# Patient Record
Sex: Female | Born: 1955
Health system: Southern US, Community
[De-identification: ages and names within clinical notes are randomized; demographics above are authoritative.]

## PROBLEM LIST (undated history)

## (undated) DIAGNOSIS — J449 Chronic obstructive pulmonary disease, unspecified: Secondary | ICD-10-CM

## (undated) DIAGNOSIS — J45909 Unspecified asthma, uncomplicated: Secondary | ICD-10-CM

## (undated) DIAGNOSIS — E039 Hypothyroidism, unspecified: Secondary | ICD-10-CM

## (undated) DIAGNOSIS — I2510 Atherosclerotic heart disease of native coronary artery without angina pectoris: Secondary | ICD-10-CM

## (undated) DIAGNOSIS — E785 Hyperlipidemia, unspecified: Secondary | ICD-10-CM

## (undated) DIAGNOSIS — N951 Menopausal and female climacteric states: Secondary | ICD-10-CM

## (undated) DIAGNOSIS — I10 Essential (primary) hypertension: Secondary | ICD-10-CM

## (undated) DIAGNOSIS — K219 Gastro-esophageal reflux disease without esophagitis: Secondary | ICD-10-CM

## (undated) DIAGNOSIS — K760 Fatty (change of) liver, not elsewhere classified: Secondary | ICD-10-CM

## (undated) DIAGNOSIS — R072 Precordial pain: Secondary | ICD-10-CM

## (undated) DIAGNOSIS — K589 Irritable bowel syndrome without diarrhea: Secondary | ICD-10-CM

## (undated) DIAGNOSIS — E119 Type 2 diabetes mellitus without complications: Secondary | ICD-10-CM

## (undated) HISTORY — DX: Fatty (change of) liver, not elsewhere classified: K76.0

## (undated) HISTORY — PX: SQUAMOUS CELL CARCINOMA EXCISION: SHX2433

## (undated) HISTORY — DX: Type 2 diabetes mellitus without complications: E11.9

## (undated) HISTORY — PX: NASAL SINUS SURGERY: SHX719

## (undated) HISTORY — PX: OTHER SURGICAL HISTORY: SHX169

## (undated) HISTORY — DX: Gastro-esophageal reflux disease without esophagitis: K21.9

## (undated) HISTORY — DX: Precordial pain: R07.2

## (undated) HISTORY — DX: Hyperlipidemia, unspecified: E78.5

## (undated) HISTORY — PX: ABLATION: SHX5711

## (undated) HISTORY — DX: Chronic obstructive pulmonary disease, unspecified: J44.9

## (undated) HISTORY — DX: Irritable bowel syndrome, unspecified: K58.9

## (undated) HISTORY — PX: TUBAL LIGATION: SHX77

## (undated) HISTORY — DX: Hypothyroidism, unspecified: E03.9

## (undated) HISTORY — DX: Menopausal and female climacteric states: N95.1

---

## 2006-09-30 ENCOUNTER — Encounter: Payer: Self-pay | Admitting: Family Medicine

## 2006-09-30 LAB — CONVERTED CEMR LAB
Cholesterol: 182 mg/dL
HDL: 41 mg/dL

## 2007-01-05 ENCOUNTER — Encounter: Payer: Self-pay | Admitting: Family Medicine

## 2007-01-05 HISTORY — PX: SPIROMETRY: SHX456

## 2007-01-05 LAB — CONVERTED CEMR LAB
AST: 16 units/L
BUN: 13 mg/dL
CO2: 23 meq/L
Calcium: 9.2 mg/dL
Glucose, Bld: 96 mg/dL
Potassium: 4.2 meq/L
Sodium: 141 meq/L
TSH: 1.355 microintl units/mL

## 2007-06-13 ENCOUNTER — Encounter: Admission: RE | Admit: 2007-06-13 | Discharge: 2007-06-13 | Payer: Self-pay | Admitting: Family Medicine

## 2007-06-13 ENCOUNTER — Ambulatory Visit: Payer: Self-pay | Admitting: Family Medicine

## 2007-06-13 DIAGNOSIS — N390 Urinary tract infection, site not specified: Secondary | ICD-10-CM

## 2007-06-13 DIAGNOSIS — E039 Hypothyroidism, unspecified: Secondary | ICD-10-CM

## 2007-06-13 HISTORY — DX: Hypothyroidism, unspecified: E03.9

## 2007-06-13 HISTORY — DX: Urinary tract infection, site not specified: N39.0

## 2007-06-13 LAB — CONVERTED CEMR LAB
Glucose, Urine, Semiquant: NEGATIVE
Ketones, urine, test strip: NEGATIVE
Nitrite: NEGATIVE
Specific Gravity, Urine: 1.01
WBC Urine, dipstick: NEGATIVE

## 2007-06-14 ENCOUNTER — Telehealth (INDEPENDENT_AMBULATORY_CARE_PROVIDER_SITE_OTHER): Payer: Self-pay | Admitting: *Deleted

## 2007-06-14 ENCOUNTER — Encounter: Payer: Self-pay | Admitting: Family Medicine

## 2007-06-18 ENCOUNTER — Encounter: Payer: Self-pay | Admitting: Family Medicine

## 2007-06-28 ENCOUNTER — Encounter: Payer: Self-pay | Admitting: Family Medicine

## 2007-06-28 LAB — CONVERTED CEMR LAB: TSH: 1.481 microintl units/mL (ref 0.350–5.50)

## 2007-06-29 ENCOUNTER — Telehealth (INDEPENDENT_AMBULATORY_CARE_PROVIDER_SITE_OTHER): Payer: Self-pay | Admitting: *Deleted

## 2007-08-01 ENCOUNTER — Ambulatory Visit: Payer: Self-pay | Admitting: Family Medicine

## 2007-08-01 ENCOUNTER — Encounter: Payer: Self-pay | Admitting: Family Medicine

## 2007-08-01 ENCOUNTER — Other Ambulatory Visit: Admission: RE | Admit: 2007-08-01 | Discharge: 2007-08-01 | Payer: Self-pay | Admitting: Family Medicine

## 2007-08-01 DIAGNOSIS — L719 Rosacea, unspecified: Secondary | ICD-10-CM

## 2007-08-01 HISTORY — DX: Rosacea, unspecified: L71.9

## 2007-08-06 ENCOUNTER — Encounter: Admission: RE | Admit: 2007-08-06 | Discharge: 2007-08-06 | Payer: Self-pay | Admitting: Family Medicine

## 2007-09-10 ENCOUNTER — Ambulatory Visit: Payer: Self-pay | Admitting: Family Medicine

## 2007-11-02 ENCOUNTER — Ambulatory Visit: Payer: Self-pay | Admitting: Family Medicine

## 2007-11-02 DIAGNOSIS — J449 Chronic obstructive pulmonary disease, unspecified: Secondary | ICD-10-CM

## 2007-11-02 DIAGNOSIS — K219 Gastro-esophageal reflux disease without esophagitis: Secondary | ICD-10-CM

## 2007-11-02 DIAGNOSIS — J441 Chronic obstructive pulmonary disease with (acute) exacerbation: Secondary | ICD-10-CM

## 2007-11-02 HISTORY — DX: Chronic obstructive pulmonary disease, unspecified: J44.9

## 2007-11-02 HISTORY — DX: Chronic obstructive pulmonary disease with (acute) exacerbation: J44.1

## 2007-11-02 HISTORY — DX: Gastro-esophageal reflux disease without esophagitis: K21.9

## 2008-02-04 ENCOUNTER — Ambulatory Visit: Payer: Self-pay | Admitting: Family Medicine

## 2008-02-05 ENCOUNTER — Encounter: Payer: Self-pay | Admitting: Family Medicine

## 2008-02-05 LAB — CONVERTED CEMR LAB
ALT: 32 units/L (ref 0–35)
AST: 23 units/L (ref 0–37)
BUN: 11 mg/dL (ref 6–23)
CO2: 23 meq/L (ref 19–32)
Creatinine, Ser: 0.75 mg/dL (ref 0.40–1.20)
HDL: 46 mg/dL (ref >39)
Sodium: 141 meq/L (ref 135–145)
TSH: 3.799 microintl units/mL (ref 0.350–5.50)
Triglycerides: 150 mg/dL — ABNORMAL HIGH (ref <150)

## 2008-02-25 ENCOUNTER — Ambulatory Visit: Payer: Self-pay | Admitting: Family Medicine

## 2008-02-25 DIAGNOSIS — J309 Allergic rhinitis, unspecified: Secondary | ICD-10-CM

## 2008-02-25 HISTORY — DX: Allergic rhinitis, unspecified: J30.9

## 2008-02-25 LAB — CONVERTED CEMR LAB
Bilirubin Urine: NEGATIVE
Glucose, Urine, Semiquant: NEGATIVE
Ketones, urine, test strip: NEGATIVE
Specific Gravity, Urine: <1.005
Urobilinogen, UA: 0.2

## 2008-03-10 ENCOUNTER — Encounter: Admission: RE | Admit: 2008-03-10 | Discharge: 2008-03-10 | Payer: Self-pay | Admitting: Family Medicine

## 2008-03-10 ENCOUNTER — Telehealth: Payer: Self-pay | Admitting: Family Medicine

## 2008-03-11 ENCOUNTER — Encounter: Payer: Self-pay | Admitting: Family Medicine

## 2008-03-15 ENCOUNTER — Encounter: Payer: Self-pay | Admitting: Family Medicine

## 2008-03-20 ENCOUNTER — Telehealth: Payer: Self-pay | Admitting: Family Medicine

## 2008-03-20 ENCOUNTER — Encounter: Admission: RE | Admit: 2008-03-20 | Discharge: 2008-03-20 | Payer: Self-pay | Admitting: Family Medicine

## 2008-03-31 ENCOUNTER — Encounter: Payer: Self-pay | Admitting: Family Medicine

## 2008-04-01 ENCOUNTER — Encounter: Payer: Self-pay | Admitting: Family Medicine

## 2008-04-01 LAB — CONVERTED CEMR LAB
Basophils Relative: 0 % (ref 0–1)
HCT: 44.1 % (ref 36.0–46.0)
Hemoglobin: 13.8 g/dL (ref 12.0–15.0)
Lymphocytes Relative: 29 % (ref 12–46)
Lymphs Abs: 2.6 10*3/uL (ref 0.7–4.0)
MCHC: 31.3 g/dL (ref 30.0–36.0)
MCV: 97.1 fL (ref 78.0–100.0)
Monocytes Absolute: 0.8 10*3/uL (ref 0.1–1.0)
Neutro Abs: 5.5 10*3/uL (ref 1.7–7.7)
Neutrophils Relative %: 60 % (ref 43–77)
Platelets: 303 10*3/uL (ref 150–400)
RBC: 4.54 M/uL (ref 3.87–5.11)
RDW: 13.8 % (ref 11.5–15.5)

## 2008-05-22 ENCOUNTER — Ambulatory Visit: Payer: Self-pay | Admitting: Family Medicine

## 2008-05-22 LAB — CONVERTED CEMR LAB
Ketones, urine, test strip: NEGATIVE
Nitrite: NEGATIVE
WBC Urine, dipstick: NEGATIVE

## 2008-05-23 ENCOUNTER — Encounter: Admission: RE | Admit: 2008-05-23 | Discharge: 2008-05-23 | Payer: Self-pay | Admitting: Family Medicine

## 2008-05-23 LAB — CONVERTED CEMR LAB
ALT: 47 units/L — ABNORMAL HIGH (ref 0–35)
AST: 27 units/L (ref 0–37)
Amylase: 42 units/L (ref 0–105)
BUN: 10 mg/dL (ref 6–23)
Basophils Absolute: 0 10*3/uL (ref 0.0–0.1)
Basophils Relative: 0 % (ref 0–1)
Creatinine, Ser: 0.8 mg/dL (ref 0.40–1.20)
Eosinophils Relative: 7 % — ABNORMAL HIGH (ref 0–5)
Glucose, Bld: 104 mg/dL — ABNORMAL HIGH (ref 70–99)
HCT: 45.7 % (ref 36.0–46.0)
Lymphocytes Relative: 29 % (ref 12–46)
Lymphs Abs: 2.7 10*3/uL (ref 0.7–4.0)
MCHC: 31.7 g/dL (ref 30.0–36.0)
MCV: 97.4 fL (ref 78.0–100.0)
Monocytes Absolute: 0.8 10*3/uL (ref 0.1–1.0)
Neutro Abs: 5.1 10*3/uL (ref 1.7–7.7)
Neutrophils Relative %: 55 % (ref 43–77)
Platelets: 271 10*3/uL (ref 150–400)
Potassium: 4.2 meq/L (ref 3.5–5.3)
RBC: 4.69 M/uL (ref 3.87–5.11)
RDW: 14 % (ref 11.5–15.5)
Total Bilirubin: 0.5 mg/dL (ref 0.3–1.2)
WBC: 9.2 10*3/uL (ref 4.0–10.5)

## 2008-05-26 ENCOUNTER — Telehealth: Payer: Self-pay | Admitting: Family Medicine

## 2008-05-27 ENCOUNTER — Encounter: Payer: Self-pay | Admitting: Family Medicine

## 2008-05-27 ENCOUNTER — Telehealth: Payer: Self-pay | Admitting: Family Medicine

## 2008-08-06 ENCOUNTER — Ambulatory Visit: Payer: Self-pay | Admitting: Family Medicine

## 2008-08-06 ENCOUNTER — Other Ambulatory Visit: Admission: RE | Admit: 2008-08-06 | Discharge: 2008-08-06 | Payer: Self-pay | Admitting: Family Medicine

## 2008-08-06 ENCOUNTER — Encounter: Payer: Self-pay | Admitting: Family Medicine

## 2008-08-06 DIAGNOSIS — N920 Excessive and frequent menstruation with regular cycle: Secondary | ICD-10-CM | POA: Insufficient documentation

## 2008-08-06 DIAGNOSIS — K7689 Other specified diseases of liver: Secondary | ICD-10-CM | POA: Insufficient documentation

## 2008-08-06 HISTORY — DX: Other specified diseases of liver: K76.89

## 2008-08-06 LAB — CONVERTED CEMR LAB
Bilirubin Urine: NEGATIVE
Blood in Urine, dipstick: NEGATIVE
Glucose, Urine, Semiquant: NEGATIVE
Protein, U semiquant: NEGATIVE
Specific Gravity, Urine: <1.005

## 2008-08-08 LAB — CONVERTED CEMR LAB: AST: 32 units/L (ref 0–37)

## 2008-10-06 ENCOUNTER — Ambulatory Visit: Payer: Self-pay | Admitting: Family Medicine

## 2008-11-26 ENCOUNTER — Ambulatory Visit: Payer: Self-pay | Admitting: Cardiology

## 2008-12-02 ENCOUNTER — Encounter: Admission: RE | Admit: 2008-12-02 | Discharge: 2008-12-02 | Payer: Self-pay | Admitting: Cardiology

## 2008-12-08 ENCOUNTER — Inpatient Hospital Stay (HOSPITAL_BASED_OUTPATIENT_CLINIC_OR_DEPARTMENT_OTHER): Admission: RE | Admit: 2008-12-08 | Discharge: 2008-12-08 | Payer: Self-pay | Admitting: Cardiology

## 2008-12-08 ENCOUNTER — Ambulatory Visit: Payer: Self-pay | Admitting: Cardiology

## 2008-12-24 ENCOUNTER — Encounter: Payer: Self-pay | Admitting: Cardiology

## 2008-12-24 ENCOUNTER — Ambulatory Visit: Payer: Self-pay | Admitting: Cardiology

## 2008-12-24 DIAGNOSIS — E7800 Pure hypercholesterolemia, unspecified: Secondary | ICD-10-CM | POA: Insufficient documentation

## 2008-12-24 DIAGNOSIS — I10 Essential (primary) hypertension: Secondary | ICD-10-CM | POA: Insufficient documentation

## 2008-12-24 HISTORY — DX: Essential (primary) hypertension: I10

## 2008-12-24 HISTORY — DX: Pure hypercholesterolemia, unspecified: E78.00

## 2009-05-13 ENCOUNTER — Ambulatory Visit: Payer: Self-pay | Admitting: Family Medicine

## 2009-05-13 DIAGNOSIS — N959 Unspecified menopausal and perimenopausal disorder: Secondary | ICD-10-CM | POA: Insufficient documentation

## 2009-05-13 HISTORY — DX: Unspecified menopausal and perimenopausal disorder: N95.9

## 2009-08-04 ENCOUNTER — Other Ambulatory Visit: Admission: RE | Admit: 2009-08-04 | Discharge: 2009-08-04 | Payer: Self-pay | Admitting: Family Medicine

## 2009-08-04 ENCOUNTER — Encounter: Payer: Self-pay | Admitting: Family Medicine

## 2009-08-04 ENCOUNTER — Ambulatory Visit: Payer: Self-pay | Admitting: Family Medicine

## 2009-08-05 ENCOUNTER — Encounter: Payer: Self-pay | Admitting: Family Medicine

## 2009-08-06 LAB — CONVERTED CEMR LAB
ALT: 32 units/L (ref 0–35)
Albumin: 4.2 g/dL (ref 3.5–5.2)
Alkaline Phosphatase: 58 units/L (ref 39–117)
BUN: 12 mg/dL (ref 6–23)
CO2: 21 meq/L (ref 19–32)
Calcium: 9.2 mg/dL (ref 8.4–10.5)
Chloride: 105 meq/L (ref 96–112)
Creatinine, Ser: 0.88 mg/dL (ref 0.40–1.20)
Glucose, Bld: 104 mg/dL — ABNORMAL HIGH (ref 70–99)
HDL: 49 mg/dL (ref >39)
LDL Cholesterol: 93 mg/dL (ref 0–99)
Potassium: 4.1 meq/L (ref 3.5–5.3)
TSH: 3.045 microintl units/mL (ref 0.350–4.500)
Total Protein: 6.5 g/dL (ref 6.0–8.3)
Triglycerides: 104 mg/dL (ref <150)

## 2009-08-11 ENCOUNTER — Encounter: Admission: RE | Admit: 2009-08-11 | Discharge: 2009-08-11 | Payer: Self-pay | Admitting: Family Medicine

## 2009-08-23 ENCOUNTER — Ambulatory Visit: Payer: Self-pay | Admitting: Family Medicine

## 2009-08-23 DIAGNOSIS — J069 Acute upper respiratory infection, unspecified: Secondary | ICD-10-CM | POA: Insufficient documentation

## 2009-09-08 ENCOUNTER — Telehealth: Payer: Self-pay | Admitting: Family Medicine

## 2010-01-18 ENCOUNTER — Ambulatory Visit: Payer: Self-pay | Admitting: Family Medicine

## 2010-01-18 DIAGNOSIS — J209 Acute bronchitis, unspecified: Secondary | ICD-10-CM | POA: Insufficient documentation

## 2010-01-18 DIAGNOSIS — R300 Dysuria: Secondary | ICD-10-CM

## 2010-01-18 HISTORY — DX: Acute bronchitis, unspecified: J20.9

## 2010-01-18 LAB — CONVERTED CEMR LAB
Glucose, Urine, Semiquant: 250
Nitrite: NEGATIVE
Protein, U semiquant: NEGATIVE
Urobilinogen, UA: 0.2

## 2010-01-19 ENCOUNTER — Encounter: Payer: Self-pay | Admitting: Family Medicine

## 2010-01-19 LAB — CONVERTED CEMR LAB
Clue Cells Wet Prep HPF POC: NONE SEEN
Trich, Wet Prep: NONE SEEN
Yeast Wet Prep HPF POC: NONE SEEN

## 2010-01-26 ENCOUNTER — Ambulatory Visit: Payer: Self-pay | Admitting: Obstetrics & Gynecology

## 2010-02-08 ENCOUNTER — Encounter: Payer: Self-pay | Admitting: Obstetrics & Gynecology

## 2010-02-08 ENCOUNTER — Encounter: Admission: RE | Admit: 2010-02-08 | Discharge: 2010-02-08 | Payer: Self-pay | Admitting: Obstetrics & Gynecology

## 2010-02-08 LAB — CONVERTED CEMR LAB
HCT: 41.9 % (ref 36.0–46.0)
MCHC: 32.5 g/dL (ref 30.0–36.0)
RBC: 4.36 M/uL (ref 3.87–5.11)
TSH: 2.81 microintl units/mL (ref 0.350–4.500)

## 2010-02-16 ENCOUNTER — Ambulatory Visit: Payer: Self-pay | Admitting: Obstetrics & Gynecology

## 2010-03-03 ENCOUNTER — Ambulatory Visit: Payer: Self-pay | Admitting: Obstetrics & Gynecology

## 2010-04-14 ENCOUNTER — Ambulatory Visit: Payer: Self-pay | Admitting: Obstetrics & Gynecology

## 2010-06-07 ENCOUNTER — Ambulatory Visit (HOSPITAL_COMMUNITY): Admission: RE | Admit: 2010-06-07 | Discharge: 2010-06-07 | Payer: Self-pay | Admitting: Obstetrics & Gynecology

## 2010-06-07 ENCOUNTER — Ambulatory Visit: Payer: Self-pay | Admitting: Obstetrics & Gynecology

## 2010-06-23 ENCOUNTER — Ambulatory Visit: Payer: Self-pay | Admitting: Obstetrics & Gynecology

## 2010-07-18 ENCOUNTER — Ambulatory Visit: Payer: Self-pay | Admitting: Emergency Medicine

## 2010-11-15 ENCOUNTER — Encounter: Payer: Self-pay | Admitting: Family Medicine

## 2010-11-23 NOTE — Assessment & Plan Note (Signed)
Summary: COLD/TM   Vital Signs:  Patient Profile:   55 Years Old Female CC:      Cold & URI symptoms Height:     64 inches Weight:      158 pounds O2 Sat:      100 % O2 treatment:    Room Air Temp:     97.8 degrees F oral Pulse rate:   73 / minute Pulse rhythm:   regular Resp:     16 per minute BP sitting:   123 / 82  (left arm) Cuff size:   regular  Vitals Entered By: Areta Haber CMA (July 18, 2010 2:55 PM)                  Current Allergies: ! BACTRIM ! * AVELOX ! CIPROHistory of Present Illness Chief Complaint: Cold & URI symptoms History of Present Illness: Patient complains of onset of cold symptoms for 8 days.  They have been using Mucinex-D which is helping a little bit but keeping awake.  Has a h/o COPD and asthma.  Also had surgery 2 weeks ago and was intubated so wonders if that is the cause. + sore throat + cough No pleuritic pain + wheezing + nasal congestion + post-nasal drainage No sinus pain/pressure No itchy/red eyes No earache No hemoptysis No SOB No chills/sweats No fever No nausea No vomiting No abdominal pain No diarrhea No skin rashes No fatigue No myalgias No headache   Current Problems: UPPER RESPIRATORY INFECTION, ACUTE (ICD-465.9) ACUTE BRONCHITIS (ICD-466.0) DYSURIA (ICD-788.1) URI (ICD-465.9) PERIMENOPAUSAL SYNDROME (ICD-627.9) HYPERTENSION, BENIGN (ICD-401.1) HYPERCHOLESTEROLEMIA, PURE (ICD-272.0) ISCHEMIC HEART DISEASE, FAMILY HX (ICD-V17.3) ROUTINE GYNECOLOGICAL EXAMINATION (ICD-V72.31) FATTY LIVER DISEASE (ICD-571.8) MENORRHAGIA (ICD-626.2) ALLERGIC RHINITIS (ICD-477.9) GERD (ICD-530.81) COPD, MILD (ICD-496) ROSACEA (ICD-695.3) UTI'S, RECURRENT (ICD-599.0) HYPOTHYROIDISM NOS (ICD-244.9)   Current Meds LEVOXYL 50 MCG  TABS (LEVOTHYROXINE SODIUM) Take 1 tablet by mouth once a day [BMN] PROAIR HFA 108 (90 BASE) MCG/ACT  AERS (ALBUTEROL SULFATE) 1-2 puffs q 6 hrs prn ASPIRIN 81 MG TBEC (ASPIRIN) Take one  tablet by mouth daily LISINOPRIL-HYDROCHLOROTHIAZIDE 10-12.5 MG TABS (LISINOPRIL-HYDROCHLOROTHIAZIDE) take 1 tab by mouth once daily APRI 0.15-30 MG-MCG TABS (DESOGESTREL-ETHINYL ESTRADIOL) 1 tab by mouth daily ZITHROMAX Z-PAK 250 MG TABS (AZITHROMYCIN) take as directed ZITHROMAX Z-PAK 250 MG TABS (AZITHROMYCIN) use as directed TESSALON 200 MG CAPS (BENZONATATE) 1 cap by mouth three times a day as needed for cough CLARITIN-D 24 HOUR 10-240 MG XR24H-TAB (LORATADINE-PSEUDOEPHEDRINE) 1 tab by mouth QAM for 2 wks DAYQUIL MULTI-SYMPTOM 30-325-10 MG/15ML LIQD (PSEUDOEPHEDRINE-APAP-DM) as directed ROBITUSSIN MAXIMUM STRENGTH 15 MG/5ML SYRP (DEXTROMETHORPHAN HBR) as directed MUCINEX D 703 [id number] MG XR12H-TAB (PSEUDOEPHEDRINE-GUAIFENESIN) as directed  REVIEW OF SYSTEMS Constitutional Symptoms      Denies fever, chills, night sweats, weight loss, weight gain, and fatigue.  Eyes       Denies change in vision, eye pain, eye discharge, glasses, contact lenses, and eye surgery. Ear/Nose/Throat/Mouth       Denies hearing loss/aids, change in hearing, ear pain, ear discharge, dizziness, frequent runny nose, frequent nose bleeds, sinus problems, sore throat, hoarseness, and tooth pain or bleeding.  Respiratory       Complains of productive cough and wheezing.      Denies dry cough, shortness of breath, asthma, bronchitis, and emphysema/COPD.      Comments: chest congestion Cardiovascular       Denies murmurs, chest pain, and tires easily with exhertion.    Gastrointestinal       Denies stomach pain, nausea/vomiting, diarrhea,  constipation, blood in bowel movements, and indigestion. Genitourniary       Denies painful urination, kidney stones, and loss of urinary control. Neurological       Denies paralysis, seizures, and fainting/blackouts. Musculoskeletal       Denies muscle pain, joint pain, joint stiffness, decreased range of motion, redness, swelling, muscle weakness, and gout.  Skin       Denies  bruising, unusual mles/lumps or sores, and hair/skin or nail changes.  Psych       Denies mood changes, temper/anger issues, anxiety/stress, speech problems, depression, and sleep problems. Other Comments: x 2 wks. pt has not seen PCP for this.   Past History:  Past Medical History: Last updated: 08/04/2009 GERD Perimenopausal G3P2 mild COPD, spirometery 3-08 hypothryoid since 2003 IBS fatty liver dz hyperlipidemia  Past Surgical History: Last updated: 06/18/2007 LTCS x 2 Spirometry 01/05/07: FVC 68% predicted; FEV1 44% predicted; FEV1/FVC 63% predicted = moderate obstruction with low vital capacity possibly due to restricion.  Family History: Last updated: 06/13/2007 sister Raynaud's father HTN, high chol, heart dz  Social History: Last updated: 05/13/2009 Divorced from [place].   Moved from [facility] in 2007. Children in [place] (2), 4 grandkids. Works in Photographer. Smokes 1/2 ppd x 30 yrs.-- quit 2009! Occas. ETOH Does not exercise.  Risk Factors: Caffeine Use: 3 (06/13/2007)  Risk Factors: Smoking Status: quit (02/04/2008) Packs/Day: .5 (08/01/2007) Physical Exam General appearance: well developed, well nourished, no acute distress Ears: normal, no lesions or deformities Nasal: mucosa pink, nonedematous, no septal deviation, turbinates normal Oral/Pharynx: clear PND Neck: neck supple,  trachea midline, no masses Chest/Lungs: no rales, wheezes, or rhonchi bilateral, breath sounds equal without effort Heart: regular rate and  rhythm, no murmur Skin: no obvious rashes or lesions MSE: oriented to time, place, and person Assessment New Problems: UPPER RESPIRATORY INFECTION, ACUTE (ICD-465.9)  Likely URI, but perhaps irritation from intubation 2 weeks ago.  Patient Education: Patient and/or caregiver instructed in the following: rest, fluids, Tylenol prn.  Plan New Medications/Changes: CLARITIN-D 24 HOUR 10-240 MG XR24H-TAB (LORATADINE-PSEUDOEPHEDRINE) 1 tab by mouth  QAM for 2 wks  #14 x 0, 07/18/2010, Hoyt Koch MD TESSALON 200 MG CAPS (BENZONATATE) 1 cap by mouth three times a day as needed for cough  #21 x 0, 07/18/2010, Hoyt Koch MD ZITHROMAX Z-PAK 250 MG TABS (AZITHROMYCIN) use as directed  #1 x 0, 07/18/2010, Hoyt Koch MD  New Orders: Est. Patient Level II [id number] Planning Comments:   Follow up with Dr. Cathey Endow later this week as scheduled.   The patient and/or caregiver has been counseled thoroughly with regard to medications prescribed including dosage, schedule, interactions, rationale for use, and possible side effects and they verbalize understanding.  Diagnoses and expected course of recovery discussed and will return if not improved as expected or if the condition worsens. Patient and/or caregiver verbalized understanding.  Prescriptions: CLARITIN-D 24 HOUR 10-240 MG XR24H-TAB (LORATADINE-PSEUDOEPHEDRINE) 1 tab by mouth QAM for 2 wks  #14 x 0   Entered and Authorized by:   Hoyt Koch MD   Signed by:   Hoyt Koch MD on 07/18/2010   Method used:   Print then Give to Patient   RxID:   [id number] TESSALON 200 MG CAPS (BENZONATATE) 1 cap by mouth three times a day as needed for cough  #21 x 0   Entered and Authorized by:   Hoyt Koch MD   Signed by:   Hoyt Koch MD on 07/18/2010   Method used:   Print  then Give to Patient   RxID:   [id number] ZITHROMAX Z-PAK 250 MG TABS (AZITHROMYCIN) use as directed  #1 x 0   Entered and Authorized by:   Hoyt Koch MD   Signed by:   Hoyt Koch MD on 07/18/2010   Method used:   Print then Give to Patient   RxID:   [id number]   Orders Added: 1)  Est. Patient Level II [70623]

## 2010-11-23 NOTE — Letter (Signed)
Summary: Out of Work  [facility]  [address]   [place], [place] [address]   Phone: [phone]  Fax: [phone]    January 18, 2010   Employee:  [name]    To Whom It May Concern:   For Medical reasons, please excuse the above named employee from work for the following dates:  Start:   March 28th  End:   March 29th  If you need additional information, please feel free to contact our office.         Sincerely,    Seymour Bars DO

## 2010-11-23 NOTE — Assessment & Plan Note (Signed)
Summary: bronchitis   Vital Signs:  Patient profile:   55 year old female Height:      64 inches Weight:      148 pounds BMI:     25.50 O2 Sat:      100 % on Room air Temp:     98.6 degrees F oral Pulse rate:   97 / minute BP sitting:   138 / 88  (left arm) Cuff size:   regular  Vitals Entered By: Payton Spark CMA (55 year old female Height:  O2 Flow:  Room air CC: HA, congestion, ? sinus infection x 4 days. Also c/o ? UTI or yeast infection.    Primary Care Provider:  Seymour Bars DO  CC:  HA, congestion, and ? sinus infection x 4 days. Also c/o ? UTI or yeast infection. Marland Kitchen  History of Present Illness: 55 yo WF presents for 5 days of sore throat, cough and head congestion.  She is taking Nyquil and Dayquil which is helping.  She has not had fevers or chills.  Her cough did keep her up at night.  She has been using her inhaler for SOB.  She is getting up green phlegm.  She has copious green rhinorrhea.   She is getting bad HAs.  Sick contacts at work.    Denies GI upset.  She is having periods q 2 wks and they are fairly heavy even with BCPs.  She had an u/s done a couple years ago and it was normal.  She has had 3 days of Dysuria after a recent long car trip to [place] to visit her stepfather who is dying of cancer.     Current Medications (verified): 1)  Levoxyl 50 Mcg  Tabs (Levothyroxine Sodium) .... Take 1 Tablet By Mouth Once A Day 2)  Proair Hfa 108 (90 Base) Mcg/act  Aers (Albuterol Sulfate) .Marland Kitchen.. 1-2 Puffs Q 6 Hrs Prn 3)  Aspirin 81 Mg Tbec (Aspirin) .... Take One Tablet By Mouth Daily 4)  Lisinopril-Hydrochlorothiazide 10-12.5 Mg Tabs (Lisinopril-Hydrochlorothiazide) .... Take 1 Tab By Mouth Once Daily 5)  Apri 0.15-30 Mg-Mcg Tabs (Desogestrel-Ethinyl Estradiol) .Marland Kitchen.. 1 Tab By Mouth Daily  Allergies (verified): 1)  ! Bactrim 2)  ! * Avelox 3)  ! Cipro  Past History:  Past Medical History: Reviewed history from 08/04/2009 and no changes  required. GERD Perimenopausal G3P2 mild COPD, spirometery 3-08 hypothryoid since 2003 IBS fatty liver dz hyperlipidemia  Past Surgical History: Reviewed history from 06/18/2007 and no changes required. LTCS x 2 Spirometry 01/05/07: FVC 68% predicted; FEV1 44% predicted; FEV1/FVC 63% predicted = moderate obstruction with low vital capacity possibly due to restricion.  Family History: Reviewed history from 06/13/2007 and no changes required. sister Raynaud's father HTN, high chol, heart dz  Social History: Reviewed history from 05/13/2009 and no changes required. Divorced from [place].   Moved from [facility] in 2007. Children in [place] (2), 4 grandkids. Works in Photographer. Smokes 1/2 ppd x 30 yrs.-- quit 2009! Occas. ETOH Does not exercise.  Review of Systems      See HPI  Physical Exam  General:  alert, well-developed, well-nourished, and well-hydrated.   Head:  normocephalic and atraumatic.   Eyes:  conjunctiva clear Nose:  nasal congestion present Mouth:  o/p pink and moist Neck:  no masses.   Lungs:  dry hacking cough with scattered rhonchi.  good air movement, nonlabored.  No exp wheezing Heart:  RRR w/o M Extremities:  no E/C/C Skin:  color normal.  Cervical Nodes:  No lymphadenopathy noted   Impression & Recommendations:  Problem # 1:  ACUTE BRONCHITIS (ICD-466.0) Former smoker with reassuring SaO2.  Treat with zithromax x 5 days.  Continue Dayquil and Nyquil.  Out of work note given for tomorrow. Call if not improved in 10 days.   The following medications were removed from the medication list:    Amoxicillin 875 Mg Tabs (Amoxicillin) ..... One by mouth two times a day    Tussionex Pennkinetic Er 8-10 Mg/77ml Lqcr (Chlorpheniramine-hydrocodone) .Marland KitchenMarland KitchenMarland KitchenMarland Kitchen 5 cc by mouth hs as needed cough Her updated medication list for this problem includes:    Proair Hfa 108 (90 Base) Mcg/act Aers (Albuterol sulfate) .Marland Kitchen... 1-2 puffs q 6 hrs prn    Zithromax Z-pak 250 Mg Tabs  (Azithromycin) .Marland Kitchen... Take as directed  Problem # 2:  DYSURIA (ICD-788.1) UA normal.  Wet prep pending.   The following medications were removed from the medication list:    Amoxicillin 875 Mg Tabs (Amoxicillin) ..... One by mouth two times a day Her updated medication list for this problem includes:    Zithromax Z-pak 250 Mg Tabs (Azithromycin) .Marland Kitchen... Take as directed  Orders: T-Wet Prep for Christoper Allegra, [facility] [phone])  Complete Medication List: 1)  Levoxyl 50 Mcg Tabs (Levothyroxine sodium) .... Take 1 tablet by mouth once a day 2)  Proair Hfa 108 (90 Base) Mcg/act Aers (Albuterol sulfate) .Marland Kitchen.. 1-2 puffs q 6 hrs prn 3)  Aspirin 81 Mg Tbec (Aspirin) .... Take one tablet by mouth daily 4)  Lisinopril-hydrochlorothiazide 10-12.5 Mg Tabs (Lisinopril-hydrochlorothiazide) .... Take 1 tab by mouth once daily 5)  Apri 0.15-30 Mg-mcg Tabs (Desogestrel-ethinyl estradiol) .Marland Kitchen.. 1 tab by mouth daily 6)  Zithromax Z-pak 250 Mg Tabs (Azithromycin) .... Take as directed  Other Orders: UA Dipstick w/o Micro (automated)  (81003) Gynecologic Referral (Gyn)  Patient Instructions: 1)  Take Zithromax x 5 days for bronchitis. 2)  Use inhaler as needed. 3)  Dayquil and Nyquil will help symptomatic relief. 4)  Clear fluids, rest. 5)  Call if not improved in 10 days. 6)  Return for f/u visit in 4 mos. 7)  I will get you into Dr Penne Lash down the hall for heavy bleeding.   Prescriptions: ZITHROMAX Z-PAK 250 MG TABS (AZITHROMYCIN) take as directed  #1 pack x 0   Entered and Authorized by:   Seymour Bars DO   Signed by:   Seymour Bars DO on 01/18/2010   Method used:   Electronically to        [facility] [phone]* (retail)       [address] [place], [place]  [address]       Ph: [phone]       Fax: [phone]   RxID:   [id number]   Laboratory Results   Urine Tests    Routine Urinalysis   Color: yellow Appearance: Clear Glucose: 250   (Normal Range:  Negative) Bilirubin: negative   (Normal Range: Negative) Ketone: negative   (Normal Range: Negative) Spec. Gravity: 1.015   (Normal Range: 1.003-1.035) Blood: trace-intact   (Normal Range: Negative) pH: 6.0   (Normal Range: 5.0-8.0) Protein: negative   (Normal Range: Negative) Urobilinogen: 0.2   (Normal Range: 0-1) Nitrite: negative   (Normal Range: Negative) Leukocyte Esterace: negative   (Normal Range: Negative)

## 2011-01-07 LAB — CBC
Hemoglobin: 14.9 g/dL (ref 12.0–15.0)
MCH: 33.1 pg (ref 26.0–34.0)
MCHC: 34.2 g/dL (ref 30.0–36.0)
Platelets: 303 10*3/uL (ref 150–400)
RBC: 4.49 MIL/uL (ref 3.87–5.11)
RDW: 13.6 % (ref 11.5–15.5)
WBC: 9.2 10*3/uL (ref 4.0–10.5)

## 2011-01-07 LAB — COMPREHENSIVE METABOLIC PANEL
ALT: 45 U/L — ABNORMAL HIGH (ref 0–35)
GFR calc non Af Amer: >60 mL/min (ref >60)
Sodium: 140 mEq/L (ref 135–145)
Total Bilirubin: 0.4 mg/dL (ref 0.3–1.2)
Total Protein: 7.3 g/dL (ref 6.0–8.3)

## 2011-01-07 LAB — PREGNANCY, URINE: Preg Test, Ur: NEGATIVE

## 2011-02-08 ENCOUNTER — Other Ambulatory Visit: Payer: Self-pay | Admitting: Family Medicine

## 2011-02-08 DIAGNOSIS — Z1231 Encounter for screening mammogram for malignant neoplasm of breast: Secondary | ICD-10-CM

## 2011-02-15 ENCOUNTER — Ambulatory Visit
Admission: RE | Admit: 2011-02-15 | Discharge: 2011-02-15 | Disposition: A | Discharge status: Home / Self Care | Payer: BC Managed Care – PPO | Source: Ambulatory Visit | Attending: Family Medicine | Admitting: Family Medicine

## 2011-02-15 DIAGNOSIS — Z1231 Encounter for screening mammogram for malignant neoplasm of breast: Secondary | ICD-10-CM

## 2011-03-08 NOTE — Cardiovascular Report (Signed)
Stacy [name] NO.:  [id number]   MEDICAL RECORD NO.:  [id number]          PATIENT TYPE:  OIB   LOCATION:  1961                         FACILITY:  [facility]   PHYSICIAN:  Stacy Ancona, MD      DATE OF BIRTH:  20-Mar-1956   DATE OF PROCEDURE:  DATE OF DISCHARGE:  12/08/2008                            CARDIAC CATHETERIZATION   PRIMARY CARDIOLOGIST:  Stacy Frieze. Jens Som, MD, [facility]   HISTORY OF PRESENT ILLNESS:  This is a 55 year old with a history of  borderline hypertension and hyperlipidemia who has had episodes of  exertional chest pain and was sent to the [facility] for outpatient cardiac  catheterization.   PROCEDURES:  1. Left heart catheterization.  2. Coronary angiography.  3. Left ventriculography.   PROCEDURE NOTE:  After informed consent was obtained, the patient was  sterilely prepped and draped.  The right groin area was locally  anesthetized using 1% lidocaine.  The right common femoral artery was  entered using Seldinger technique and a 4-French arterial sheath was  placed.  The left coronary artery was engaged using the 4-French JL4  catheter.  The right coronary artery was engaged using the 4-French 3DRC  catheter, and the ventricle was entered using the 4-French angled  pigtail catheter.  The right coronary artery was small, nondominant.  With injection of the right coronary artery, there was a brief run of  nonsustained ventricular tachycardia that ended quickly with no  complications.   FINDINGS:  1. Hemodynamics:  LV 143/17/21, aorta 136/59.  2. Left ventriculography:  There were no regional wall motion      abnormalities.  EF was estimated at 60%.  There was no significant      mitral regurgitation.  3. Coronary angiography:  There was no significant coronary artery      disease.  There were minor luminal irregularities, mainly in the      circumflex.  The right coronary artery was a small, nondominant      vessel.  The left circumflex and  LAD were both large vessels.  The      left circumflex provided the PDA.   ASSESSMENT/PLAN:  This is a 55 year old who presents with probable  noncoronary chest pain.  She has no significant coronary disease on left  heart catheterization.  Her left ventricular end-diastolic pressure is  mildly elevated, suggesting the presence of some diastolic dysfunction.  We will continue to treat her coronary artery disease risk factors  aggressively.      Stacy Ancona, MD  Electronically Signed     DM/MEDQ  D:  12/08/2008  T:  12/08/2008  Job:  [id number]   cc:   Stacy Frieze. Jens Som, MD, [facility]

## 2011-03-08 NOTE — Assessment & Plan Note (Signed)
[facility]                            CARDIOLOGY OFFICE NOTE   NEASIA, FLEEMAN                         MRN:          [id number]  DATE:11/26/2008                            DOB:          11-13-1955    Stacy [name] is a pleasant 55 year old female, who I am asked to evaluate  for chest pain.  She did state that in 2002 in [place], she was seen in the  [facility] secondary to an episode of chest pain.  She had what  sounds to be a stress test that was unremarkable, but those records are  not available at this time.  Over the past 6 months, the patient has had  intermittent chest heaviness.  This typically occurs with exertion or  with stress.  It is relieved with rest.  It does not occur at rest.  The pain is substernal and does not radiate.  It is not pleuritic in  positional.  There is no associated nausea, vomiting, shortness of  breath, or diaphoresis.  She also had an episode of left upper extremity  pain on Thanksgiving day.  This occurred at rest and was described as a  numb sensation.  It lasted for approximately 2 hours and resolved  spontaneously.  This pain again did not radiate.  There was no  associated symptoms.  Because of the above, we were asked to further  evaluate.  Note, she has not had any significant chest pain in the past  month.  She does have dyspnea on exertion, but attributes this to COPD.  There is no orthopnea, PND, or pedal edema.  There has been no syncope.  She occasionally has palpitations at night.   PRESENT MEDICATIONS:  1. Nexium 40 mg p.o. daily.  2. Levoxyl 50 mcg p.o. daily.  3. Loestrin.   She has an allergy to CIPROFLOXACIN, BACTRIM, and AVELOX.   FAMILY HISTORY:  Strongly positive for coronary disease.  Her father had  coronary artery bypassing graft in his 30s.  She also states she has a  brother, who has had a pacemaker and blockage as well.   SOCIAL HISTORY:  She has remote history of tobacco use,  but has not  smoked in the past 1 year.  She rarely consumes alcohol.   PAST MEDICAL HISTORY:  Significant for borderline hypertension and  borderline hyperlipidemia.  There is no diabetes mellitus.  She does  have hypothyroidism.  She has a history of COPD.  She has a history of  gastroesophageal reflux disease as well.  She has had a previous  cesarean section.  There is no other significant past medical history  noted.   REVIEW OF SYSTEMS:  There is no headaches, fevers, or chills.  There is  no productive cough or hemoptysis.  No dysphagia, odynophagia, melena,  or hematochezia.  There is no dysuria or hematuria.  There is no rash or  seizure activity.  There is no orthopnea, PND, or pedal edema.  There is  no claudication noted.  She does have some cramping in legs at night.  The remaining  systems are negative.   PHYSICAL EXAMINATION:  VITAL SIGNS:  Blood pressure of 139/91 and pulse  is 82.  She weighs 149 pounds.  GENERAL:  She is well-developed and well-nourished and in no distress.  SKIN:  Warm and dry.  She does not appear to be depressed.  There is no  peripheral clubbing.  BACK:  Normal.  HEENT:  Normal with normal eyelids.  NECK:  Supple.  Normal upstroke bilaterally.  No bruits noted.  There is  no jugular venous distention.  There is no thyromegaly.  CHEST:  Clear  to auscultation.  There is no expansion.  CARDIOVASCULAR:  Regular rate and rhythm.  Normal S1 and S2.  There are  no murmurs, rubs or gallops noted.  ABDOMEN:  No tenderness and positive bowel sounds.  No  hepatosplenomegaly.  No masses are appreciated.  There is no abdominal  bruit.  EXTREMITIES:  There is 2+ femoral pulses bilaterally.  No bruits.  No  edema.  I can palpate no cords.  She has 2+ dorsalis pedis pulses  bilaterally.  NEUROLOGIC:  Grossly intact.   She did have an electrocardiogram dated on August 06, 2008 that showed  her sinus rhythm at a rate of approximately 75.  There are no ST  changes  noted.   DIAGNOSES:  1. Chest pain - Stacy [name] has had 2 separate types of chest pain.      The arm pain that she describes from Thanksgiving Day, sounds as      though it may be musculoskeletal.  However, she also describes a      chest heaviness that occurs with exertion.  It is relieved with      rest.  It has been present for the past 6 months.  It sounds much      more concerning for coronary artery disease.  She also has multiple      risk factors including a strong family history, remote tobacco      abuse, borderline hypertension, and borderline hyperlipidemia.  We      discussed the options today including a stress test versus      catheterization.  However, given her risk factors in the      description of her symptoms, I think definitive evaluation is      warranted.  We discussed the risk and benefits of cardiac      catheterization and she agrees to proceed.  We will arrange this as      an outpatient.  We will plan to repeat her electrocardiogram today      as well.  2. Hypertension - her blood pressure appears to be borderline.  I have      asked her to track this at home.  We will add additional      medications as indicated.  Also, given her chest pain, I have      instructed her to take enteric-coated aspirin 81 mg p.o. daily.  3. History of borderline hyperlipidemia - her coronary artery disease      is demonstrated, then she will need a statin.  4. History of chronic obstructive pulmonary disease.  5. Remote history of tobacco abuse.  6. Gastroesophageal disease - she will continue on her Nexium.  7. History of hypothyroidism - she will continue on her Levoxyl.   We will have her return in 4 weeks after we have a catheterization  performed.     Madolyn Frieze Jens Som, MD, [facility]  Electronically  Signed    BSC/MedQ  DD: 11/26/2008  DT: 11/27/2008  Job #: [id number]   cc:   Seymour Bars, D.O.

## 2011-03-08 NOTE — Assessment & Plan Note (Signed)
Stacy [name], Stacy [name] NO.:  [id number]   MEDICAL RECORD NO.:  [id number]          PATIENT TYPE:  POB   LOCATION:  [facility] [place]         FACILITY:  [facility]   PHYSICIAN:  Elsie Lincoln, MD      DATE OF BIRTH:  23-Jul-1956   DATE OF SERVICE:  01/26/2010                                  CLINIC NOTE   HISTORY OF PRESENT ILLNESS:  The patient is a 55 year old female who  presents for menorrhagia.  The patient has had heavy periods in her  entire life.  She has had episodes where she has soaked through her  pants and underwear and embarrass herself in public.  She stayed at home  the first of her period because she soaks pads every hour, sometimes  asked to wear tampons and pads.  She also has some clots.  The patient  doubt she is perimenopausal, but it seems to be getting worse.  She also  has problems where she bleeds several times twice in a month.  I  explained her this is a normal way to rule out malignancy, hyperplasia,  polyp, and submucosal fibroids.  The patient is also on a birth control  pill by family practice doctor.  I am going to remove her from that  given that she is hypertensive in 53 and also that we do not have a  tissue diagnosis.  The patient understands.   PAST MEDICAL HISTORY:  Cyst in her kidney, reflux, pneumonia, high blood  pressure, hypothyroidism, and the patient needs a level drawn.   PAST SURGICAL HISTORY:  C-section x2, tubal ligation.   OBSTETRICAL HISTORY:  C-section x2, miscarriage x1, a D and C in her 44s  from heavy bleeding.  No history of abnormal Pap smear.  No history of  endometriosis, fibroids cyst, fibroid tumors, ovarian cyst, or sexually  transmitted diseases.  She is sexually active.  The patient does have  urinary incontinence.  She does have urge and stress symptoms.   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  She does  not smoke, drink alcohol, or use drugs.  She has never been sexually or  physically abused.   FAMILY HISTORY:  Positive for diabetes in her grandmother.  Heart  disease in grandmother and father and high blood pressure in her father.   MEDICATIONS:  ProAir inhaler, Loestrin Fe 24, Levoxyl, and lisinopril.   SYSTEMIC REVIEW:  Positive for hot flashes, menopausal symptoms, cough,  swelling in legs and hands, leg cramps, diarrhea, frequent urination,  unable to hold urine, vaginal discharge.   PHYSICAL EXAMINATION:  VITAL SIGNS:  Pulse 74, blood pressure 110/75,  and weight 144.  GENERAL:  Well nourished, well developed, no apparent distress.  HEENT:  Normocephalic and atraumatic.  CHEST:  Normal movement.  ABDOMEN:  Soft, nontender.  No organomegaly.  No rebound or guarding.  GENITALIA:  Tanner V.  Vagina is pale and pink.  Cervix is closed and  nontender.  Uterus is retroverted, normal size.  Adnexa, no masses,  nontender.  Uterus is mobile.  EXTREMITIES:  Nontender.   ASSESSMENT AND PLAN:  A 55 year old female with abnormal uterine  bleeding.  1. Stop Loestrin Fe 24.  The patient is due for her period.  We will      start on Lysteda, sample given, and the prescription card given.      This will help her with her heavy bleeding this period.  After this      period, we will start on Provera daily for 3 weeks to hopefully      help her having next period be not so heavy while we complete her      testing.  2. Transvaginal ultrasound ordered.  3. The patient to come back for endometrial biopsy.  4. Referral to Urology for evaluation of incontinence for urodynamics.  5. The patient needs CBC and TSH and FSH drawn.  These have been not      ordered today.  We will order them at the next visit.           ______________________________  Elsie Lincoln, MD     KL/MEDQ  D:  01/26/2010  T:  01/27/2010  Job:  [id number]

## 2011-03-08 NOTE — Assessment & Plan Note (Signed)
Stacy [name], MEADOW NO.:  [id number]   MEDICAL RECORD NO.:  [id number]          PATIENT TYPE:  POB   LOCATION:  [facility] [place]         FACILITY:  [facility]   PHYSICIAN:  Elsie Lincoln, MD      DATE OF BIRTH:  1956-05-19   DATE OF SERVICE:  03/03/2010                                  CLINIC NOTE   The patient is a 55 year old female who presents after endometrial  biopsy.  Her pathology says most likely endometrial polyp.  I have  explained why she has intermittent heavy bleeds, explained the patient  that it is best to get the polyp out as there could be atypical cells of  malignancy associated with the polyp not shown on biopsy and also  getting the polyp out will help with her periods and heavy bleeding.  The patient agrees.  We discussed doing a D and C, hysteroscopy,  polypectomy, and Hydrothermal ablation.  We discussed the risk of this  procedure to include, but not limited to bleeding, infection, damage to  the back of the uterus, perforation and burn in the vagina.  The patient  wishes for hysterectomy and bilateral salpingo-oophorectomy if there is  a perforation and bleeding.  She would like to go ahead and proceed with  hysterectomy if there happens to be this complication.  I told her that  complication is extremely rare, most likely this will not happen, but  again she stated that she would like a hysterectomy if there is a  perforation and needs to be any excoriation.  I agreed to do this if  there happens to be a perforation.  The patient has a history of high  blood pressure and fatty liver disease.  We will draw a CBC and CMET on  her preop visit.  These are supposed to be under control.  Per the  patient, we will get a letter of medical clearance from her doctor who  was also seen in this building.  The patient has appointment with Dr.  Jacquelyne Balint in May for urinary incontinence.  I will wait for this note to  see if we will do a concomitant  procedure which only requires 1  anesthesia.           ______________________________  Elsie Lincoln, MD     KL/MEDQ  D:  03/03/2010  T:  03/03/2010  Job:  [id number]

## 2011-03-08 NOTE — Assessment & Plan Note (Signed)
NAME:  Stacy [name], Stacy [name] NO.:  [id number]   MEDICAL RECORD NO.:  [id number]          PATIENT TYPE:  POB   LOCATION:  [facility] [place]         FACILITY:  [facility]   PHYSICIAN:  Elsie Lincoln, MD      DATE OF BIRTH:  [birth year]   DATE OF SERVICE:  02/16/2010                                  CLINIC NOTE   The patient is a 55 year old female who presents for endometrial biopsy.  The patient states she has very heavy periods for all of her life,  sometimes twice a month.  She has several tests done since her last  visit.  Her sonogram showed uterus of normal size, measuring 7.7 x 4.2 x  4.8 with an endometrial stripe 7 mm with average normal appearance.  There is no free fluid.  She had several labs drawn.  Her hemoglobin was  13.6 and platelets 332.  Her TSH was 2.81, FSH 36.8.  She was also on  Loestrin FE 24 when this was drawn.  She did try Lysteda for 1 day on  day 3 of her period, but she felt like that it was worse, so she  stopped.  She is on the Provera now.  She is due for her period in about  a week and a half.  Today, again she presents for endometrial biopsy.  UBT was negative for the procedure.  After informed consent was  obtained, the patient was placed in dorsal lithotomy position and  speculum was placed into the vagina.  The cervix was cleaned with  Betadine.  The Pipelle was placed easily into the cervical os.  The  uterus sounded approximately 7.5 cm.  Two passes with that Pipelle were  obtained with very scant material obtained.  The patient tolerated the  procedure well and there was no bleeding after the procedure.   ASSESSMENT AND PLAN:  A 55 year old female with heavy periods.  1. The patient's hemoglobin is 13.6.  Fortunately, whatever bleeding      the patient is doing is not causing anemia.  The patient is also      relieved by this.  The patient, I think, is near menopause given      her [facility] is 41.  Her vagina is also pale pink, which shows  evidence      of decreased estrogen.  The patient will try the Provera only to      hopefully control her bleeding.  She may add Lysteda in.  She will      come back in 2 months after trying these drugs and let me know if      this works for her.  If it does not work, she will consider an      ablation.  2. The patient has an appointment with Dr. Perley Jain in May for her      urinary incontinence.  3. The patient sees Dr. Cathey Endow for healthcare maintenance, but she is      thinking of changing to [place] for her Pap smears and mammogram.  She      has not done this yet.  ______________________________  Elsie Lincoln, MD     KL/MEDQ  D:  02/16/2010  T:  02/17/2010  Job:  [id number]

## 2011-03-08 NOTE — Assessment & Plan Note (Signed)
NAME:  Stacy [name], Stacy [name]                 ACCOUNT NO.:  [id number]   MEDICAL RECORD NO.:  [id number]          PATIENT TYPE:  POB   LOCATION:  [facility] [place]         FACILITY:  [facility]   PHYSICIAN:  Elsie Lincoln, MD      DATE OF BIRTH:  [birth year]   DATE OF SERVICE:                                  CLINIC NOTE   The patient is a 55 year old female who presents status post D and C  hysteroscopy and hydrothermal ablation approximately 2 weeks ago.  The  patient had an uncomplicated course.  Pathology showed proliferative-  type endometrium with breakdown with no evidence of hyperplasia or  malignancy.  The patient did extremely well postoperatively.  She had  discharge from the vagina, but not excessive amount.  She has not had  any bleeding.  She had very minimal postoperative pain if any.  The  patient is having some hot flashes and I instructed her to go and try  hormonal therapy.  We briefly reviewed the risks and she also considered  going to [facility].  We also talked about  bioidentical hormones.   PHYSICAL EXAMINATION:  VITAL SIGNS:  Pulse 85, blood pressure 112/77,  weight 155, height 64 inches.  GENERAL:  Well nourished, well developed, no apparent distress.  HEENT:  Normocephalic, atraumatic.  CHEST:  Normal movement.  ABDOMEN:  Soft, nontender.  GENITALIA:  Tanner V.  Vagina pink, normal rugae.  Cervix is closed,  nontender.  Uterus; retroverted, nontender.   ASSESSMENT AND PLAN:  A 55 year old female who is status post D and C  hysteroscopy and hydrothermal ablation, doing well.  The patient is due  for well-woman exam in October or November.  This will be the first time  she has gotten her Pap smear and mammogram with me, so I will see her  then.           ______________________________  Elsie Lincoln, MD     KL/MEDQ  D:  06/23/2010  T:  06/24/2010  Job:  [id number]

## 2011-03-31 ENCOUNTER — Other Ambulatory Visit: Payer: Self-pay | Admitting: Family Medicine

## 2011-04-03 ENCOUNTER — Other Ambulatory Visit: Payer: Self-pay | Admitting: Cardiology

## 2011-04-05 ENCOUNTER — Other Ambulatory Visit: Payer: Self-pay | Admitting: Gastroenterology

## 2011-06-14 ENCOUNTER — Encounter: Payer: Self-pay | Admitting: Cardiology

## 2012-09-13 ENCOUNTER — Other Ambulatory Visit: Payer: Self-pay | Admitting: Family Medicine

## 2012-09-13 ENCOUNTER — Other Ambulatory Visit (HOSPITAL_COMMUNITY): Payer: BC Managed Care – PPO

## 2012-09-13 DIAGNOSIS — R1013 Epigastric pain: Secondary | ICD-10-CM

## 2012-09-13 DIAGNOSIS — R109 Unspecified abdominal pain: Secondary | ICD-10-CM

## 2012-09-14 ENCOUNTER — Ambulatory Visit (HOSPITAL_COMMUNITY)
Admission: RE | Admit: 2012-09-14 | Discharge: 2012-09-14 | Disposition: A | Discharge status: Home / Self Care | Payer: BC Managed Care – PPO | Source: Ambulatory Visit | Attending: Family Medicine | Admitting: Family Medicine

## 2012-09-14 DIAGNOSIS — K7689 Other specified diseases of liver: Secondary | ICD-10-CM | POA: Insufficient documentation

## 2012-09-14 DIAGNOSIS — R1013 Epigastric pain: Secondary | ICD-10-CM | POA: Insufficient documentation

## 2012-09-17 ENCOUNTER — Other Ambulatory Visit: Payer: Self-pay | Admitting: Family Medicine

## 2012-09-17 DIAGNOSIS — R1011 Right upper quadrant pain: Secondary | ICD-10-CM

## 2012-09-25 ENCOUNTER — Encounter (HOSPITAL_COMMUNITY): Payer: Self-pay

## 2012-09-25 ENCOUNTER — Encounter (HOSPITAL_COMMUNITY)
Admission: RE | Admit: 2012-09-25 | Discharge: 2012-09-25 | Disposition: A | Discharge status: Home / Self Care | Payer: BC Managed Care – PPO | Source: Ambulatory Visit | Attending: Family Medicine | Admitting: Family Medicine

## 2012-09-25 DIAGNOSIS — R1011 Right upper quadrant pain: Secondary | ICD-10-CM | POA: Insufficient documentation

## 2012-09-25 HISTORY — DX: Unspecified asthma, uncomplicated: J45.909

## 2012-09-25 HISTORY — DX: Essential (primary) hypertension: I10

## 2012-09-25 MED ORDER — TECHNETIUM TC 99M MEBROFENIN IV KIT
5.0000 | PACK | Freq: Once | INTRAVENOUS | Status: AC | PRN
  Administered 2012-09-25: 5.2 via INTRAVENOUS

## 2013-03-11 ENCOUNTER — Telehealth: Payer: Self-pay | Admitting: Family Medicine

## 2013-03-11 ENCOUNTER — Encounter: Payer: Self-pay | Admitting: Family Medicine

## 2013-03-11 ENCOUNTER — Ambulatory Visit (INDEPENDENT_AMBULATORY_CARE_PROVIDER_SITE_OTHER): Payer: BC Managed Care – PPO | Admitting: Family Medicine

## 2013-03-11 VITALS — BP 109/72 | HR 61 | Temp 97.6°F | Ht 63.0 in | Wt 159.2 lb

## 2013-03-11 DIAGNOSIS — J329 Chronic sinusitis, unspecified: Secondary | ICD-10-CM

## 2013-03-11 MED ORDER — AMOXICILLIN-POT CLAVULANATE 875-125 MG PO TABS: 1.0000 | ORAL_TABLET | Freq: Two times a day (BID) | ORAL | Status: DC

## 2013-03-11 NOTE — Progress Notes (Signed)
  Subjective:    Patient ID: Stacy [name], female    DOB: 1955-12-15, 57 y.o.   MRN: [id number]  HPI  Patient was seen in urgent care about 3 weeks ago with a diagnosis of eustachian tube dysfunction. She took a course of prednisone and this seemed to help her ear pain and congestion. Last week upon completion of the prednisone the ear pain and discomfort came back. If she is having fever she is not certain of that. She's had increased sinus congestion and drainage for the past 2 days. She is also having headaches.. she denies sore throat and cough.   Review of Systems  HENT: Positive for ear pain (bilateral), congestion and sinus pressure. Negative for sore throat.   Neurological: Positive for headaches.       Objective:   Physical Exam  Vitals reviewed. Constitutional: She is oriented to person, place, and time. She appears well-developed and well-nourished. No distress.  HENT:  Head: Normocephalic and atraumatic.  Right Ear: External ear normal.  Left Ear: External ear normal.  Mouth/Throat: Oropharynx is clear and moist. No oropharyngeal exudate.  Nasal turbinate swelling and congestion bilateral  Eyes: Conjunctivae are normal. Right eye exhibits no discharge. Left eye exhibits no discharge.  Neck: Normal range of motion. Neck supple. No thyromegaly present.  Slight anterior cervical tenderness on the left, no adenopathy was appreciated  Cardiovascular: Normal rate and regular rhythm.   No murmur heard. Pulmonary/Chest: No respiratory distress. She has no wheezes. She has no rales.  Neurological: She is alert and oriented to person, place, and time.  Skin: Skin is warm and dry. No rash noted. She is not diaphoretic. No erythema.  Psychiatric: She has a normal mood and affect. Her behavior is normal. Judgment and thought content normal.          Assessment & Plan:  1. Rhinosinusitis - amoxicillin-clavulanate (AUGMENTIN) 875-125 MG per tablet; Take 1 tablet by mouth 2 (two)  times daily.  Dispense: 28 tablet; Refill: 0   Patient Instructions  Take Augmentin as directed with food twice daily Use nose spray one spray each nostril at bedtime(dymista) Drink plenty of fluids Take Tylenol or Advil for pain

## 2013-03-11 NOTE — Patient Instructions (Signed)
Take Augmentin as directed with food twice daily Use nose spray one spray each nostril at bedtime(dymista) Drink plenty of fluids Take Tylenol or Advil for pain

## 2013-03-11 NOTE — Telephone Encounter (Signed)
APPT MADE

## 2013-03-29 ENCOUNTER — Other Ambulatory Visit: Payer: Self-pay | Admitting: Family Medicine

## 2013-04-27 ENCOUNTER — Other Ambulatory Visit: Payer: Self-pay | Admitting: Family Medicine

## 2013-05-15 ENCOUNTER — Telehealth: Payer: Self-pay | Admitting: Family Medicine

## 2013-05-17 ENCOUNTER — Telehealth: Payer: Self-pay | Admitting: Family Medicine

## 2013-05-20 NOTE — Telephone Encounter (Signed)
Have called few times with messages left and pt has not returned calls .

## 2013-05-20 NOTE — Telephone Encounter (Signed)
Called pt  this am message left to return call .

## 2013-05-25 ENCOUNTER — Other Ambulatory Visit: Payer: Self-pay | Admitting: Family Medicine

## 2013-06-10 ENCOUNTER — Other Ambulatory Visit: Payer: Self-pay | Admitting: Family Medicine

## 2013-06-14 ENCOUNTER — Ambulatory Visit: Payer: Self-pay | Admitting: Family Medicine

## 2013-07-15 ENCOUNTER — Ambulatory Visit (INDEPENDENT_AMBULATORY_CARE_PROVIDER_SITE_OTHER): Payer: BC Managed Care – PPO | Admitting: Family Medicine

## 2013-07-15 VITALS — BP 126/86 | HR 75 | Temp 97.0°F | Ht 63.0 in | Wt 152.0 lb

## 2013-07-15 DIAGNOSIS — J209 Acute bronchitis, unspecified: Secondary | ICD-10-CM

## 2013-07-15 DIAGNOSIS — I10 Essential (primary) hypertension: Secondary | ICD-10-CM

## 2013-07-15 DIAGNOSIS — K7689 Other specified diseases of liver: Secondary | ICD-10-CM

## 2013-07-15 DIAGNOSIS — E7800 Pure hypercholesterolemia, unspecified: Secondary | ICD-10-CM

## 2013-07-15 DIAGNOSIS — E039 Hypothyroidism, unspecified: Secondary | ICD-10-CM

## 2013-07-15 MED ORDER — AZITHROMYCIN 500 MG PO TABS: 500.0000 mg | ORAL_TABLET | Freq: Every day | ORAL | Status: DC

## 2013-07-15 MED ORDER — LISINOPRIL-HYDROCHLOROTHIAZIDE 10-12.5 MG PO TABS: ORAL_TABLET | ORAL | Status: DC

## 2013-07-15 MED ORDER — LEVOTHYROXINE SODIUM 75 MCG PO TABS: 75.0000 ug | ORAL_TABLET | Freq: Every day | ORAL | Status: DC

## 2013-07-15 NOTE — Progress Notes (Signed)
Patient ID: Stacy [name], female   DOB: 09/27/1956, 57 y.o.   MRN: [id number] SUBJECTIVE: CC: Chief Complaint  Patient presents with  . Needs medications refilled    HPI: Patient is here for follow up of hyperlipidemia: denies Headache;denies Chest Pain;denies weakness;denies Shortness of Breath and orthopnea;denies Visual changes;denies palpitations;denies cough;denies pedal edema;denies symptoms of TIA or stroke;deniesClaudication symptoms. admits to Compliance with medications; denies Problems with medications.  Has the cough and bronchial congestion going around. Has had labs at work. Was good except that the triglycerides were way up. Will drop off a copy.thyroid was good.  stopped lexapro because of weight gain. She weaned herself off of it.  Doing well especially with the new job at [employer]   Past Medical History  Diagnosis Date  . GERD (gastroesophageal reflux disease)   . Perimenopausal   . COPD, mild     Spirometerey 3/08  . Hypothyroid Since 2003  . IBS (irritable bowel syndrome)   . Fatty liver disease, nonalcoholic   . Hyperlipidemia   . Hypertension   . Asthma    Past Surgical History  Procedure Laterality Date  . Ltcs      x2  . Spirometry  01/05/2007    FVC 68% predicted; FEV1 44% predicted; FEV1/FVC 63% predicted = moderate obstruction with low vital capacity possibly due to restriction   History   Social History  . Marital Status: Divorced    Spouse Name: N/A    Number of Children: N/A  . Years of Education: N/A   Occupational History  . Works in Photographer    Social History Main Topics  . Smoking status: Former Smoker -- 0.50 packs/day for 30 years    Quit date: 10/25/2007  . Smokeless tobacco: Not on file  . Alcohol Use: Yes     Comment: Occasional  . Drug Use: Not on file  . Sexual Activity: Not on file   Other Topics Concern  . Not on file   Social History Narrative   Divorced from [place] from [place] in 2007   2 children in  [place], 4 grandkids   Does not exercise   Family History  Problem Relation Age of Onset  . Hypertension Father   . Hyperlipidemia Father   . Heart disease Father   . Raynaud syndrome Sister    Current Outpatient Prescriptions on File Prior to Visit  Medication Sig Dispense Refill  . albuterol (PROVENTIL HFA;VENTOLIN HFA) 108 (90 BASE) MCG/ACT inhaler Inhale 2 puffs into the lungs every 6 (six) hours as needed.        Marland Kitchen levothyroxine (SYNTHROID, LEVOTHROID) 75 MCG tablet Take 75 mcg by mouth daily before breakfast.      . lisinopril-hydrochlorothiazide (PRINZIDE,ZESTORETIC) 10-12.5 MG per tablet TAKE 1 TABLET BY MOUTH EVERY DAY  30 tablet  2   No current facility-administered medications on file prior to visit.   Allergies  Allergen Reactions  . Ciprofloxacin     REACTION: Rash  . Livalo [Pitavastatin]   . Moxifloxacin     REACTION: RASH  . Sulfamethoxazole W-Trimethoprim     REACTION: Rash  . Statins Other (See Comments)    myalgias   Immunization History  Administered Date(s) Administered  . H1N1 10/06/2008  . Influenza Whole 08/01/2007, 08/04/2009  . Pneumococcal Polysaccharide 08/01/2007  . Td 08/01/2007   Prior to Admission medications   Medication Sig Start Date End Date Taking? Authorizing Provider  albuterol (PROVENTIL HFA;VENTOLIN HFA) 108 (90 BASE) MCG/ACT inhaler Inhale  2 puffs into the lungs every 6 (six) hours as needed.     Yes Historical Provider, MD  levothyroxine (SYNTHROID, LEVOTHROID) 75 MCG tablet Take 75 mcg by mouth daily before breakfast.   Yes Historical Provider, MD  lisinopril-hydrochlorothiazide (PRINZIDE,ZESTORETIC) 10-12.5 MG per tablet TAKE 1 TABLET BY MOUTH EVERY DAY 06/10/13  Yes Ernestina Penna, MD     ROS: As above in the HPI. All other systems are stable or negative.  OBJECTIVE: APPEARANCE:  Patient in no acute distress.The patient appeared well nourished and normally developed. Acyanotic. Waist: VITAL SIGNS:BP 126/86  Pulse  75  Temp(Src) 97 F (36.1 C) (Oral)  Ht 5\' 3"  (1.6 m)  Wt 152 lb (68.947 kg)  BMI 26.93 kg/m2  WF  SKIN: warm and  Dry without overt rashes, tattoos and scars  HEAD and Neck: without JVD, Head and scalp: normal Eyes:No scleral icterus. Fundi normal, eye movements normal. Ears: Auricle normal, canal normal, Tympanic membranes normal, insufflation normal. Nose: normal Throat: normal Neck & thyroid: normal  CHEST & LUNGS: Chest wall: normal Lungs: Coarse bs. Rhonchi, no rales, no wheezes  CVS: Reveals the PMI to be normally located. Regular rhythm, First and Second Heart sounds are normal,  absence of murmurs, rubs or gallops. Peripheral vasculature: Radial pulses: normal Dorsal pedis pulses: normal Posterior pulses: normal  ABDOMEN:  Appearance: normal Benign, no organomegaly, no masses, no Abdominal Aortic enlargement. No Guarding , no rebound. No Bruits. Bowel sounds: normal  RECTAL: N/A GU: N/A  EXTREMETIES: nonedematous.  MUSCULOSKELETAL:  Spine: normal Joints: intact  NEUROLOGIC: oriented to time,place and person; nonfocal. Strength is normal Sensory is normal Reflexes are normal Cranial Nerves are normal. Results for orders placed during the hospital encounter of 06/07/10  PREGNANCY, URINE      Result Value Range   Preg Test, Ur       Value: NEGATIVE            THE SENSITIVITY OF THIS     METHODOLOGY IS >24 mIU/mL  CBC      Result Value Range   WBC 9.2  4.0 - 10.5 K/uL   RBC 4.49  3.87 - 5.11 MIL/uL   Hemoglobin 14.9  12.0 - 15.0 g/dL   HCT 40.9  81.1 - 91.4 %   MCV 96.8  78.0 - 100.0 fL   MCH 33.1  26.0 - 34.0 pg   MCHC 34.2  30.0 - 36.0 g/dL   RDW 78.2  95.6 - 21.3 %   Platelets 303  150 - 400 K/uL  COMPREHENSIVE METABOLIC PANEL      Result Value Range   Sodium 140  135 - 145 mEq/L   Potassium 3.4 (*) 3.5 - 5.1 mEq/L   Chloride 104  96 - 112 mEq/L   CO2 29  19 - 32 mEq/L   Glucose, Bld 99  70 - 99 mg/dL   BUN 5 (*) 6 - 23 mg/dL    Creatinine, Ser 0.86  0.4 - 1.2 mg/dL   Calcium 8.9  8.4 - 57.8 mg/dL   Total Protein 7.3  6.0 - 8.3 g/dL   Albumin 4.2  3.5 - 5.2 g/dL   AST 29  0 - 37 U/L   ALT 45 (*) 0 - 35 U/L   Alkaline Phosphatase 80  39 - 117 U/L   Total Bilirubin 0.4  0.3 - 1.2 mg/dL   GFR calc non Af Amer >60  >60 mL/min   GFR calc Af Amer    >  60 mL/min   Value: >60            The eGFR has been calculated     using the MDRD equation.     This calculation has not been     validated in all clinical     situations.     eGFR's persistently     <60 mL/min signify     possible Chronic Kidney Disease.    ASSESSMENT: Acute bronchitis - Plan: azithromycin (ZITHROMAX) 500 MG tablet  FATTY LIVER DISEASE  HYPERCHOLESTEROLEMIA, PURE  HYPERTENSION, BENIGN - Plan: lisinopril-hydrochlorothiazide (PRINZIDE,ZESTORETIC) 10-12.5 MG per tablet  HYPOTHYROIDISM NOS - Plan: levothyroxine (SYNTHROID, LEVOTHROID) 75 MCG tablet   PLAN: No orders of the defined types were placed in this encounter.    Meds ordered this encounter  Medications  . lisinopril-hydrochlorothiazide (PRINZIDE,ZESTORETIC) 10-12.5 MG per tablet    Sig: TAKE 1 TABLET BY MOUTH EVERY DAY    Dispense:  90 tablet    Refill:  3  . levothyroxine (SYNTHROID, LEVOTHROID) 75 MCG tablet    Sig: Take 1 tablet (75 mcg total) by mouth daily before breakfast.    Dispense:  90 tablet    Refill:  3  . azithromycin (ZITHROMAX) 500 MG tablet    Sig: Take 1 tablet (500 mg total) by mouth daily.    Dispense:  3 tablet    Refill:  0   Fluids rest.  Healthy diet and exercise.  Return in about 4 months (around 11/14/2013) for Recheck medical problems.  Jaquelin Meaney P. [place] Charon, M.D.

## 2013-11-26 ENCOUNTER — Ambulatory Visit (INDEPENDENT_AMBULATORY_CARE_PROVIDER_SITE_OTHER): Payer: BC Managed Care – PPO | Admitting: Family Medicine

## 2013-11-26 ENCOUNTER — Encounter: Payer: Self-pay | Admitting: Family Medicine

## 2013-11-26 VITALS — BP 120/73 | HR 71 | Temp 97.2°F | Ht 63.0 in | Wt 152.8 lb

## 2013-11-26 DIAGNOSIS — S335XXA Sprain of ligaments of lumbar spine, initial encounter: Secondary | ICD-10-CM

## 2013-11-26 DIAGNOSIS — N959 Unspecified menopausal and perimenopausal disorder: Secondary | ICD-10-CM

## 2013-11-26 DIAGNOSIS — J4489 Other specified chronic obstructive pulmonary disease: Secondary | ICD-10-CM

## 2013-11-26 DIAGNOSIS — L719 Rosacea, unspecified: Secondary | ICD-10-CM

## 2013-11-26 DIAGNOSIS — E7800 Pure hypercholesterolemia, unspecified: Secondary | ICD-10-CM

## 2013-11-26 DIAGNOSIS — J449 Chronic obstructive pulmonary disease, unspecified: Secondary | ICD-10-CM

## 2013-11-26 DIAGNOSIS — S39012A Strain of muscle, fascia and tendon of lower back, initial encounter: Secondary | ICD-10-CM | POA: Insufficient documentation

## 2013-11-26 DIAGNOSIS — I10 Essential (primary) hypertension: Secondary | ICD-10-CM

## 2013-11-26 DIAGNOSIS — E039 Hypothyroidism, unspecified: Secondary | ICD-10-CM

## 2013-11-26 DIAGNOSIS — K7689 Other specified diseases of liver: Secondary | ICD-10-CM

## 2013-11-26 DIAGNOSIS — K219 Gastro-esophageal reflux disease without esophagitis: Secondary | ICD-10-CM

## 2013-11-26 NOTE — Progress Notes (Signed)
Patient ID: Stacy [name], female   DOB: Oct 19, 1956, 58 y.o.   MRN: [id number] SUBJECTIVE: CC: Chief Complaint  Patient presents with  . Follow-up    4 month follow up chronic problems  fasting for labs      HPI: Patient is here for follow up of hyperlipidemia/HTN/GERD/Fatty LIver.: denies Headache;denies Chest Pain;denies weakness;denies Shortness of Breath and orthopnea;denies Visual changes;denies palpitations;denies cough;denies pedal edema;denies symptoms of TIA or stroke;deniesClaudication symptoms. admits to Compliance with medications; denies Problems with medications. Doing mostly good. Back problems flare up when  Past Medical History  Diagnosis Date  . GERD (gastroesophageal reflux disease)   . Perimenopausal   . COPD, mild     Spirometerey 3/08  . Hypothyroid Since 2003  . IBS (irritable bowel syndrome)   . Fatty liver disease, nonalcoholic   . Hyperlipidemia   . Hypertension   . Asthma    Past Surgical History  Procedure Laterality Date  . Ltcs      x2  . Spirometry  01/05/2007    FVC 68% predicted; FEV1 44% predicted; FEV1/FVC 63% predicted = moderate obstruction with low vital capacity possibly due to restriction   History   Social History  . Marital Status: Divorced    Spouse Name: N/A    Number of Children: N/A  . Years of Education: N/A   Occupational History  . Works in Science writer    Social History Main Topics  . Smoking status: Former Smoker -- 0.50 packs/day for 30 years    Quit date: 10/25/2007  . Smokeless tobacco: Not on file  . Alcohol Use: Yes     Comment: Occasional  . Drug Use: Not on file  . Sexual Activity: Not on file   Other Topics Concern  . Not on file   Social History Narrative   Divorced from [place] from [place] in 2007   2 children in [place], 4 grandkids   Does not exercise   Family History  Problem Relation Age of Onset  . Hypertension Father   . Hyperlipidemia Father   . Heart disease Father   . Raynaud  syndrome Sister    Current Outpatient Prescriptions on File Prior to Visit  Medication Sig Dispense Refill  . levothyroxine (SYNTHROID, LEVOTHROID) 75 MCG tablet Take 1 tablet (75 mcg total) by mouth daily before breakfast.  90 tablet  3  . lisinopril-hydrochlorothiazide (PRINZIDE,ZESTORETIC) 10-12.5 MG per tablet TAKE 1 TABLET BY MOUTH EVERY DAY  90 tablet  3  . albuterol (PROVENTIL HFA;VENTOLIN HFA) 108 (90 BASE) MCG/ACT inhaler Inhale 2 puffs into the lungs every 6 (six) hours as needed.        Marland Kitchen azithromycin (ZITHROMAX) 500 MG tablet Take 1 tablet (500 mg total) by mouth daily.  3 tablet  0   No current facility-administered medications on file prior to visit.   Allergies  Allergen Reactions  . Influenza Vaccines   . Ciprofloxacin     REACTION: Rash  . Livalo [Pitavastatin]   . Moxifloxacin     REACTION: RASH  . Sulfamethoxazole-Trimethoprim     REACTION: Rash  . Statins Other (See Comments)    myalgias   Immunization History  Administered Date(s) Administered  . H1N1 10/06/2008  . Influenza Whole 08/01/2007, 08/04/2009  . Pneumococcal Polysaccharide-23 08/01/2007  . Td 08/01/2007   Prior to Admission medications   Medication Sig Start Date End Date Taking? Authorizing Provider  levothyroxine (SYNTHROID, LEVOTHROID) 75 MCG tablet Take 1 tablet (75 mcg total) by  mouth daily before breakfast. 07/15/13  Yes Vernie Shanks, MD  lisinopril-hydrochlorothiazide (PRINZIDE,ZESTORETIC) 10-12.5 MG per tablet TAKE 1 TABLET BY MOUTH EVERY DAY 07/15/13  Yes Vernie Shanks, MD  albuterol (PROVENTIL HFA;VENTOLIN HFA) 108 (90 BASE) MCG/ACT inhaler Inhale 2 puffs into the lungs every 6 (six) hours as needed.      Historical Provider, MD  azithromycin (ZITHROMAX) 500 MG tablet Take 1 tablet (500 mg total) by mouth daily. 07/15/13   Vernie Shanks, MD     ROS: As above in the HPI. All other systems are stable or negative.  OBJECTIVE: APPEARANCE:  Patient in no acute distress.The patient  appeared well nourished and normally developed. Acyanotic. Waist: VITAL SIGNS:BP 120/73  Pulse 71  Temp(Src) 97.2 F (36.2 C) (Oral)  Ht 5' 3" (1.6 m)  Wt 152 lb 12.8 oz (69.31 kg)  BMI 27.07 kg/m2 WF  SKIN: warm and  Dry without overt rashes, tattoos and scars  HEAD and Neck: without JVD, Head and scalp: normal Eyes:No scleral icterus. Fundi normal, eye movements normal. Ears: Auricle normal, canal normal, Tympanic membranes normal, insufflation normal. Nose: normal Throat: normal Neck & thyroid: normal  CHEST & LUNGS: Chest wall: normal Lungs: Clear  CVS: Reveals the PMI to be normally located. Regular rhythm, First and Second Heart sounds are normal,  absence of murmurs, rubs or gallops. Peripheral vasculature: Radial pulses: normal Dorsal pedis pulses: normal Posterior pulses: normal  ABDOMEN:  Appearance: normal Benign, no organomegaly, no masses, no Abdominal Aortic enlargement. No Guarding , no rebound. No Bruits. Bowel sounds: normal  RECTAL: N/A GU: N/A  EXTREMETIES: nonedematous.  MUSCULOSKELETAL:  Spine: normal Joints: intact  NEUROLOGIC: oriented to time,place and person; nonfocal. Strength is normal Sensory is normal Reflexes are normal Cranial Nerves are normal.  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  PLAN: Exercises for the back strain in the AVS.       Dr Paula Libra Recommendations  For nutrition information, I recommend books:  1).Eat to Live by Dr Excell Seltzer. 2).Prevent and Reverse Heart Disease by Dr Karl Luke. 3) Dr Janene Harvey Book:  Program to Reverse Diabetes  Exercise recommendations are:  If unable to walk, then the patient can exercise in a  chair 3 times a day. By flapping arms like a bird gently and raising legs outwards to the front.  If ambulatory, the patient can go for walks for 30 minutes 3 times a week. Then increase the intensity and duration as tolerated.  Goal is to try to attain exercise frequency to 5 times a week.  If applicable: Best to perform resistance exercises (machines or weights) 2 days a week and cardio type exercises 3 days per week.   Also have a short step stool to alternate stepping on and not stand straight for long periods of time.   Orders Placed This Encounter  Procedures  . CMP14+EGFR  . Lipid panel  . TSH   No orders of the defined types were placed in this encounter.   There are no discontinued medications. Return in about 4 months (around 03/26/2014) for Recheck medical problems.  Aland Chestnutt P. Jacelyn Grip, M.D.

## 2013-11-26 NOTE — Patient Instructions (Addendum)
Back Exercises Back exercises help treat and prevent back injuries. The goal of back exercises is to increase the strength of your abdominal and back muscles and the flexibility of your back. These exercises should be started when you no longer have back pain. Back exercises include:  Pelvic Tilt. Lie on your back with your knees bent. Tilt your pelvis until the lower part of your back is against the floor. Hold this position 5 to 10 sec and repeat 5 to 10 times.  Knee to Chest. Pull first 1 knee up against your chest and hold for 20 to 30 seconds, repeat this with the other knee, and then both knees. This may be done with the other leg straight or bent, whichever feels better.  Sit-Ups or Curl-Ups. Bend your knees 90 degrees. Start with tilting your pelvis, and do a partial, slow sit-up, lifting your trunk only 30 to 45 degrees off the floor. Take at least 2 to 3 seconds for each sit-up. Do not do sit-ups with your knees out straight. If partial sit-ups are difficult, simply do the above but with only tightening your abdominal muscles and holding it as directed.  Hip-Lift. Lie on your back with your knees flexed 90 degrees. Push down with your feet and shoulders as you raise your hips a couple inches off the floor; hold for 10 seconds, repeat 5 to 10 times.  Back arches. Lie on your stomach, propping yourself up on bent elbows. Slowly press on your hands, causing an arch in your low back. Repeat 3 to 5 times. Any initial stiffness and discomfort should lessen with repetition over time.  Shoulder-Lifts. Lie face down with arms beside your body. Keep hips and torso pressed to floor as you slowly lift your head and shoulders off the floor. Do not overdo your exercises, especially in the beginning. Exercises may cause you some mild back discomfort which lasts for a few minutes; however, if the pain is more severe, or lasts for more than 15 minutes, do not continue exercises until you see your caregiver.  Improvement with exercise therapy for back problems is slow.  See your caregivers for assistance with developing a proper back exercise program. Document Released: 11/17/2004 Document Revised: 01/02/2012 Document Reviewed: 08/11/2011 [facility] Patient Information 2014 [place].        Dr Paula Libra Recommendations  For nutrition information, I recommend books:  1).Eat to Live by Dr Excell Seltzer. 2).Prevent and Reverse Heart Disease by Dr Karl Luke. 3) Dr Janene Harvey Book:  Program to Reverse Diabetes  Exercise recommendations are:  If unable to walk, then the patient can exercise in a chair 3 times a day. By flapping arms like a bird gently and raising legs outwards to the front.  If ambulatory, the patient can go for walks for 30 minutes 3 times a week. Then increase the intensity and duration as tolerated.  Goal is to try to attain exercise frequency to 5 times a week.  If applicable: Best to perform resistance exercises (machines or weights) 2 days a week and cardio type exercises 3 days per week.

## 2013-11-27 ENCOUNTER — Other Ambulatory Visit: Payer: Self-pay | Admitting: Family Medicine

## 2013-11-27 DIAGNOSIS — E7800 Pure hypercholesterolemia, unspecified: Secondary | ICD-10-CM

## 2013-11-27 LAB — CMP14+EGFR
ALT: 32 IU/L (ref 0–32)
AST: 24 IU/L (ref 0–40)
Albumin/Globulin Ratio: 2.2 (ref 1.1–2.5)
Albumin: 4.6 g/dL (ref 3.5–5.5)
Alkaline Phosphatase: 76 IU/L (ref 39–117)
BUN/Creatinine Ratio: 15 (ref 9–23)
BUN: 11 mg/dL (ref 6–24)
CO2: 24 mmol/L (ref 18–29)
Calcium: 9.5 mg/dL (ref 8.7–10.2)
Chloride: 102 mmol/L (ref 97–108)
Creatinine, Ser: 0.72 mg/dL (ref 0.57–1.00)
GFR calc Af Amer: 109 mL/min/{1.73_m2} (ref >59)
GFR calc non Af Amer: 95 mL/min/{1.73_m2} (ref >59)
Globulin, Total: 2.1 g/dL (ref 1.5–4.5)
Glucose: 115 mg/dL — ABNORMAL HIGH (ref 65–99)
Potassium: 4.5 mmol/L (ref 3.5–5.2)
Sodium: 142 mmol/L (ref 134–144)
Total Bilirubin: 0.3 mg/dL (ref 0.0–1.2)
Total Protein: 6.7 g/dL (ref 6.0–8.5)

## 2013-11-27 LAB — LIPID PANEL
Chol/HDL Ratio: 4.8 ratio units — ABNORMAL HIGH (ref 0.0–4.4)
Cholesterol, Total: 201 mg/dL — ABNORMAL HIGH (ref 100–199)
HDL: 42 mg/dL (ref >39)
LDL Calculated: 138 mg/dL — ABNORMAL HIGH (ref 0–99)
Triglycerides: 104 mg/dL (ref 0–149)
VLDL Cholesterol Cal: 21 mg/dL (ref 5–40)

## 2013-11-27 LAB — TSH: TSH: 2.01 u[IU]/mL (ref 0.450–4.500)

## 2013-11-27 MED ORDER — EZETIMIBE 10 MG PO TABS: 10.0000 mg | ORAL_TABLET | Freq: Every day | ORAL | Status: DC

## 2013-12-03 ENCOUNTER — Telehealth: Payer: Self-pay | Admitting: Family Medicine

## 2013-12-05 NOTE — Telephone Encounter (Signed)
Left message for pt to return call.

## 2013-12-29 ENCOUNTER — Emergency Department (HOSPITAL_COMMUNITY): Payer: BC Managed Care – PPO

## 2013-12-29 ENCOUNTER — Encounter (HOSPITAL_COMMUNITY): Payer: Self-pay | Admitting: Emergency Medicine

## 2013-12-29 ENCOUNTER — Emergency Department (HOSPITAL_COMMUNITY)
Admission: EM | Admit: 2013-12-29 | Discharge: 2013-12-29 | Disposition: A | Discharge status: Home / Self Care | Payer: BC Managed Care – PPO | Attending: Emergency Medicine | Admitting: Emergency Medicine

## 2013-12-29 DIAGNOSIS — Z87891 Personal history of nicotine dependence: Secondary | ICD-10-CM | POA: Insufficient documentation

## 2013-12-29 DIAGNOSIS — Z8742 Personal history of other diseases of the female genital tract: Secondary | ICD-10-CM | POA: Insufficient documentation

## 2013-12-29 DIAGNOSIS — Z79899 Other long term (current) drug therapy: Secondary | ICD-10-CM | POA: Insufficient documentation

## 2013-12-29 DIAGNOSIS — M549 Dorsalgia, unspecified: Secondary | ICD-10-CM | POA: Insufficient documentation

## 2013-12-29 DIAGNOSIS — J449 Chronic obstructive pulmonary disease, unspecified: Secondary | ICD-10-CM | POA: Insufficient documentation

## 2013-12-29 DIAGNOSIS — R109 Unspecified abdominal pain: Secondary | ICD-10-CM | POA: Insufficient documentation

## 2013-12-29 DIAGNOSIS — K219 Gastro-esophageal reflux disease without esophagitis: Secondary | ICD-10-CM | POA: Insufficient documentation

## 2013-12-29 DIAGNOSIS — I10 Essential (primary) hypertension: Secondary | ICD-10-CM | POA: Insufficient documentation

## 2013-12-29 DIAGNOSIS — E039 Hypothyroidism, unspecified: Secondary | ICD-10-CM | POA: Insufficient documentation

## 2013-12-29 DIAGNOSIS — J4489 Other specified chronic obstructive pulmonary disease: Secondary | ICD-10-CM | POA: Insufficient documentation

## 2013-12-29 DIAGNOSIS — R110 Nausea: Secondary | ICD-10-CM | POA: Insufficient documentation

## 2013-12-29 LAB — URINALYSIS, ROUTINE W REFLEX MICROSCOPIC
BILIRUBIN URINE: NEGATIVE
Glucose, UA: NEGATIVE mg/dL
HGB URINE DIPSTICK: NEGATIVE
Ketones, ur: NEGATIVE mg/dL
Leukocytes, UA: NEGATIVE
Nitrite: NEGATIVE
PH: 5.5 (ref 5.0–8.0)
Protein, ur: NEGATIVE mg/dL
Specific Gravity, Urine: 1.01 (ref 1.005–1.030)
Urobilinogen, UA: 0.2 mg/dL (ref 0.0–1.0)

## 2013-12-29 LAB — CBC WITH DIFFERENTIAL/PLATELET
BASOS ABS: 0 10*3/uL (ref 0.0–0.1)
Basophils Relative: 1 % (ref 0–1)
EOS PCT: 2 % (ref 0–5)
Eosinophils Absolute: 0.1 10*3/uL (ref 0.0–0.7)
HCT: 43.4 % (ref 36.0–46.0)
Hemoglobin: 14.8 g/dL (ref 12.0–15.0)
LYMPHS PCT: 40 % (ref 12–46)
Lymphs Abs: 2.7 10*3/uL (ref 0.7–4.0)
MCH: 32.2 pg (ref 26.0–34.0)
MCHC: 34.1 g/dL (ref 30.0–36.0)
MCV: 94.6 fL (ref 78.0–100.0)
Monocytes Absolute: 0.5 10*3/uL (ref 0.1–1.0)
Monocytes Relative: 7 % (ref 3–12)
Neutro Abs: 3.5 10*3/uL (ref 1.7–7.7)
Neutrophils Relative %: 51 % (ref 43–77)
PLATELETS: 276 10*3/uL (ref 150–400)
RBC: 4.59 MIL/uL (ref 3.87–5.11)
RDW: 12.3 % (ref 11.5–15.5)
WBC: 6.8 10*3/uL (ref 4.0–10.5)

## 2013-12-29 LAB — COMPREHENSIVE METABOLIC PANEL
ALT: 38 U/L — AB (ref 0–35)
AST: 26 U/L (ref 0–37)
Albumin: 4.2 g/dL (ref 3.5–5.2)
Alkaline Phosphatase: 84 U/L (ref 39–117)
BUN: 14 mg/dL (ref 6–23)
CALCIUM: 9.7 mg/dL (ref 8.4–10.5)
CO2: 28 meq/L (ref 19–32)
Chloride: 100 mEq/L (ref 96–112)
Creatinine, Ser: 0.68 mg/dL (ref 0.50–1.10)
GFR calc Af Amer: >90 mL/min (ref >90)
GFR calc non Af Amer: >90 mL/min (ref >90)
Glucose, Bld: 110 mg/dL — ABNORMAL HIGH (ref 70–99)
POTASSIUM: 4.2 meq/L (ref 3.7–5.3)
SODIUM: 139 meq/L (ref 137–147)
TOTAL PROTEIN: 7.6 g/dL (ref 6.0–8.3)
Total Bilirubin: 0.5 mg/dL (ref 0.3–1.2)

## 2013-12-29 LAB — D-DIMER, QUANTITATIVE: D-Dimer, Quant: <0.27 ug/mL-FEU (ref 0.00–0.48)

## 2013-12-29 MED ORDER — HYDROCODONE-ACETAMINOPHEN 5-325 MG PO TABS: 1.0000 | ORAL_TABLET | Freq: Four times a day (QID) | ORAL | Status: DC | PRN

## 2013-12-29 MED ORDER — MORPHINE SULFATE 4 MG/ML IJ SOLN
4.0000 mg | Freq: Once | INTRAMUSCULAR | Status: AC
  Administered 2013-12-29: 4 mg via INTRAVENOUS
  Filled 2013-12-29: qty 1

## 2013-12-29 MED ORDER — ONDANSETRON HCL 4 MG/2ML IJ SOLN
4.0000 mg | Freq: Once | INTRAMUSCULAR | Status: AC
  Administered 2013-12-29: 4 mg via INTRAVENOUS
  Filled 2013-12-29: qty 2

## 2013-12-29 NOTE — ED Notes (Signed)
Pt reports right sided back pain that radiates to RUQ since Friday. Pt also reports nausea and "some diarrhea". Pt states she's had similar symptoms in the past and had her gallbladder checked. Pt states tests were negative. Pt also states "for the past week my stool has been yellow".

## 2013-12-29 NOTE — ED Provider Notes (Signed)
CSN: [id number]     Arrival date & time 12/29/13  4401 History  This chart was scribed for Stacy Hacker, MD by Ludger Nutting, ED Scribe. This patient was seen in room APA04/APA04 and the patient's care was started 9:39 AM.    Chief Complaint  Patient presents with  . Back Pain  . Abdominal Pain      The history is provided by the patient. No language interpreter was used.   HPI Comments: Stacy [name] is a 58 y.o. female who presents to the Emergency Department complaining of 2 days of  intermittent mid back pain that radiates to the right side. She describes the pain as stabbing and sharp, rates it as 7/10 currently. She also has associated nausea. She states the pain is worse with deep breathing and laying down. She denies similar symptoms in the past. She has taken a Nexium today but no OTC pain medication. She denies dysuria, hematuria, chest pain, SOB, fever, diarrhea, vomiting, rash. She denies history of kidney stones.   Past Medical History  Diagnosis Date  . GERD (gastroesophageal reflux disease)   . Perimenopausal   . COPD, mild     Spirometerey 3/08  . Hypothyroid Since 2003  . IBS (irritable bowel syndrome)   . Fatty liver disease, nonalcoholic   . Hyperlipidemia   . Hypertension   . Asthma    Past Surgical History  Procedure Laterality Date  . Ltcs      x2  . Spirometry  01/05/2007    FVC 68% predicted; FEV1 44% predicted; FEV1/FVC 63% predicted = moderate obstruction with low vital capacity possibly due to restriction  . Cesarean section    . Tubal ligation    . Ablation     Family History  Problem Relation Age of Onset  . Hypertension Father   . Hyperlipidemia Father   . Heart disease Father   . Raynaud syndrome Sister    History  Substance Use Topics  . Smoking status: Former Smoker -- 0.50 packs/day for 30 years    Types: Cigarettes    Quit date: 10/25/2007  . Smokeless tobacco: Never Used  . Alcohol Use: Yes     Comment: Occasional-once a year    OB History   Grav Para Term Preterm Abortions TAB SAB Ect Mult Living   3 2 2  1  1   2      Review of Systems  Constitutional: Negative for fever.  Respiratory: Negative for cough, chest tightness and shortness of breath.   Cardiovascular: Negative for chest pain.  Gastrointestinal: Positive for nausea. Negative for vomiting and abdominal pain.  Genitourinary: Positive for flank pain. Negative for dysuria and hematuria.  Musculoskeletal: Positive for back pain.  Skin: Negative for rash.  Neurological: Negative for headaches.  All other systems reviewed and are negative.      Allergies  Influenza vaccines; Avelox; Ciprofloxacin; Livalo; Moxifloxacin; Sulfamethoxazole-trimethoprim; Bactrim; and Statins  Home Medications   Current Outpatient Rx  Name  Route  Sig  Dispense  Refill  . albuterol (PROVENTIL HFA;VENTOLIN HFA) 108 (90 BASE) MCG/ACT inhaler   Inhalation   Inhale 2 puffs into the lungs every 6 (six) hours as needed for wheezing or shortness of breath.          . esomeprazole (NEXIUM) 20 MG capsule   Oral   Take 20 mg by mouth daily at 12 noon.         Marland Kitchen levothyroxine (SYNTHROID, LEVOTHROID) 75 MCG tablet  Oral   Take 1 tablet (75 mcg total) by mouth daily before breakfast.   90 tablet   3   . lisinopril-hydrochlorothiazide (PRINZIDE,ZESTORETIC) 10-12.5 MG per tablet      TAKE 1 TABLET BY MOUTH EVERY DAY   90 tablet   3   . HYDROcodone-acetaminophen (NORCO/VICODIN) 5-325 MG per tablet   Oral   Take 1 tablet by mouth every 6 (six) hours as needed.   10 tablet   0    BP 103/66  Pulse 54  Temp(Src) 97.7 F (36.5 C) (Oral)  Resp 18  SpO2 94% Physical Exam  Nursing note and vitals reviewed. Constitutional: She is oriented to person, place, and time. She appears well-developed and well-nourished. No distress.  HENT:  Head: Normocephalic and atraumatic.  Eyes: Pupils are equal, round, and reactive to light.  Neck: Neck supple.  Cardiovascular:  Normal rate, regular rhythm and normal heart sounds.   No murmur heard. Pulmonary/Chest: Effort normal and breath sounds normal. No respiratory distress. She has no wheezes.  Abdominal: Soft. Bowel sounds are normal. There is no tenderness. There is no rebound and no guarding.  Musculoskeletal: She exhibits no edema.  Neurological: She is alert and oriented to person, place, and time.  Skin: Skin is warm and dry. No rash noted. No erythema.  Psychiatric: She has a normal mood and affect.    ED Course  Procedures (including critical care time)  DIAGNOSTIC STUDIES: Oxygen Saturation is 100% on RA, normal by my interpretation.    COORDINATION OF CARE: 9:45 AM Discussed treatment plan with pt at bedside and pt agreed to plan.   Labs Review Labs Reviewed  COMPREHENSIVE METABOLIC PANEL - Abnormal; Notable for the following:    Glucose, Bld 110 (*)    ALT 38 (*)    All other components within normal limits  CBC WITH DIFFERENTIAL  URINALYSIS, ROUTINE W REFLEX MICROSCOPIC  D-DIMER, QUANTITATIVE   Imaging Review Ct Abdomen Pelvis Wo Contrast  12/29/2013   CLINICAL DATA:  Right upper quadrant and right-sided flank pain for the past 2 days.  EXAM: CT ABDOMEN AND PELVIS WITHOUT CONTRAST  TECHNIQUE: Multidetector CT imaging of the abdomen and pelvis was performed following the standard protocol without intravenous contrast.  COMPARISON:  No priors.  FINDINGS: Lung Bases: Unremarkable.  Abdomen/Pelvis: Diffuse low attenuation throughout the hepatic parenchyma, compatible with severe hepatic steatosis. Calcification in the posterior aspect of the right lobe of the liver is presumably a granuloma. The unenhanced appearance of the gallbladder, pancreas, spleen, bilateral adrenal glands and bilateral kidneys is unremarkable. Specifically, there are no abnormal calcifications within the collecting system of either kidney, along the course of either ureter, or within the lumen of the urinary bladder. No  hydroureteronephrosis or perinephric stranding to suggest urinary tract obstruction at this time. No significant volume of ascites. No pneumoperitoneum. No pathologic distention of small bowel. No definite lymphadenopathy identified within the abdomen or pelvis on today's non contrast CT examination. Mild atherosclerosis throughout the abdominal and pelvic vasculature, without evidence of aneurysm. Uterus and ovaries are unremarkable in appearance. Urinary bladder is normal in appearance.  Musculoskeletal: There are no aggressive appearing lytic or blastic lesions noted in the visualized portions of the skeleton.  IMPRESSION: 1. No acute findings in the abdomen or pelvis to account for the patient's symptoms. Specifically, no evidence of gallstones, findings to suggest acute cholecystitis, and no abnormal urinary tract calcifications or findings of urinary tract obstruction. 2. Severe hepatic steatosis. 3. Atherosclerosis.  Electronically Signed   By: Vinnie Langton M.D.   On: 12/29/2013 10:39   Dg Chest 2 View  12/29/2013   CLINICAL DATA:  Back pain, abdominal pain.  EXAM: CHEST  2 VIEW  COMPARISON:  09/17/2012  FINDINGS: Mild hyperinflation of the lungs. Heart and mediastinal contours are within normal limits. No focal opacities or effusions. No acute bony abnormality.  IMPRESSION: No active cardiopulmonary disease.   Electronically Signed   By: Rolm Baptise M.D.   On: 12/29/2013 10:25     EKG Interpretation   Date/Time:  Sunday December 29 2013 11:19:21 EDT Ventricular Rate:  54 PR Interval:  142 QRS Duration: 86 QT Interval:  482 QTC Calculation: 457 R Axis:   69 Text Interpretation:  Sinus bradycardia Low voltage QRS Borderline ECG No  previous ECGs available Confirmed by HORTON  MD, COURTNEY ([id number]) on  12/29/2013 11:24:22 AM      MDM   Final diagnoses:  Back pain    Patient presents with right flank pain that radiates into right upper chest and right upper quadrant. She is nontoxic on  exam. Vital signs within normal limits. She does appear somewhat uncomfortable. No history of kidney stones. Other considerations include chest pathology including right-sided pneumonia or PE. EKG is nonischemic and reassuring.  Chest x-ray without evidence of pneumonia. D-dimer is negative.  Patient had one episode of emesis while the ER and reports improvement of symptoms.  At this time I'm unsure the etiology of her presentation. I reassured her workup. I discussed with patient other considerations including musculoskeletal pain or early shingles prior to rash presentation. Patient stated understanding. She will be sent home with pain medication. She is to followup with her primary doctor in 2 days for recheck.  I personally performed the services described in this documentation, which was scribed in my presence. The recorded information has been reviewed and is accurate.   Stacy Hacker, MD 12/29/13 [phone]

## 2013-12-29 NOTE — Discharge Instructions (Signed)
You were evaluated today for back pain. Evaluation today did not reveal abnormalities that would explain your symptoms. Your EKG does not show any evidence of heart attack. Screening tests for blood clot was negative. He has no evidence of kidney stones or gallstones. Other considerations include pain prior to shingles outbreak given the distribution of her pain. Continue to monitor for rash. You should followup with her primary physician in 2 days for a recheck. If you have any new or worsening symptoms you should return for immediate reevaluation

## 2013-12-29 NOTE — ED Notes (Signed)
Patient c/o right mid back pain that radiates into right upper abd x1 week. Per patient nausea and some diarrhea. Denies any fevers, vomiting, or urinary symptoms. Patient reports pain worse with laying. Patient also reports yellow stools. Per patient had similar episode in past in which her gallbladder was checked.

## 2013-12-29 NOTE — ED Notes (Signed)
Pt states her pain is better now.  Just vomited small amount of emesis.  States her nausea is better now that she vomited.

## 2013-12-31 ENCOUNTER — Ambulatory Visit (INDEPENDENT_AMBULATORY_CARE_PROVIDER_SITE_OTHER): Payer: BC Managed Care – PPO

## 2013-12-31 ENCOUNTER — Ambulatory Visit (INDEPENDENT_AMBULATORY_CARE_PROVIDER_SITE_OTHER): Payer: BC Managed Care – PPO | Admitting: Family Medicine

## 2013-12-31 ENCOUNTER — Encounter: Payer: Self-pay | Admitting: Family Medicine

## 2013-12-31 VITALS — BP 105/69 | HR 66 | Temp 97.8°F | Ht 63.0 in | Wt 152.6 lb

## 2013-12-31 DIAGNOSIS — K219 Gastro-esophageal reflux disease without esophagitis: Secondary | ICD-10-CM

## 2013-12-31 DIAGNOSIS — I10 Essential (primary) hypertension: Secondary | ICD-10-CM

## 2013-12-31 DIAGNOSIS — J449 Chronic obstructive pulmonary disease, unspecified: Secondary | ICD-10-CM

## 2013-12-31 DIAGNOSIS — R1013 Epigastric pain: Secondary | ICD-10-CM

## 2013-12-31 DIAGNOSIS — IMO0002 Reserved for concepts with insufficient information to code with codable children: Secondary | ICD-10-CM

## 2013-12-31 DIAGNOSIS — K7689 Other specified diseases of liver: Secondary | ICD-10-CM

## 2013-12-31 DIAGNOSIS — M5414 Radiculopathy, thoracic region: Secondary | ICD-10-CM

## 2013-12-31 DIAGNOSIS — E039 Hypothyroidism, unspecified: Secondary | ICD-10-CM

## 2013-12-31 DIAGNOSIS — L719 Rosacea, unspecified: Secondary | ICD-10-CM

## 2013-12-31 DIAGNOSIS — J4489 Other specified chronic obstructive pulmonary disease: Secondary | ICD-10-CM

## 2013-12-31 DIAGNOSIS — M4120 Other idiopathic scoliosis, site unspecified: Secondary | ICD-10-CM

## 2013-12-31 DIAGNOSIS — E7800 Pure hypercholesterolemia, unspecified: Secondary | ICD-10-CM

## 2013-12-31 DIAGNOSIS — G8929 Other chronic pain: Secondary | ICD-10-CM

## 2013-12-31 NOTE — Progress Notes (Signed)
Patient ID: Stacy [name], female   DOB: 11/27/1955, 58 y.o.   MRN: [id number] SUBJECTIVE: CC: Chief Complaint  Patient presents with  . Hospitalization Follow-up    ER VISIT @[facility]  REFILLS ON ALL MEDS    HPI: Follow up from the Ed at [facility]. Had epigastric pain and a pain in the spine at approximately T10 level. The patient believes it must be her gall bladder even though there was no fever, no nausea and no vomiting and she had a negative U/S, HIDA scan last year and CT abdomen, in recent ED evaluation.  Past Medical History  Diagnosis Date  . GERD (gastroesophageal reflux disease)   . Perimenopausal   . COPD, mild     Spirometerey 3/08  . Hypothyroid Since 2003  . IBS (irritable bowel syndrome)   . Fatty liver disease, nonalcoholic   . Hyperlipidemia   . Hypertension   . Asthma    Past Surgical History  Procedure Laterality Date  . Ltcs      x2  . Spirometry  01/05/2007    FVC 68% predicted; FEV1 44% predicted; FEV1/FVC 63% predicted = moderate obstruction with low vital capacity possibly due to restriction  . Cesarean section    . Tubal ligation    . Ablation     History   Social History  . Marital Status: Divorced    Spouse Name: N/A    Number of Children: N/A  . Years of Education: N/A   Occupational History  . Works in Science writer    Social History Main Topics  . Smoking status: Former Smoker -- 0.50 packs/day for 30 years    Types: Cigarettes    Quit date: 10/25/2007  . Smokeless tobacco: Never Used  . Alcohol Use: Yes     Comment: Occasional-once a year  . Drug Use: No  . Sexual Activity: Not on file   Other Topics Concern  . Not on file   Social History Narrative   Divorced from [place] from [place] in 2007   2 children in [place], 4 grandkids   Does not exercise   Family History  Problem Relation Age of Onset  . Hypertension Father   . Hyperlipidemia Father   . Heart disease Father   . Raynaud syndrome Sister    Current  Outpatient Prescriptions on File Prior to Visit  Medication Sig Dispense Refill  . albuterol (PROVENTIL HFA;VENTOLIN HFA) 108 (90 BASE) MCG/ACT inhaler Inhale 2 puffs into the lungs every 6 (six) hours as needed for wheezing or shortness of breath.       . esomeprazole (NEXIUM) 20 MG capsule Take 20 mg by mouth daily at 12 noon.      Marland Kitchen levothyroxine (SYNTHROID, LEVOTHROID) 75 MCG tablet Take 1 tablet (75 mcg total) by mouth daily before breakfast.  90 tablet  3  . lisinopril-hydrochlorothiazide (PRINZIDE,ZESTORETIC) 10-12.5 MG per tablet TAKE 1 TABLET BY MOUTH EVERY DAY  90 tablet  3  . HYDROcodone-acetaminophen (NORCO/VICODIN) 5-325 MG per tablet Take 1 tablet by mouth every 6 (six) hours as needed.  10 tablet  0   No current facility-administered medications on file prior to visit.   Allergies  Allergen Reactions  . Influenza Vaccines   . Avelox [Moxifloxacin Hcl In Nacl] Hives  . Ciprofloxacin     REACTION: Rash  . Livalo [Pitavastatin]   . Moxifloxacin     REACTION: RASH  . Sulfamethoxazole-Trimethoprim     REACTION: Rash  .  Bactrim [Sulfamethoxazole-Tmp Ds] Rash  . Statins Other (See Comments)    myalgias   Immunization History  Administered Date(s) Administered  . H1N1 10/06/2008  . Influenza Whole 08/01/2007, 08/04/2009  . Pneumococcal Polysaccharide-23 08/01/2007  . Td 08/01/2007   Prior to Admission medications   Medication Sig Start Date End Date Taking? Authorizing Provider  albuterol (PROVENTIL HFA;VENTOLIN HFA) 108 (90 BASE) MCG/ACT inhaler Inhale 2 puffs into the lungs every 6 (six) hours as needed for wheezing or shortness of breath.    Yes Historical Provider, MD  esomeprazole (NEXIUM) 20 MG capsule Take 20 mg by mouth daily at 12 noon.   Yes Historical Provider, MD  levothyroxine (SYNTHROID, LEVOTHROID) 75 MCG tablet Take 1 tablet (75 mcg total) by mouth daily before breakfast. 07/15/13  Yes Vernie Shanks, MD  lisinopril-hydrochlorothiazide (PRINZIDE,ZESTORETIC)  10-12.5 MG per tablet TAKE 1 TABLET BY MOUTH EVERY DAY 07/15/13  Yes Vernie Shanks, MD  HYDROcodone-acetaminophen (NORCO/VICODIN) 5-325 MG per tablet Take 1 tablet by mouth every 6 (six) hours as needed. 12/29/13   [name], MD     ROS: As above in the HPI. All other systems are stable or negative.  OBJECTIVE: APPEARANCE:  Patient in no acute distress.The patient appeared well nourished and normally developed. Acyanotic. Waist: VITAL SIGNS:BP 105/69  Pulse 66  Temp(Src) 97.8 F (36.6 C) (Oral)  Ht 5\' 3"  (1.6 m)  Wt 152 lb 9.6 oz (69.219 kg)  BMI 27.04 kg/m2 WF  SKIN: warm and  Dry without overt rashes, tattoos and scars  HEAD and Neck: without JVD, Head and scalp: normal Eyes:No scleral icterus. Fundi normal, eye movements normal. Ears: Auricle normal, canal normal, Tympanic membranes normal, insufflation normal. Nose: normal Throat: normal Neck & thyroid: normal  CHEST & LUNGS: Chest wall: normal Lungs: Clear  CVS: Reveals the PMI to be normally located. Regular rhythm, First and Second Heart sounds are normal,  absence of murmurs, rubs or gallops. Peripheral vasculature: Radial pulses: normal Dorsal pedis pulses: normal Posterior pulses: normal  ABDOMEN:  Appearance: tender in the epigastrium. Benign, no organomegaly, no masses, no Abdominal Aortic enlargement. No Guarding , no rebound. No Bruits. Bowel sounds: normal  RECTAL: N/A GU: N/A  EXTREMETIES: nonedematous.  MUSCULOSKELETAL:  Spine: thoracolumbar scoliosis. Tender at approximately T10 Joints: intact  NEUROLOGIC: oriented to time,place and person; nonfocal. Results for orders placed during the hospital encounter of 12/29/13  CBC WITH DIFFERENTIAL      Result Value Ref Range   WBC 6.8  4.0 - 10.5 K/uL   RBC 4.59  3.87 - 5.11 MIL/uL   Hemoglobin 14.8  12.0 - 15.0 g/dL   HCT 43.4  36.0 - 46.0 %   MCV 94.6  78.0 - 100.0 fL   MCH 32.2  26.0 - 34.0 pg   MCHC 34.1  30.0 - 36.0 g/dL    RDW 12.3  11.5 - 15.5 %   Platelets 276  150 - 400 K/uL   Neutrophils Relative % 51  43 - 77 %   Neutro Abs 3.5  1.7 - 7.7 K/uL   Lymphocytes Relative 40  12 - 46 %   Lymphs Abs 2.7  0.7 - 4.0 K/uL   Monocytes Relative 7  3 - 12 %   Monocytes Absolute 0.5  0.1 - 1.0 K/uL   Eosinophils Relative 2  0 - 5 %   Eosinophils Absolute 0.1  0.0 - 0.7 K/uL   Basophils Relative 1  0 - 1 %  Basophils Absolute 0.0  0.0 - 0.1 K/uL  COMPREHENSIVE METABOLIC PANEL      Result Value Ref Range   Sodium 139  137 - 147 mEq/L   Potassium 4.2  3.7 - 5.3 mEq/L   Chloride 100  96 - 112 mEq/L   CO2 28  19 - 32 mEq/L   Glucose, Bld 110 (*) 70 - 99 mg/dL   BUN 14  6 - 23 mg/dL   Creatinine, Ser 0.68  0.50 - 1.10 mg/dL   Calcium 9.7  8.4 - 10.5 mg/dL   Total Protein 7.6  6.0 - 8.3 g/dL   Albumin 4.2  3.5 - 5.2 g/dL   AST 26  0 - 37 U/L   ALT 38 (*) 0 - 35 U/L   Alkaline Phosphatase 84  39 - 117 U/L   Total Bilirubin 0.5  0.3 - 1.2 mg/dL   GFR calc non Af Amer >90  >90 mL/min   GFR calc Af Amer >90  >90 mL/min  URINALYSIS, ROUTINE W REFLEX MICROSCOPIC      Result Value Ref Range   Color, Urine YELLOW  YELLOW   APPearance CLEAR  CLEAR   Specific Gravity, Urine 1.010  1.005 - 1.030   pH 5.5  5.0 - 8.0   Glucose, UA NEGATIVE  NEGATIVE mg/dL   Hgb urine dipstick NEGATIVE  NEGATIVE   Bilirubin Urine NEGATIVE  NEGATIVE   Ketones, ur NEGATIVE  NEGATIVE mg/dL   Protein, ur NEGATIVE  NEGATIVE mg/dL   Urobilinogen, UA 0.2  0.0 - 1.0 mg/dL   Nitrite NEGATIVE  NEGATIVE   Leukocytes, UA NEGATIVE  NEGATIVE  D-DIMER, QUANTITATIVE      Result Value Ref Range   D-Dimer, Quant <0.27  0.00 - 0.48 ug/mL-FEU    ASSESSMENT:  HYPERTENSION, BENIGN  HYPERCHOLESTEROLEMIA, PURE  FATTY LIVER DISEASE  COPD, MILD  Rosacea  HYPOTHYROIDISM NOS  GERD  Thoracic radiculopathy - Plan: DG Thoracic Spine 2 View, MR Thoracic Spine Wo Contrast  Scoliosis (and kyphoscoliosis), idiopathic - Plan: MR Thoracic Spine Wo  Contrast  Abdominal pain, chronic, epigastric - Plan: Hepatic function panel, Lipase, Amylase, Ambulatory referral to Gastroenterology  PLAN:  Orders Placed This Encounter  Procedures  . DG Thoracic Spine 2 View    Standing Status: Future     Number of Occurrences: 1     Standing Expiration Date: 03/02/2015    Order Specific Question:  Reason for Exam (SYMPTOM  OR DIAGNOSIS REQUIRED)    Answer:  lower thoracic vertebral pain at T10    Order Specific Question:  Is the patient pregnant?    Answer:  No    Order Specific Question:  Preferred imaging location?    Answer:  Internal  . MR Thoracic Spine Wo Contrast    Standing Status: Future     Number of Occurrences:      Standing Expiration Date: 03/03/2015    Order Specific Question:  Reason for Exam (SYMPTOM  OR DIAGNOSIS REQUIRED)    Answer:  T10 radicular pain. recurrent severe . with scoliosis and kyphosis    Order Specific Question:  Is the patient pregnant?    Answer:  No    Order Specific Question:  Preferred imaging location?    Answer:  [facility]    Order Specific Question:  Does the patient have a pacemaker, internal devices, implants, aneury    Answer:  No  . Hepatic function panel  . Lipase  . Amylase  . Ambulatory referral  to Gastroenterology    Referral Priority:  Routine    Referral Type:  Consultation    Referral Reason:  Specialty Services Required    Requested Specialty:  Gastroenterology    Number of Visits Requested:  1  [facility] reading (PRIMARY) by  Dr. Jacelyn Grip: thoracolumbar kyphoscoliosis. With degenerative changes at T9 to T12. No acute findings.  discussed with patient: will do lipase and amylase level. Recheck liver panel. Order MRI of thoracic spine, and consult GI in regards to the persistent abdominal pain and fatty liver.                             Heat to the spine.  Low fat diet with more complex carbs/ vegetarian diet. With weight loss and exercise needed in view of the fatty  liver..  No orders of the defined types were placed in this encounter.   There are no discontinued medications. Return in about 4 weeks (around 01/28/2014) for Recheck medical problems.  Trelyn Vanderlinde P. Jacelyn Grip, M.D.

## 2014-01-01 LAB — HEPATIC FUNCTION PANEL
ALT: 38 IU/L — ABNORMAL HIGH (ref 0–32)
AST: 24 IU/L (ref 0–40)
Albumin: 4.8 g/dL (ref 3.5–5.5)
Alkaline Phosphatase: 83 IU/L (ref 39–117)
Bilirubin, Direct: 0.12 mg/dL (ref 0.00–0.40)
Total Bilirubin: 0.5 mg/dL (ref 0.0–1.2)
Total Protein: 6.9 g/dL (ref 6.0–8.5)

## 2014-01-01 LAB — LIPASE: Lipase: 34 U/L (ref 0–59)

## 2014-01-01 LAB — AMYLASE: Amylase: 61 U/L (ref 31–124)

## 2014-01-02 ENCOUNTER — Encounter: Payer: Self-pay | Admitting: Gastroenterology

## 2014-01-08 ENCOUNTER — Ambulatory Visit (HOSPITAL_COMMUNITY)
Admission: RE | Admit: 2014-01-08 | Discharge: 2014-01-08 | Disposition: A | Discharge status: Home / Self Care | Payer: BC Managed Care – PPO | Source: Ambulatory Visit | Attending: Family Medicine | Admitting: Family Medicine

## 2014-01-08 ENCOUNTER — Encounter (HOSPITAL_COMMUNITY): Payer: Self-pay

## 2014-01-08 DIAGNOSIS — M545 Low back pain, unspecified: Secondary | ICD-10-CM | POA: Insufficient documentation

## 2014-01-08 DIAGNOSIS — M5124 Other intervertebral disc displacement, thoracic region: Secondary | ICD-10-CM | POA: Insufficient documentation

## 2014-01-08 DIAGNOSIS — M4120 Other idiopathic scoliosis, site unspecified: Secondary | ICD-10-CM | POA: Insufficient documentation

## 2014-01-08 DIAGNOSIS — IMO0002 Reserved for concepts with insufficient information to code with codable children: Secondary | ICD-10-CM | POA: Insufficient documentation

## 2014-01-08 DIAGNOSIS — M5414 Radiculopathy, thoracic region: Secondary | ICD-10-CM

## 2014-01-13 ENCOUNTER — Telehealth: Payer: Self-pay | Admitting: Family Medicine

## 2014-01-14 NOTE — Telephone Encounter (Signed)
Pt notified of mri results

## 2014-01-27 ENCOUNTER — Telehealth: Payer: Self-pay | Admitting: Family Medicine

## 2014-01-27 DIAGNOSIS — E039 Hypothyroidism, unspecified: Secondary | ICD-10-CM

## 2014-01-27 DIAGNOSIS — I10 Essential (primary) hypertension: Secondary | ICD-10-CM

## 2014-01-28 MED ORDER — LEVOTHYROXINE SODIUM 75 MCG PO TABS: 75.0000 ug | ORAL_TABLET | Freq: Every day | ORAL | Status: DC

## 2014-01-28 MED ORDER — LISINOPRIL-HYDROCHLOROTHIAZIDE 10-12.5 MG PO TABS: ORAL_TABLET | ORAL | Status: DC

## 2014-01-28 NOTE — Telephone Encounter (Signed)
done

## 2014-02-14 ENCOUNTER — Ambulatory Visit: Payer: BC Managed Care – PPO | Admitting: Gastroenterology

## 2014-03-11 ENCOUNTER — Ambulatory Visit: Payer: BC Managed Care – PPO | Admitting: Obstetrics & Gynecology

## 2014-04-04 ENCOUNTER — Ambulatory Visit: Payer: BC Managed Care – PPO | Admitting: Gastroenterology

## 2014-04-08 ENCOUNTER — Encounter: Payer: Self-pay | Admitting: Obstetrics & Gynecology

## 2014-04-08 ENCOUNTER — Ambulatory Visit (INDEPENDENT_AMBULATORY_CARE_PROVIDER_SITE_OTHER): Payer: BC Managed Care – PPO | Admitting: Obstetrics & Gynecology

## 2014-04-08 VITALS — BP 128/93 | HR 76 | Resp 16 | Ht 63.5 in | Wt 150.0 lb

## 2014-04-08 DIAGNOSIS — Z124 Encounter for screening for malignant neoplasm of cervix: Secondary | ICD-10-CM

## 2014-04-08 DIAGNOSIS — IMO0002 Reserved for concepts with insufficient information to code with codable children: Secondary | ICD-10-CM | POA: Insufficient documentation

## 2014-04-08 DIAGNOSIS — Z01419 Encounter for gynecological examination (general) (routine) without abnormal findings: Secondary | ICD-10-CM

## 2014-04-08 DIAGNOSIS — Z1151 Encounter for screening for human papillomavirus (HPV): Secondary | ICD-10-CM

## 2014-04-08 MED ORDER — OSPEMIFENE 60 MG PO TABS: 1.0000 | ORAL_TABLET | Freq: Every day | ORAL | Status: DC

## 2014-04-08 NOTE — Patient Instructions (Signed)
Dyspareunia Dyspareunia is pain during sexual intercourse. It is most common in women, but it also happens in men.  CAUSES  Female The pain from this condition is usually felt when anything is put into the vagina, but any part of the genitals may cause pain during sex. Even sitting or wearing pants can cause pain. Sometimes, a cause cannot be found. Some causes of pain during intercourse are:  Infections of the skin around the vagina.  Vaginal infections, such as a yeast, bacterial, or viral infection.  Vaginismus. This is the inability to have anything put in the vagina even when the woman wants it to happen. There is an automatic muscle contraction and pain. The pain of the muscle contraction can be so severe that intercourse is impossible.  Allergic reaction from spermicides, semen, condoms, scented tampons, soaps, douches, and vaginal sprays.  A fluid-filled sac (cyst) on the Bartholin or Skene glands, located at the opening of the vagina.  Scar tissue in the vagina from a surgically enlarged opening (episiotomy) or tearing after delivering a baby.  Vaginal dryness. This is more common in menopause. The normal secretions of the vagina are decreased. Changes in estrogen levels and increased difficulty becoming aroused can cause painful sex. Vaginal dryness can also happen when taking birth control pills.  Thinning of the tissue (atrophy) of the vulva and vagina. This makes the area thinner, smaller, unable to stretch to accommodate a penis, and prone to infection and tearing.  Vulvar vestibulitis or vestibulodynia.This is a condition that causes pain involving the area around the entrance to the vagina.The most common cause in young women is birth control pills.Women with low estrogen levels (postmenopausal women) may also experience this.Other causes include allergic reactions, too many nerve endings, skin conditions, and pelvic muscles that cannot relax.  Vulvar dermatoses. This  includes skin conditions such as lichen sclerosus and lichen planus.  Lack of foreplay to lubricate the vagina. This can cause vaginal dryness.  Noncancerous tumors (fibroids) in the uterus.  Uterus lining tissue growing outside the uterus (endometriosis).  Pregnancy that starts in the fallopian tube (tubal pregnancy).  Pregnancy or breastfeeding your baby. This can cause vaginal dryness.  A tilting or prolapse of the uterus. Prolapse is when weak and stretched muscles around the uterus allow it to fall into the vagina.  Problems with the ovaries, cysts, or scar tissue. This may be worse with certain sexual positions.  Previous surgeries causing adhesions or scar tissue in the vagina or pelvis.  Bladder and intestinal problems.  Psychological problems (such as depression or anxiety). This may make pain worse.  Negative attitudes about sex, experiencing rape, sexual assault, and misinformation about sex. These issues are often related to some types of pain.  Previous pelvic infection, causing scar tissue in the pelvis and on the female organs.  Cyst or tumor on the ovary.  Cancer of the female organs.  Certain medicines.  Medical problems such as diabetes, arthritis, or thyroid disease. Female In men, there are many physical causes of sexual discomfort. Some causes of pain during intercourse are:  Infections of the prostate, bladder, or seminal vesicles. This can cause pain after ejaculation.  An inflamed bladder (interstitial cystitis). This may cause pain from ejaculation.  Gonorrheal infections. This may cause pain during ejaculation.  An inflamed urethra (urethritis) or inflamed prostate (prostatitis). This can make genital stimulation painful or uncomfortable.  Deformities of the penis, such as Peyronie's disease.  A tight foreskin.  Cancer of the female organs.    Psychological problems. This may make pain worse. DIAGNOSIS   Your caregiver will take a history and  have you describe where the pain is located (outside the vagina, in the vagina, in the pelvis). You may be asked when you experience pain, such as with penetration or with thrusting.  Following this, your caregiver will do a physical exam. Let your caregiver know if the exam is too painful.  During the final part of the female exam, your caregiver will feel your uterus and ovaries with one hand on the abdomen and one finger in your vagina. This is a pelvic exam.  Blood tests, a Pap test, cultures for infection, an ultrasound test, and X-rays may be done. You may need to see a specialist for female problems (gynecologist).  Your caregiver may do a CT scan, MRI, or laparoscopy. Laparoscopy is a procedure to look into the pelvis with a lighted tube, through a cut (incision) in the abdomen. TREATMENT  Your caregiver can help you determine the best course of treatment. Sometimes, more testing is done. Continue with the suggested testing until your caregiver feels sure about your diagnosis and how to treat it. Sometimes, it is difficult to find the reason for the pain. The search for the cause and treatment can be frustrating. Treatment often takes several weeks to a few months before you notice any improvement. You may also need to avoid sexual activity until symptoms improve.Continuing to have sex when it hurts can delay healing and actually make the problem worse. The treatment depends on the cause of the pain. Treatment may include:  Medicines such as antibiotics, vaginal or skin creams, hormones, or antidepressants.  Minor or major surgery.  Psychological counseling or group therapy.  Kegel exercises and vaginal dilators to help certain cases of vaginismus (spasms). Do this only if recommended by your caregiver.Kegel exercises can make some problems worse.  Applying lubrication as recommended by your caregiver if you have dryness.  Sex therapy for you and your sex partner. It is common for  the pain to continue after the reason for the pain has been treated. Some reasons for this include a conditioned response. This means the person having the pain becomes so familiar with the pain that the pain continues as a response, even though the cause is removed. Sex therapy can help with this problem. HOME CARE INSTRUCTIONS   Follow your caregiver's instructions about taking medicines, tests, counseling, and follow-up treatment.  Do not use scented tampons, douches, vaginal sprays, or soaps.  Use water-based lubricants for dryness. Oil lubricants can cause irritation.  Do not use spermicides or condoms that irritate you.  Openly discuss with your partner your sexual experience, your desires, foreplay, and different sexual positions for a more comfortable and enjoyable sexual relationship.  Join group sessions for therapy, if needed.  Practice safe sex at all times.  Empty your bladder before having intercourse.  Try different positions during sexual intercourse.  Take over-the-counter pain medicine recommended by your caregiver before having sexual intercourse.  Do not wear pantyhose. Knee-high and thigh-high hose are okay.  Avoid scrubbing your vulva with a washcloth. Wash the area gently and pat dry with a towel. SEEK MEDICAL CARE IF:   You develop vaginal bleeding after sexual intercourse.  You develop a lump at the opening of your vagina, even if it is not painful.  You have abnormal vaginal discharge.  You have vaginal dryness.  You have itching or irritation of the vulva or vagina.  You   develop a rash or reaction to your medicine. SEEK IMMEDIATE MEDICAL CARE IF:   You develop severe abdominal pain during or shortly after sexual intercourse. You could have a ruptured ovarian cyst or ruptured tubal pregnancy.  You have a fever.  You have painful or bloody urination.  You have painful sexual intercourse, and you never had it before.  You pass out after having  sexual intercourse. Document Released: 10/30/2007 Document Revised: 01/02/2012 Document Reviewed: 01/10/2011 [employer] Patient Information 2014 [employer].  

## 2014-04-08 NOTE — Progress Notes (Signed)
Patient ID: Stacy [name], female   DOB: 1956-01-02, 58 y.o.   MRN: [id number] Subjective:     Stacy [name] is a 58 y.o. female here for a routine exam.  Current complaints: vaginal dryness with dyspareunia.  Personal health questionnaire reviewed: yes.   Gynecologic History No LMP recorded. Patient has had an ablation. Contraception: tubal ligation Last Pap: 2012. Results were: normal Last mammogram: 2013. Results were: normal  Obstetric History OB History  Gravida Para Term Preterm AB SAB TAB Ectopic Multiple Living  3 2 2  1 1    2     # Outcome Date GA Lbr Len/2nd Weight Sex Delivery Anes PTL Lv  3 SAB           2 TRM           1 TRM                The following portions of the patient's history were reviewed and updated as appropriate: allergies, current medications, past family history, past medical history, past social history, past surgical history and problem list.  Review of Systems Pertinent items are noted in HPI.    Objective:    BP 128/93  Pulse 76  Resp 16  Ht 5' 3.5" (1.613 m)  Wt 68.04 kg (150 lb)  BMI 26.15 kg/m2  General Appearance:    Alert, cooperative, no distress, appears stated age  Head:    Normocephalic, without obvious abnormality, atraumatic           Throat:   Lips, mucosa, and tongue normal; teeth and gums normal  Neck:   Supple, symmetrical, trachea midline, no adenopathy;    thyroid:  no enlargement/tenderness/nodules; no carotid   bruit or JVD  Back:     Symmetric, no curvature, ROM normal, no CVA tenderness  Lungs:     Clear to auscultation bilaterally, respirations unlabored  Chest Wall:    No tenderness or deformity   Heart:    Regular rate and rhythm, S1 and S2 normal, no murmur, rub   or gallop  Breast Exam:    No tenderness, masses, or nipple abnormality  Abdomen:     Soft, non-tender, bowel sounds active all four quadrants,    no masses, no organomegaly  Genitalia:    Normal female without lesion, discharge or tenderness   Mild atrophy  no mass  Extremities:   Extremities normal, atraumatic, no cyanosis or edema  Pulses:   2+ and symmetric all extremities  Skin:   Skin color, texture, turgor normal, no rashes or lesions  Lymph nodes:   Cervical, supraclavicular, and axillary nodes normal  Neurologic:   CNII-XII intact, normal strength, sensation and reflexes    throughout      Assessment:    Healthy female exam. dyspareunia due to vaginal dryness postmenopause   Plan:    Hormone replacement therapy: Osphena.    Mammogram  Woodroe Mode, MD

## 2014-04-11 ENCOUNTER — Other Ambulatory Visit: Payer: BC Managed Care – PPO

## 2014-04-14 LAB — CYTOLOGY - PAP

## 2014-04-15 ENCOUNTER — Other Ambulatory Visit: Payer: BC Managed Care – PPO

## 2014-08-05 ENCOUNTER — Other Ambulatory Visit: Payer: Self-pay | Admitting: *Deleted

## 2014-08-05 ENCOUNTER — Other Ambulatory Visit: Payer: Self-pay

## 2014-08-05 DIAGNOSIS — I10 Essential (primary) hypertension: Secondary | ICD-10-CM

## 2014-08-05 MED ORDER — LISINOPRIL-HYDROCHLOROTHIAZIDE 10-12.5 MG PO TABS: ORAL_TABLET | ORAL | Status: DC

## 2014-08-07 ENCOUNTER — Other Ambulatory Visit: Payer: Self-pay | Admitting: *Deleted

## 2014-08-07 DIAGNOSIS — E039 Hypothyroidism, unspecified: Secondary | ICD-10-CM

## 2014-08-07 MED ORDER — LEVOTHYROXINE SODIUM 75 MCG PO TABS: 75.0000 ug | ORAL_TABLET | Freq: Every day | ORAL | Status: DC

## 2014-08-07 NOTE — Telephone Encounter (Signed)
Last thyroid 2/15.

## 2014-08-25 ENCOUNTER — Encounter: Payer: Self-pay | Admitting: Obstetrics & Gynecology

## 2015-05-20 ENCOUNTER — Encounter: Payer: Self-pay | Admitting: Cardiology

## 2015-05-20 ENCOUNTER — Ambulatory Visit (INDEPENDENT_AMBULATORY_CARE_PROVIDER_SITE_OTHER): Payer: BLUE CROSS/BLUE SHIELD | Admitting: Cardiology

## 2015-05-20 VITALS — BP 125/79 | HR 62 | Ht 63.5 in | Wt 152.4 lb

## 2015-05-20 DIAGNOSIS — R079 Chest pain, unspecified: Secondary | ICD-10-CM | POA: Insufficient documentation

## 2015-05-20 DIAGNOSIS — R0600 Dyspnea, unspecified: Secondary | ICD-10-CM | POA: Diagnosis not present

## 2015-05-20 DIAGNOSIS — E7800 Pure hypercholesterolemia, unspecified: Secondary | ICD-10-CM

## 2015-05-20 DIAGNOSIS — I10 Essential (primary) hypertension: Secondary | ICD-10-CM

## 2015-05-20 DIAGNOSIS — R072 Precordial pain: Secondary | ICD-10-CM | POA: Diagnosis not present

## 2015-05-20 NOTE — Assessment & Plan Note (Signed)
Blood pressure controlled. Continue present medications. 

## 2015-05-20 NOTE — Progress Notes (Signed)
HPI: 59 yo female for evaluation of chest pain. Catheterization 2010 showed EF 60% and no significant coronary artery disease. Patient describes occasional chest pain since her catheterization in 2010. It is substernal with radiation to her back. It occurs with stress and with exertion similar to her symptoms prior to catheterization. On July 23 she was in [place]. She was climbing steps following a show at the at the theater. She developed dyspnea, diaphoresis and chest pain. The pain lasted approximately one hour and resolved. Not pleuritic or positional. She then felt fatigued the following 48 hours. She also describes dyspnea on exertion and orthopnea, PND or pedal edema. No claudication or syncope.  Current Outpatient Prescriptions  Medication Sig Dispense Refill  . albuterol (PROVENTIL HFA;VENTOLIN HFA) 108 (90 BASE) MCG/ACT inhaler Inhale 2 puffs into the lungs every 6 (six) hours as needed for wheezing or shortness of breath.     . levothyroxine (SYNTHROID, LEVOTHROID) 75 MCG tablet Take 1 tablet (75 mcg total) by mouth daily before breakfast. (Patient taking differently: Take 100 mcg by mouth daily before breakfast. ) 90 tablet 1  . losartan (COZAAR) 25 MG tablet Take 25 mg by mouth daily.  3  . Ospemifene (OSPHENA) 60 MG TABS Take 1 tablet by mouth daily with breakfast. 30 tablet 11   No current facility-administered medications for this visit.    Allergies  Allergen Reactions  . Influenza Vaccines   . Avelox [Moxifloxacin Hcl In Nacl] Hives  . Ciprofloxacin     REACTION: Rash  . Livalo [Pitavastatin]   . Moxifloxacin     REACTION: RASH  . Bactrim [Sulfamethoxazole-Trimethoprim] Rash  . Statins Other (See Comments)    myalgias  . Sulfamethoxazole-Trimethoprim Rash    REACTION: Rash     Past Medical History  Diagnosis Date  . GERD (gastroesophageal reflux disease)   . Perimenopausal   . COPD, mild     Spirometerey 3/08  . Hypothyroid Since 2003  . IBS (irritable  bowel syndrome)   . Fatty liver disease, nonalcoholic   . Hyperlipidemia   . Hypertension   . Asthma     Past Surgical History  Procedure Laterality Date  . Ltcs      x2  . Spirometry  01/05/2007    FVC 68% predicted; FEV1 44% predicted; FEV1/FVC 63% predicted = moderate obstruction with low vital capacity possibly due to restriction  . Cesarean section    . Tubal ligation    . Ablation      History   Social History  . Marital Status: Divorced    Spouse Name: N/A  . Number of Children: 2  . Years of Education: N/A   Occupational History  . Works in Science writer    Social History Main Topics  . Smoking status: Former Smoker -- 0.50 packs/day for 30 years    Types: Cigarettes    Quit date: 10/25/2007  . Smokeless tobacco: Never Used  . Alcohol Use: Yes     Comment: Occasional-once a year  . Drug Use: No  . Sexual Activity: Yes    Birth Control/ Protection: Surgical   Other Topics Concern  . Not on file   Social History Narrative   Divorced from [place] from [place] in 2007   2 children in [place], 4 grandkids   Does not exercise    Family History  Problem Relation Age of Onset  . Hypertension Father   . Hyperlipidemia Father   . Heart disease Father  Died at age 20 of MI  . Raynaud syndrome Sister   . CAD Brother   . Heart disease Sister     ROS: Jaw and hip pain but no fevers or chills, productive cough, hemoptysis, dysphasia, odynophagia, melena, hematochezia, dysuria, hematuria, rash, seizure activity, orthopnea, PND, pedal edema, claudication. Remaining systems are negative.  Physical Exam:   Blood pressure 125/79, pulse 62, height 5' 3.5" (1.613 m), weight 152 lb 6.4 oz (69.128 kg).  General:  Well developed/well nourished in NAD Skin warm/dry Patient not depressed No peripheral clubbing Back-normal HEENT-normal/normal eyelids Neck supple/normal carotid upstroke bilaterally; no bruits; no JVD; no thyromegaly chest - CTA/ normal  expansion CV - RRR/normal S1 and S2; no murmurs, rubs or gallops;  PMI nondisplaced Abdomen -NT/ND, no HSM, no mass, + bowel sounds, no bruit 2+ femoral pulses, no bruits Ext-no edema, chords, 2+ DP Neuro-grossly nonfocal  ECG sinus rhythm at a rate of 56. No ST changes.

## 2015-05-20 NOTE — Patient Instructions (Signed)
Your physician recommends that you schedule a follow-up appointment in: [place] has requested that you have an echocardiogram. Echocardiography is a painless test that uses sound waves to create images of your heart. It provides your doctor with information about the size and shape of your heart and how well your heart's chambers and valves are working. This procedure takes approximately one hour. There are no restrictions for this procedure.    Your physician has requested that you have an exercise tolerance test. For further information please visit [email or web address]. Please also follow instruction sheet, as given.    Exercise Stress Electrocardiogram An exercise stress electrocardiogram is a test to check how blood flows to your heart. It is done to find areas of poor blood flow. You will need to walk on a treadmill for this test. The electrocardiogram will record your heartbeat when you are at rest and when you are exercising. BEFORE THE PROCEDURE  Do not have drinks with caffeine or foods with caffeine for 24 hours before the test, or as told by your doctor. This includes coffee, tea (even decaf tea), sodas, chocolate, and cocoa.  Follow your doctor's instructions about eating and drinking before the test.  Ask your doctor what medicines you should or should not take before the test. Take your medicines with water unless told by your doctor not to.  If you use an inhaler, bring it with you to the test.  Bring a snack to eat after the test.  Do not  smoke for 4 hours before the test.  Do not put lotions, powders, creams, or oils on your chest before the test.  Wear comfortable shoes and clothing. PROCEDURE  You will have patches put on your chest. Small areas of your chest may need to be shaved. Wires will be connected to the patches.  Your heart rate will be watched while you are resting and while you are exercising.  You will walk on the  treadmill. The treadmill will slowly get faster to raise your heart rate.  The test will take about 1-2 hours. AFTER THE PROCEDURE  Your heart rate and blood pressure will be watched after the test.  You may return to your normal diet, activities, and medicines or as told by your doctor. Document Released: 03/28/2008 Document Revised: 02/24/2014 Document Reviewed: 06/17/2013 [facility] Patient Information 2015 [place], [place]. This information is not intended to replace advice given to you by your health care provider. Make sure you discuss any questions you have with your health care provider.

## 2015-05-20 NOTE — Assessment & Plan Note (Signed)
Symptoms with both typical and atypical features. Previous catheterization revealed nonobstructive coronary disease but was 6 years previous. Electrocardiogram shows no ST changes. She had a more severe episode on July 23. We will plan to proceed with an exercise treadmill for risk stratification. Low threshold to proceed with cardiac catheterization. Echocardiogram to assess LV function and wall motion.

## 2015-05-20 NOTE — Assessment & Plan Note (Signed)
Management per primary care. 

## 2015-06-02 ENCOUNTER — Telehealth (HOSPITAL_COMMUNITY): Payer: Self-pay

## 2015-06-02 ENCOUNTER — Telehealth (HOSPITAL_COMMUNITY): Payer: Self-pay | Admitting: *Deleted

## 2015-06-02 NOTE — Telephone Encounter (Signed)
Encounter complete. 

## 2015-06-03 ENCOUNTER — Ambulatory Visit (HOSPITAL_BASED_OUTPATIENT_CLINIC_OR_DEPARTMENT_OTHER)
Admission: RE | Admit: 2015-06-03 | Discharge: 2015-06-03 | Disposition: A | Discharge status: Home / Self Care | Payer: BLUE CROSS/BLUE SHIELD | Source: Ambulatory Visit | Attending: Cardiology | Admitting: Cardiology

## 2015-06-03 DIAGNOSIS — I10 Essential (primary) hypertension: Secondary | ICD-10-CM | POA: Insufficient documentation

## 2015-06-03 DIAGNOSIS — R0600 Dyspnea, unspecified: Secondary | ICD-10-CM | POA: Diagnosis not present

## 2015-06-03 DIAGNOSIS — E785 Hyperlipidemia, unspecified: Secondary | ICD-10-CM | POA: Insufficient documentation

## 2015-06-03 DIAGNOSIS — R079 Chest pain, unspecified: Secondary | ICD-10-CM | POA: Diagnosis not present

## 2015-06-03 DIAGNOSIS — I517 Cardiomegaly: Secondary | ICD-10-CM | POA: Insufficient documentation

## 2015-06-03 NOTE — Progress Notes (Signed)
*  PRELIMINARY RESULTS* Echocardiogram 2D Echocardiogram has been performed.  Leavy Cella 06/03/2015, 9:19 AM

## 2015-06-04 ENCOUNTER — Ambulatory Visit (HOSPITAL_COMMUNITY)
Admission: RE | Admit: 2015-06-04 | Discharge: 2015-06-04 | Disposition: A | Discharge status: Home / Self Care | Payer: BLUE CROSS/BLUE SHIELD | Source: Ambulatory Visit | Attending: Cardiology | Admitting: Cardiology

## 2015-06-04 DIAGNOSIS — R0600 Dyspnea, unspecified: Secondary | ICD-10-CM | POA: Diagnosis not present

## 2015-06-04 DIAGNOSIS — R079 Chest pain, unspecified: Secondary | ICD-10-CM

## 2015-06-04 LAB — EXERCISE TOLERANCE TEST
CHL CUP MPHR: 163 {beats}/min
CHL CUP RESTING HR STRESS: 85 {beats}/min
CSEPEW: 7 METS
Exercise duration (min): 5 min
Peak HR: 150 {beats}/min
Percent HR: 92 %
RPE: 16

## 2015-06-08 NOTE — Progress Notes (Signed)
      HPI: FU chest pain. Catheterization 2010 showed EF 60% and no significant coronary artery disease. Patient describes occasional chest pain since her catheterization in 2010. Exercise treadmill August 2016 negative. Echocardiogram August 2016 showed normal LV function and grade 1 diastolic dysfunction. Since she was last seen she continues to have dyspnea on exertion which she states is unusual. No orthopnea or PND. Minimal pedal edema. Following her treadmill she did have chest heaviness that evening.  Current Outpatient Prescriptions  Medication Sig Dispense Refill  . albuterol (PROVENTIL HFA;VENTOLIN HFA) 108 (90 BASE) MCG/ACT inhaler Inhale 2 puffs into the lungs every 6 (six) hours as needed for wheezing or shortness of breath.     . levothyroxine (SYNTHROID, LEVOTHROID) 100 MCG tablet Take 100 mcg by mouth daily before breakfast.    . losartan (COZAAR) 25 MG tablet Take 25 mg by mouth daily.  3  . montelukast (SINGULAIR) 10 MG tablet Take 10 mg by mouth at bedtime.    . Ospemifene (OSPHENA) 60 MG TABS Take 1 tablet by mouth daily with breakfast. 30 tablet 11   No current facility-administered medications for this visit.     Past Medical History  Diagnosis Date  . GERD (gastroesophageal reflux disease)   . Perimenopausal   . COPD, mild     Spirometerey 3/08  . Hypothyroid Since 2003  . IBS (irritable bowel syndrome)   . Fatty liver disease, nonalcoholic   . Hyperlipidemia   . Hypertension   . Asthma     Past Surgical History  Procedure Laterality Date  . Ltcs      x2  . Spirometry  01/05/2007    FVC 68% predicted; FEV1 44% predicted; FEV1/FVC 63% predicted = moderate obstruction with low vital capacity possibly due to restriction  . Cesarean section    . Tubal ligation    . Ablation      Social History   Social History  . Marital Status: Divorced    Spouse Name: N/A  . Number of Children: 2  . Years of Education: N/A   Occupational History  . Works in  Science writer    Social History Main Topics  . Smoking status: Former Smoker -- 0.50 packs/day for 30 years    Types: Cigarettes    Quit date: 10/25/2007  . Smokeless tobacco: Never Used  . Alcohol Use: 0.0 oz/week    0 Standard drinks or equivalent per week     Comment: Occasional-once a year  . Drug Use: No  . Sexual Activity: Yes    Birth Control/ Protection: Surgical   Other Topics Concern  . Not on file   Social History Narrative   Divorced from [place] from [place] in 2007   2 children in [place], 4 grandkids   Does not exercise    ROS: no fevers or chills, productive cough, hemoptysis, dysphasia, odynophagia, melena, hematochezia, dysuria, hematuria, rash, seizure activity, orthopnea, PND, pedal edema, claudication. Remaining systems are negative.  Physical Exam: Well-developed well-nourished in no acute distress.  Skin is warm and dry.  HEENT is normal.  Neck is supple.  Chest is clear to auscultation with normal expansion.  Cardiovascular exam is regular rate and rhythm.  Abdominal exam nontender or distended. No masses palpated. Extremities show no edema. neuro grossly intact

## 2015-06-10 ENCOUNTER — Other Ambulatory Visit: Payer: Self-pay | Admitting: Cardiology

## 2015-06-10 ENCOUNTER — Encounter: Payer: Self-pay | Admitting: Cardiology

## 2015-06-10 ENCOUNTER — Ambulatory Visit (INDEPENDENT_AMBULATORY_CARE_PROVIDER_SITE_OTHER): Payer: BLUE CROSS/BLUE SHIELD | Admitting: Cardiology

## 2015-06-10 ENCOUNTER — Encounter: Payer: Self-pay | Admitting: *Deleted

## 2015-06-10 VITALS — BP 110/66 | HR 66 | Ht 64.0 in | Wt 154.0 lb

## 2015-06-10 DIAGNOSIS — R072 Precordial pain: Secondary | ICD-10-CM

## 2015-06-10 DIAGNOSIS — E7800 Pure hypercholesterolemia, unspecified: Secondary | ICD-10-CM

## 2015-06-10 DIAGNOSIS — I10 Essential (primary) hypertension: Secondary | ICD-10-CM

## 2015-06-10 DIAGNOSIS — R0602 Shortness of breath: Secondary | ICD-10-CM | POA: Diagnosis not present

## 2015-06-10 LAB — BASIC METABOLIC PANEL
BUN: 9 mg/dL (ref 7–25)
CHLORIDE: 104 mmol/L (ref 98–110)
CO2: 29 mmol/L (ref 20–31)
Calcium: 9.7 mg/dL (ref 8.6–10.4)
Creat: 0.68 mg/dL (ref 0.50–1.05)
Glucose, Bld: 123 mg/dL — ABNORMAL HIGH (ref 65–99)
Potassium: 4.7 mmol/L (ref 3.5–5.3)
SODIUM: 139 mmol/L (ref 135–146)

## 2015-06-10 LAB — CBC
HCT: 42.1 % (ref 36.0–46.0)
HEMOGLOBIN: 14.4 g/dL (ref 12.0–15.0)
MCH: 32 pg (ref 26.0–34.0)
MCHC: 34.2 g/dL (ref 30.0–36.0)
MCV: 93.6 fL (ref 78.0–100.0)
MPV: 10.8 fL (ref 8.6–12.4)
Platelets: 343 10*3/uL (ref 150–400)
RBC: 4.5 MIL/uL (ref 3.87–5.11)
RDW: 12.9 % (ref 11.5–15.5)
WBC: 7.7 10*3/uL (ref 4.0–10.5)

## 2015-06-10 LAB — PROTIME-INR
INR: 0.98 (ref <1.50)
Prothrombin Time: 13 seconds (ref 11.6–15.2)

## 2015-06-10 NOTE — Patient Instructions (Signed)
Your physician has requested that you have a cardiac catheterization. Cardiac catheterization is used to diagnose and/or treat various heart conditions. Doctors may recommend this procedure for a number of different reasons. The most common reason is to evaluate chest pain. Chest pain can be a symptom of coronary artery disease (CAD), and cardiac catheterization can show whether plaque is narrowing or blocking your heart's arteries. This procedure is also used to evaluate the valves, as well as measure the blood flow and oxygen levels in different parts of your heart. For further information please visit [email or web address]. Please follow instruction sheet, as given.   Your physician recommends that you have lab work today  Your physician recommends that you schedule a follow-up appointment in: 8 weeks with dr Stanford Breed

## 2015-06-10 NOTE — Assessment & Plan Note (Signed)
Management per primary care. 

## 2015-06-10 NOTE — Assessment & Plan Note (Signed)
Patient continues to have symptoms that she is concerned about. She describes dyspnea on exertion and did have chest pain following her treadmill. She also has a strong family history of coronary disease. Given the persistence of symptoms she would like definitive evaluation. I think this is appropriate. Plan to proceed with cardiac catheterization. The risks and benefits were discussed and she agrees to proceed.

## 2015-06-10 NOTE — Assessment & Plan Note (Signed)
Blood pressure controlled. Continue present medications. 

## 2015-06-12 ENCOUNTER — Ambulatory Visit (HOSPITAL_COMMUNITY)
Admission: RE | Admit: 2015-06-12 | Discharge: 2015-06-12 | Disposition: A | Discharge status: Home / Self Care | Payer: BLUE CROSS/BLUE SHIELD | Source: Ambulatory Visit | Attending: Cardiovascular Disease | Admitting: Cardiovascular Disease

## 2015-06-12 ENCOUNTER — Encounter (HOSPITAL_COMMUNITY): Payer: Self-pay | Admitting: Cardiovascular Disease

## 2015-06-12 ENCOUNTER — Encounter (HOSPITAL_COMMUNITY)
Admission: RE | Disposition: A | Discharge status: Home / Self Care | Payer: BLUE CROSS/BLUE SHIELD | Source: Ambulatory Visit | Attending: Cardiovascular Disease

## 2015-06-12 DIAGNOSIS — Z79810 Long term (current) use of selective estrogen receptor modulators (SERMs): Secondary | ICD-10-CM | POA: Insufficient documentation

## 2015-06-12 DIAGNOSIS — I10 Essential (primary) hypertension: Secondary | ICD-10-CM | POA: Insufficient documentation

## 2015-06-12 DIAGNOSIS — R0600 Dyspnea, unspecified: Secondary | ICD-10-CM | POA: Diagnosis not present

## 2015-06-12 DIAGNOSIS — Z79899 Other long term (current) drug therapy: Secondary | ICD-10-CM | POA: Insufficient documentation

## 2015-06-12 DIAGNOSIS — R072 Precordial pain: Secondary | ICD-10-CM | POA: Insufficient documentation

## 2015-06-12 DIAGNOSIS — Z87891 Personal history of nicotine dependence: Secondary | ICD-10-CM | POA: Insufficient documentation

## 2015-06-12 DIAGNOSIS — E785 Hyperlipidemia, unspecified: Secondary | ICD-10-CM | POA: Diagnosis not present

## 2015-06-12 DIAGNOSIS — E039 Hypothyroidism, unspecified: Secondary | ICD-10-CM | POA: Diagnosis not present

## 2015-06-12 DIAGNOSIS — J45909 Unspecified asthma, uncomplicated: Secondary | ICD-10-CM | POA: Insufficient documentation

## 2015-06-12 DIAGNOSIS — J449 Chronic obstructive pulmonary disease, unspecified: Secondary | ICD-10-CM | POA: Insufficient documentation

## 2015-06-12 DIAGNOSIS — K589 Irritable bowel syndrome without diarrhea: Secondary | ICD-10-CM | POA: Insufficient documentation

## 2015-06-12 HISTORY — PX: CARDIAC CATHETERIZATION: SHX172

## 2015-06-12 SURGERY — LEFT HEART CATH AND CORONARY ANGIOGRAPHY

## 2015-06-12 MED ORDER — HEPARIN SODIUM (PORCINE) 1000 UNIT/ML IJ SOLN
INTRAMUSCULAR | Status: AC
  Filled 2015-06-12: qty 1

## 2015-06-12 MED ORDER — SODIUM CHLORIDE 0.9 % IJ SOLN: 3.0000 mL | INTRAMUSCULAR | Status: DC | PRN

## 2015-06-12 MED ORDER — MIDAZOLAM HCL 2 MG/2ML IJ SOLN
INTRAMUSCULAR | Status: AC
  Filled 2015-06-12: qty 4

## 2015-06-12 MED ORDER — SODIUM CHLORIDE 0.9 % WEIGHT BASED INFUSION
3.0000 mL/kg/h | INTRAVENOUS | Status: AC
  Administered 2015-06-12: 3 mL/kg/h via INTRAVENOUS

## 2015-06-12 MED ORDER — FENTANYL CITRATE (PF) 100 MCG/2ML IJ SOLN
INTRAMUSCULAR | Status: AC
  Filled 2015-06-12: qty 4

## 2015-06-12 MED ORDER — SODIUM CHLORIDE 0.9 % IJ SOLN: 3.0000 mL | Freq: Two times a day (BID) | INTRAMUSCULAR | Status: DC

## 2015-06-12 MED ORDER — SODIUM CHLORIDE 0.9 % IV SOLN: INTRAVENOUS | Status: AC

## 2015-06-12 MED ORDER — VERAPAMIL HCL 2.5 MG/ML IV SOLN
INTRAVENOUS | Status: AC
  Filled 2015-06-12: qty 2

## 2015-06-12 MED ORDER — SODIUM CHLORIDE 0.9 % IV SOLN: 250.0000 mL | INTRAVENOUS | Status: DC | PRN

## 2015-06-12 MED ORDER — LIDOCAINE HCL (PF) 1 % IJ SOLN
INTRAMUSCULAR | Status: DC | PRN
  Administered 2015-06-12: 08:00:00

## 2015-06-12 MED ORDER — SODIUM CHLORIDE 0.9 % WEIGHT BASED INFUSION: 1.0000 mL/kg/h | INTRAVENOUS | Status: DC

## 2015-06-12 MED ORDER — FENTANYL CITRATE (PF) 100 MCG/2ML IJ SOLN
INTRAMUSCULAR | Status: DC | PRN
  Administered 2015-06-12: 25 ug via INTRAVENOUS

## 2015-06-12 MED ORDER — MIDAZOLAM HCL 2 MG/2ML IJ SOLN
INTRAMUSCULAR | Status: DC | PRN
  Administered 2015-06-12: 2 mg via INTRAVENOUS

## 2015-06-12 MED ORDER — ASPIRIN 81 MG PO CHEW: 81.0000 mg | CHEWABLE_TABLET | ORAL | Status: DC

## 2015-06-12 MED ORDER — RADIAL COCKTAIL (HEPARIN/VERAPAMIL/LIDOCAINE/NITRO)
Status: DC | PRN
  Administered 2015-06-12: 10 mL via INTRA_ARTERIAL

## 2015-06-12 MED ORDER — IOHEXOL 350 MG/ML SOLN
INTRAVENOUS | Status: DC | PRN
  Administered 2015-06-12: 90 mL via INTRAVENOUS

## 2015-06-12 SURGICAL SUPPLY — 12 items
CATH INFINITI 5 FR JL3.5 (CATHETERS) ×3 IMPLANT
CATH INFINITI 5FR ANG PIGTAIL (CATHETERS) ×3 IMPLANT
CATH INFINITI JR4 5F (CATHETERS) ×3 IMPLANT
DEVICE RAD COMP TR BAND LRG (VASCULAR PRODUCTS) ×3 IMPLANT
GLIDESHEATH SLEND SS 6F .021 (SHEATH) ×3 IMPLANT
KIT HEART LEFT (KITS) ×3 IMPLANT
PACK CARDIAC CATHETERIZATION (CUSTOM PROCEDURE TRAY) ×3 IMPLANT
SYR MEDRAD MARK V 150ML (SYRINGE) ×3 IMPLANT
TRANSDUCER W/STOPCOCK (MISCELLANEOUS) ×3 IMPLANT
TUBING CIL FLEX 10 FLL-RA (TUBING) ×3 IMPLANT
WIRE HI TORQ VERSACORE-J 145CM (WIRE) ×3 IMPLANT
WIRE SAFE-T 1.5MM-J .035X260CM (WIRE) ×3 IMPLANT

## 2015-06-12 NOTE — Research (Signed)
CAD LAD Informed Consent   Subject Name: Stacy [name]  Subject met inclusion and exclusion criteria.  The informed consent form, study requirements and expectations were reviewed with the subject and questions and concerns were addressed prior to the signing of the consent form.  The subject verbalized understanding of the [name] requirements.  The subject agreed to participate in the CAD LAD trial and signed the informed consent.  The informed consent was obtained prior to performance of any protocol-specific procedures for the subject.  A copy of the signed informed consent was given to the subject and a copy was placed in the subject's medical record.  Shawn Lord 06/12/2015, 07:00 AM  

## 2015-06-12 NOTE — H&P (View-Only) (Signed)
      HPI: FU chest pain. Catheterization 2010 showed EF 60% and no significant coronary artery disease. Patient describes occasional chest pain since her catheterization in 2010. Exercise treadmill August 2016 negative. Echocardiogram August 2016 showed normal LV function and grade 1 diastolic dysfunction. Since she was last seen she continues to have dyspnea on exertion which she states is unusual. No orthopnea or PND. Minimal pedal edema. Following her treadmill she did have chest heaviness that evening.  Current Outpatient Prescriptions  Medication Sig Dispense Refill  . albuterol (PROVENTIL HFA;VENTOLIN HFA) 108 (90 BASE) MCG/ACT inhaler Inhale 2 puffs into the lungs every 6 (six) hours as needed for wheezing or shortness of breath.     . levothyroxine (SYNTHROID, LEVOTHROID) 100 MCG tablet Take 100 mcg by mouth daily before breakfast.    . losartan (COZAAR) 25 MG tablet Take 25 mg by mouth daily.  3  . montelukast (SINGULAIR) 10 MG tablet Take 10 mg by mouth at bedtime.    . Ospemifene (OSPHENA) 60 MG TABS Take 1 tablet by mouth daily with breakfast. 30 tablet 11   No current facility-administered medications for this visit.     Past Medical History  Diagnosis Date  . GERD (gastroesophageal reflux disease)   . Perimenopausal   . COPD, mild     Spirometerey 3/08  . Hypothyroid Since 2003  . IBS (irritable bowel syndrome)   . Fatty liver disease, nonalcoholic   . Hyperlipidemia   . Hypertension   . Asthma     Past Surgical History  Procedure Laterality Date  . Ltcs      x2  . Spirometry  01/05/2007    FVC 68% predicted; FEV1 44% predicted; FEV1/FVC 63% predicted = moderate obstruction with low vital capacity possibly due to restriction  . Cesarean section    . Tubal ligation    . Ablation      Social History   Social History  . Marital Status: Divorced    Spouse Name: N/A  . Number of Children: 2  . Years of Education: N/A   Occupational History  . Works in  Science writer    Social History Main Topics  . Smoking status: Former Smoker -- 0.50 packs/day for 30 years    Types: Cigarettes    Quit date: 10/25/2007  . Smokeless tobacco: Never Used  . Alcohol Use: 0.0 oz/week    0 Standard drinks or equivalent per week     Comment: Occasional-once a year  . Drug Use: No  . Sexual Activity: Yes    Birth Control/ Protection: Surgical   Other Topics Concern  . Not on file   Social History Narrative   Divorced from [place] from [place] in 2007   2 children in [place], 4 grandkids   Does not exercise    ROS: no fevers or chills, productive cough, hemoptysis, dysphasia, odynophagia, melena, hematochezia, dysuria, hematuria, rash, seizure activity, orthopnea, PND, pedal edema, claudication. Remaining systems are negative.  Physical Exam: Well-developed well-nourished in no acute distress.  Skin is warm and dry.  HEENT is normal.  Neck is supple.  Chest is clear to auscultation with normal expansion.  Cardiovascular exam is regular rate and rhythm.  Abdominal exam nontender or distended. No masses palpated. Extremities show no edema. neuro grossly intact

## 2015-06-12 NOTE — Interval H&P Note (Signed)
History and Physical Interval Note:  06/12/2015 7:25 AM  Stacy [name]  has presented today for cardiac cath with the diagnosis of shortness of breath/unstable angina. The various methods of treatment have been discussed with the patient and family. After consideration of risks, benefits and other options for treatment, the patient has consented to  Procedure(s): Left Heart Cath and Coronary Angiography (N/A) as a surgical intervention .  The patient's history has been reviewed, patient examined, no change in status, stable for surgery.  I have reviewed the patient's chart and labs.  Questions were answered to the patient's satisfaction.    Cath Lab Visit (complete for each Cath Lab visit)  Clinical Evaluation Leading to the Procedure:   ACS: No.  Non-ACS:    Anginal Classification: CCS II  Anti-ischemic medical therapy: No Therapy  Non-Invasive Test Results: No non-invasive testing performed  Prior CABG: No previous CABG         Eliberto Sole

## 2015-06-12 NOTE — Discharge Instructions (Signed)
Radial Site Care °Refer to this sheet in the next few weeks. These instructions provide you with information on caring for yourself after your procedure. Your caregiver may also give you more specific instructions. Your treatment has been planned according to current medical practices, but problems sometimes occur. Call your caregiver if you have any problems or questions after your procedure. °HOME CARE INSTRUCTIONS °· You may shower the day after the procedure. Remove the bandage (dressing) and gently wash the site with plain soap and water. Gently pat the site dry. °· Do not apply powder or lotion to the site. °· Do not submerge the affected site in water for 3 to 5 days. °· Inspect the site at least twice daily. °· Do not flex or bend the affected arm for 24 hours. °· No lifting over 5 pounds (2.3 kg) for 5 days after your procedure. °· Do not drive home if you are discharged the same day of the procedure. Have someone else drive you. °· You may drive 24 hours after the procedure unless otherwise instructed by your caregiver. °· Do not operate machinery or power tools for 24 hours. °· A responsible adult should be with you for the first 24 hours after you arrive home. °What to expect: °· Any bruising will usually fade within 1 to 2 weeks. °· Blood that collects in the tissue (hematoma) may be painful to the touch. It should usually decrease in size and tenderness within 1 to 2 weeks. °SEEK IMMEDIATE MEDICAL CARE IF: °· You have unusual pain at the radial site. °· You have redness, warmth, swelling, or pain at the radial site. °· You have drainage (other than a small amount of blood on the dressing). °· You have chills. °· You have a fever or persistent symptoms for more than 72 hours. °· You have a fever and your symptoms suddenly get worse. °· Your arm becomes pale, cool, tingly, or numb. °· You have heavy bleeding from the site. Hold pressure on the site and call 911. °Document Released: 11/12/2010 Document  Revised: 01/02/2012 Document Reviewed: 11/12/2010 °[employer]® Patient Information ©2015 [employer]. This information is not intended to replace advice given to you by your health care provider. Make sure you discuss any questions you have with your health care provider. ° °

## 2015-06-13 ENCOUNTER — Encounter (HOSPITAL_COMMUNITY): Payer: Self-pay | Admitting: Cardiovascular Disease

## 2015-06-15 MED FILL — Heparin Sodium (Porcine) Inj 1000 Unit/ML: INTRAMUSCULAR | Qty: 10 | Status: AC

## 2015-07-15 ENCOUNTER — Institutional Professional Consult (permissible substitution): Payer: Self-pay | Admitting: Cardiology

## 2015-08-05 ENCOUNTER — Ambulatory Visit: Payer: BLUE CROSS/BLUE SHIELD | Admitting: Cardiology

## 2015-11-24 ENCOUNTER — Other Ambulatory Visit: Payer: Self-pay | Admitting: Obstetrics and Gynecology

## 2015-11-24 ENCOUNTER — Other Ambulatory Visit (HOSPITAL_COMMUNITY)
Admission: RE | Admit: 2015-11-24 | Discharge: 2015-11-24 | Disposition: A | Discharge status: Home / Self Care | Payer: BLUE CROSS/BLUE SHIELD | Source: Ambulatory Visit | Attending: Obstetrics and Gynecology | Admitting: Obstetrics and Gynecology

## 2015-11-24 DIAGNOSIS — Z01419 Encounter for gynecological examination (general) (routine) without abnormal findings: Secondary | ICD-10-CM | POA: Diagnosis present

## 2015-11-26 LAB — CYTOLOGY - PAP

## 2016-11-25 ENCOUNTER — Other Ambulatory Visit: Payer: Self-pay | Admitting: Obstetrics and Gynecology

## 2016-11-25 ENCOUNTER — Other Ambulatory Visit (HOSPITAL_COMMUNITY)
Admission: RE | Admit: 2016-11-25 | Discharge: 2016-11-25 | Disposition: A | Discharge status: Home / Self Care | Payer: 59 | Source: Ambulatory Visit | Attending: Obstetrics and Gynecology | Admitting: Obstetrics and Gynecology

## 2016-11-25 DIAGNOSIS — Z01419 Encounter for gynecological examination (general) (routine) without abnormal findings: Secondary | ICD-10-CM | POA: Insufficient documentation

## 2016-11-29 LAB — CYTOLOGY - PAP: DIAGNOSIS: NEGATIVE

## 2017-06-07 NOTE — Progress Notes (Signed)
Subjective: VO:[id number] care HPI: Stacy [name] is a 61 y.o. female presenting to clinic today for:  1. Urinary frequency Patient reports several year history of nocturia getting up at least 3-4 times a night. She additionally notes that over the last 2 weeks she seems to be noticing having to use the restroom more throughout the afternoon. She reports at baseline she uses the restroom 4 times each morning. She noted sensation of incomplete emptying of bladder, requiring leaning forward to empty bladder. She denies sensation of vaginal prolapse. She notes that both of her sons are born via C-section. She has a history of endometrial ablation but no other pelvic surgeries. She denies any blood in her urine, foul odors, new back pain, fevers, chills, nausea, vomiting. Additionally, she notes that she may have had urodynamics performed sometime in the past which were normal.  2. Asthma/COPD Patient reports a long-standing history of asthma for which she uses albuterol. She notes that she uses albuterol no more than 4x/ month.  She denies waking up at night with shortness of breath or cough. She denies dyspnea on exertion or shortness of breath at rest. She notes that she used to be on a controller medication however her insurance stopped paying for the one that worked for her. She takes Singulair intermittently as needed. She notes that she needs a refill on her albuterol. She is a former smoker. She quit 13 years ago.  3. Hypothyroidism Patient reports that she developed hypothyroidism in her 40s. She notes that she was actually hypothyroid for some time during her late 59s to early 30s, citing that she had lost a large amount of weight in a short amount of time despite adequate food intake.  She notes that she had a scan of her thyroid done at that time; she notes that this was read as normal. She reports that she later was placed on oral Synthroid for a hypoactive thyroid. She reports that she has  tolerated this well. She reports a strong family history of autoimmune disorder, citing that her father had a history of hypothyroidism, her brother has a history of rheumatoid arthritis, her sister has CREST syndrome and rheumatoid arthritis. She does note that about 4 months ago her dose was decreased from Synthroid 100 g to Synthroid 88 g. She notes that she has had improvement in her heart racing and palpitations since then. She reports her energy is fair. She does report some thinning of her hair and hair loss. She denies skin changes, constipation, diarrhea, lower extremity swelling.  4. HTN Patient reports compliance with Cozaar 25 mg daily. She notes that she tolerates this medication well and has been on it for some time. She needs refills on this medication. She denies shortness of breath, nausea, vomiting, visual disturbances, headache, lower extremities swelling. She does report a long standing history of intermittent chest pain which has been evaluated with cardiac catheter 2. She sees Dr. Stanford Breed for cardiology. She notes that both cardiac catheter had been completely normal. She reports she maintains a well-balanced diet. Though she does note that she is not on a statin, as she has not tolerated this in the past. She reports moderate amounts of physical activity but no structured exercise.   She reports a strong family history of cardiovascular disease, citing her father died of a massive heart attack at age 41, her sister recently had a heart attack at age 70, and her brother has 4 coronary stents in place. No known  family history of stroke.  5. Preventive healthcare Patient notes that she had her Pap smear done with Dr. Landry Mellow in February of this year. This was normal. She notes last mammogram was in 2016 which is also normal. Last colonoscopy was in 2009 which was reported as normal. She is unsure of who she had this done with but notes that it was across the street from his current  hospital in [place]. She denies rectal bleeding, melena, nausea, vomiting, breast masses, nipple discharge. She denies personal or family history of breast cancer, ovarian cancer, prostate cancer, colon cancer, or uterine cancers.   Social Hx: former smoker; quit about 13 years ago. 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 She is married and has 2 biologic children and a stepchild. Family History  Problem Relation Age of Onset  . Hypertension Father   . Hyperlipidemia Father   . Heart disease Father        Died at age 60 of MI  . Hypothyroidism Father   . Raynaud syndrome Sister   . Rheum arthritis Sister   . CAD Brother        has 4 stents  . Rheum arthritis Brother   . Heart disease Sister 43       h/o MI  . Breast cancer Neg Hx   . Ovarian cancer Neg Hx   . Prostate cancer Neg Hx   . Colon cancer Neg Hx    Past Medical History:  Diagnosis Date  . Asthma   . COPD, mild ([place])    Spirometerey 3/08  . Fatty liver disease, nonalcoholic   . GERD (gastroesophageal reflux disease)   . Hyperlipidemia   . Hypertension   . Hypothyroid Since 2003  . IBS (irritable bowel syndrome)   . Perimenopausal    Allergies  Allergen Reactions  . Influenza Vaccines   . Avelox [Moxifloxacin Hcl In Nacl] Hives  . Ciprofloxacin     REACTION: Rash  . Livalo [Pitavastatin]   . Moxifloxacin     REACTION: RASH  . Bactrim [Sulfamethoxazole-Trimethoprim] Rash  . Statins Other (See Comments)    myalgias  . Sulfamethoxazole-Trimethoprim Rash    REACTION: Rash   Medications reviewed.   ROS: Per HPI  Objective: Office vital signs reviewed. BP 132/82   Pulse 69   Temp (!) 97.2 F (36.2 C) (Oral)   Ht 5\' 4"  (1.626 m)   Wt 158 lb (71.7 kg)   BMI 27.12 kg/m   Physical Examination:  General: Awake, alert, well nourished, No acute distress HEENT: Normal    Neck: No masses palpated. No lymphadenopathy, thyroid not palpable, no palpable  thyroid masses    Eyes: extraocular movement in tact, sclera white, no exophthalmos Cardio: regular rate and rhythm, S1S2 heard, no murmurs appreciated; no JVD, no carotid bruits Pulm: clear to auscultation bilaterally, no wheezes, rhonchi or rales; normal work of breathing on room air Extremities: warm, well perfused, No edema, cyanosis or clubbing; +2 pulses bilaterally MSK: Normal gait and station Skin: dry; intact; no rashes or lesions; appropriate temperature Neuro: No focal deficits, follows commands Psych: Mood stable, pleasant, appropriate affect, speech normal  No results found for this or any previous visit (from the past 24 hour(s)).  Assessment/ Plan: 61 y.o. female   HYPERTENSION, BENIGN Well-controlled. She has a strong family history of cardiovascular disease. She is currently followed by Dr. Stanford Breed, cardiologist, in [place]. I reviewed the results of  her cardiac catheter in 2016, which was negative. We discussed healthy lifestyle choices, especially in the setting of family history of cardiovascular disease. Her Cozaar 25 mg by mouth daily was refilled today. A release of information form was filled out to obtain her lab records from Dr. Jacelyn Grip at [facility] in [place]. I encouraged her to continue to follow-up with Dr. Stanford Breed regularly for her chronic, intermittent chest pain as this will need to be followed closely given her family history.  COPD (chronic obstructive pulmonary disease) (HCC) Seems to be mild but controlled. She apparently had spirometry performed in 2008. She is not on a controller medication but is not needing albuterol more than maybe once per week. We discussed reasons for return in initiation of controller medication. Patient voiced good understanding. Albuterol refill 3 months.  Hypothyroidism Given her recent change of dose to Synthroid 88 g, a TSH was obtained today. Will contact her with the result of this lab. Her TSH goal will be towards the  higher end of normal, keeping in mind the fact that she has palpitations with the 100 g dose. If her TSH is appropriate, we will plan to refill Synthroid 88 g and have her return for recheck in 6 months. If her TSH is inappropriate, we'll plan to recheck in 4 weeks and adjust the dose accordingly. This was reviewed with patient who voiced good understanding.  Overactive bladder Long-standing problem, but new diagnosis. We discussed things that could be contributing to overactive bladder. We discussed caffeine, excessive fluid intake, and incomplete emptying as possible contributors to overactive bladder. She had a urinalysis performed today which showed no evidence of infection or other pathologies. This is likely a mixed incontinence. We reviewed the possibility of initiating medication versus referral to pelvic floor rehabilitation versus referral to urology for urodynamics. Patient would like to think about these options and she will contact me with what she would like to do. In the meantime I encouraged her to do several times daily kegel exercises. If she opts to start medication, will consider Enablex versus Vesicare versus Myrbetriq.  Stress incontinence in female See overactive bladder above. She has been offered pelvic floor rehabilitation, medication, and/or referral to urology for further evaluation. Patient will decide and contact me accordingly. Of note, she has never had any pelvic surgeries and her purse were via C-section. Low likelihood of varicocele or pelvic prolapse at this point, as patient denied any symptoms of this. However, we will consider performing pelvic exam should become clinically indicated.  Preventative health care Release of information completed by patient today. She will find out who she had her colonoscopy through and schedule an appointment for repeat colonoscopy as she is due next year. She will also schedule a mammogram. This order has been placed today. Pap  up-to-date. We need to discuss possible DEXA at her next appointment. She is unsure if she's been tested for HIV or hepatitis C. Will obtain records and if these labs have not been completed we'll plan to collect with next lab draw.  [name], DO [facility] [phone]

## 2017-06-09 ENCOUNTER — Ambulatory Visit (INDEPENDENT_AMBULATORY_CARE_PROVIDER_SITE_OTHER): Payer: PRIVATE HEALTH INSURANCE | Admitting: Family Medicine

## 2017-06-09 ENCOUNTER — Encounter: Payer: Self-pay | Admitting: Family Medicine

## 2017-06-09 VITALS — BP 132/82 | HR 69 | Temp 97.2°F | Ht 64.0 in | Wt 158.0 lb

## 2017-06-09 DIAGNOSIS — J449 Chronic obstructive pulmonary disease, unspecified: Secondary | ICD-10-CM | POA: Diagnosis not present

## 2017-06-09 DIAGNOSIS — N393 Stress incontinence (female) (male): Secondary | ICD-10-CM

## 2017-06-09 DIAGNOSIS — N3281 Overactive bladder: Secondary | ICD-10-CM | POA: Insufficient documentation

## 2017-06-09 DIAGNOSIS — E038 Other specified hypothyroidism: Secondary | ICD-10-CM

## 2017-06-09 DIAGNOSIS — I10 Essential (primary) hypertension: Secondary | ICD-10-CM

## 2017-06-09 DIAGNOSIS — Z0000 Encounter for general adult medical examination without abnormal findings: Secondary | ICD-10-CM

## 2017-06-09 DIAGNOSIS — Z7689 Persons encountering health services in other specified circumstances: Secondary | ICD-10-CM

## 2017-06-09 HISTORY — DX: Overactive bladder: N32.81

## 2017-06-09 HISTORY — DX: Stress incontinence (female) (male): N39.3

## 2017-06-09 LAB — URINALYSIS, COMPLETE
BILIRUBIN UA: NEGATIVE
GLUCOSE, UA: NEGATIVE
KETONES UA: NEGATIVE
NITRITE UA: NEGATIVE
RBC, UA: NEGATIVE
SPEC GRAV UA: 1.025 (ref 1.005–1.030)
Urobilinogen, Ur: 0.2 mg/dL (ref 0.2–1.0)
pH, UA: 5.5 (ref 5.0–7.5)

## 2017-06-09 LAB — MICROSCOPIC EXAMINATION: Renal Epithel, UA: NONE SEEN /hpf

## 2017-06-09 MED ORDER — LOSARTAN POTASSIUM 25 MG PO TABS: 25.0000 mg | ORAL_TABLET | Freq: Every day | ORAL | 3 refills | Status: DC

## 2017-06-09 MED ORDER — ALBUTEROL SULFATE HFA 108 (90 BASE) MCG/ACT IN AERS: 2.0000 | INHALATION_SPRAY | Freq: Four times a day (QID) | RESPIRATORY_TRACT | 0 refills | Status: DC | PRN

## 2017-06-09 NOTE — Assessment & Plan Note (Signed)
See overactive bladder above. She has been offered pelvic floor rehabilitation, medication, and/or referral to urology for further evaluation. Patient will decide and contact me accordingly. Of note, she has never had any pelvic surgeries and her purse were via C-section. Low likelihood of varicocele or pelvic prolapse at this point, as patient denied any symptoms of this. However, we will consider performing pelvic exam should become clinically indicated.

## 2017-06-09 NOTE — Assessment & Plan Note (Addendum)
Seems to be mild but controlled. She apparently had spirometry performed in 2008. She is not on a controller medication but is not needing albuterol more than maybe once per week. We discussed reasons for return in initiation of controller medication. Patient voiced good understanding. Albuterol refill 3 months.

## 2017-06-09 NOTE — Assessment & Plan Note (Signed)
Long-standing problem, but new diagnosis. We discussed things that could be contributing to overactive bladder. We discussed caffeine, excessive fluid intake, and incomplete emptying as possible contributors to overactive bladder. She had a urinalysis performed today which showed no evidence of infection or other pathologies. This is likely a mixed incontinence. We reviewed the possibility of initiating medication versus referral to pelvic floor rehabilitation versus referral to urology for urodynamics. Patient would like to think about these options and she will contact me with what she would like to do. In the meantime I encouraged her to do several times daily kegel exercises. If she opts to start medication, will consider Enablex versus Vesicare versus Myrbetriq.

## 2017-06-09 NOTE — Patient Instructions (Signed)
It was a pleasure seeing you today, Stacy [name].  Information regarding what we discussed is included in this packet.  Please make an appointment to see me in 3-6 months for your thyroid.    I will contact you will the results of your labs.  If anything is abnormal, I will call you.  Otherwise, expect a copy to be mailed to you.  Get your mammogram done.    Please feel free to call our office if any questions or concerns arise.  Warm Regards, Dolores Mcgovern M. Amabel Stmarie, DO   Overactive Bladder, Adult Overactive bladder is a group of urinary symptoms. With overactive bladder, you may suddenly feel the need to pass urine (urinate) right away. After feeling this sudden urge, you might also leak urine if you cannot get to the bathroom fast enough (urinary incontinence). These symptoms might interfere with your daily work or social activities. Overactive bladder symptoms may also wake you up at night. Overactive bladder affects the nerve signals between your bladder and your brain. Your bladder may get the signal to empty before it is full. Very sensitive muscles can also make your bladder squeeze too soon. What are the causes? Many things can cause an overactive bladder. Possible causes include:  Urinary tract infection.  Infection of nearby tissues, such as the prostate.  Prostate enlargement.  Being pregnant with twins or more (multiples).  Surgery on the uterus or urethra.  Bladder stones, inflammation, or tumors.  Drinking too much caffeine or alcohol.  Certain medicines, especially those that you take to help your body get rid of extra fluid (diuretics) by increasing urine production.  Muscle or nerve weakness, especially from: ? A spinal cord injury. ? Stroke. ? Multiple sclerosis. ? Parkinson disease.  Diabetes. This can cause a high urine volume that fills the bladder so quickly that the normal urge to urinate is triggered very strongly.  Constipation. A buildup of too much stool  can put pressure on your bladder.  What increases the risk? You may be at greater risk for overactive bladder if you:  Are an older adult.  Smoke.  Are going through menopause.  Have prostate problems.  Have a neurological disease, such as stroke, dementia, Parkinson disease, or multiple sclerosis (Stacy).  Eat or drink things that irritate the bladder. These include alcohol, spicy food, and caffeine.  Are overweight or obese.  What are the signs or symptoms? The signs and symptoms of an overactive bladder include:  Sudden, strong urges to urinate.  Leaking urine.  Urinating eight or more times per day.  Waking up to urinate two or more times per night.  How is this diagnosed? Your health care provider may suspect overactive bladder based on your symptoms. The health care provider will do a physical exam and take your medical history. Blood or urine tests may also be done. For example, you might need to have a bladder function test to check how well you can hold your urine. You might also need to see a health care provider who specializes in the urinary tract (urologist). How is this treated? Treatment for overactive bladder depends on the cause of your condition and whether it is mild or severe. Certain treatments can be done in your health care provider's office or clinic. You can also make lifestyle changes at home. Options include: Behavioral Treatments  Biofeedback. A specialist uses sensors to help you become aware of your body's signals.  Keeping a daily log of when you need to urinate  and what happens after the urge. This may help you manage your condition.  Bladder training. This helps you learn to control the urge to urinate by following a schedule that directs you to urinate at regular intervals (timed voiding). At first, you might have to wait a few minutes after feeling the urge. In time, you should be able to schedule bathroom visits an hour or more apart.  Kegel  exercises. These are exercises to strengthen the pelvic floor muscles, which support the bladder. Toning these muscles can help you control urination, even if your bladder muscles are overactive. A specialist will teach you how to do these exercises correctly. They require daily practice.  Weight loss. If you are obese or overweight, losing weight might relieve your symptoms of overactive bladder. Talk to your health care provider about losing weight and whether there is a specific program or method that would work best for you.  Diet change. This might help if constipation is making your overactive bladder worse. Your health care provider or a dietitian can explain ways to change what you eat to ease constipation. You might also need to consume less alcohol and caffeine or drink other fluids at different times of the day.  Stopping smoking.  Wearing pads to absorb leakage while you wait for other treatments to take effect. Physical Treatments  Electrical stimulation. Electrodes send gentle pulses of electricity to strengthen the nerves or muscles that help to control the bladder. Sometimes, the electrodes are placed outside of the body. In other cases, they might be placed inside the body (implanted). This treatment can take several months to have an effect.  Supportive devices. Women may need a plastic device that fits into the vagina and supports the bladder (pessary). Medicines Several medicines can help treat overactive bladder and are usually used along with other treatments. Some are injected into the muscles involved in urination. Others come in pill form. Your health care provider may prescribe:  Antispasmodics. These medicines block the signals that the nerves send to the bladder. This keeps the bladder from releasing urine at the wrong time.  Tricyclic antidepressants. These types of antidepressants also relax bladder muscles.  Surgery  You may have a device implanted to help manage  the nerve signals that indicate when you need to urinate.  You may have surgery to implant electrodes for electrical stimulation.  Sometimes, very severe cases of overactive bladder require surgery to change the shape of the bladder. Follow these instructions at home:  Take medicines only as directed by your health care provider.  Use any implants or a pessary as directed by your health care provider.  Make any diet or lifestyle changes that are recommended by your health care provider. These might include: ? Drinking less fluid or drinking at different times of the day. If you need to urinate often during the night, you may need to stop drinking fluids early in the evening. ? Cutting down on caffeine or alcohol. Both can make an overactive bladder worse. Caffeine is found in coffee, tea, and sodas. ? Doing Kegel exercises to strengthen muscles. ? Losing weight if you need to. ? Eating a healthy and balanced diet to prevent constipation.  Keep a journal or log to track how much and when you drink and also when you feel the need to urinate. This will help your health care provider to monitor your condition. Contact a health care provider if:  Your symptoms do not get better after treatment.  Your  pain and discomfort are getting worse.  You have more frequent urges to urinate.  You have a fever. Get help right away if: You are not able to control your bladder at all. This information is not intended to replace advice given to you by your health care provider. Make sure you discuss any questions you have with your health care provider. Document Released: 08/06/2009 Document Revised: 03/17/2016 Document Reviewed: 03/05/2014 Elsevier Interactive Patient Education  [employer].

## 2017-06-09 NOTE — Assessment & Plan Note (Signed)
Given her recent change of dose to Synthroid 88 g, a TSH was obtained today. Will contact her with the result of this lab. Her TSH goal will be towards the higher end of normal, keeping in mind the fact that she has palpitations with the 100 g dose. If her TSH is appropriate, we will plan to refill Synthroid 88 g and have her return for recheck in 6 months. If her TSH is inappropriate, we'll plan to recheck in 4 weeks and adjust the dose accordingly. This was reviewed with patient who voiced good understanding.

## 2017-06-09 NOTE — Assessment & Plan Note (Addendum)
Well-controlled. She has a strong family history of cardiovascular disease. She is currently followed by Dr. Stanford Breed, cardiologist, in [place]. I reviewed the results of her cardiac catheter in 2016, which was negative. We discussed healthy lifestyle choices, especially in the setting of family history of cardiovascular disease. Her Cozaar 25 mg by mouth daily was refilled today. A release of information form was filled out to obtain her lab records from Dr. Jacelyn Grip at [facility] in [place]. I encouraged her to continue to follow-up with Dr. Stanford Breed regularly for her chronic, intermittent chest pain as this will need to be followed closely given her family history.

## 2017-06-10 LAB — TSH: TSH: 2.56 u[IU]/mL (ref 0.450–4.500)

## 2017-06-13 ENCOUNTER — Other Ambulatory Visit: Payer: Self-pay | Admitting: Family Medicine

## 2017-06-13 ENCOUNTER — Encounter: Payer: Self-pay | Admitting: Family Medicine

## 2017-06-13 DIAGNOSIS — E038 Other specified hypothyroidism: Secondary | ICD-10-CM

## 2017-06-13 MED ORDER — LEVOTHYROXINE SODIUM 88 MCG PO TABS: 88.0000 ug | ORAL_TABLET | Freq: Every day | ORAL | 1 refills | Status: DC

## 2017-06-14 ENCOUNTER — Telehealth: Payer: Self-pay | Admitting: Family Medicine

## 2017-06-14 NOTE — Telephone Encounter (Signed)
Aware , she needs mamogram but waiting on new insurance.

## 2017-06-27 ENCOUNTER — Encounter: Payer: Self-pay | Admitting: Family Medicine

## 2017-06-27 DIAGNOSIS — E7439 Other disorders of intestinal carbohydrate absorption: Secondary | ICD-10-CM | POA: Insufficient documentation

## 2017-06-27 LAB — HEPATITIS C ANTIBODY: Hep C Virus Ab: <0.1

## 2017-06-27 LAB — HEPATIC FUNCTION PANEL
ALT: 56
AST: 36
Albumin Serum: 4.3
Calcium, Ser: 9.7
Total Bilirubin: 0.6

## 2017-06-27 LAB — BASIC METABOLIC PANEL
Anion gap, serum: 14.3
BUN, Bld: 9
CO2, serum: 28
Chloride, Serum: 103
Creatine, Serum: 0.77
Glucose, Plasma: 205
Potassium, serum: 4.6
Sodium, serum: 141

## 2017-06-27 LAB — ANA BY IFA, IGG: ANA by IFA: NEGATIVE

## 2017-06-27 NOTE — Progress Notes (Signed)
Labs from [facility] (Dr Jacelyn Grip) obtained and added into records.

## 2017-07-19 ENCOUNTER — Other Ambulatory Visit: Payer: Self-pay | Admitting: Family Medicine

## 2017-07-19 ENCOUNTER — Telehealth: Payer: Self-pay | Admitting: Family Medicine

## 2017-07-19 DIAGNOSIS — Z131 Encounter for screening for diabetes mellitus: Secondary | ICD-10-CM

## 2017-07-19 NOTE — Telephone Encounter (Signed)
DR G, not sure you can / will do this - she has only been here once to see you and she has not had a blood sugar work up at all in over 2 years.

## 2017-07-19 NOTE — Telephone Encounter (Signed)
Have her come in for an A1c.  If this is within normal limits, I will write the note stating she does not need Metformin.

## 2017-07-19 NOTE — Telephone Encounter (Signed)
Pt aware - she will come by for a1c

## 2017-08-01 ENCOUNTER — Encounter: Payer: Self-pay | Admitting: Family Medicine

## 2017-08-01 ENCOUNTER — Ambulatory Visit (INDEPENDENT_AMBULATORY_CARE_PROVIDER_SITE_OTHER): Payer: PRIVATE HEALTH INSURANCE | Admitting: Family Medicine

## 2017-08-01 VITALS — BP 143/90 | HR 67 | Temp 96.9°F | Ht 64.0 in | Wt 158.0 lb

## 2017-08-01 DIAGNOSIS — J441 Chronic obstructive pulmonary disease with (acute) exacerbation: Secondary | ICD-10-CM | POA: Diagnosis not present

## 2017-08-01 DIAGNOSIS — R002 Palpitations: Secondary | ICD-10-CM | POA: Diagnosis not present

## 2017-08-01 MED ORDER — DOXYCYCLINE HYCLATE 100 MG PO TABS: 100.0000 mg | ORAL_TABLET | Freq: Two times a day (BID) | ORAL | 0 refills | Status: AC

## 2017-08-01 MED ORDER — IPRATROPIUM-ALBUTEROL 0.5-2.5 (3) MG/3ML IN SOLN
3.0000 mL | Freq: Once | RESPIRATORY_TRACT | Status: AC
  Administered 2017-08-01: 3 mL via RESPIRATORY_TRACT

## 2017-08-01 MED ORDER — PREDNISONE 20 MG PO TABS: 40.0000 mg | ORAL_TABLET | Freq: Every day | ORAL | 0 refills | Status: DC

## 2017-08-01 NOTE — Assessment & Plan Note (Addendum)
Recent labs from outside provider reviewed metabolic panel unremarkable. Her TSH was repeated here in office a couple of months ago which was stable. Her 2016 cardiac catheter report was reviewed, which was unremarkable. Her symptoms do not appear to be related to albuterol use. There is a possible component of anxiety but it is somewhat unusual that she is awoken from sleep with heart palpitations. She currently is asymptomatic. No red flags on exam. She is wanting to see a cardiologist for further evaluation. I have placed a referral for this. Could consider Holter monitor versus event monitor.

## 2017-08-01 NOTE — Progress Notes (Signed)
Subjective: AY:TKZSW/ SOB HPI: Stacy [name] is a 61 y.o. female presenting to clinic today for:  1. Cough/ SOB Patient has a known history of asthma/COPD. Symptoms have been mild, requiring albuterol less than 3-4 times per month.  No History of hospitalizations for COPD or asthma. No History of intubations for COPD or asthma. Patient reports that cough and shortness of breath started about 3 weeks ago. Cough is productive. Denies hemoptysis. She has been using albuterol 2 puffs twice daily. She reports associated chest tightness and wheeze. Denies fevers, chills, nausea, vomiting, diarrhea, sick contacts, lower extremity swelling.  2. Heart racing Patient reports that she has had a greater than 2 year history of episodes of heart racing. She notes that she had a cardiac catheter about one half years ago with cone. She reports that this was a negative heart catheterization. She is treated for hypothyroidism. Last thyroid check was within normal limits. She takes her medication as directed. She reports associated shortness of breath with heart racing. She states that the episodes occur about 5 times per week, often in the middle the night and wakes her up from sleep. No associated nausea, vomiting, dizziness, loss of consciousness. No lower extremity swelling or pain with walking. She is active.  Social Hx: former smoker.  Family History  Problem Relation Age of Onset  . Hypertension Father   . Hyperlipidemia Father   . Heart disease Father        Died at age 33 of MI  . Hypothyroidism Father   . Raynaud syndrome Sister   . Rheum arthritis Sister   . Thyroid disease Sister   . CAD Brother        has 4 stents  . Rheum arthritis Brother   . Heart disease Sister 13       h/o MI @age  84  . Thyroid disease Sister   . Breast cancer Neg Hx   . Ovarian cancer Neg Hx   . Prostate cancer Neg Hx   . Colon cancer Neg Hx    Past Medical History:  Diagnosis Date  . Asthma   . COPD, mild  ([place])    Spirometerey 3/08  . Fatty liver disease, nonalcoholic   . GERD (gastroesophageal reflux disease)   . Hyperlipidemia   . Hypertension   . Hypothyroid Since 2003  . IBS (irritable bowel syndrome)   . Perimenopausal    Allergies  Allergen Reactions  . Influenza Vaccines   . Avelox [Moxifloxacin Hcl In Nacl] Hives  . Ciprofloxacin     REACTION: Rash  . Livalo [Pitavastatin]   . Moxifloxacin     REACTION: RASH  . Bactrim [Sulfamethoxazole-Trimethoprim] Rash  . Statins Other (See Comments)    myalgias  . Sulfamethoxazole-Trimethoprim Rash    REACTION: Rash   Medications reviewed.   Health Maintenance: flu vaccine  ROS: Per HPI  Objective: Office vital signs reviewed. BP (!) 143/90   Pulse 67   Temp (!) 96.9 F (36.1 C) (Oral)   Ht 5\' 4"  (1.626 m)   Wt 158 lb (71.7 kg)   SpO2 97%   BMI 27.12 kg/m   Physical Examination:  General: Awake, alert, well nourished, No acute distress HEENT: Normal    Neck: No masses palpated. No lymphadenopathy    Ears: Tympanic membranes intact, normal light reflex, no erythema, no bulging    Eyes: PERRLA, extraocular movement in tact, sclera white    Nose: nasal turbinates moist, no nasal discharge  Throat: moist mucus membranes, no erythema, no tonsillar exudate.  Airway is patent Cardio: regular rate and rhythm, S1S2 heard, no murmurs appreciated Pulm: globally decreased breath sounds with expiratory wheeze, no accessory muscle use, no rhonchi or rales; normal work of breathing on room air   Assessment/ Plan: 61 y.o. female   COPD with acute exacerbation ([place]) I suspect she is having a COPD exacerbation. She is not on a controller medication. We'll plan to initiate [facility] after completion of oral steroids and antibiotics for COPD exacerbation. She'll follow up in 4 weeks for this. In the interim, we'll treat with oral steroids by 5 days and oral antibiotics by 7 days. She was given a DuoNeb in office, which improved her  pulmonary exam with improved air movement and resolution of wheezes. I did instruct her to use her albuterol inhaler 2 puffs every 6 hours scheduled for the next 48 hours. Then she may use 2 puffs every 6 hours as needed for shortness of breath cough or wheeze. Strict return precautions reviewed with patient. She voiced good understanding. She will follow up in 4 weeks as scheduled. - predniSONE (DELTASONE) 20 MG tablet; Take 2 tablets (40 mg total) by mouth daily with breakfast.  Dispense: 10 tablet; Refill: 0 - doxycycline (VIBRA-TABS) 100 MG tablet; Take 1 tablet (100 mg total) by mouth 2 (two) times daily.  Dispense: 14 tablet; Refill: 0 - ipratropium-albuterol (DUONEB) 0.5-2.5 (3) MG/3ML nebulizer solution 3 mL; Take 3 mLs by nebulization once.  Heart palpitations Recent labs from outside provider reviewed metabolic panel unremarkable. Her TSH was repeated here in office a couple of months ago which was stable. Her 2016 cardiac catheter report was reviewed, which was unremarkable. Her symptoms do not appear to be related to albuterol use. There is a possible component of anxiety but it is somewhat unusual that she is awoken from sleep with heart palpitations. She currently is asymptomatic. No red flags on exam. She is wanting to see a cardiologist for further evaluation. I have placed a referral for this. Could consider Holter monitor versus event monitor.   [name], DO [place] [phone]

## 2017-08-01 NOTE — Patient Instructions (Signed)
I have placed a referral to cardiology for evaluation of your heart palpitations. If these become more severe, you're unable to breathe, you have severe chest pain, swelling in her calves or pain with walking, please seek immediate medical attention.  I prescribed you an antibiotic and an oral steroid to treat a COPD exacerbation. I recommend that you use your albuterol inhaler 2 puffs every 6 hours scheduled for the next 2 days. Continue may use it 2 puffs every 6 hours as needed for wheeze or shortness of breath. If your symptoms worsen, or do not resolve please seek immediate medical attention. Otherwise, plan to see me again in about 4 weeks for consideration of a daily preventative therapy.   Chronic Obstructive Pulmonary Disease Exacerbation Chronic obstructive pulmonary disease (COPD) is a common lung problem. In COPD, the flow of air from the lungs is limited. COPD exacerbations are times that breathing gets worse and you need extra treatment. Without treatment they can be life threatening. If they happen often, your lungs can become more damaged. If your COPD gets worse, your doctor may treat you with:  Medicines.  Oxygen.  Different ways to clear your airway, such as using a mask.  Follow these instructions at home:  Do not smoke.  Avoid tobacco smoke and other things that bother your lungs.  If given, take your antibiotic medicine as told. Finish the medicine even if you start to feel better.  Only take medicines as told by your doctor.  Drink enough fluids to keep your pee (urine) clear or pale yellow (unless your doctor has told you not to).  Use a cool mist machine (vaporizer).  If you use oxygen or a machine that turns liquid medicine into a mist (nebulizer), continue to use them as told.  Keep up with shots (vaccinations) as told by your doctor.  Exercise regularly.  Eat healthy foods.  Keep all doctor visits as told. Get help right away if:  You are very short  of breath and it gets worse.  You have trouble talking.  You have bad chest pain.  You have blood in your spit (sputum).  You have a fever.  You keep throwing up (vomiting).  You feel weak, or you pass out (faint).  You feel confused.  You keep getting worse. This information is not intended to replace advice given to you by your health care provider. Make sure you discuss any questions you have with your health care provider. Document Released: 09/29/2011 Document Revised: 03/17/2016 Document Reviewed: 06/14/2013 Elsevier Interactive Patient Education  2017 [employer].

## 2017-08-14 ENCOUNTER — Other Ambulatory Visit: Payer: Self-pay | Admitting: Family Medicine

## 2017-08-14 ENCOUNTER — Ambulatory Visit (INDEPENDENT_AMBULATORY_CARE_PROVIDER_SITE_OTHER): Payer: PRIVATE HEALTH INSURANCE | Admitting: Family Medicine

## 2017-08-14 ENCOUNTER — Ambulatory Visit (INDEPENDENT_AMBULATORY_CARE_PROVIDER_SITE_OTHER): Payer: PRIVATE HEALTH INSURANCE

## 2017-08-14 VITALS — BP 139/90 | HR 85 | Temp 97.2°F | Ht 64.0 in | Wt 157.0 lb

## 2017-08-14 DIAGNOSIS — J439 Emphysema, unspecified: Secondary | ICD-10-CM | POA: Diagnosis not present

## 2017-08-14 DIAGNOSIS — I700 Atherosclerosis of aorta: Secondary | ICD-10-CM | POA: Insufficient documentation

## 2017-08-14 DIAGNOSIS — R0601 Orthopnea: Secondary | ICD-10-CM | POA: Diagnosis not present

## 2017-08-14 DIAGNOSIS — R002 Palpitations: Secondary | ICD-10-CM

## 2017-08-14 DIAGNOSIS — R05 Cough: Secondary | ICD-10-CM

## 2017-08-14 DIAGNOSIS — R058 Other specified cough: Secondary | ICD-10-CM

## 2017-08-14 HISTORY — DX: Atherosclerosis of aorta: I70.0

## 2017-08-14 HISTORY — DX: Emphysema, unspecified: J43.9

## 2017-08-14 LAB — SPECIMEN STATUS REPORT

## 2017-08-14 MED ORDER — BENZONATATE 200 MG PO CAPS: 200.0000 mg | ORAL_CAPSULE | Freq: Two times a day (BID) | ORAL | 0 refills | Status: DC | PRN

## 2017-08-14 NOTE — Addendum Note (Signed)
Addended byCarrolyn Leigh on: 08/14/2017 02:51 PM   Modules accepted: Orders

## 2017-08-14 NOTE — Patient Instructions (Addendum)
I have ordered both a chest x-ray and a blood labs to evaluate for possible congestive heart failure.  I reviewed your chest x-ray and there does not appear to be pulmonary edema on that exam.  Your EKG did not show abnormal rhythm or evidence of ischemia (heart attack).  I want you to keep your appointment with the cardiologist for Friday.  I will contact you once the blood labs have resulted.  If there is any evidence of CHF, will start empiric treatment for this.  If there is no evidence of CHF, I will place a referral to the pulmonologist for further evaluation and management.

## 2017-08-14 NOTE — Progress Notes (Signed)
Subjective: CC: cough HPI: Stacy [name] is a 61 y.o. female presenting to clinic today for:  1. Cough Patient presents today for persistent cough and feeling overall poorly.  She reports that her cough is productive, with a frothy clear sputum.  She reports orthopnea and intermittent lower extremity edema.  She continues to have intermittent heart palpitations.  She reports intermittent dyspnea on exertion.  No fever, chills, hemoptysis, nausea, vomiting.  She notes that symptoms got better for a couple of days during treatment with doxycycline and prednisone but returned.  She has been using 2 puffs of albuterol every 6 hours with intermittent improvement in symptoms.  She is worried that she has developed congestive heart failure, setting that her father was diagnosed with CHF at 31 years old and died from complications at 42.    Social Hx: former smoker.  Family History  Problem Relation Age of Onset  . Hypertension Father   . Hyperlipidemia Father   . Heart disease Father        Died at age 72 of MI  . Hypothyroidism Father   . Raynaud syndrome Sister   . Rheum arthritis Sister   . Thyroid disease Sister   . CAD Brother        has 4 stents  . Rheum arthritis Brother   . Heart disease Sister 12       h/o MI '@age'  7  . Thyroid disease Sister   . Breast cancer Neg Hx   . Ovarian cancer Neg Hx   . Prostate cancer Neg Hx   . Colon cancer Neg Hx    Past Medical History:  Diagnosis Date  . Asthma   . COPD, mild ([place])    Spirometerey 3/08  . Fatty liver disease, nonalcoholic   . GERD (gastroesophageal reflux disease)   . Hyperlipidemia   . Hypertension   . Hypothyroid Since 2003  . IBS (irritable bowel syndrome)   . Perimenopausal    Allergies  Allergen Reactions  . Influenza Vaccines   . Avelox [Moxifloxacin Hcl In Nacl] Hives  . Ciprofloxacin     REACTION: Rash  . Livalo [Pitavastatin]   . Moxifloxacin     REACTION: RASH  . Bactrim  [Sulfamethoxazole-Trimethoprim] Rash  . Statins Other (See Comments)    myalgias  . Sulfamethoxazole-Trimethoprim Rash    REACTION: Rash    Current Outpatient Prescriptions:  .  albuterol (PROVENTIL HFA;VENTOLIN HFA) 108 (90 Base) MCG/ACT inhaler, Inhale 2 puffs into the lungs every 6 (six) hours as needed for wheezing or shortness of breath., Disp: 3 Inhaler, Rfl: 0 .  levothyroxine (SYNTHROID, LEVOTHROID) 88 MCG tablet, Take 1 tablet (88 mcg total) by mouth daily., Disp: 90 tablet, Rfl: 1 .  losartan (COZAAR) 25 MG tablet, Take 1 tablet (25 mg total) by mouth daily., Disp: 90 tablet, Rfl: 3 .  montelukast (SINGULAIR) 10 MG tablet, Take 10 mg by mouth at bedtime., Disp: , Rfl:  .  Probiotic Product (PROBIOTIC PO), Take 1 tablet by mouth daily as needed (digestion)., Disp: , Rfl:   Health Maintenance: flu shot  ROS: Per HPI  Objective: Office vital signs reviewed. BP 139/90   Pulse 85   Temp (!) 97.2 F (36.2 C) (Oral)   Ht '5\' 4"'  (1.626 m)   Wt 157 lb (71.2 kg)   SpO2 97%   BMI 26.95 kg/m   Physical Examination:  General: Awake, alert, well nourished, anxious appearing, No acute distress Neck: No JVD, thyroid not palpable.  Cardio: regular rate and rhythm, S1S2 heard, no murmurs appreciated Pulm: clear to auscultation bilaterally, no wheezes, rhonchi or rales; normal work of breathing on room air Ext: WWP, no edema, cyanosis or clubbing.  Dg Chest 2 View  Result Date: 08/14/2017 CLINICAL DATA:  Cough EXAM: CHEST  2 VIEW COMPARISON:  September 10, 2016 FINDINGS: There is evidence of bullous disease in the upper lobes. There is no edema or consolidation. Heart size is normal. Pulmonary vascularity reflects underlying bullous disease in the upper lobes with diminished upper lobe vascularity. No adenopathy. There is aortic atherosclerosis. There is degenerative change in the thoracic spine. IMPRESSION: Upper lobe bullous disease. No edema or consolidation. Aortic atherosclerosis. No  evident adenopathy. Aortic Atherosclerosis (ICD10-I70.0) and Emphysema (ICD10-J43.9). Electronically Signed   By: Lowella Grip III M.D.   On: 08/14/2017 14:13    Assessment/ Plan: 61 y.o. female   1. Cough present for greater than 3 weeks Patient with a history of COPD.  She is status post treatment for COPD exacerbation.  Vital signs today in office are remarkable for borderline elevated blood pressures.  Pulse normal, oxygen saturation on room air 97% with normal respiratory rate.  Cardiopulmonary exam unremarkable.  She is reporting some signs concerning for possible cardiac etiology.  Chest x-ray obtained and was negative for cardiac enlargement, evidence of CHF or pneumonia.  Bullous changes in the upper lungs were appreciated.  I suspect this is secondary to COPD.  Will plan to refer to pulmonology for further evaluation.   - DG Chest 2 View; Future - Respiratory virus panel - CBC w/ diff  2. Orthopnea Chest x-ray, BNP and BMP have been ordered.  She has a referral in place to cardiology for history of heart palpitations.  2016 echocardiogram was reviewed which demonstrated normal ejection fraction 60-65%, no regional wall motion abnormal no significant valvular dysfunction.  She had a negative cardiac cath at that time as well.  No evidence of fluid overload on today's exam. - Brain natriuretic peptide - BMP8+EGFR  3. Heart palpitations TSH normal in 05/2017.  Obtain BMP, CBC w/ diff.  Cardiac exam unremarkable.  She is sinus rhythm with an appropriate rate.  EKG demonstrated no evidence of ischemia or arrhythmia. - EKG 12-Lead  Orders Placed This Encounter  Procedures  . Respiratory virus panel  . DG Chest 2 View    Standing Status:   Future    Number of Occurrences:   1    Standing Expiration Date:   10/14/2018    Order Specific Question:   Reason for Exam (SYMPTOM  OR DIAGNOSIS REQUIRED)    Answer:   persistent cough    Order Specific Question:   Is patient pregnant?     Answer:   No    Order Specific Question:   Preferred imaging location?    Answer:   Internal    Order Specific Question:   Radiology Contrast Protocol - do NOT remove file path    Answer:   [email or web address]  . Brain natriuretic peptide  . BMP8+EGFR  . Ambulatory referral to Pulmonology    Referral Priority:   Urgent    Referral Type:   Consultation    Referral Reason:   Specialty Services Required    Requested Specialty:   Pulmonary Disease    Number of Visits Requested:   1  . EKG 12-Lead    [name], [facility] [phone]

## 2017-08-14 NOTE — Addendum Note (Signed)
Addended by: [name] on: 08/14/2017 03:39 PM   Modules accepted: Orders

## 2017-08-15 LAB — BMP8+EGFR
BUN / CREAT RATIO: 14 (ref 9–23)
BUN: 10 mg/dL (ref 6–24)
CALCIUM: 9.8 mg/dL (ref 8.7–10.2)
CHLORIDE: 103 mmol/L (ref 96–106)
CO2: 23 mmol/L (ref 20–29)
Creatinine, Ser: 0.7 mg/dL (ref 0.57–1.00)
GFR calc non Af Amer: 95 mL/min/{1.73_m2} (ref >59)
GFR, EST AFRICAN AMERICAN: 110 mL/min/{1.73_m2} (ref >59)
Glucose: 99 mg/dL (ref 65–99)
POTASSIUM: 4.3 mmol/L (ref 3.5–5.2)
Sodium: 143 mmol/L (ref 134–144)

## 2017-08-15 LAB — BRAIN NATRIURETIC PEPTIDE: BNP: 5.3 pg/mL (ref 0.0–100.0)

## 2017-08-16 LAB — RESPIRATORY VIRUS PANEL
ADENOVIRUS: NEGATIVE
INFLUENZA A: NEGATIVE
INFLUENZA B 1: NEGATIVE
METAPNEUMOVIRUS: NEGATIVE
PARAINFLUENZA 1 A: NEGATIVE
Parainfluenza 2: NEGATIVE
Parainfluenza 3: NEGATIVE
RESPIRATORY SYNCYTIAL VIRUS B: NEGATIVE
RHINOVIRUS: NEGATIVE
Respiratory Syncytial Virus A: NEGATIVE

## 2017-08-18 ENCOUNTER — Encounter: Payer: Self-pay | Admitting: Cardiology

## 2017-08-18 ENCOUNTER — Ambulatory Visit (INDEPENDENT_AMBULATORY_CARE_PROVIDER_SITE_OTHER): Payer: 59 | Admitting: Cardiology

## 2017-08-18 VITALS — BP 138/78 | HR 85 | Ht 64.0 in | Wt 158.0 lb

## 2017-08-18 DIAGNOSIS — R0602 Shortness of breath: Secondary | ICD-10-CM | POA: Diagnosis not present

## 2017-08-18 DIAGNOSIS — R0789 Other chest pain: Secondary | ICD-10-CM

## 2017-08-18 DIAGNOSIS — R002 Palpitations: Secondary | ICD-10-CM

## 2017-08-18 LAB — CBC WITH DIFFERENTIAL/PLATELET
Basophils Absolute: 0.1 10*3/uL (ref 0.0–0.2)
Basos: 1 %
EOS (ABSOLUTE): 0.7 10*3/uL — ABNORMAL HIGH (ref 0.0–0.4)
Eos: 7 %
HEMATOCRIT: 45.3 % (ref 34.0–46.6)
Hemoglobin: 14.8 g/dL (ref 11.1–15.9)
IMMATURE GRANULOCYTES: 0 %
Immature Grans (Abs): 0 10*3/uL (ref 0.0–0.1)
LYMPHS ABS: 3.1 10*3/uL (ref 0.7–3.1)
LYMPHS: 33 %
MCH: 31 pg (ref 26.6–33.0)
MCHC: 32.7 g/dL (ref 31.5–35.7)
MCV: 95 fL (ref 79–97)
MONOS ABS: 1 10*3/uL — AB (ref 0.1–0.9)
Monocytes: 10 %
NEUTROS ABS: 4.6 10*3/uL (ref 1.4–7.0)
Neutrophils: 49 %
Platelets: 309 10*3/uL (ref 150–379)
RBC: 4.78 x10E6/uL (ref 3.77–5.28)
RDW: 13 % (ref 12.3–15.4)
WBC: 9.4 10*3/uL (ref 3.4–10.8)

## 2017-08-18 NOTE — Patient Instructions (Signed)
Medication Instructions:  Continue all current medications.  Labwork: none  Testing/Procedures:  Your physician has recommended that you wear a 21 day event monitor. Event monitors are medical devices that record the heart's electrical activity. Doctors most often [place] these monitors to diagnose arrhythmias. Arrhythmias are problems with the speed or rhythm of the heartbeat. The monitor is a small, portable device. You can wear one while you do your normal daily activities. This is usually used to diagnose what is causing palpitations/syncope (passing out).  Office will contact with results via phone or letter.    Follow-Up: 6 weeks   Any Other Special Instructions Will Be Listed Below (If Applicable).  If you need a refill on your cardiac medications before your next appointment, please call your pharmacy.

## 2017-08-18 NOTE — Progress Notes (Signed)
Clinical Summary Ms. Trzcinski is a 61 y.o.female last seen Dr Stanford Breed in 05/2015, this is our first visit together. She is seen for the following medical problems.    1. COPD/SOB - ongoing cough, SOB. - SOB x 1.5 years. Increased cough x 2 months. Productive cough, white and foamy - DOE with walking to mailbox.  - Recent LE edema, abdpominal distension.  - can have some chest pressure at times, at rest or with activity.  - history of smoking, Appointment pending with pulmononloigst - recent normal BNP, prior echo normal LVEF with mild diasotlic dysfunction. Recent normal cath.   2. History of Chest pain - prior cath 2010 - GXT 2016 without ischemia - echo 05/2015 LVEF normal, grade I diastolc dysfunction - still with atypical symptoms at times.   3. Palpitations - often occurs at night. Feels like butterfly in chest - 1-2 times per month. Lasts up 2 hours. - 2 cups of coffee, 1 soda a day, no energy drinks, seldom alcohol  Past Medical History:  Diagnosis Date  . Asthma   . COPD, mild ([place])    Spirometerey 3/08  . Fatty liver disease, nonalcoholic   . GERD (gastroesophageal reflux disease)   . Hyperlipidemia   . Hypertension   . Hypothyroid Since 2003  . IBS (irritable bowel syndrome)   . Perimenopausal      Allergies  Allergen Reactions  . Influenza Vaccines   . Avelox [Moxifloxacin Hcl In Nacl] Hives  . Ciprofloxacin     REACTION: Rash  . Livalo [Pitavastatin]   . Moxifloxacin     REACTION: RASH  . Bactrim [Sulfamethoxazole-Trimethoprim] Rash  . Statins Other (See Comments)    myalgias  . Sulfamethoxazole-Trimethoprim Rash    REACTION: Rash     Current Outpatient Prescriptions  Medication Sig Dispense Refill  . albuterol (PROVENTIL HFA;VENTOLIN HFA) 108 (90 Base) MCG/ACT inhaler Inhale 2 puffs into the lungs every 6 (six) hours as needed for wheezing or shortness of breath. 3 Inhaler 0  . benzonatate (TESSALON) 200 MG capsule Take 1 capsule (200 mg  total) by mouth 2 (two) times daily as needed for cough. 20 capsule 0  . levothyroxine (SYNTHROID, LEVOTHROID) 88 MCG tablet Take 1 tablet (88 mcg total) by mouth daily. 90 tablet 1  . losartan (COZAAR) 25 MG tablet Take 1 tablet (25 mg total) by mouth daily. 90 tablet 3  . montelukast (SINGULAIR) 10 MG tablet Take 10 mg by mouth at bedtime.    . Probiotic Product (PROBIOTIC PO) Take 1 tablet by mouth daily as needed (digestion).     No current facility-administered medications for this visit.      Past Surgical History:  Procedure Laterality Date  . ABLATION    . CARDIAC CATHETERIZATION N/A 06/12/2015   Procedure: Left Heart Cath and Coronary Angiography;  Surgeon: Burnell Blanks, MD;  Location: [facility];  Service: Cardiovascular;  Laterality: N/A;  . CESAREAN SECTION    . LTCS     x2  . SPIROMETRY  01/05/2007   FVC 68% predicted; FEV1 44% predicted; FEV1/FVC 63% predicted = moderate obstruction with low vital capacity possibly due to restriction  . TUBAL LIGATION       Allergies  Allergen Reactions  . Influenza Vaccines   . Avelox [Moxifloxacin Hcl In Nacl] Hives  . Ciprofloxacin     REACTION: Rash  . Livalo [Pitavastatin]   . Moxifloxacin     REACTION: RASH  . Bactrim [Sulfamethoxazole-Trimethoprim] Rash  .  Statins Other (See Comments)    myalgias  . Sulfamethoxazole-Trimethoprim Rash    REACTION: Rash      Family History  Problem Relation Age of Onset  . Hypertension Father   . Hyperlipidemia Father   . Heart disease Father        Died at age 16 of MI  . Hypothyroidism Father   . Raynaud syndrome Sister   . Rheum arthritis Sister   . Thyroid disease Sister   . CAD Brother        has 4 stents  . Rheum arthritis Brother   . Heart disease Sister 89       h/o MI @age  74  . Thyroid disease Sister   . Breast cancer Neg Hx   . Ovarian cancer Neg Hx   . Prostate cancer Neg Hx   . Colon cancer Neg Hx      Social History Ms. Kesinger reports  that she quit smoking about 9 years ago. Her smoking use included Cigarettes. She has a 15.00 pack-year smoking history. She has never used smokeless tobacco. Ms. Bureau reports that she drinks alcohol.   Review of Systems CONSTITUTIONAL: No weight loss, fever, chills, weakness or fatigue.  HEENT: Eyes: No visual loss, blurred vision, double vision or yellow sclerae.No hearing loss, sneezing, congestion, runny nose or sore throat.  SKIN: No rash or itching.  CARDIOVASCULAR: per hpi RESPIRATORY: per hpi GASTROINTESTINAL: No anorexia, nausea, vomiting or diarrhea. No abdominal pain or blood.  GENITOURINARY: No burning on urination, no polyuria NEUROLOGICAL: No headache, dizziness, syncope, paralysis, ataxia, numbness or tingling in the extremities. No change in bowel or bladder control.  MUSCULOSKELETAL: No muscle, back pain, joint pain or stiffness.  LYMPHATICS: No enlarged nodes. No history of splenectomy.  PSYCHIATRIC: No history of depression or anxiety.  ENDOCRINOLOGIC: No reports of sweating, cold or heat intolerance. No polyuria or polydipsia.  Marland Kitchen   Physical Examination Vitals:   08/18/17 0911  BP: 138/78  Pulse: 85  SpO2: 96%   Vitals:   08/18/17 0911  Weight: 158 lb (71.7 kg)  Height: 5\' 4"  (1.626 m)    Gen: resting comfortably, no acute distress HEENT: no scleral icterus, pupils equal round and reactive, no palptable cervical adenopathy,  CV: RRR, no m/r/g, no jvd Resp: Clear to auscultation bilaterally GI: abdomen is soft, non-tender, non-distended, normal bowel sounds, no hepatosplenomegaly MSK: extremities are warm, no edema.  Skin: warm, no rash Neuro:  no focal deficits Psych: appropriate affect   Diagnostic Studies 05/2015 cath 1. No angiographic evidence of CAD 2. Normal LV systolic function   04/4258 echo Study Conclusions  - Left ventricle: The cavity size was normal. There was mild   concentric hypertrophy. Systolic function was normal. The    estimated ejection fraction was in the range of 60% to 65%. Wall   motion was normal; there were no regional wall motion   abnormalities. Doppler parameters are consistent with abnormal   left ventricular relaxation (grade 1 diastolic dysfunction).  05/2015 GXT  There was no ST segment deviation noted during stress.   Assessment and Plan  1. SOB - symptoms appear to be pulmonary related based on description and prior extensive cardiac workup including echo and cath, recent BNP was 5 - f/u with pulmonary  2. Chest pain - atypical, negative cath 2 years ago - continue to monitor  3. Palpitations - baseline EKG shows SR - obtain 21 day event monitor to further evaluate  Arnoldo Lenis, M.D., F.A.C.C.

## 2017-08-22 NOTE — Progress Notes (Deleted)
Subjective: CC: follow up cough >3 weeks HPI: Stacy [name] is a 61 y.o. female presenting to clinic today for:  1. Cough Patient was seen 2 weeks ago for cough >3 weeks.  There was concern for possible new onset CHF, CXR and labs did not support diagnosis.  However, bullous changes in the upper lobes were seen on CXR.  She was s/p treatment for COPD exacerbation with no improvement.  Patient was referred to pulmonology for further assessment. ***  Social Hx: former smoker.  Family History  Problem Relation Age of Onset  . Hypertension Father   . Hyperlipidemia Father   . Heart disease Father        Died at age 29 of MI  . Hypothyroidism Father   . Raynaud syndrome Sister   . Rheum arthritis Sister   . Thyroid disease Sister   . CAD Brother        has 4 stents  . Rheum arthritis Brother   . Heart disease Sister 53       h/o MI @age  59  . Thyroid disease Sister   . Breast cancer Neg Hx   . Ovarian cancer Neg Hx   . Prostate cancer Neg Hx   . Colon cancer Neg Hx    Past Medical History:  Diagnosis Date  . Asthma   . COPD, mild ([place])    Spirometerey 3/08  . Fatty liver disease, nonalcoholic   . GERD (gastroesophageal reflux disease)   . Hyperlipidemia   . Hypertension   . Hypothyroid Since 2003  . IBS (irritable bowel syndrome)   . Perimenopausal    Allergies  Allergen Reactions  . Influenza Vaccines   . Avelox [Moxifloxacin Hcl In Nacl] Hives  . Ciprofloxacin     REACTION: Rash  . Livalo [Pitavastatin]   . Moxifloxacin     REACTION: RASH  . Bactrim [Sulfamethoxazole-Trimethoprim] Rash  . Statins Other (See Comments)    myalgias  . Sulfamethoxazole-Trimethoprim Rash    REACTION: Rash    Current Outpatient Prescriptions:  .  albuterol (PROVENTIL HFA;VENTOLIN HFA) 108 (90 Base) MCG/ACT inhaler, Inhale 2 puffs into the lungs every 6 (six) hours as needed for wheezing or shortness of breath., Disp: 3 Inhaler, Rfl: 0 .  benzonatate (TESSALON) 200 MG capsule,  Take 1 capsule (200 mg total) by mouth 2 (two) times daily as needed for cough., Disp: 20 capsule, Rfl: 0 .  levothyroxine (SYNTHROID, LEVOTHROID) 88 MCG tablet, Take 1 tablet (88 mcg total) by mouth daily., Disp: 90 tablet, Rfl: 1 .  losartan (COZAAR) 25 MG tablet, Take 1 tablet (25 mg total) by mouth daily., Disp: 90 tablet, Rfl: 3 .  montelukast (SINGULAIR) 10 MG tablet, Take 10 mg by mouth at bedtime., Disp: , Rfl:  .  Omeprazole-Sodium Bicarbonate (ZEGERID OTC PO), Take by mouth., Disp: , Rfl:  .  Probiotic Product (PROBIOTIC PO), Take 1 tablet by mouth daily as needed (digestion)., Disp: , Rfl:   Health Maintenance: flu shot  ROS: Per HPI  Objective: Office vital signs reviewed. There were no vitals taken for this visit.  Physical Examination:  General: Awake, alert, well nourished, anxious appearing, No acute distress Neck: No JVD, thyroid not palpable. Cardio: regular rate and rhythm, S1S2 heard, no murmurs appreciated Pulm: clear to auscultation bilaterally, no wheezes, rhonchi or rales; normal work of breathing on room air Ext: WWP, no edema, cyanosis or clubbing.  No results found.  Assessment/ Plan: 61 y.o. female   ***  No orders of the defined types were placed in this encounter.   [name], DO [place] [phone]

## 2017-08-25 ENCOUNTER — Ambulatory Visit (INDEPENDENT_AMBULATORY_CARE_PROVIDER_SITE_OTHER): Payer: 59 | Admitting: Emergency Medicine

## 2017-08-25 ENCOUNTER — Encounter: Payer: Self-pay | Admitting: Emergency Medicine

## 2017-08-25 ENCOUNTER — Other Ambulatory Visit: Payer: 59

## 2017-08-25 VITALS — BP 120/84 | HR 76 | Ht 64.0 in | Wt 155.0 lb

## 2017-08-25 DIAGNOSIS — J449 Chronic obstructive pulmonary disease, unspecified: Secondary | ICD-10-CM

## 2017-08-25 DIAGNOSIS — Z23 Encounter for immunization: Secondary | ICD-10-CM

## 2017-08-25 DIAGNOSIS — R0600 Dyspnea, unspecified: Secondary | ICD-10-CM | POA: Diagnosis not present

## 2017-08-25 NOTE — Addendum Note (Signed)
Addended by: Rocky Morel D on: 08/25/2017 11:42 AM   Modules accepted: Orders

## 2017-08-25 NOTE — Progress Notes (Signed)
Subjective:    Patient ID: Stacy [name], female    DOB: [birth year], 61 y.o.   MRN: [id number]  HPI 61 year old former smoker (40+ pack years), history of hypertension, hyperlipidemia, hypothyroidism, irritable bowel syndrome.  There is a history of COPD vs asthma that was made about 2007 clinically and by spirometry 2008.  She is referred today for evaluation of chronic cough of 2 months duration, exertional dyspnea. 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 In retrospect she may have had some dyspnea even before that. She has increased wheeze. The cough is prod of thick white or frothy.  She hears wheeze at night. The warehouse exposure is now over.    Review of Systems  HENT: Positive for congestion, postnasal drip and sinus pressure.   Respiratory: Positive for cough, chest tightness, shortness of breath and wheezing.   Cardiovascular: Positive for palpitations and leg swelling.  Allergic/Immunologic: Negative.   Neurological: Positive for headaches.  Psychiatric/Behavioral: The patient is nervous/anxious.     Past Medical History:  Diagnosis Date  . Asthma   . COPD, mild ([place])    Spirometerey 3/08  . Fatty liver disease, nonalcoholic   . GERD (gastroesophageal reflux disease)   . Hyperlipidemia   . Hypertension   . Hypothyroid Since 2003  . IBS (irritable bowel syndrome)   . Perimenopausal      Family History  Problem Relation Age of Onset  . Hypertension Father   . Hyperlipidemia Father   . Heart disease Father        Died at age 55 of MI  . Hypothyroidism Father   . Raynaud syndrome Sister   . Rheum arthritis Sister   . Thyroid disease Sister   . CAD Brother        has 4 stents  . Rheum arthritis Brother   . Heart disease Sister 60       h/o MI @age  76  . Thyroid disease Sister   . Breast cancer Neg Hx   . Ovarian cancer  Neg Hx   . Prostate cancer Neg Hx   . 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  No other occupational exposure.  Has lived in [place] and [place] No military   Allergies  Allergen Reactions  . Avelox [Moxifloxacin Hcl In Nacl] Hives  . Ciprofloxacin     REACTION: Rash  . Livalo [Pitavastatin]   . Moxifloxacin     REACTION: RASH  . Bactrim [Sulfamethoxazole-Trimethoprim] Rash  . Statins Other (See Comments)    myalgias  . Sulfamethoxazole-Trimethoprim Rash    REACTION: Rash     Outpatient Medications Prior to Visit  Medication Sig Dispense Refill  . albuterol (PROVENTIL HFA;VENTOLIN HFA) 108 (90 Base) MCG/ACT inhaler Inhale 2 puffs into the lungs every 6 (six) hours as needed for wheezing or shortness of breath. 3 Inhaler 0  . benzonatate (TESSALON) 200 MG capsule Take 1 capsule (200 mg total) by mouth 2 (two) times daily as needed for cough. 20 capsule 0  . levothyroxine (SYNTHROID, LEVOTHROID) 88 MCG tablet Take 1 tablet (88 mcg total) by mouth daily. 90 tablet 1  . losartan (COZAAR) 25 MG tablet Take 1 tablet (25 mg total) by mouth daily. 90 tablet 3  . montelukast (SINGULAIR) 10 MG tablet Take 10 mg by mouth at  bedtime.    Earney [employer] Bicarbonate (ZEGERID OTC PO) Take by mouth.    . Probiotic Product (PROBIOTIC PO) Take 1 tablet by mouth daily as needed (digestion).     No facility-administered medications prior to visit.         Objective:   Physical Exam Vitals:   08/25/17 1047  BP: 120/84  Pulse: 76  SpO2: 100%  Weight: 155 lb (70.3 kg)  Height: 5\' 4"  (1.626 m)   Gen: Pleasant, well-nourished, in no distress,  normal affect  ENT: No lesions,  mouth clear,  oropharynx clear, no postnasal drip  Neck: No JVD, no TMG, no carotid bruits  Lungs: No use of accessory muscles, no dullness to percussion, clear without rales or rhonchi  Cardiovascular: RRR, heart sounds normal, no murmur or gallops, no peripheral  edema  Musculoskeletal: No deformities, no cyanosis or clubbing  Neuro: alert, non focal  Skin: Warm, no lesions or rash   CXR 08/14/17 -  COMPARISON:  September 10, 2016  FINDINGS: There is evidence of bullous disease in the upper lobes. There is no edema or consolidation. Heart size is normal. Pulmonary vascularity reflects underlying bullous disease in the upper lobes with diminished upper lobe vascularity. No adenopathy. There is aortic atherosclerosis. There is degenerative change in the thoracic spine.  IMPRESSION: Upper lobe bullous disease. No edema or consolidation. Aortic atherosclerosis. No evident adenopathy.      Assessment & Plan:  COPD (chronic obstructive pulmonary disease) ([place]) Presumed COPD based on her clinical history, recent worsening of symptoms given a new exposure to dust/mold.  She is ending that exposure.  I believe we need to quantify her degree of obstruction.  She may ultimately need a CT scan of the chest to define apical bullous disease suspected on recent chest x-ray.  I will defer scheduled bronchodilators until her PFT are done.  I suspect she will require.  Check an alpha-1 antitrypsin for completeness  We will perform full pulmonary function testing  Keep albuterol available to use 2 puffs if needed for shortness of breath or wheezing.  We will check blood work today.  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  We may consider a CT scan of your chest at some point in the future to evaluate for bullous disease.  Flu shot today Follow with Dr Lamonte Sakai next available with full PFT  Baltazar Apo, MD, PhD 08/25/2017, 11:31 AM [facility] [phone] or if no answer [phone]

## 2017-08-25 NOTE — Patient Instructions (Addendum)
We will perform full pulmonary function testing  Keep albuterol available to use 2 puffs if needed for shortness of breath or wheezing.  We will check blood work today.  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  We may consider a CT scan of your chest at some point in the future to evaluate for bullous disease.  Flu shot today Follow with Dr Lamonte Sakai next available with full PFT

## 2017-08-25 NOTE — Assessment & Plan Note (Signed)
Presumed COPD based on her clinical history, recent worsening of symptoms given a new exposure to dust/mold.  She is ending that exposure.  I believe we need to quantify her degree of obstruction.  She may ultimately need a CT scan of the chest to define apical bullous disease suspected on recent chest x-ray.  I will defer scheduled bronchodilators until her PFT are done.  I suspect she will require.  Check an alpha-1 antitrypsin for completeness  We will perform full pulmonary function testing  Keep albuterol available to use 2 puffs if needed for shortness of breath or wheezing.  We will check blood work today.  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  We may consider a CT scan of your chest at some point in the future to evaluate for bullous disease.  Flu shot today Follow with Dr Lamonte Sakai next available with full PFT

## 2017-08-29 ENCOUNTER — Ambulatory Visit: Payer: PRIVATE HEALTH INSURANCE | Admitting: Family Medicine

## 2017-08-30 LAB — ALPHA-1 ANTITRYPSIN PHENOTYPE: A1 ANTITRYPSIN SER: 116 mg/dL (ref 83–199)

## 2017-09-15 ENCOUNTER — Other Ambulatory Visit: Payer: Self-pay | Admitting: Family Medicine

## 2017-09-28 ENCOUNTER — Encounter: Payer: Self-pay | Admitting: Emergency Medicine

## 2017-09-28 ENCOUNTER — Ambulatory Visit (INDEPENDENT_AMBULATORY_CARE_PROVIDER_SITE_OTHER): Payer: 59 | Admitting: Emergency Medicine

## 2017-09-28 DIAGNOSIS — J449 Chronic obstructive pulmonary disease, unspecified: Secondary | ICD-10-CM | POA: Diagnosis not present

## 2017-09-28 DIAGNOSIS — R0600 Dyspnea, unspecified: Secondary | ICD-10-CM

## 2017-09-28 DIAGNOSIS — J301 Allergic rhinitis due to pollen: Secondary | ICD-10-CM | POA: Diagnosis not present

## 2017-09-28 LAB — PULMONARY FUNCTION TEST
DL/VA % pred: 85 %
DL/VA: 4.01 ml/min/mmHg/L
DLCO cor % pred: 67 %
DLCO cor: 15.5 ml/min/mmHg
DLCO unc % pred: 68 %
DLCO unc: 15.64 ml/min/mmHg
FEF 25-75 Post: 0.66 L/sec
FEF 25-75 Pre: 0.42 L/sec
FEF2575-%Change-Post: 58 %
FEF2575-%Pred-Post: 28 %
FEF2575-%Pred-Pre: 18 %
FEV1-%Change-Post: 11 %
FEV1-%Pred-Post: 47 %
FEV1-%Pred-Pre: 43 %
FEV1-Post: 1.18 L
FEV1-Pre: 1.06 L
FEV1FVC-%Change-Post: -5 %
FEV1FVC-%Pred-Pre: 68 %
FEV6-%Change-Post: 17 %
FEV6-%Pred-Post: 73 %
FEV6-%Pred-Pre: 62 %
FEV6-Post: 2.26 L
FEV6-Pre: 1.92 L
FEV6FVC-%Change-Post: 0 %
FEV6FVC-%Pred-Post: 99 %
FEV6FVC-%Pred-Pre: 99 %
FVC-%Change-Post: 17 %
FVC-%Pred-Post: 72 %
FVC-%Pred-Pre: 61 %
FVC-Post: 2.34 L
FVC-Pre: 1.98 L
Post FEV1/FVC ratio: 51 %
Post FEV6/FVC ratio: 97 %
Pre FEV1/FVC ratio: 54 %
Pre FEV6/FVC Ratio: 97 %
RV % pred: 176 %
RV: 3.4 L
TLC % pred: 120 %
TLC: 5.9 L

## 2017-09-28 NOTE — Assessment & Plan Note (Addendum)
Her pulmonary function testing is consistent with significant hyperinflation, COPD with asthmatic features.  Based on this I think we will start treatment with breo, assess her response and then try to order this through her pharmacy if she tolerates.  Start albuterol to use as needed.  Her chest x-ray suggested severe hyperinflation versus bullous disease.  I like to see how she responds to medical therapy before committing her to CT scan to define possible bullous disease.

## 2017-09-28 NOTE — Patient Instructions (Addendum)
Please stop Singulair for now.  We will decide later whether to restart. Please start loratadine 10 mg once a day We will start a new medication called Breo 1 inhalation once a day.  Remember to rinse and gargle your mouth after using this medication. Please use albuterol 2 puffs if needed for shortness of breath, wheezing, chest tightness We may decide to perform a CT scan of your chest at some point in the future.  We do not need to plan this at this time. Flu shot up to date.  Follow with Dr Lamonte Sakai in 3 months or sooner if you have any problems.

## 2017-09-28 NOTE — Progress Notes (Signed)
PFT completed today 09/28/17.  

## 2017-09-28 NOTE — Assessment & Plan Note (Signed)
She notes that Singulair sometimes causes insomnia and she does not take it reliably.  We will stop this and change to loratadine to see if this helps with her allergic rhinitis and chronic mucus clearance

## 2017-09-28 NOTE — Progress Notes (Signed)
Subjective:    Patient ID: Stacy [name], female    DOB: [birth year], 61 y.o.   MRN: [id number]  Shortness of Breath  Associated symptoms include headaches, leg swelling and wheezing.   61 year old former smoker (40+ pack years), history of hypertension, hyperlipidemia, hypothyroidism, irritable bowel syndrome.  There is a history of COPD vs asthma that was made about 2007 clinically and by spirometry 2008.  She is referred today for evaluation of chronic cough of 2 months duration, exertional dyspnea. 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 In retrospect she may have had some dyspnea even before that. She has increased wheeze. The cough is prod of thick white or frothy.  She hears wheeze at night. The warehouse exposure is now over.   ROV 09/28/17 --this is a follow-up visit for patient with a history of tobacco use, significant occupational dust exposure, and some evidence for hyperinflation versus bullous disease by imaging.  She returns today for further evaluation and alpha-1 antitrypsin genotype was MM, level 116 mg/dL. Her cough is better than last visit. Still coughs some.  She doesn't take singulair reliably.  She has exertional dyspnea and dyspnea especially with spells of coughing.  Has some clear mucus that she needs to cough up every day.  She underwent pulmonary function testing today that I have personally reviewed.  This shows very severe obstruction with a positive bronchodilator response, severe hyperinflation of her lung volumes, decreased diffusion capacity with correction to the normal range when adjusted for alveolar volume.  She reports that she has been on Spiriva before had to stop this due to myalgias.  She is also been on Asmanex in the past but this was too costly to continue.   Review of Systems  HENT: Positive for congestion, postnasal  drip and sinus pressure.   Respiratory: Positive for cough, chest tightness, shortness of breath and wheezing.   Cardiovascular: Positive for palpitations and leg swelling.  Allergic/Immunologic: Negative.   Neurological: Positive for headaches.  Psychiatric/Behavioral: The patient is nervous/anxious.     Past Medical History:  Diagnosis Date  . Asthma   . COPD, mild ([place])    Spirometerey 3/08  . Fatty liver disease, nonalcoholic   . GERD (gastroesophageal reflux disease)   . Hyperlipidemia   . Hypertension   . Hypothyroid Since 2003  . IBS (irritable bowel syndrome)   . Perimenopausal      Family History  Problem Relation Age of Onset  . Hypertension Father   . Hyperlipidemia Father   . Heart disease Father        Died at age 5 of MI  . Hypothyroidism Father   . Raynaud syndrome Sister   . Rheum arthritis Sister   . Thyroid disease Sister   . CAD Brother        has 4 stents  . Rheum arthritis Brother   . Heart disease Sister 59       h/o MI @age  86  . Thyroid disease Sister   . Breast cancer Neg Hx   . Ovarian cancer Neg Hx   . Prostate cancer Neg Hx   . 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  No other occupational exposure.  Has lived in [place] and [place] No military   Allergies  Allergen Reactions  .  Avelox [Moxifloxacin Hcl In Nacl] Hives  . Ciprofloxacin     REACTION: Rash  . Livalo [Pitavastatin]   . Moxifloxacin     REACTION: RASH  . Bactrim [Sulfamethoxazole-Trimethoprim] Rash  . Statins Other (See Comments)    myalgias  . Sulfamethoxazole-Trimethoprim Rash    REACTION: Rash     Outpatient Medications Prior to Visit  Medication Sig Dispense Refill  . albuterol (PROVENTIL HFA;VENTOLIN HFA) 108 (90 Base) MCG/ACT inhaler TAKE 2 PUFFS BY MOUTH EVERY 6 HOURS AS NEEDED FOR WHEEZE OR SHORTNESS OF BREATH 25.5 Inhaler 1  . levothyroxine (SYNTHROID, LEVOTHROID) 88 MCG tablet Take 1 tablet (88 mcg total) by mouth daily. 90 tablet  1  . losartan (COZAAR) 25 MG tablet Take 1 tablet (25 mg total) by mouth daily. 90 tablet 3  . montelukast (SINGULAIR) 10 MG tablet Take 10 mg by mouth at bedtime.    Earney [employer] Bicarbonate (ZEGERID OTC PO) Take by mouth.    . Probiotic Product (PROBIOTIC PO) Take 1 tablet by mouth daily as needed (digestion).    . benzonatate (TESSALON) 200 MG capsule Take 1 capsule (200 mg total) by mouth 2 (two) times daily as needed for cough. 20 capsule 0   No facility-administered medications prior to visit.         Objective:   Physical Exam Vitals:   09/28/17 1324  BP: 110/76  Pulse: 82  SpO2: 94%  Weight: 157 lb (71.2 kg)  Height: 5\' 3"  (1.6 m)   Gen: Pleasant, well-nourished, in no distress,  normal affect  ENT: No lesions,  mouth clear,  oropharynx clear, no postnasal drip  Neck: No JVD, no stridor  Lungs: No use of accessory muscles, clear without rales or rhonchi  Cardiovascular: RRR, heart sounds normal, no murmur or gallops, no peripheral edema  Musculoskeletal: No deformities, no cyanosis or clubbing  Neuro: alert, non focal  Skin: Warm, no lesions or rash   CXR 08/14/17 -  COMPARISON:  September 10, 2016  FINDINGS: There is evidence of bullous disease in the upper lobes. There is no edema or consolidation. Heart size is normal. Pulmonary vascularity reflects underlying bullous disease in the upper lobes with diminished upper lobe vascularity. No adenopathy. There is aortic atherosclerosis. There is degenerative change in the thoracic spine.  IMPRESSION: Upper lobe bullous disease. No edema or consolidation. Aortic atherosclerosis. No evident adenopathy.      Assessment & Plan:  COPD with asthma ([place]) Her pulmonary function testing is consistent with significant hyperinflation, COPD with asthmatic features.  Based on this I think we will start treatment with breo, assess her response and then try to order this through her pharmacy if she tolerates.   Start albuterol to use as needed.  Her chest x-ray suggested severe hyperinflation versus bullous disease.  I like to see how she responds to medical therapy before committing her to CT scan to define possible bullous disease.  Allergic rhinitis She notes that Singulair sometimes causes insomnia and she does not take it reliably.  We will stop this and change to loratadine to see if this helps with her allergic rhinitis and chronic mucus clearance  Baltazar Apo, MD, PhD 09/28/2017, 2:04 PM [facility] [phone] or if no answer [phone]

## 2017-09-29 ENCOUNTER — Ambulatory Visit (INDEPENDENT_AMBULATORY_CARE_PROVIDER_SITE_OTHER): Payer: 59 | Admitting: Cardiology

## 2017-09-29 ENCOUNTER — Encounter: Payer: Self-pay | Admitting: Cardiology

## 2017-09-29 VITALS — BP 100/64 | HR 79 | Ht 63.0 in | Wt 153.0 lb

## 2017-09-29 DIAGNOSIS — R0602 Shortness of breath: Secondary | ICD-10-CM

## 2017-09-29 DIAGNOSIS — R002 Palpitations: Secondary | ICD-10-CM | POA: Diagnosis not present

## 2017-09-29 DIAGNOSIS — R0789 Other chest pain: Secondary | ICD-10-CM | POA: Diagnosis not present

## 2017-09-29 NOTE — Progress Notes (Signed)
Clinical Summary Ms. Ugarte is a 61 y.o.female seen today for follow up of the following medical problems.    1. COPD/SOB - ongoing cough, SOB. - SOB x 1.5 years. Increased cough x 2 months. Productive cough, white and foamy - DOE with walking to mailbox.  - Recent LE edema, abdpominal distension.  - can have some chest pressure at times, at rest or with activity.  - history of smoking, Appointment pending with pulmononloigst - recent normal BNP, prior echo normal LVEF with mild diasotlic dysfunction. Recent normal cath.   - followed by pulmonary. Recently started on breo and prn albuterol. Severe hyperinflation on CXR, possible CT scan by pulmonary in the future to evaluate possible bullous disease  2. History of Chest pain - prior cath 2010 - GXT 2016 without ischemia - echo 05/2015 LVEF normal, grade I diastolc dysfunction - still with atypical symptoms at times.   - symptoms unchanged since last visit  3. Palpitations - often occurs at night. Feels like butterfly in chest - 1-2 times per month. Lasts up 2 hours. - 2 cups of coffee, 1 soda a day, no energy drinks, seldom alcohol      Past Medical History:  Diagnosis Date  . Asthma   . COPD, mild ([place])    Spirometerey 3/08  . Fatty liver disease, nonalcoholic   . GERD (gastroesophageal reflux disease)   . Hyperlipidemia   . Hypertension   . Hypothyroid Since 2003  . IBS (irritable bowel syndrome)   . Perimenopausal      Allergies  Allergen Reactions  . Avelox [Moxifloxacin Hcl In Nacl] Hives  . Ciprofloxacin     REACTION: Rash  . Livalo [Pitavastatin]   . Moxifloxacin     REACTION: RASH  . Bactrim [Sulfamethoxazole-Trimethoprim] Rash  . Statins Other (See Comments)    myalgias  . Sulfamethoxazole-Trimethoprim Rash    REACTION: Rash     Current Outpatient Medications  Medication Sig Dispense Refill  . albuterol (PROVENTIL HFA;VENTOLIN HFA) 108 (90 Base) MCG/ACT inhaler TAKE 2 PUFFS BY MOUTH  EVERY 6 HOURS AS NEEDED FOR WHEEZE OR SHORTNESS OF BREATH 25.5 Inhaler 1  . levothyroxine (SYNTHROID, LEVOTHROID) 88 MCG tablet Take 1 tablet (88 mcg total) by mouth daily. 90 tablet 1  . losartan (COZAAR) 25 MG tablet Take 1 tablet (25 mg total) by mouth daily. 90 tablet 3  . montelukast (SINGULAIR) 10 MG tablet Take 10 mg by mouth at bedtime.    Earney Navy Bicarbonate (ZEGERID OTC PO) Take by mouth.    . Probiotic Product (PROBIOTIC PO) Take 1 tablet by mouth daily as needed (digestion).     No current facility-administered medications for this visit.      Past Surgical History:  Procedure Laterality Date  . ABLATION    . CARDIAC CATHETERIZATION N/A 06/12/2015   Procedure: Left Heart Cath and Coronary Angiography;  Surgeon: Burnell Blanks, MD;  Location: [facility];  Service: Cardiovascular;  Laterality: N/A;  . CESAREAN SECTION    . LTCS     x2  . SPIROMETRY  01/05/2007   FVC 68% predicted; FEV1 44% predicted; FEV1/FVC 63% predicted = moderate obstruction with low vital capacity possibly due to restriction  . TUBAL LIGATION       Allergies  Allergen Reactions  . Avelox [Moxifloxacin Hcl In Nacl] Hives  . Ciprofloxacin     REACTION: Rash  . Livalo [Pitavastatin]   . Moxifloxacin     REACTION: RASH  .  Bactrim [Sulfamethoxazole-Trimethoprim] Rash  . Statins Other (See Comments)    myalgias  . Sulfamethoxazole-Trimethoprim Rash    REACTION: Rash      Family History  Problem Relation Age of Onset  . Hypertension Father   . Hyperlipidemia Father   . Heart disease Father        Died at age 74 of MI  . Hypothyroidism Father   . Raynaud syndrome Sister   . Rheum arthritis Sister   . Thyroid disease Sister   . CAD Brother        has 4 stents  . Rheum arthritis Brother   . Heart disease Sister 90       h/o MI @age  46  . Thyroid disease Sister   . Breast cancer Neg Hx   . Ovarian cancer Neg Hx   . Prostate cancer Neg Hx   . Colon cancer Neg  Hx      Social History Ms. Akter reports that she quit smoking about 9 years ago. Her smoking use included cigarettes. She has a 15.00 pack-year smoking history. she has never used smokeless tobacco. Ms. Rampersaud reports that she drinks alcohol.   Review of Systems CONSTITUTIONAL: No weight loss, fever, chills, weakness or fatigue.  HEENT: Eyes: No visual loss, blurred vision, double vision or yellow sclerae.No hearing loss, sneezing, congestion, runny nose or sore throat.  SKIN: No rash or itching.  CARDIOVASCULAR: per hpi RESPIRATORY: per hpi GASTROINTESTINAL: No anorexia, nausea, vomiting or diarrhea. No abdominal pain or blood.  GENITOURINARY: No burning on urination, no polyuria NEUROLOGICAL: No headache, dizziness, syncope, paralysis, ataxia, numbness or tingling in the extremities. No change in bowel or bladder control.  MUSCULOSKELETAL: No muscle, back pain, joint pain or stiffness.  LYMPHATICS: No enlarged nodes. No history of splenectomy.  PSYCHIATRIC: No history of depression or anxiety.  ENDOCRINOLOGIC: No reports of sweating, cold or heat intolerance. No polyuria or polydipsia.  Marland Kitchen   Physical Examination Vitals:   09/29/17 0913  BP: 100/64  Pulse: 79  SpO2: 97%   Vitals:   09/29/17 0913  Weight: 153 lb (69.4 kg)  Height: 5\' 3"  (1.6 m)    Gen: resting comfortably, no acute distress HEENT: no scleral icterus, pupils equal round and reactive, no palptable cervical adenopathy,  CV: rrr, no mrg, no jvd Resp: Clear to auscultation bilaterally GI: abdomen is soft, non-tender, non-distended, normal bowel sounds, no hepatosplenomegaly MSK: extremities are warm, no edema.  Skin: warm, no rash Neuro:  no focal deficits Psych: appropriate affect   Diagnostic Studies 05/2015 cath 1. No angiographic evidence of CAD 2. Normal LV systolic function   11/6376 echo Study Conclusions  - Left ventricle: The cavity size was normal. There was mild concentric hypertrophy.  Systolic function was normal. The estimated ejection fraction was in the range of 60% to 65%. Wall motion was normal; there were no regional wall motion abnormalities. Doppler parameters are consistent with abnormal left ventricular relaxation (grade 1 diastolic dysfunction).  05/2015 GXT  There was no ST segment deviation noted during stress.     Assessment and Plan  1. SOB - symptoms appear to be pulmonary related based on description and prior extensive cardiac workup including echo and cath - no further cardiac workup at this time, follow with pulmonary  2. Chest pain - atypical, negative cath 2 years ago - we will continue to follow clinically  3. Palpitations - recent normal monitor. Symptoms decreased since last visit. Continue to monitor, if progress consider diltiazem.  F/u 1 year     Arnoldo Lenis, M.D.

## 2017-09-29 NOTE — Patient Instructions (Signed)

## 2017-10-03 ENCOUNTER — Encounter: Payer: Self-pay | Admitting: Cardiology

## 2017-10-12 ENCOUNTER — Telehealth: Payer: Self-pay | Admitting: Emergency Medicine

## 2017-10-12 NOTE — Telephone Encounter (Signed)
Pt is calling back she missed the call [phone]

## 2017-10-12 NOTE — Telephone Encounter (Signed)
Attempted to contact pt. No answer, no option to leave a message. Will try back.  

## 2017-10-12 NOTE — Telephone Encounter (Signed)
Spoke with pt. States that [facility] has been working well. Pt has one more sample and will call us when she is ready for a prescription. Nothing further was needed.

## 2017-10-19 ENCOUNTER — Telehealth: Payer: Self-pay | Admitting: Emergency Medicine

## 2017-10-19 MED ORDER — FLUTICASONE FUROATE-VILANTEROL 100-25 MCG/INH IN AEPB: 1.0000 | INHALATION_SPRAY | Freq: Every day | RESPIRATORY_TRACT | 2 refills | Status: DC

## 2017-10-19 NOTE — Telephone Encounter (Signed)
Ok  BREO 829 one click each am and 2 refills and ov before these run out with RB

## 2017-10-19 NOTE — Telephone Encounter (Signed)
Rx sent to the pharmacy. Called pt to let her know and but no answer, left detailed VM advising pt.

## 2017-10-19 NOTE — Telephone Encounter (Signed)
Pt is requesting Breo 100 Rx, as she was given samples at 09/28/17 with RB. I do not see Breo dosage within note nor documentation that sample was given.  Sending to DOD, to assure Breo 100 can be prescribed.

## 2017-11-10 ENCOUNTER — Encounter: Payer: Self-pay | Admitting: Family Medicine

## 2017-11-10 ENCOUNTER — Ambulatory Visit: Payer: PRIVATE HEALTH INSURANCE | Admitting: Family Medicine

## 2017-11-10 VITALS — BP 123/77 | HR 85 | Temp 98.0°F | Ht 63.0 in | Wt 160.0 lb

## 2017-11-10 DIAGNOSIS — M545 Low back pain: Secondary | ICD-10-CM | POA: Diagnosis not present

## 2017-11-10 DIAGNOSIS — M549 Dorsalgia, unspecified: Secondary | ICD-10-CM

## 2017-11-10 DIAGNOSIS — G8929 Other chronic pain: Secondary | ICD-10-CM | POA: Insufficient documentation

## 2017-11-10 MED ORDER — MELOXICAM 15 MG PO TABS: 7.5000 mg | ORAL_TABLET | Freq: Every day | ORAL | 1 refills | Status: DC | PRN

## 2017-11-10 NOTE — Progress Notes (Signed)
Subjective: CC: back pain HPI: Patient is a 62 y.o. female presenting to clinic today for back pain. Concerns today include:  1. Back Pain Patient reports that pain began over 4 years ago.  She notes that current episode has been ongoing for the last couple of months.  She reports that the back pain is intermittent and this occurred about 10 days out of this past month.  She notes that she takes ibuprofen 600-800 mg up to twice daily usually about 4 days/week.  She notes that this does seem to relieve the symptoms some but back pain always returns.  Pain is a 8/10 at its worst and a 5-6/10 with ibuprofen.  It does occasionally radiate to the right buttock.  Bending, sweeping, prolonged sitting worsens pain.  Ibuprofen improves pain.  She notes that pain seemed to start after she changed positions from a seated position to a more physically active position at [facility] several years ago.  Patient denies trauma or injury.  Denies dysuria, hematuria, fevers, chills, nausea, vomiting, abdominal pain, renal stones.   Denies saddle anesthesia, urinary retention/incontinence, bowel incontinence, weakness, falls, sensation changes or pain anywhere else. PMH positive for chronic back pain.  No h/o back surgeries.   History Reviewed: former smoker. Health Maintenance: Colonoscopy  ROS: All other systems reviewed and are negative.  Objective: Office vital signs reviewed. BP 123/77   Pulse 85   Temp 98 F (36.7 C) (Oral)   Ht 5\' 3"  (1.6 m)   Wt 160 lb (72.6 kg)   BMI 28.34 kg/m   Physical Examination:  General: Awake, alert, well nourished, NAD Extremities: Warm, well-perfused. No edema, cyanosis or clubbing; +2 pulses bilaterally MSK: Patient exhibits stiffness when rising from a seated position.  Normal gait and station  Thoracic spine: Notable increase kyphosis with a prominent left-sided rib hump.  Her AROM is somewhat limited in extension secondary to significant curvature, no midline  tenderness to palpation, no paraspinal tenderness to palpation.    Lumbar spine : Patient has pain with forward flexion and has about a 20 degree loss in flexion secondary to pain.  No pain with extension but extension is reduced.  She has pain with rotation of the lumbar spine to the right.  No pain with rotation of the lumbar spine to the left.  She has pain predominantly on the right over the lumbosacral junction.  No midline tenderness or paraspinal tenderness to palpation.  No palpable bony deformities, negative straight leg test Neuro: 5/5 lower extremity strength; lower extremity light touch sensation grossly intact, normal heel walk, Toe Walk and Tandem Walk  Assessment/ Plan: 62 y.o. female   1. Chronic bilateral low back pain without sciatica Likely secondary to degenerative changes in the back.  She has no degenerative changes in the thoracic spine.  She has a known scoliotic curve with kyphosis of thoracic spine.  Her physical exam was remarkable for tenderness to palpation of the right lumbosacral junction.  No focal neurologic deficits.  Gait and independent ambulation are preserved.  No red flags on exam.  She is responded well to meloxicam in the past.  Discontinue Motrin.  Avoid other NSAIDs.  We discussed that chronic use of oral NSAIDs would not be ideal but the benefits at this time are greater than the risks of chronic use.  Blood pressures well controlled.  No history of GI bleed.  No history of MI or stroke.  I think that this is safe to use at  this time so as to avoid invasive measurements to improve back pain.  We also discussed consideration for physical therapy and dry needling.  Patient is amenable to this if oral medications do not help.  However, she is currently undergoing a lot of testing for a newly diagnosed pulmonary bullous disease.  Will try to be respectful of patient's busy schedule and financial restraints.  Home care instructions were reviewed with the patient.  I  recommended that she does remain physically active so as to reduce risk of muscle spasticity.  Red flag signs reviewed with the patient.  She will follow-up as needed for this issue.  Meds ordered this encounter  Medications  . meloxicam (MOBIC) 15 MG tablet    Sig: Take 0.5-1 tablets (7.5-15 mg total) by mouth daily as needed for pain (low back pain).    Dispense:  30 tablet    Refill:  [place], DO [place]

## 2017-11-23 ENCOUNTER — Telehealth: Payer: Self-pay | Admitting: Family Medicine

## 2017-11-23 NOTE — Telephone Encounter (Signed)
Pt as an appt with Dr. Darnell Level tomorrow

## 2017-11-24 ENCOUNTER — Ambulatory Visit: Payer: PRIVATE HEALTH INSURANCE | Admitting: Family Medicine

## 2017-11-24 VITALS — BP 129/83 | HR 83 | Temp 96.9°F

## 2017-11-24 DIAGNOSIS — J0110 Acute frontal sinusitis, unspecified: Secondary | ICD-10-CM

## 2017-11-24 MED ORDER — BENZONATATE 200 MG PO CAPS: 200.0000 mg | ORAL_CAPSULE | Freq: Two times a day (BID) | ORAL | 0 refills | Status: DC | PRN

## 2017-11-24 MED ORDER — AMOXICILLIN-POT CLAVULANATE 875-125 MG PO TABS: 1.0000 | ORAL_TABLET | Freq: Two times a day (BID) | ORAL | 0 refills | Status: DC

## 2017-11-24 NOTE — Progress Notes (Signed)
Subjective: CC: URI symptoms PCP: [name], DO [facility] Stacy [name] is Stacy 62 y.o. female presenting to clinic today for:  1. Cold symptoms  Patient reports sinus pressure, nasal congestion, sinus headache, green drainage from the nail years, subjective fevers that started 4 days ago.  she reports that her stepson was sick with Stacy URI recently.  Denies hemoptysis, worsening shortness of breath, dizziness, rash, nausea, vomiting, diarrhea, chills, myalgia, recent travel.  Patient has used plain Mucinex with little relief of symptoms.  She has Stacy history of COPD.  Denies tobacco use/ exposure.    ROS: Per HPI  Allergies  Allergen Reactions  . Avelox [Moxifloxacin Hcl In Nacl] Hives  . Ciprofloxacin     REACTION: Rash  . Livalo [Pitavastatin]   . Moxifloxacin     REACTION: RASH  . Bactrim [Sulfamethoxazole-Trimethoprim] Rash  . Statins Other (See Comments)    myalgias  . Sulfamethoxazole-Trimethoprim Rash    REACTION: Rash   Past Medical History:  Diagnosis Date  . Asthma   . COPD, mild ([place])    Spirometerey 3/08  . Fatty liver disease, nonalcoholic   . GERD (gastroesophageal reflux disease)   . Hyperlipidemia   . Hypertension   . Hypothyroid Since 2003  . IBS (irritable bowel syndrome)   . Perimenopausal     Current Outpatient Medications:  .  albuterol (PROVENTIL HFA;VENTOLIN HFA) 108 (90 Base) MCG/ACT inhaler, TAKE 2 PUFFS BY MOUTH EVERY 6 HOURS AS NEEDED FOR WHEEZE OR SHORTNESS OF BREATH, Disp: 25.5 Inhaler, Rfl: 1 .  Fluticasone Furoate-Vilanterol (BREO ELLIPTA IN), Inhale into the lungs., Disp: , Rfl:  .  fluticasone furoate-vilanterol (BREO ELLIPTA) 100-25 MCG/INH AEPB, Inhale 1 puff into the lungs daily., Disp: 60 each, Rfl: 2 .  levothyroxine (SYNTHROID, LEVOTHROID) 88 MCG tablet, Take 1 tablet (88 mcg total) by mouth daily., Disp: 90 tablet, Rfl: 1 .  loratadine (CLARITIN) 10 MG tablet, Take 10 mg by mouth daily., Disp: , Rfl:  .  losartan (COZAAR) 25 MG  tablet, Take 1 tablet (25 mg total) by mouth daily., Disp: 90 tablet, Rfl: 3 .  meloxicam (MOBIC) 15 MG tablet, Take 0.5-1 tablets (7.5-15 mg total) by mouth daily as needed for pain (low back pain)., Disp: 30 tablet, Rfl: 1 .  Omeprazole-Sodium Bicarbonate (ZEGERID OTC PO), Take by mouth., Disp: , Rfl:  .  Probiotic Product (PROBIOTIC PO), Take 1 tablet by mouth daily as needed (digestion)., Disp: , Rfl:  Social History   Socioeconomic History  . Marital status: Divorced    Spouse name: Not on file  . Number of children: 2  . Years of education: Not on file  . Highest education level: Not on file  Social Needs  . Financial resource strain: Not on file  . Food insecurity - worry: Not on file  . Food insecurity - inability: Not on file  . Transportation needs - medical: Not on file  . Transportation needs - non-medical: Not on file  Occupational History  . Occupation: Works in Set designer  . Smoking status: Former Smoker    Packs/day: 0.50    Years: 30.00    Pack years: 15.00    Types: Cigarettes    Last attempt to quit: 10/25/2007    Years since quitting: 10.0  . Smokeless tobacco: Never Used  Substance and Sexual Activity  . Alcohol use: Yes    Alcohol/week: 0.0 oz    Comment: Occasional-once Stacy year  . Drug use: No  .  Sexual activity: Yes    Birth control/protection: Surgical  Other Topics Concern  . Not on file  Social History Narrative   Remarried.    Previous occupation: Customer service manager.  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      Moved from [facility] in [place]   2 children, has stepchildren and grandchildren   Does not exercise   Family History  Problem Relation Age of Onset  . Hypertension Father   . Hyperlipidemia Father   . Heart disease Father        Died at age 40 of MI  . Hypothyroidism Father   . Raynaud syndrome Sister   . Rheum arthritis Sister   . Thyroid disease Sister   . CAD Brother        has 4 stents  . Rheum arthritis Brother   . Heart  disease Sister 29       h/o MI @age  71  . Thyroid disease Sister   . Breast cancer Neg Hx   . Ovarian cancer Neg Hx   . Prostate cancer Neg Hx   . Colon cancer Neg Hx     Objective: Office vital signs reviewed. BP 129/83   Pulse 83   Temp (!) 96.9 F (36.1 C) (Oral)   Physical Examination:  General: Awake, alert, well nourished, nontoxic appearing, No acute distress HEENT: +frontal sinus TTP    Neck: No masses palpated. No lymphadenopathy    Ears: Tympanic membranes intact, normal light reflex, no erythema, no bulging    Eyes: PERRLA, extraocular membranes intact, sclera white    Nose: nasal turbinates moist, clear nasal discharge    Throat: moist mucus membranes, no erythema, no tonsillar exudate.  Airway is patent Cardio: regular rate and rhythm, S1S2 heard, no murmurs appreciated Pulm: clear to auscultation bilaterally, no wheezes, rhonchi or rales; normal work of breathing on room air  Depression screen [facility] 2/9 11/24/2017 11/10/2017 08/14/2017  Decreased Interest 0 2 1  Down, Depressed, Hopeless 0 2 1  PHQ - 2 Score 0 4 2  Altered sleeping - 3 1  Tired, decreased energy - 3 3  Change in appetite - 3 2  Feeling bad or failure about yourself  - 1 1  Trouble concentrating - 2 2  Moving slowly or fidgety/restless - 0 2  Suicidal thoughts - 0 -  PHQ-9 Score - 16 13  Difficult doing work/chores - Somewhat difficult -     Assessment/ Plan: 62 y.o. female   1. Acute non-recurrent frontal sinusitis I think that this is Stacy viral sinusitis at this point.  Patient is afebrile well-appearing with normal vital signs.  I did discuss with her home care instructions for viral URI management.  Tessalon Perles was provided for the mild nonproductive cough.  She was given Stacy pocket prescription for Augmentin and indications for use were reviewed with the patient.  She voiced good understanding.  She will follow-up as needed.  Meds ordered this encounter  Medications  . benzonatate  (TESSALON) 200 MG capsule    Sig: Take 1 capsule (200 mg total) by mouth 2 (two) times daily as needed for cough.    Dispense:  20 capsule    Refill:  0  . amoxicillin-clavulanate (AUGMENTIN) 875-125 MG tablet    Sig: Take 1 tablet by mouth 2 (two) times daily.    Dispense:  20 tablet    Refill:  0     Stacy Windell Moulding, DO [place] [phone])  [phone]   

## 2017-11-24 NOTE — Patient Instructions (Signed)
It appears that you have a viral upper respiratory infection (cold).  Cold symptoms can last up to 2 weeks.  I recommend that you only use cold medications that are safe in high blood pressure like Coricidin (generic is fine).  Other cold medications can increase your blood pressure.  As we discussed, I have given you a pocket prescription of Augmentin to use if you develop any of those signs or symptoms that we discussed.    - Get plenty of rest and drink plenty of fluids. - Try to breathe moist air. Use a cold mist humidifier. - Consume warm fluids (soup or tea) to provide relief for a stuffy nose and to loosen phlegm. - For nasal stuffiness, try saline nasal spray or a Neti Pot.  Afrin nasal spray can also be used but this product should not be used longer than 3 days or it will cause rebound nasal stuffiness (worsening nasal congestion). - For sore throat pain relief: suck on throat lozenges, hard candy or popsicles; gargle with warm salt water (1/4 tsp. salt per 8 oz. of water); and eat soft, bland foods. - Eat a well-balanced diet. If you cannot, ensure you are getting enough nutrients by taking a daily multivitamin. - Avoid dairy products, as they can thicken phlegm. - Avoid alcohol, as it impairs your body's immune system.  CONTACT YOUR DOCTOR IF YOU EXPERIENCE ANY OF THE FOLLOWING: - High fever - Ear pain - Sinus-type headache - Unusually severe cold symptoms - Cough that gets worse while other cold symptoms improve - Flare up of any chronic lung problem, such as asthma - Your symptoms persist longer than 2 weeks

## 2017-12-08 ENCOUNTER — Other Ambulatory Visit: Payer: Self-pay | Admitting: Family Medicine

## 2017-12-08 DIAGNOSIS — E038 Other specified hypothyroidism: Secondary | ICD-10-CM

## 2017-12-26 ENCOUNTER — Telehealth: Payer: Self-pay | Admitting: Family Medicine

## 2017-12-26 NOTE — Telephone Encounter (Signed)
Please review and advise.

## 2017-12-27 ENCOUNTER — Other Ambulatory Visit: Payer: Self-pay | Admitting: Family Medicine

## 2017-12-27 MED ORDER — LISINOPRIL 10 MG PO TABS: 10.0000 mg | ORAL_TABLET | Freq: Every day | ORAL | 0 refills | Status: DC

## 2017-12-27 NOTE — Telephone Encounter (Signed)
Lisinopril 10 mg sent into pharmacy to replace Cozaar 25 mg.  Have patient follow-up in 1 week for blood pressure check w/ RN on new medication.

## 2017-12-27 NOTE — Progress Notes (Signed)
Lisinopril 10 mg sent into pharmacy to replace Cozaar 25 mg.  Have patient follow-up in 1 week for blood pressure check on new medication.

## 2017-12-27 NOTE — Telephone Encounter (Signed)
Pt notified of RX Will come in next week for BP check

## 2017-12-28 ENCOUNTER — Ambulatory Visit: Payer: 59 | Admitting: Emergency Medicine

## 2017-12-28 ENCOUNTER — Encounter: Payer: Self-pay | Admitting: Emergency Medicine

## 2017-12-28 VITALS — BP 118/76 | HR 80 | Ht 63.0 in | Wt 164.0 lb

## 2017-12-28 DIAGNOSIS — J439 Emphysema, unspecified: Secondary | ICD-10-CM | POA: Diagnosis not present

## 2017-12-28 DIAGNOSIS — R0602 Shortness of breath: Secondary | ICD-10-CM

## 2017-12-28 DIAGNOSIS — M25511 Pain in right shoulder: Secondary | ICD-10-CM | POA: Diagnosis not present

## 2017-12-28 MED ORDER — UMECLIDINIUM-VILANTEROL 62.5-25 MCG/INH IN AEPB: 1.0000 | INHALATION_SPRAY | Freq: Every day | RESPIRATORY_TRACT | 0 refills | Status: DC

## 2017-12-28 NOTE — Assessment & Plan Note (Signed)
Obstruction based on spirometry.  She got some benefit from [facility] but is still dyspneic.  I'd like to try changing her to [place] to see if she benefits more.  If so we will continue.  She needs a walking oximetry today.  She has apical emphysematous change by chest x-ray.  We discussed briefly the possibility of lung volume reduction surgery.  I will perform a CT scan of her chest to try and define any areas of bullous disease.

## 2017-12-28 NOTE — Progress Notes (Signed)
Subjective:    Patient ID: Stacy [name], female    DOB: 1956/03/09, 62 y.o.   MRN: [id number]  Shortness of Breath  Associated symptoms include headaches, leg swelling and wheezing.   62 year old former smoker (40+ pack years), history of hypertension, hyperlipidemia, hypothyroidism, irritable bowel syndrome.  There is a history of COPD vs asthma that was made about 2007 clinically and by spirometry 2008.  She is referred today for evaluation of chronic cough of 2 months duration, exertional dyspnea. 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 In retrospect she may have had some dyspnea even before that. She has increased wheeze. The cough is prod of thick white or frothy.  She hears wheeze at night. The warehouse exposure is now over.   ROV 09/28/17 --this is a follow-up visit for patient with a history of tobacco use, significant occupational dust exposure, and some evidence for hyperinflation versus bullous disease by imaging.  She returns today for further evaluation and alpha-1 antitrypsin genotype was MM, level 116 mg/dL. Her cough is better than last visit. Still coughs some.  She doesn't take singulair reliably.  She has exertional dyspnea and dyspnea especially with spells of coughing.  Has some clear mucus that she needs to cough up every day.  She underwent pulmonary function testing today that I have personally reviewed.  This shows very severe obstruction with a positive bronchodilator response, severe hyperinflation of her lung volumes, decreased diffusion capacity with correction to the normal range when adjusted for alveolar volume.  She reports that she has been on Spiriva before had to stop this due to myalgias.  She is also been on Asmanex in the past but this was too costly to continue.  ROV 12/28/17 --62 year old woman who follows up today for COPD  with asthmatic features.  Also has a history of allergic rhinitis.  At her last visit in December 2018 we started her on Breo. She is tolerating. She continues to have exertional dyspnea but a bit better than last time. Still limited. Her cough is better. She has some R shoulder discomfort that comes and goes, she compares it to an episode of subcutaneous air in her shoulder that she had when she had a c-section many years ago. She was just treated for green nasal congestion with abx.    Review of Systems  HENT: Positive for congestion, postnasal drip and sinus pressure.   Respiratory: Positive for cough, chest tightness, shortness of breath and wheezing.   Cardiovascular: Positive for palpitations and leg swelling.  Allergic/Immunologic: Negative.   Neurological: Positive for headaches.  Psychiatric/Behavioral: The patient is nervous/anxious.     Past Medical History:  Diagnosis Date  . Asthma   . COPD, mild ([place])    Spirometerey 3/08  . Fatty liver disease, nonalcoholic   . GERD (gastroesophageal reflux disease)   . Hyperlipidemia   . Hypertension   . Hypothyroid Since 2003  . IBS (irritable bowel syndrome)   . Perimenopausal      Family History  Problem Relation Age of Onset  . Hypertension Father   . Hyperlipidemia Father   . Heart disease Father        Died at age 37 of MI  . Hypothyroidism Father   . Raynaud syndrome Sister   . Rheum arthritis Sister   . Thyroid disease Sister   .  CAD Brother        has 4 stents  . Rheum arthritis Brother   . Heart disease Sister 34       h/o MI @age  35  . Thyroid disease Sister   . Breast cancer Neg Hx   . Ovarian cancer Neg Hx   . Prostate cancer Neg Hx   . 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  No other occupational exposure.  Has lived in [place] and [place] No military   Allergies  Allergen Reactions  . Avelox [Moxifloxacin Hcl In Nacl] Hives  . Ciprofloxacin     REACTION: Rash  . Livalo  [Pitavastatin]   . Moxifloxacin     REACTION: RASH  . Bactrim [Sulfamethoxazole-Trimethoprim] Rash  . Statins Other (See Comments)    myalgias  . Sulfamethoxazole-Trimethoprim Rash    REACTION: Rash     Outpatient Medications Prior to Visit  Medication Sig Dispense Refill  . albuterol (PROVENTIL HFA;VENTOLIN HFA) 108 (90 Base) MCG/ACT inhaler TAKE 2 PUFFS BY MOUTH EVERY 6 HOURS AS NEEDED FOR WHEEZE OR SHORTNESS OF BREATH 25.5 Inhaler 1  . benzonatate (TESSALON) 200 MG capsule Take 1 capsule (200 mg total) by mouth 2 (two) times daily as needed for cough. 20 capsule 0  . Fluticasone Furoate-Vilanterol (BREO ELLIPTA IN) Inhale into the lungs.    . fluticasone furoate-vilanterol (BREO ELLIPTA) 100-25 MCG/INH AEPB Inhale 1 puff into the lungs daily. 60 each 2  . levothyroxine (SYNTHROID, LEVOTHROID) 88 MCG tablet TAKE 1 TABLET BY MOUTH EVERY DAY 90 tablet 1  . lisinopril (PRINIVIL,ZESTRIL) 10 MG tablet Take 1 tablet (10 mg total) by mouth daily. 90 tablet 0  . loratadine (CLARITIN) 10 MG tablet Take 10 mg by mouth daily.    . meloxicam (MOBIC) 15 MG tablet Take 0.5-1 tablets (7.5-15 mg total) by mouth daily as needed for pain (low back pain). 30 tablet 1  . Omeprazole-Sodium Bicarbonate (ZEGERID OTC PO) Take by mouth.    . Probiotic Product (PROBIOTIC PO) Take 1 tablet by mouth daily as needed (digestion).    Marland Kitchen amoxicillin-clavulanate (AUGMENTIN) 875-125 MG tablet Take 1 tablet by mouth 2 (two) times daily. 20 tablet 0   No facility-administered medications prior to visit.         Objective:   Physical Exam Vitals:   12/28/17 1325  BP: 118/76  Pulse: 80  SpO2: 96%  Weight: 164 lb (74.4 kg)  Height: 5\' 3"  (1.6 m)   Gen: Pleasant, well-nourished, in no distress,  normal affect  ENT: No lesions,  mouth clear,  oropharynx clear, no postnasal drip  Neck: No JVD, no stridor  Lungs: No use of accessory muscles, clear without rales or rhonchi  Cardiovascular: RRR, heart sounds  normal, no murmur or gallops, no peripheral edema  Musculoskeletal: No deformities, no cyanosis or clubbing, some tenderness to palpation of her right upper pectoral region without any crepitance  Neuro: alert, non focal  Skin: Warm, no lesions or rash   CXR 08/14/17 -  COMPARISON:  September 10, 2016  FINDINGS: There is evidence of bullous disease in the upper lobes. There is no edema or consolidation. Heart size is normal. Pulmonary vascularity reflects underlying bullous disease in the upper lobes with diminished upper lobe vascularity. No adenopathy. There is aortic atherosclerosis. There is degenerative change in the thoracic spine.  IMPRESSION: Upper lobe bullous disease. No edema or consolidation. Aortic atherosclerosis. No evident adenopathy.  Assessment & Plan:  COPD with asthma ([place]) Obstruction based on spirometry.  She got some benefit from [facility] but is still dyspneic.  I'd like to try changing her to Anoro to see if she benefits more.  If so we will continue.  She needs a walking oximetry today.  She has apical emphysematous change by chest x-ray.  We discussed briefly the possibility of lung volume reduction surgery.  I will perform a CT scan of her chest to try and define any areas of bullous disease.  Shoulder pain, right Based on exam this is actually more of upper right chest discomfort.  I do not feel any crepitance.  She does relate this to an episode in her past when she had subcutaneous air.  The CT scan of the chest should show [place] if she has any small pneumothorax.  Baltazar Apo, MD, PhD 12/28/2017, 1:56 PM [facility] [phone] or if no answer [phone]

## 2017-12-28 NOTE — Patient Instructions (Signed)
We will perform CT scan of your chest without contrast at [facility].  Stop Breo for now.  We will try starting Anoro once a day. If you benefit more from this medication then we will continue it, order it through your pharmacy.  Walking oximetry today on room air.  Follow with Dr Lamonte Sakai in 1 month

## 2017-12-28 NOTE — Assessment & Plan Note (Signed)
Based on exam this is actually more of upper right chest discomfort.  I do not feel any crepitance.  She does relate this to an episode in her past when she had subcutaneous air.  The CT scan of the chest should show [place] if she has any small pneumothorax.

## 2018-01-01 ENCOUNTER — Telehealth: Payer: Self-pay | Admitting: Emergency Medicine

## 2018-01-01 DIAGNOSIS — J439 Emphysema, unspecified: Secondary | ICD-10-CM

## 2018-01-01 NOTE — Telephone Encounter (Signed)
I called & canceled CT appt at [place] faxed order to [facility] as requested.  Nothing further needed.

## 2018-01-01 NOTE — Telephone Encounter (Signed)
I called pt to verify. She states she is ok with going to [facility] because its a 600 dollars difference. I placed another order and will route to PCC's.

## 2018-01-03 ENCOUNTER — Ambulatory Visit (HOSPITAL_COMMUNITY): Payer: 59

## 2018-01-05 ENCOUNTER — Telehealth: Payer: Self-pay | Admitting: Family Medicine

## 2018-01-05 NOTE — Telephone Encounter (Signed)
Pt is going to take losartan until Monday and ask Dr. Darnell Level

## 2018-01-08 ENCOUNTER — Telehealth: Payer: Self-pay | Admitting: Emergency Medicine

## 2018-01-08 NOTE — Telephone Encounter (Signed)
Ok to continue Losartan instead of Lisinopril if she is not having a cough with this.

## 2018-01-08 NOTE — Telephone Encounter (Signed)
Pt aware.

## 2018-01-08 NOTE — Telephone Encounter (Signed)
LMTCB

## 2018-01-09 NOTE — Telephone Encounter (Signed)
lmtcb x2 for pt. 

## 2018-01-10 NOTE — Telephone Encounter (Signed)
Spoke with the pt  She is calling to give update on anoro  She feels less SOB but has more congestion  She states her throat "feels gunky" She was recently taken off on losartan and put on lisinopril, but has already went back to losartan b/c she was told this could cause cough  She is asking if she could try another month of the Anoro to see how she does  Please advise, thanks!

## 2018-01-10 NOTE — Telephone Encounter (Signed)
Yes ok to stay on the Anoro, see how the sx settle out. If she has any worsening then we can asdjust sooner. Have her call us for any changes.

## 2018-01-10 NOTE — Telephone Encounter (Signed)
Patient returning call - she can be reached at [phone] -pr

## 2018-01-11 MED ORDER — UMECLIDINIUM-VILANTEROL 62.5-25 MCG/INH IN AEPB: 1.0000 | INHALATION_SPRAY | Freq: Every day | RESPIRATORY_TRACT | 1 refills | Status: DC

## 2018-01-11 NOTE — Telephone Encounter (Signed)
Pt is aware of below message and voiced her understanding. Rx for Anoro has been sent to preferred pharmacy, as pt will complete sample of anoro tomorrow. Nothing further is needed.

## 2018-01-18 ENCOUNTER — Other Ambulatory Visit: Payer: Self-pay | Admitting: Internal Medicine

## 2018-01-19 ENCOUNTER — Encounter: Payer: Self-pay | Admitting: Emergency Medicine

## 2018-01-19 LAB — PULMONARY FUNCTION TEST

## 2018-01-23 ENCOUNTER — Ambulatory Visit: Payer: PRIVATE HEALTH INSURANCE | Admitting: Family Medicine

## 2018-01-23 ENCOUNTER — Encounter: Payer: Self-pay | Admitting: Family Medicine

## 2018-01-23 VITALS — BP 134/84 | HR 82 | Temp 98.0°F | Ht 63.0 in | Wt 165.0 lb

## 2018-01-23 DIAGNOSIS — S61211A Laceration without foreign body of left index finger without damage to nail, initial encounter: Secondary | ICD-10-CM | POA: Diagnosis not present

## 2018-01-23 DIAGNOSIS — Z23 Encounter for immunization: Secondary | ICD-10-CM

## 2018-01-23 NOTE — Progress Notes (Signed)
BP 134/84   Pulse 82   Temp 98 F (36.7 C) (Oral)   Ht 5\' 3"  (1.6 m)   Wt 165 lb (74.8 kg)   BMI 29.23 kg/m    Subjective:    Patient ID: Stacy [name], female    DOB: Dec 28, 1955, 62 y.o.   MRN: [id number]  HPI: Stacy [name] is a 62 y.o. female presenting on 01/23/2018 for Cut left index finger with top of tin can (Tdap given today)   HPI Left index finger laceration Patient is coming in today because she sliced her left index finger with a 10 can.  She says she did this earlier today and it has been bleeding.  She is tried to wrap it and she did clean it out really good with water and soap but it is still been bleeding and that is why she is coming in now.  It is a small spot near the tip of her left index finger on the lateral aspect.  She denies any fevers or chills or purulent drainage.  She cannot recall having a tetanus shot recently and does think she is needed for 1.  She denies any numbness or weakness or loss of range of motion.  Relevant past medical, surgical, family and social history reviewed and updated as indicated. Interim medical history since our last visit reviewed. Allergies and medications reviewed and updated.  Review of Systems  Constitutional: Negative for chills and fever.  Eyes: Negative for visual disturbance.  Respiratory: Negative for chest tightness and shortness of breath.   Cardiovascular: Negative for chest pain and leg swelling.  Skin: Positive for wound. Negative for rash.  All other systems reviewed and are negative.   Per HPI unless specifically indicated above        Objective:    BP 134/84   Pulse 82   Temp 98 F (36.7 C) (Oral)   Ht 5\' 3"  (1.6 m)   Wt 165 lb (74.8 kg)   BMI 29.23 kg/m   Wt Readings from Last 3 Encounters:  01/23/18 165 lb (74.8 kg)  12/28/17 164 lb (74.4 kg)  11/10/17 160 lb (72.6 kg)    Physical Exam  Constitutional: She is oriented to person, place, and time. She appears well-developed and  well-nourished. No distress.  Eyes: Conjunctivae are normal.  Neurological: She is alert and oriented to person, place, and time.  Skin: Skin is warm and dry. Laceration (Small superficial laceration on the lateral aspect of left index finger, no foreign bodies noted, continued bleeding, no flap of skin, appears that she is shaved off her brace the skin off in that area no loss of range of motion or numbness or weakness ) noted. No rash noted. She is not diaphoretic.  Nursing note and vitals reviewed.  Cover with triple antibiotic and simple bandage for 3 days and then keep open to air.  Keep relatively dry and clean    Assessment & Plan:   Problem List Items Addressed This Visit    None    Visit Diagnoses    Laceration of left index finger without foreign body without damage to nail, initial encounter    -  Primary   superficial, acheived hemostasis with silver nitrate.  Cover with triple antibiotic keep mostly dry and clean for at least 3 days       Follow up plan: Return if symptoms worsen or fail to improve.  Counseling provided for all of the vaccine components Orders Placed This  Encounter  Procedures  . Tdap vaccine greater than or equal to 7yo IM    Caryl Pina, MD [facility] 01/23/2018, 12:26 PM

## 2018-01-26 ENCOUNTER — Ambulatory Visit: Payer: 59 | Admitting: Emergency Medicine

## 2018-01-26 ENCOUNTER — Encounter: Payer: Self-pay | Admitting: Emergency Medicine

## 2018-01-26 DIAGNOSIS — J449 Chronic obstructive pulmonary disease, unspecified: Secondary | ICD-10-CM | POA: Diagnosis not present

## 2018-01-26 DIAGNOSIS — J301 Allergic rhinitis due to pollen: Secondary | ICD-10-CM | POA: Diagnosis not present

## 2018-01-26 MED ORDER — FLUTICASONE PROPIONATE 50 MCG/ACT NA SUSP: 2.0000 | Freq: Every day | NASAL | 2 refills | Status: DC

## 2018-01-26 NOTE — Assessment & Plan Note (Signed)
Continue loratadine daily Start fluticasone nasal spray, 2 sprays each nostril daily

## 2018-01-26 NOTE — Patient Instructions (Addendum)
We will obtain a copy of your CT chest from [facility].  We may decide to revisit referral for lung volume reduction surgery.  Please continue your Anoro as you are taking it.  Keep albuterol available to use if needed for chest tightness, wheeze, shortness of breath Continue loratadine daily Start fluticasone nasal spray, 2 sprays each nostril daily Follow with Dr Lamonte Sakai in 2 months or sooner if you have any problems.

## 2018-01-26 NOTE — Progress Notes (Signed)
Subjective:    Patient ID: Stacy [name], female    DOB: [birth year], 62 y.o.   MRN: [id number]  Shortness of Breath  Associated symptoms include headaches, leg swelling and wheezing.   62 year old former smoker (40+ pack years), history of hypertension, hyperlipidemia, hypothyroidism, irritable bowel syndrome.  There is a history of COPD vs asthma that was made about 2007 clinically and by spirometry 2008.  She is referred today for evaluation of chronic cough of 2 months duration, exertional dyspnea. 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 In retrospect she may have had some dyspnea even before that. She has increased wheeze. The cough is prod of thick white or frothy.  She hears wheeze at night. The warehouse exposure is now over.   ROV 09/28/17 --this is a follow-up visit for patient with a history of tobacco use, significant occupational dust exposure, and some evidence for hyperinflation versus bullous disease by imaging.  She returns today for further evaluation and alpha-1 antitrypsin genotype was MM, level 116 mg/dL. Her cough is better than last visit. Still coughs some.  She doesn't take singulair reliably.  She has exertional dyspnea and dyspnea especially with spells of coughing.  Has some clear mucus that she needs to cough up every day.  She underwent pulmonary function testing today that I have personally reviewed.  This shows very severe obstruction with a positive bronchodilator response, severe hyperinflation of her lung volumes, decreased diffusion capacity with correction to the normal range when adjusted for alveolar volume.  She reports that she has been on Spiriva before had to stop this due to myalgias.  She is also been on Asmanex in the past but this was too costly to continue.  ROV 12/28/17 --62 year old woman who follows up today for COPD  with asthmatic features.  Also has a history of allergic rhinitis.  At her last visit in December 2018 we started her on Breo. She is tolerating. She continues to have exertional dyspnea but a bit better than last time. Still limited. Her cough is better. She has some R shoulder discomfort that comes and goes, she compares it to an episode of subcutaneous air in her shoulder that she had when she had a c-section many years ago. She was just treated for green nasal congestion with abx.   ROV 01/26/18 --follow-up visit today for COPD with a bronchodilator response, asthmatic features.  She also has allergic rhinitis, chronic cough.  She has a chest x-ray that suggested some apical emphysematous change and hyperinflation.  This prompted a CT scan of the chest that was done at [facility] 01/05/18.  I have the report available but I cannot see the images.  They made note of severe confluent centrilobular emphysema in the bilateral upper lobes.  There are no pulmonary nodules, no interstitial lung disease. We changed her to Anoro from [facility] > she feels that this change helped her breathing but that she is having more mucous production. Difficult to say whether she is missing the ICS vs having more PND since its allergy season. She wants to go back to work, but still feels too limited.   Review of Systems  HENT: Positive for congestion, postnasal drip and sinus pressure.   Respiratory: Positive for cough, chest tightness, shortness of breath and wheezing.   Cardiovascular: Positive for palpitations and leg swelling.  Allergic/Immunologic: Negative.   Neurological: Positive for headaches.  Psychiatric/Behavioral: The patient is nervous/anxious.     Past Medical History:  Diagnosis Date  . Asthma   . COPD, mild ([place])    Spirometerey 3/08  . Fatty liver disease, nonalcoholic   . GERD (gastroesophageal reflux disease)   . Hyperlipidemia   . Hypertension   . Hypothyroid Since 2003  . IBS (irritable bowel  syndrome)   . Perimenopausal      Family History  Problem Relation Age of Onset  . Hypertension Father   . Hyperlipidemia Father   . Heart disease Father        Died at age 89 of MI  . Hypothyroidism Father   . Raynaud syndrome Sister   . Rheum arthritis Sister   . Thyroid disease Sister   . CAD Brother        has 4 stents  . Rheum arthritis Brother   . Heart disease Sister 86       h/o MI @age  22  . Thyroid disease Sister   . Breast cancer Neg Hx   . Ovarian cancer Neg Hx   . Prostate cancer Neg Hx   . 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  No other occupational exposure.  Has lived in [place] and [place] No military   Allergies  Allergen Reactions  . Avelox [Moxifloxacin Hcl In Nacl] Hives  . Ciprofloxacin     REACTION: Rash  . Livalo [Pitavastatin]   . Moxifloxacin     REACTION: RASH  . Bactrim [Sulfamethoxazole-Trimethoprim] Rash  . Statins Other (See Comments)    myalgias  . Sulfamethoxazole-Trimethoprim Rash    REACTION: Rash     Outpatient Medications Prior to Visit  Medication Sig Dispense Refill  . albuterol (PROVENTIL HFA;VENTOLIN HFA) 108 (90 Base) MCG/ACT inhaler TAKE 2 PUFFS BY MOUTH EVERY 6 HOURS AS NEEDED FOR WHEEZE OR SHORTNESS OF BREATH 25.5 Inhaler 1  . benzonatate (TESSALON) 200 MG capsule Take 1 capsule (200 mg total) by mouth 2 (two) times daily as needed for cough. 20 capsule 0  . levothyroxine (SYNTHROID, LEVOTHROID) 88 MCG tablet TAKE 1 TABLET BY MOUTH EVERY DAY 90 tablet 1  . loratadine (CLARITIN) 10 MG tablet Take 10 mg by mouth daily.    Marland Kitchen losartan (COZAAR) 25 MG tablet Take 25 mg by mouth daily.    . meloxicam (MOBIC) 15 MG tablet Take 0.5-1 tablets (7.5-15 mg total) by mouth daily as needed for pain (low back pain). 30 tablet 1  . Omeprazole-Sodium Bicarbonate (ZEGERID OTC PO) Take by mouth.    . Probiotic Product (PROBIOTIC PO) Take 1 tablet by mouth daily as needed (digestion).    Marland Kitchen umeclidinium-vilanterol  (ANORO ELLIPTA) 62.5-25 MCG/INH AEPB Inhale 1 puff into the lungs daily. 60 each 1  . Fluticasone Furoate-Vilanterol (BREO ELLIPTA IN) Inhale into the lungs.    . fluticasone furoate-vilanterol (BREO ELLIPTA) 100-25 MCG/INH AEPB Inhale 1 puff into the lungs daily. 60 each 2   No facility-administered medications prior to visit.         Objective:   Physical Exam Vitals:   01/26/18 1520  BP: 124/82  Pulse: 100  SpO2: 94%  Weight: 164 lb 9.6 oz (74.7 kg)  Height: 5\' 3"  (1.6 m)   Gen: Pleasant, well-nourished, in no distress,  normal affect  ENT: No lesions,  mouth clear,  oropharynx clear, no postnasal drip  Neck: No JVD, no stridor  Lungs: No use of accessory muscles, clear without rales or rhonchi  Cardiovascular: RRR, heart sounds normal, no murmur or gallops, no peripheral edema  Musculoskeletal: No deformities, no cyanosis or clubbing  Neuro: alert, non focal  Skin: Warm, no lesions or rash     CT CHEST WO CONTRAST, 01/05/2018 3:38 PM  INDICATION:ped, pneumothorax, known, xray done, blebs or bulbae suspected \ \ J43.9 Emphysematous bleb ([facility])  COMPARISON: None available  FINDINGS:  Thoracic inlet/central airways: Thyroid normal. Airway patent.     Mediastinum/hila/axilla: No adenopathy.     Heart/vessels: Normal heart size. No pericardial effusion. Aorta normal in caliber. Mild coronary arterial calcifications. Lungs/pleura: Severe confluent centrilobular emphysema is identified probably involving the bilateral upper lobes. Advanced obstructive characteristics are present. Bilateral pleural parenchymal scarring is identified. No suspicious pulmonary nodule or mass identified. No pleural effusion or pneumothorax.     Upper abdomen: Hepatic steatosis.     Chest wall/MSK: Exaggerated kyphotic curvature of the thoracolumbar spine is identified. Degenerative changes of the thoracolumbar spine are noted. No acute fractures or dislocations identified the visualized osseous  structures.      CONCLUSION: 1. No evidence of pneumothorax or other acute cardiopulmonary process.  2. Severe confluent centrilobular emphysema involving the bilateral upper lobes.  3. Hepatic steatosis suggested       Assessment & Plan:  COPD with asthma ([place]) It appears that her breathing is benefited and her functional capacity has benefited from the change to Anoro from [place].  She has noticed some increased throat irritation, globus sensation and also some yellow mucus production.  Is difficult to say whether this is due to the absence of an ICS or whether this just reflects an increase in her allergic rhinitis since it is now the spring allergy season.  Because she has benefited in otherwise from the Anoro I would like to continue it if at all possible.  I will try to treat her allergic rhinitis more aggressively, see if this helps with the mucus issue.  It may be that we could change her to combination therapy that includes an ICS like Trelegy at some point in the future if there is an indication to do so.  She notes that she is going to lose her insurance next year and will have to change to some sort of high deductible product so we may end up needing to change her bronchodilators at that time due to cost.  We discussed briefly the potential indication for lung volume reduction surgery.  Her CT scan of the chest is not available for me to review but I do have the report and it states that she has apical predominant emphysematous change.  I will get a copy of the CT and then we will review it together, decide whether she would benefit from referral to thoracic surgery.  We will obtain a copy of your CT chest from [facility].  We may decide to revisit referral for lung volume reduction surgery.  Please continue your Anoro as you are taking it.  Keep albuterol available to use if needed for chest tightness, wheeze, shortness of breath Continue loratadine daily Start fluticasone nasal spray, 2  sprays each nostril daily Follow with Dr Lamonte Sakai in 2 months or sooner if you have any problems.  Allergic rhinitis Continue loratadine daily Start fluticasone nasal spray, 2 sprays each nostril daily  Baltazar Apo, MD, PhD 01/26/2018, 4:02 PM [facility] [phone] or if no answer [phone]

## 2018-01-26 NOTE — Assessment & Plan Note (Signed)
It appears that her breathing is benefited and her functional capacity has benefited from the change to Anoro from [place].  She has noticed some increased throat irritation, globus sensation and also some yellow mucus production.  Is difficult to say whether this is due to the absence of an ICS or whether this just reflects an increase in her allergic rhinitis since it is now the spring allergy season.  Because she has benefited in otherwise from the Anoro I would like to continue it if at all possible.  I will try to treat her allergic rhinitis more aggressively, see if this helps with the mucus issue.  It may be that we could change her to combination therapy that includes an ICS like Trelegy at some point in the future if there is an indication to do so.  She notes that she is going to lose her insurance next year and will have to change to some sort of high deductible product so we may end up needing to change her bronchodilators at that time due to cost.  We discussed briefly the potential indication for lung volume reduction surgery.  Her CT scan of the chest is not available for me to review but I do have the report and it states that she has apical predominant emphysematous change.  I will get a copy of the CT and then we will review it together, decide whether she would benefit from referral to thoracic surgery.  We will obtain a copy of your CT chest from [facility].  We may decide to revisit referral for lung volume reduction surgery.  Please continue your Anoro as you are taking it.  Keep albuterol available to use if needed for chest tightness, wheeze, shortness of breath Continue loratadine daily Start fluticasone nasal spray, 2 sprays each nostril daily Follow with Dr Lamonte Sakai in 2 months or sooner if you have any problems.

## 2018-03-14 ENCOUNTER — Other Ambulatory Visit: Payer: Self-pay | Admitting: Emergency Medicine

## 2018-03-21 ENCOUNTER — Ambulatory Visit: Payer: PRIVATE HEALTH INSURANCE | Admitting: Family Medicine

## 2018-03-21 ENCOUNTER — Encounter: Payer: Self-pay | Admitting: Family Medicine

## 2018-03-21 VITALS — BP 129/87 | HR 79 | Temp 98.5°F | Ht 63.0 in | Wt 163.0 lb

## 2018-03-21 DIAGNOSIS — J441 Chronic obstructive pulmonary disease with (acute) exacerbation: Secondary | ICD-10-CM

## 2018-03-21 MED ORDER — AZITHROMYCIN 250 MG PO TABS
ORAL_TABLET | ORAL | 0 refills | Status: DC
Start: 2018-03-21 — End: 2018-04-03

## 2018-03-21 MED ORDER — PREDNISONE 20 MG PO TABS: ORAL_TABLET | ORAL | 0 refills | Status: DC

## 2018-03-21 NOTE — Progress Notes (Signed)
BP 129/87   Pulse 79   Temp 98.5 F (36.9 C) (Oral)   Ht 5\' 3"  (1.6 m)   Wt 163 lb (73.9 kg)   BMI 28.87 kg/m    Subjective:    Patient ID: Stacy [name], female    DOB: 12-17-1955, 62 y.o.   MRN: [id number]  HPI: Stacy [name] is a 62 y.o. female presenting on 03/21/2018 for Cough, worse at night time, coughing up green mucus, has had (found out that she had emphysema about 6 months ago; several drug allergies - normally takes Zpak, Augmentin or Amoxicillin)   HPI Cough and congestion and mucus  Patient has been having cough and congestion and green mucus that is been going on for the past week.  She says she has had a similar episode 6 months ago when her COPD flared up and she needed treatment for it and she feels like this is very similar.  She denies any fevers or chills or wheezing to this point yet but she feels like she is on the verge of developing that.  She has been using her anoro but has not needed her rescue inhaler to this point.  She just wanted to get in here before it got into her chest more than it is currently.  Says her cough is been nonproductive at this point but it has been very frequent and keeps her up at night sometimes.  Relevant past medical, surgical, family and social history reviewed and updated as indicated. Interim medical history since our last visit reviewed. Allergies and medications reviewed and updated.  Review of Systems  Constitutional: Negative for chills and fever.  HENT: Positive for congestion, postnasal drip, rhinorrhea, sinus pressure, sneezing and sore throat. Negative for ear discharge and ear pain.   Eyes: Negative for pain, redness and visual disturbance.  Respiratory: Positive for cough. Negative for chest tightness, shortness of breath and wheezing.   Cardiovascular: Negative for chest pain and leg swelling.  Genitourinary: Negative for difficulty urinating and dysuria.  Musculoskeletal: Negative for back pain and gait problem.    Skin: Negative for rash.  Neurological: Negative for light-headedness and headaches.  Psychiatric/Behavioral: Negative for agitation and behavioral problems.  All other systems reviewed and are negative.   Per HPI unless specifically indicated above   Allergies as of 03/21/2018      Reactions   Avelox [moxifloxacin Hcl In Nacl] Hives   Ciprofloxacin    REACTION: Rash   Livalo [pitavastatin]    Moxifloxacin    REACTION: RASH   Bactrim [sulfamethoxazole-trimethoprim] Rash   Statins Other (See Comments)   myalgias   Sulfamethoxazole-trimethoprim Rash   REACTION: Rash      Medication List        Accurate as of 03/21/18  6:49 PM. Always use your most recent med list.          albuterol 108 (90 Base) MCG/ACT inhaler Commonly known as:  PROVENTIL HFA;VENTOLIN HFA TAKE 2 PUFFS BY MOUTH EVERY 6 HOURS AS NEEDED FOR WHEEZE OR SHORTNESS OF BREATH   ANORO ELLIPTA 62.5-25 MCG/INH Aepb Generic drug:  umeclidinium-vilanterol TAKE 1 PUFF BY MOUTH EVERY DAY   azithromycin 250 MG tablet Commonly known as:  ZITHROMAX Take 2 the first day and then one each day after.   benzonatate 200 MG capsule Commonly known as:  TESSALON Take 1 capsule (200 mg total) by mouth 2 (two) times daily as needed for cough.   fluticasone 50 MCG/ACT nasal spray Commonly known  as:  FLONASE Place 2 sprays into both nostrils daily.   levothyroxine 88 MCG tablet Commonly known as:  SYNTHROID, LEVOTHROID TAKE 1 TABLET BY MOUTH EVERY DAY   loratadine 10 MG tablet Commonly known as:  CLARITIN Take 10 mg by mouth daily.   losartan 25 MG tablet Commonly known as:  COZAAR Take 25 mg by mouth daily.   meloxicam 15 MG tablet Commonly known as:  MOBIC Take 0.5-1 tablets (7.5-15 mg total) by mouth daily as needed for pain (low back pain).   predniSONE 20 MG tablet Commonly known as:  DELTASONE 2 po at same time daily for 5 days   PROBIOTIC PO Take 1 tablet by mouth daily as needed (digestion).    ZEGERID OTC PO Take by mouth.          Objective:    BP 129/87   Pulse 79   Temp 98.5 F (36.9 C) (Oral)   Ht 5\' 3"  (1.6 m)   Wt 163 lb (73.9 kg)   BMI 28.87 kg/m   Wt Readings from Last 3 Encounters:  03/21/18 163 lb (73.9 kg)  01/26/18 164 lb 9.6 oz (74.7 kg)  01/23/18 165 lb (74.8 kg)    Physical Exam  Constitutional: She is oriented to person, place, and time. She appears well-developed and well-nourished. No distress.  HENT:  Right Ear: Tympanic membrane, external ear and ear canal normal.  Left Ear: Tympanic membrane, external ear and ear canal normal.  Nose: Mucosal edema and rhinorrhea present. No epistaxis. Right sinus exhibits no maxillary sinus tenderness and no frontal sinus tenderness. Left sinus exhibits no maxillary sinus tenderness and no frontal sinus tenderness.  Mouth/Throat: Uvula is midline and mucous membranes are normal. Posterior oropharyngeal edema and posterior oropharyngeal erythema present. No oropharyngeal exudate or tonsillar abscesses.  Eyes: Conjunctivae are normal.  Neck: Neck supple.  Cardiovascular: Normal rate, regular rhythm, normal heart sounds and intact distal pulses.  No murmur heard. Pulmonary/Chest: Effort normal and breath sounds normal. No respiratory distress. She has no wheezes.  Neurological: She is alert and oriented to person, place, and time. Coordination normal.  Skin: Skin is warm and dry. No rash noted. She is not diaphoretic.  Psychiatric: She has a normal mood and affect. Her behavior is normal.  Vitals reviewed.       Assessment & Plan:   Problem List Items Addressed This Visit    None    Visit Diagnoses    COPD exacerbation ([place])    -  Primary   Relevant Medications   predniSONE (DELTASONE) 20 MG tablet   azithromycin (ZITHROMAX) 250 MG tablet       Follow up plan: Return if symptoms worsen or fail to improve.  Counseling provided for all of the vaccine components No orders of the defined types were  placed in this encounter.   Caryl Pina, MD [facility] 03/21/2018, 6:49 PM

## 2018-03-22 ENCOUNTER — Telehealth: Payer: Self-pay | Admitting: Family Medicine

## 2018-03-22 NOTE — Telephone Encounter (Signed)
Pt was told yesterday to only take 20mg  daily for 3 days but the sig says take 40mg  daily for 5 days. Which is correct?

## 2018-03-22 NOTE — Telephone Encounter (Signed)
Pt aware.

## 2018-03-22 NOTE — Telephone Encounter (Signed)
Sorry yes take the 20mg  for 3 days, it just got sent as the standard, I forgot to change it when I sent it

## 2018-03-26 ENCOUNTER — Other Ambulatory Visit: Payer: Self-pay | Admitting: Family Medicine

## 2018-03-26 DIAGNOSIS — I10 Essential (primary) hypertension: Secondary | ICD-10-CM

## 2018-03-27 ENCOUNTER — Other Ambulatory Visit: Payer: Self-pay | Admitting: Family Medicine

## 2018-03-27 DIAGNOSIS — I10 Essential (primary) hypertension: Secondary | ICD-10-CM

## 2018-04-03 ENCOUNTER — Ambulatory Visit: Payer: 59 | Admitting: Emergency Medicine

## 2018-04-03 ENCOUNTER — Encounter: Payer: Self-pay | Admitting: Emergency Medicine

## 2018-04-03 DIAGNOSIS — J439 Emphysema, unspecified: Secondary | ICD-10-CM

## 2018-04-03 DIAGNOSIS — J449 Chronic obstructive pulmonary disease, unspecified: Secondary | ICD-10-CM | POA: Diagnosis not present

## 2018-04-03 NOTE — Progress Notes (Signed)
Subjective:    Patient ID: Stacy [name], female    DOB: 1956/10/01, 62 y.o.   MRN: [id number]  Shortness of Breath  Associated symptoms include headaches, leg swelling and wheezing.   62 year-old former smoker (40+ pack years), history of hypertension, hyperlipidemia, hypothyroidism, irritable bowel syndrome.  There is a history of COPD vs asthma that was made about 2007 clinically and by spirometry 2008.  She is referred today for evaluation of chronic cough of 2 months duration, exertional dyspnea. 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 In retrospect she may have had some dyspnea even before that. She has increased wheeze. The cough is prod of thick white or frothy.  She hears wheeze at night. The warehouse exposure is now over.   ROV 09/28/17 --this is a follow-up visit for patient with a history of tobacco use, significant occupational dust exposure, and some evidence for hyperinflation versus bullous disease by imaging.  She returns today for further evaluation and alpha-1 antitrypsin genotype was MM, level 116 mg/dL. Her cough is better than last visit. Still coughs some.  She doesn't take singulair reliably.  She has exertional dyspnea and dyspnea especially with spells of coughing.  Has some clear mucus that she needs to cough up every day.  She underwent pulmonary function testing today that I have personally reviewed.  This shows very severe obstruction with a positive bronchodilator response, severe hyperinflation of her lung volumes, decreased diffusion capacity with correction to the normal range when adjusted for alveolar volume.  She reports that she has been on Spiriva before had to stop this due to myalgias.  She is also been on Asmanex in the past but this was too costly to continue.  ROV 12/28/17 --62 year old woman who follows up today for COPD  with asthmatic features.  Also has a history of allergic rhinitis.  At her last visit in December 2018 we started her on Breo. She is tolerating. She continues to have exertional dyspnea but a bit better than last time. Still limited. Her cough is better. She has some R shoulder discomfort that comes and goes, she compares it to an episode of subcutaneous air in her shoulder that she had when she had a c-section many years ago. She was just treated for green nasal congestion with abx.   ROV 01/26/18 --follow-up visit today for COPD with a bronchodilator response, asthmatic features.  She also has allergic rhinitis, chronic cough.  She has a chest x-ray that suggested some apical emphysematous change and hyperinflation.  This prompted a CT scan of the chest that was done at [facility]/05/18.  I have the report available but I cannot see the images.  They made note of severe confluent centrilobular emphysema in the bilateral upper lobes.  There are no pulmonary nodules, no interstitial lung disease. We changed her to Anoro from [facility] > she feels that this change helped her breathing but that she is having more mucous production. Difficult to say whether she is missing the ICS vs having more PND since its allergy season. She wants to go back to work, but still feels too limited.   ROV 04/03/18 --Stacy [name] has a history of COPD with a bronchodilator response and asthmatic features.  She also has chronic cough.  Both of these are influenced by her allergic rhinitis.  She has  central lobular emphysematous change by CT scan.  She was recently treated for an mild flare with increased cough, plugs, dyspnea, rx with azithro + pred. She is still having persistent cough with thick mucous, no longer green. She stopped flonase a month ago. She is on loratadine, uses zegerid prn but notes refractory GERD. Her breathing is improved. She is on Anoro, feels that she benefits.   Review of Systems  HENT: Positive for congestion,  postnasal drip and sinus pressure.   Respiratory: Positive for cough, chest tightness, shortness of breath and wheezing.   Cardiovascular: Positive for palpitations and leg swelling.  Allergic/Immunologic: Negative.   Neurological: Positive for headaches.  Psychiatric/Behavioral: The patient is nervous/anxious.     Past Medical History:  Diagnosis Date  . Asthma   . COPD, mild ([place])    Spirometerey 3/08  . Fatty liver disease, nonalcoholic   . GERD (gastroesophageal reflux disease)   . Hyperlipidemia   . Hypertension   . Hypothyroid Since 2003  . IBS (irritable bowel syndrome)   . Perimenopausal      Family History  Problem Relation Age of Onset  . Hypertension Father   . Hyperlipidemia Father   . Heart disease Father        Died at age 42 of MI  . Hypothyroidism Father   . Raynaud syndrome Sister   . Rheum arthritis Sister   . Thyroid disease Sister   . CAD Brother        has 4 stents  . Rheum arthritis Brother   . Heart disease Sister 41       h/o MI @age  4  . Thyroid disease Sister   . Breast cancer Neg Hx   . Ovarian cancer Neg Hx   . Prostate cancer Neg Hx   . 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  No other occupational exposure.  Has lived in [place] and [place] No military   Allergies  Allergen Reactions  . Avelox [Moxifloxacin Hcl In Nacl] Hives  . Ciprofloxacin     REACTION: Rash  . Livalo [Pitavastatin]   . Moxifloxacin     REACTION: RASH  . Bactrim [Sulfamethoxazole-Trimethoprim] Rash  . Statins Other (See Comments)    myalgias  . Sulfamethoxazole-Trimethoprim Rash    REACTION: Rash     Outpatient Medications Prior to Visit  Medication Sig Dispense Refill  . albuterol (PROVENTIL HFA;VENTOLIN HFA) 108 (90 Base) MCG/ACT inhaler TAKE 2 PUFFS BY MOUTH EVERY 6 HOURS AS NEEDED FOR WHEEZE OR SHORTNESS OF BREATH 25.5 Inhaler 1  . ANORO ELLIPTA 62.5-25 MCG/INH AEPB TAKE 1 PUFF BY MOUTH EVERY DAY 60 each 5  . benzonatate  (TESSALON) 200 MG capsule Take 1 capsule (200 mg total) by mouth 2 (two) times daily as needed for cough. 20 capsule 0  . fluticasone (FLONASE) 50 MCG/ACT nasal spray Place 2 sprays into both nostrils daily. 16 g 2  . levothyroxine (SYNTHROID, LEVOTHROID) 88 MCG tablet TAKE 1 TABLET BY MOUTH EVERY DAY 90 tablet 1  . loratadine (CLARITIN) 10 MG tablet Take 10 mg by mouth daily.    Marland Kitchen losartan (COZAAR) 25 MG tablet Take 25 mg by mouth daily.    . meloxicam (MOBIC) 15 MG tablet Take 0.5-1 tablets (7.5-15 mg total) by mouth daily as needed for pain (low back pain). 30 tablet 1  . Omeprazole-Sodium Bicarbonate (ZEGERID OTC PO) Take by mouth.    . Probiotic Product (PROBIOTIC PO) Take  1 tablet by mouth daily as needed (digestion).    Marland Kitchen azithromycin (ZITHROMAX) 250 MG tablet Take 2 the first day and then one each day after. 6 tablet 0  . losartan (COZAAR) 25 MG tablet TAKE 1 TABLET BY MOUTH EVERY DAY 90 tablet 0  . predniSONE (DELTASONE) 20 MG tablet 2 po at same time daily for 5 days 3 tablet 0   No facility-administered medications prior to visit.         Objective:   Physical Exam Vitals:   04/03/18 1008  BP: 126/82  Pulse: 82  SpO2: 96%  Weight: 163 lb (73.9 kg)  Height: 5\' 3"  (1.6 m)   Gen: Pleasant, well-nourished, in no distress,  normal affect  ENT: No lesions,  mouth clear,  oropharynx clear, no postnasal drip  Neck: No JVD, no stridor  Lungs: No use of accessory muscles, clear without rales or rhonchi  Cardiovascular: RRR, heart sounds normal, no murmur or gallops, no peripheral edema  Musculoskeletal: No deformities, no cyanosis or clubbing  Neuro: alert, non focal  Skin: Warm, no lesions or rash     CT CHEST WO CONTRAST, 01/05/2018 3:38 PM  INDICATION:ped, pneumothorax, known, xray done, blebs or bulbae suspected \ \ J43.9 Emphysematous bleb (HCC)  COMPARISON: None available  FINDINGS:  Thoracic inlet/central airways: Thyroid normal. Airway patent.       Mediastinum/hila/axilla: No adenopathy.     Heart/vessels: Normal heart size. No pericardial effusion. Aorta normal in caliber. Mild coronary arterial calcifications. Lungs/pleura: Severe confluent centrilobular emphysema is identified probably involving the bilateral upper lobes. Advanced obstructive characteristics are present. Bilateral pleural parenchymal scarring is identified. No suspicious pulmonary nodule or mass identified. No pleural effusion or pneumothorax.     Upper abdomen: Hepatic steatosis.     Chest wall/MSK: Exaggerated kyphotic curvature of the thoracolumbar spine is identified. Degenerative changes of the thoracolumbar spine are noted. No acute fractures or dislocations identified the visualized osseous structures.      CONCLUSION: 1. No evidence of pneumothorax or other acute cardiopulmonary process.  2. Severe confluent centrilobular emphysema involving the bilateral upper lobes.  3. Hepatic steatosis suggested       Assessment & Plan:  COPD with asthma ([place]) Recent flare but now improved.  She does not have any residual wheezing on exam today and I think the risk-benefit favors deferring any further prednisone.  We will continue Anoro.  I talked to her about the factors that influence cough and flaring including GERD and allergic rhinitis.  We also discussed the fact that she has emphysematous change on a CT scan from [facility].  We will plan to repeat the scan assess for focal bullous disease before making any kind referral for consideration of lung volume reduction surgery.   Please continue your Anoro once a day Take albuterol 2 puffs up to every 4 hours if needed for shortness of breath.  Please continue loratadine daily Try taking your zegerid daily for 2 weeks to see if this helps your reflux, reduces your cough.  We will plan to repeat your CT scan of the chest in February 2020 without contrast to follow centrilobular emphysema Follow with Dr Lamonte Sakai in 6  months or sooner if you have any problems  Baltazar Apo, MD, PhD 04/03/2018, 1:21 PM  Pulmonary and Critical Care [phone] or if no answer [phone]

## 2018-04-03 NOTE — Assessment & Plan Note (Signed)
Recent flare but now improved.  She does not have any residual wheezing on exam today and I think the risk-benefit favors deferring any further prednisone.  We will continue Anoro.  I talked to her about the factors that influence cough and flaring including GERD and allergic rhinitis.  We also discussed the fact that she has emphysematous change on a CT scan from [facility].  We will plan to repeat the scan assess for focal bullous disease before making any kind referral for consideration of lung volume reduction surgery.   Please continue your Anoro once a day Take albuterol 2 puffs up to every 4 hours if needed for shortness of breath.  Please continue loratadine daily Try taking your zegerid daily for 2 weeks to see if this helps your reflux, reduces your cough.  We will plan to repeat your CT scan of the chest in February 2020 without contrast to follow centrilobular emphysema Follow with Dr Lamonte Sakai in 6 months or sooner if you have any problems

## 2018-04-03 NOTE — Patient Instructions (Addendum)
Please continue your Anoro once a day Take albuterol 2 puffs up to every 4 hours if needed for shortness of breath.  Please continue loratadine daily Try taking your zegerid daily for 2 weeks to see if this helps your reflux, reduces your cough.  We will plan to repeat your CT scan of the chest in February 2020 without contrast to follow centrilobular emphysema Follow with Dr Lamonte Sakai in 6 months or sooner if you have any problems

## 2018-05-22 ENCOUNTER — Encounter: Payer: Self-pay | Admitting: Family Medicine

## 2018-05-22 ENCOUNTER — Ambulatory Visit: Payer: PRIVATE HEALTH INSURANCE | Admitting: Family Medicine

## 2018-05-22 VITALS — BP 135/85 | HR 81 | Temp 98.0°F | Ht 63.0 in | Wt 165.0 lb

## 2018-05-22 DIAGNOSIS — E039 Hypothyroidism, unspecified: Secondary | ICD-10-CM

## 2018-05-22 DIAGNOSIS — F329 Major depressive disorder, single episode, unspecified: Secondary | ICD-10-CM

## 2018-05-22 DIAGNOSIS — G4730 Sleep apnea, unspecified: Secondary | ICD-10-CM

## 2018-05-22 DIAGNOSIS — E118 Type 2 diabetes mellitus with unspecified complications: Secondary | ICD-10-CM

## 2018-05-22 DIAGNOSIS — R739 Hyperglycemia, unspecified: Secondary | ICD-10-CM

## 2018-05-22 HISTORY — DX: Type 2 diabetes mellitus with unspecified complications: E11.8

## 2018-05-22 LAB — BAYER DCA HB A1C WAIVED: HB A1C (BAYER DCA - WAIVED): 7.1 % — ABNORMAL HIGH (ref <7.0)

## 2018-05-22 MED ORDER — FLUOXETINE HCL 10 MG PO CAPS: 10.0000 mg | ORAL_CAPSULE | Freq: Every day | ORAL | 1 refills | Status: DC

## 2018-05-22 NOTE — Progress Notes (Signed)
Subjective: CC: needs A1c/ depressions PCP: [name], DO AOZ:HYQMV Stacy [name] is Stacy 62 y.o. female presenting to clinic today for:  1. Elevated BGs Patient reports that she has been feeling poorly over the last couple of months.  She notes that her husband, who is an EMT, checked her blood sugar with an old monitor and it read "high".  She notes that when she was with Stacy [name], she was on metformin and had A1c checked regularly.  Is unclear as to whether she had type 2 diabetes or was prediabetic.  Her medical records denotes "glucose interolerance". She reports fatigue and depressive symptoms.  2.  Depression Patient reports feeling overwhelmed by her medical problems, particularly her breathing.  She notes that she had Stacy CAT scan recently and that there is talk about potentially having surgical resection of her lungs.  She worries that symptoms will continue to worsen and that she will continue to watch "other people live their lives normally".  She also reports concern with regards to needing to go on to disability and that her [employer] will be running out in the next 6 months.  She was actually instructed by her cardiologist to consider sleep study given her reports of apneic spells during sleep.  She was unfortunately unable to afford that study during that time but is willing to reconsider this now.  She notes that she was previously treated for depression with Prozac and did okay with this.  She self titrated off of the medication because she had been asymptomatic for Stacy while.  She reports frequent crying spells, poor energy, and feeling overwhelmed.  3.  Hypothyroidism Patient reports compliance with thyroid medication.  She would like to have this rechecked today.  Low energy as above.   ROS: Per HPI  Allergies  Allergen Reactions  . Avelox [Moxifloxacin Hcl In Nacl] Hives  . Ciprofloxacin     REACTION: Rash  . Livalo [Pitavastatin]   . Moxifloxacin     REACTION:  RASH  . Bactrim [Sulfamethoxazole-Trimethoprim] Rash  . Statins Other (See Comments)    myalgias  . Sulfamethoxazole-Trimethoprim Rash    REACTION: Rash   Past Medical History:  Diagnosis Date  . Asthma   . COPD, mild ([place])    Spirometerey 3/08  . Fatty liver disease, nonalcoholic   . GERD (gastroesophageal reflux disease)   . Hyperlipidemia   . Hypertension   . Hypothyroid Since 2003  . IBS (irritable bowel syndrome)   . Perimenopausal     Current Outpatient Medications:  .  albuterol (PROVENTIL HFA;VENTOLIN HFA) 108 (90 Base) MCG/ACT inhaler, TAKE 2 PUFFS BY MOUTH EVERY 6 HOURS AS NEEDED FOR WHEEZE OR SHORTNESS OF BREATH, Disp: 25.5 Inhaler, Rfl: 1 .  ANORO ELLIPTA 62.5-25 MCG/INH AEPB, TAKE 1 PUFF BY MOUTH EVERY DAY, Disp: 60 each, Rfl: 5 .  benzonatate (TESSALON) 200 MG capsule, Take 1 capsule (200 mg total) by mouth 2 (two) times daily as needed for cough., Disp: 20 capsule, Rfl: 0 .  fluticasone (FLONASE) 50 MCG/ACT nasal spray, Place 2 sprays into both nostrils daily., Disp: 16 g, Rfl: 2 .  levothyroxine (SYNTHROID, LEVOTHROID) 88 MCG tablet, TAKE 1 TABLET BY MOUTH EVERY DAY, Disp: 90 tablet, Rfl: 1 .  loratadine (CLARITIN) 10 MG tablet, Take 10 mg by mouth daily., Disp: , Rfl:  .  losartan (COZAAR) 25 MG tablet, Take 25 mg by mouth daily., Disp: , Rfl:  .  meloxicam (MOBIC) 15 MG tablet, Take 0.5-1  tablets (7.5-15 mg total) by mouth daily as needed for pain (low back pain)., Disp: 30 tablet, Rfl: 1 .  Omeprazole-Sodium Bicarbonate (ZEGERID OTC PO), Take by mouth., Disp: , Rfl:  .  Probiotic Product (PROBIOTIC PO), Take 1 tablet by mouth daily as needed (digestion)., Disp: , Rfl:  Social History   Socioeconomic History  . Marital status: Divorced    Spouse name: Not on file  . Number of children: 2  . Years of education: Not on file  . Highest education level: Not on file  Occupational History  . Occupation: Works in Financial controller  . Financial resource  strain: Not on file  . Food insecurity:    Worry: Not on file    Inability: Not on file  . Transportation needs:    Medical: Not on file    Non-medical: Not on file  Tobacco Use  . Smoking status: Former Smoker    Packs/day: 0.50    Years: 30.00    Pack years: 15.00    Types: Cigarettes    Last attempt to quit: 10/25/2007    Years since quitting: 10.5  . Smokeless tobacco: Never Used  Substance and Sexual Activity  . Alcohol use: Yes    Alcohol/week: 0.0 oz    Comment: Occasional-once Stacy year  . Drug use: No  . Sexual activity: Yes    Birth control/protection: Surgical  Lifestyle  . Physical activity:    Days per week: Not on file    Minutes per session: Not on file  . Stress: Not on file  Relationships  . Social connections:    Talks on phone: Not on file    Gets together: Not on file    Attends religious service: Not on file    Active member of club or organization: Not on file    Attends meetings of clubs or organizations: Not on file    Relationship status: Not on file  . Intimate partner violence:    Fear of current or ex partner: Not on file    Emotionally abused: Not on file    Physically abused: Not on file    Forced sexual activity: Not on file  Other Topics Concern  . Not on file  Social History Narrative   Remarried.    Previous occupation: Customer service manager.  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      Moved from [facility] in 2007   2 children, has stepchildren and grandchildren   Does not exercise   Family History  Problem Relation Age of Onset  . Hypertension Father   . Hyperlipidemia Father   . Heart disease Father        Died at age 19 of MI  . Hypothyroidism Father   . Raynaud syndrome Sister   . Rheum arthritis Sister   . Thyroid disease Sister   . CAD Brother        has 4 stents  . Rheum arthritis Brother   . Heart disease Sister 52       h/o MI @age  70  . Thyroid disease Sister   . Breast cancer Neg Hx   . Ovarian cancer Neg Hx   . Prostate  cancer Neg Hx   . Colon cancer Neg Hx     Objective: Office vital signs reviewed. BP 135/85   Pulse 81   Temp 98 F (36.7 C) (Oral)   Ht 5\' 3"  (1.6 m)   Wt 165 lb (74.8 kg)  BMI 29.23 kg/m   Physical Examination:  General: Awake, alert, well nourished, stressed appearing. HEENT: Normal, MMM; no exophthalmos.  Sclera white.  No goiter. Cardio: regular rate and rhythm, S1S2 heard, no murmurs appreciated Pulm: clear to auscultation bilaterally, no wheezes, rhonchi or rales; normal work of breathing on room air Extremities: warm, well perfused, No edema, cyanosis or clubbing; +2 pulses bilaterally MSK: normal gait and normal station Psych: Patient intermittently tearful.  She appears to be stress.  Does not appear to be responding to internal stimuli.  She is pleasant. Neuro: No resting tremor noted. Depression screen [facility] 2/9 05/22/2018 03/21/2018 01/23/2018  Decreased Interest 0 0 1  Down, Depressed, Hopeless 2 0 1  PHQ - 2 Score 2 0 2  Altered sleeping 3 - 2  Tired, decreased energy 3 - 2  Change in appetite 2 - 2  Feeling bad or failure about yourself  2 - 1  Trouble concentrating 1 - 1  Moving slowly or fidgety/restless 1 - 0  Suicidal thoughts 0 - 0  PHQ-9 Score 14 - 10  Difficult doing work/chores - - -    Assessment/ Plan: 62 y.o. female   1. Elevated blood sugar A1c today was 7.1.  I suspect that she has type 2 diabetes but has been diet controlled.  We discussed that she can work on diet modification for the next couple of months.  If persistently above 7, we will consider starting back on metformin.  We will plan to recheck in 3 months.  We will also need to get diabetic eye exam, foot exam.  Currently on ARB.  May also need pneumococcal vaccine. - Bayer DCA Hb A1c Waived  2. Controlled type 2 diabetes mellitus with complication, without long-term current use of insulin ([place]) As above  3. Sleep apnea, unspecified type I have referred her back to sleep studies for  polysomnogram.  She certainly sounds like she is demonstrating signs of sleep apnea. - Ambulatory referral to Sleep Studies  4. Acquired hypothyroidism Check TSH. - TSH  5. Reactive depression I think that patient is certainly overwhelmed by her chronic medical problems.  We discussed options to treat her depression and will proceed with resuming use of Prozac.  Start at 10 mg daily.  May need to increase.  I have given her Stacy list of local psychiatrist and therapist.  I will also have the virtual behavioral health specialist reach out to her.  She will follow-up in the next 4 to 6 weeks for recheck.   Orders Placed This Encounter  Procedures  . TSH  . Ambulatory referral to Sleep Studies    Referral Priority:   Routine    Referral Type:   Consultation    Referral Reason:   Specialty Services Required    Number of Visits Requested:   1   Meds ordered this encounter  Medications  . FLUoxetine (PROZAC) 10 MG capsule    Sig: Take 1 capsule (10 mg total) by mouth daily.    Dispense:  30 capsule    Refill:  [place], [place] [phone]

## 2018-05-22 NOTE — Patient Instructions (Signed)
I placed a referral to the sleep specialist.  The symptoms you are describing sound like sleep apnea.  You likely will need a CPAP machine.  Use of NSAIDs (meloxicam, naproxen or ibuprofen) with antidepressants like Prozac can increase the risk of GI bleeds.  If you develop severe abdominal pain, blood in your stool, or vomiting seek medical attention.  Taking the medicine as directed and not missing any doses is one of the best things you can do to treat your depression.  Here are some things to keep in mind:  1) Side effects (stomach upset, some increased anxiety) may happen before you notice a benefit.  These side effects typically go away over time. 2) Changes to your dose of medicine or a change in medication all together is sometimes necessary 3) Most people need to be on medication at least 12 months 4) Many people will notice an improvement within two weeks but the full effect of the medication can take up to 4-6 weeks 5) Stopping the medication when you start feeling better often results in a return of symptoms 6) Never discontinue your medication without contacting a health care professional first.  Some medications require gradual discontinuation/ taper and can make you sick if you stop them abruptly.  If your symptoms worsen or you have thoughts of suicide/homicide, PLEASE SEEK IMMEDIATE MEDICAL ATTENTION.  You may always call:  National Suicide Hotline: 7097318163 [place]: 732-045-4705 Crisis Recovery in [place]: [phone]   These are available 24 hours a day, 7 days a week.  Your provider wants you to schedule an appointment with a Psychologist/Psychiatrist. The following list of offices requires the patient to call and make their own appointment, as there is information they need that only you can provide. Please feel free to choose form the following providers:  [facility] in [place]  [place]  [phone] [place], [place]  (Scheduled through [facility]) Must call and do an interview for appointment. Sees Children / Accepts Medicaid  [facility] [place]  [address] [place]    [place], [place]  [phone] [place], [place] for Autism but does not treat it Sees Children / Accepts Medicaid  [facility]    [phone] [address] [place], [place] Medication management, substance abuse, bipolar, grief, family, marriage, OCD, anxiety, PTSD Sees children / Accepts Medicaid  [facility]    [phone] [address], [place], [place] children / Accepts [facility]  [facility]  [phone] [address] [place], [place]   Dr Lorenza Evangelist     [phone] [address] [place], [place]  Sees ADD & ADHD for treatment Accepts Medicaid  [facility]  [phone] [place] Ho[place], [place] for Autism Accepts [facility]  [facility]  [phone] [place], [place]  Does Adult ADD evaluations Does not accept Medicaid  [facility]   [phone] [place], [place] children as young as 17 years old Accepts [facility]     [phone]    [place], [place] [address] Sees children Accepts Medicaid

## 2018-05-23 LAB — TSH: TSH: 2.86 u[IU]/mL (ref 0.450–4.500)

## 2018-05-24 ENCOUNTER — Telehealth: Payer: Self-pay

## 2018-05-24 NOTE — Telephone Encounter (Signed)
Patient reports that she was busy with company and was not able to speak.  Patient requests a call back next week.

## 2018-05-24 NOTE — Telephone Encounter (Signed)
[facility] - Left Msg 

## 2018-05-25 ENCOUNTER — Encounter: Payer: Self-pay | Admitting: Emergency Medicine

## 2018-05-25 ENCOUNTER — Telehealth: Payer: Self-pay | Admitting: Emergency Medicine

## 2018-05-25 NOTE — Telephone Encounter (Signed)
Disc picked up and placed in [place] folder. Lindsey aware. Will route to [place] to follow up.

## 2018-05-25 NOTE — Telephone Encounter (Signed)
Error

## 2018-05-28 ENCOUNTER — Ambulatory Visit (INDEPENDENT_AMBULATORY_CARE_PROVIDER_SITE_OTHER): Payer: 59 | Admitting: Neurology

## 2018-05-28 ENCOUNTER — Encounter: Payer: Self-pay | Admitting: Neurology

## 2018-05-28 VITALS — BP 130/78 | HR 77 | Ht 63.5 in | Wt 162.0 lb

## 2018-05-28 DIAGNOSIS — J432 Centrilobular emphysema: Secondary | ICD-10-CM

## 2018-05-28 DIAGNOSIS — G4730 Sleep apnea, unspecified: Secondary | ICD-10-CM

## 2018-05-28 DIAGNOSIS — G4700 Insomnia, unspecified: Secondary | ICD-10-CM | POA: Insufficient documentation

## 2018-05-28 DIAGNOSIS — R0683 Snoring: Secondary | ICD-10-CM

## 2018-05-28 DIAGNOSIS — F5104 Psychophysiologic insomnia: Secondary | ICD-10-CM | POA: Insufficient documentation

## 2018-05-28 DIAGNOSIS — E669 Obesity, unspecified: Secondary | ICD-10-CM

## 2018-05-28 DIAGNOSIS — R0602 Shortness of breath: Secondary | ICD-10-CM | POA: Insufficient documentation

## 2018-05-28 DIAGNOSIS — E66811 Obesity, class 1: Secondary | ICD-10-CM

## 2018-05-28 DIAGNOSIS — G4710 Hypersomnia, unspecified: Secondary | ICD-10-CM

## 2018-05-28 HISTORY — DX: Centrilobular emphysema: J43.2

## 2018-05-28 HISTORY — DX: Sleep apnea, unspecified: G47.30

## 2018-05-28 HISTORY — DX: Hypersomnia, unspecified: G47.10

## 2018-05-28 HISTORY — DX: Psychophysiologic insomnia: F51.04

## 2018-05-28 HISTORY — DX: Obesity, class 1: E66.811

## 2018-05-28 HISTORY — DX: Obesity, unspecified: E66.9

## 2018-05-28 HISTORY — DX: Insomnia, unspecified: G47.00

## 2018-05-28 HISTORY — DX: Snoring: R06.83

## 2018-05-28 NOTE — Progress Notes (Addendum)
SLEEP MEDICINE CLINIC   Provider:  Larey Seat, [place] D  Primary Care Physician:  [name], DO   Referring Provider: [name], DO     Chief Complaint  Patient presents with  . New Patient (Initial Visit)    pt alone,rm11. pt states that she is not sleeping well through the night. she has been told that she snores in sleep and has been told she gasp for air. she has also woke herself up gasping for air. never had a sleep study    HPI:  Stacy [name] is a 62 y.o. female patient , seen here as in a referral  from Dr. Lajuana Ripple for a sleep evaluation.    Chief complaint according to patient : [name] has shortness of breath, has insomnia and depression- in response to the awareness that she has a chronic incurable illness. Her husband, her mother and grandchildren all report that she snores and she has woken up by her own snoring. She carries a diagnosis of emphysema( smoked for 30 years, and quit 11 years ago), she attributes this to mold exposure at her last job. She was last week told that she is now diabetic- Hba1c was 7.1 - and that was another blow.    Sleep habits are as follows: She watches TV in her den, sometimes plays on line- and moves to the bedroom between 08-02-29 PM  Her bedroom is without TV but she sleeps with 2 little dogs- one is restless. Her bedroom is cool, and dark. Her husband has moved to another bed as he has had a total knee replacement in March 2019, and she sleeps better. She sleeps in a fetal position, on one thick and firm pillow.  Sometimes it takes her an hour- and one night out of 10 she has no sleep at all.  If she falls asleep by midnight, she has to go to urinate at 2, 4 and again 6 AM- and sometimes more frequently.  In a work day she rises at 6 AM but  wakes up at 5 AM when her husband gets up - overall average sleep time is 4-5 hours only. She can't nap in daytime - feels she needs it- can't go there. Surprisingly, she is  able to sleep in front of her TV but wakes within 15 minutes, choking, gasping in a recliner.    Sleep medical history and family sleep history: Mother sleeps 5 hours only, father died at 60, CAD, CHF. Siblings are affected by rheumatoid arthritis, her sister has a heart valve disease and Raynaud's. Brother had CAD.     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 First married at 43- year of HS graduation-  Re -married for 3 years to second husband. 30 years of  1ppd smoking cigarettes . Quit in 2008 , ETOH rare, Caffeine - 2 cups of coffee in AM, 2 cans of Pepsi. No iced tea.  Regular exerciser? : no    Review of Systems: Out of a complete 14 system review, the patient complains of only the following symptoms, and all other reviewed systems are negative.  " I am out of breath when I walk to my mail box"    Epworth score 9  , Fatigue severity score 51   , depression score 11/ 15   Social History   Socioeconomic History  . Marital status: Divorced    Spouse name: Not  on file  . Number of children: 2  . Years of education: Not on file  . Highest education level: Not on file  Occupational History  . Occupation: Works in Financial controller  . Financial resource strain: Not on file  . Food insecurity:    Worry: Not on file    Inability: Not on file  . Transportation needs:    Medical: Not on file    Non-medical: Not on file  Tobacco Use  . Smoking status: Former Smoker    Packs/day: 0.50    Years: 30.00    Pack years: 15.00    Types: Cigarettes    Last attempt to quit: 10/25/2007    Years since quitting: 10.5  . Smokeless tobacco: Never Used  Substance and Sexual Activity  . Alcohol use: Yes    Alcohol/week: 0.0 oz    Comment: Occasional-once a year  . Drug use: No  . Sexual activity: Yes    Birth control/protection: Surgical  Lifestyle  . Physical activity:    Days per week: Not on file    Minutes per session: Not on  file  . Stress: Not on file  Relationships  . Social connections:    Talks on phone: Not on file    Gets together: Not on file    Attends religious service: Not on file    Active member of club or organization: Not on file    Attends meetings of clubs or organizations: Not on file    Relationship status: Not on file  . Intimate partner violence:    Fear of current or ex partner: Not on file    Emotionally abused: Not on file    Physically abused: Not on file    Forced sexual activity: Not on file  Other Topics Concern  . Not on file  Social History Narrative   Remarried.    Previous occupation: Customer service manager.  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      Moved [place] [facility] in 2007   2 children, has stepchildren and grandchildren   Does not exercise    Family History  Problem Relation Age of Onset  . Hypertension Father   . Hyperlipidemia Father   . Heart disease Father        Died at age 18 of MI  . Hypothyroidism Father   . Raynaud syndrome Sister   . Rheum arthritis Sister   . Thyroid disease Sister   . CAD Brother        has 4 stents  . Rheum arthritis Brother   . Heart disease Sister 52       h/o MI @age  33  . Thyroid disease Sister   . Breast cancer Neg Hx   . Ovarian cancer Neg Hx   . Prostate cancer Neg Hx   . Colon cancer Neg Hx     Past Medical History:  Diagnosis Date  . Asthma   . COPD, mild ([place])    Spirometerey 3/08  . Fatty liver disease, nonalcoholic   . GERD (gastroesophageal reflux disease)   . Hyperlipidemia   . Hypertension   . Hypothyroid Since 2003  . IBS (irritable bowel syndrome)   . Perimenopausal     Past Surgical History:  Procedure Laterality Date  . ABLATION    . CARDIAC CATHETERIZATION N/A 06/12/2015   Procedure: Left Heart Cath and Coronary Angiography;  Surgeon: Burnell Blanks, MD;  Location: [facility];  Service: Cardiovascular;  Laterality: N/A;  . CESAREAN SECTION    . LTCS     x2  . SPIROMETRY   01/05/2007   FVC 68% predicted; FEV1 44% predicted; FEV1/FVC 63% predicted = moderate obstruction with low vital capacity possibly due to restriction  . TUBAL LIGATION      Current Outpatient Medications  Medication Sig Dispense Refill  . albuterol (PROVENTIL HFA;VENTOLIN HFA) 108 (90 Base) MCG/ACT inhaler TAKE 2 PUFFS BY MOUTH EVERY 6 HOURS AS NEEDED FOR WHEEZE OR SHORTNESS OF BREATH 25.5 Inhaler 1  . ANORO ELLIPTA 62.5-25 MCG/INH AEPB TAKE 1 PUFF BY MOUTH EVERY DAY 60 each 5  . FLUoxetine (PROZAC) 10 MG capsule Take 1 capsule (10 mg total) by mouth daily. 30 capsule 1  . levothyroxine (SYNTHROID, LEVOTHROID) 88 MCG tablet TAKE 1 TABLET BY MOUTH EVERY DAY 90 tablet 1  . loratadine (CLARITIN) 10 MG tablet Take 10 mg by mouth daily.    Marland Kitchen losartan (COZAAR) 25 MG tablet Take 25 mg by mouth daily.    . meloxicam (MOBIC) 15 MG tablet Take 0.5-1 tablets (7.5-15 mg total) by mouth daily as needed for pain (low back pain). 30 tablet 1  . Omeprazole-Sodium Bicarbonate (ZEGERID OTC PO) Take by mouth.    . benzonatate (TESSALON) 200 MG capsule Take 1 capsule (200 mg total) by mouth 2 (two) times daily as needed for cough. (Patient not taking: Reported on 05/28/2018) 20 capsule 0  . fluticasone (FLONASE) 50 MCG/ACT nasal spray Place 2 sprays into both nostrils daily. (Patient not taking: Reported on 05/28/2018) 16 g 2  . Probiotic Product (PROBIOTIC PO) Take 1 tablet by mouth daily as needed (digestion).     No current facility-administered medications for this visit.     Allergies as of 05/28/2018 - Review Complete 05/28/2018  Allergen Reaction Noted  . Avelox [moxifloxacin hcl in nacl] Hives 12/29/2013  . Ciprofloxacin  08/23/2009  . Livalo [pitavastatin]  03/11/2013  . Moxifloxacin  08/23/2009  . Bactrim [sulfamethoxazole-trimethoprim] Rash 12/29/2013  . Statins Other (See Comments) 03/11/2013  . Sulfamethoxazole-trimethoprim Rash 08/23/2009    CLINICAL DATA:  Right-sided mid low back pain.  Thoracic radiculopathy. Idiopathic scoliosis.  EXAM: MRI THORACIC SPINE WITHOUT CONTRAST  TECHNIQUE: Multiplanar, multisequence MR imaging was performed. No intravenous contrast was administered.  COMPARISON:  DG THORACIC SPINE 2V dated 12/31/2013  FINDINGS: Leftward curvature of the lumbar spine is centered at T9-10. Normal signal is present throughout thoracic spinal cord to the lowest imaged level, L1. Marrow signal, vertebral body heights, and alignment are normal. A shallow disc protrusion is present at T8-9 without significant stenosis. No other significant disc disease is present. The foramina are widely patent bilaterally.  Visualized lungs and paraspinous soft tissues are unremarkable.  IMPRESSION: 1. Leftward curvature of the thoracic spine is centered at T9-10. 2. Minimal shallow disc protrusion at T8-9 without significant stenosis. 3. No other focal or significant stenosis in the thoracic spine.   Electronically Signed   By: Lawrence Santiago M.D.   On: 01/08/2014 20:17  Vitals: BP 130/78   Pulse 77   Ht 5' 3.5" (1.613 m)   Wt 162 lb (73.5 kg)   BMI 28.25 kg/m  Last Weight:  Wt Readings from Last 1 Encounters:  05/28/18 162 lb (73.5 kg)   JQB:HALP mass index is 28.25 kg/m.     Last Height:   Ht Readings from Last 1 Encounters:  05/28/18 5' 3.5" (1.613 m)    Physical exam:  General: The patient is awake, alert  and appears not in acute distress. The patient is well groomed. Head: Normocephalic, atraumatic. Neck is supple. Mallampati 3- still has tonsils.  neck circumference: 14". Nasal airflow patent but with resistance. She has a nasal septum deviation, right nasion is narrow,  Retrognathia is not seen.  Cardiovascular:  Regular rate and rhythm, without  murmurs or carotid bruit, and without distended neck veins. Respiratory: Lungs are clear to auscultation. Skin:  Without evidence of edema, or rash Trunk: BMI is 28. 25. The patient's posture is  slightly stooped due to scoliosis.   Neurologic exam : The patient is awake and alert, oriented to place and time.   Memory subjective described as intact. Attention span & concentration ability appears normal.  Speech is fluent,  without dysarthria, dysphonia or aphasia.  Mood and affect are appropriate.  Cranial nerves: Pupils are equal and briskly reactive to light. Funduscopic exam without evidence of pallor or edema. Extraocular movements  in vertical and horizontal planes intact and without nystagmus. Visual fields by finger perimetry are intact. Hearing to finger rub intact.  Facial sensation intact to fine touch. Facial motor strength is symmetric and tongue and uvula move midline. Shoulder shrug was symmetrical.   Motor exam:  Normal tone, muscle bulk and symmetric strength in all extremities. Sensory:  Fine touch, pinprick and vibration were tested in all extremities. Proprioception tested in the upper extremities was normal. Coordination: Rapid alternating movements in the fingers/hands was normal. Finger-to-nose maneuver  normal without evidence of ataxia, dysmetria or tremor. Gait and station: Patient walks without assistive device and is able unassisted to climb up to the exam table. Strength within normal limits. Stance is stable and normal.  Romberg testing is negative. Deep tendon reflexes: in the  upper and lower extremities are symmetric and intact.  Assessment:  After physical and neurologic examination, review of laboratory studies,  Personal review of imaging studies, reports of other /same  Imaging studies, results of polysomnography and / or neurophysiology testing and pre-existing records as far as provided in visit., my assessment is :  1) Sleep evaluation for this patient with hypothyroidism.  Recently discovered diabetes, elevated body mass index, and the habit of snoring loudly according to her family.  She has woken herself from snoring and she may well have apnea,  but she also has an insomnia component that is clearly more psychologically induced.  She was just started on Prozac 10 mg by her primary care physician and this should help extending her time in sleep.  It likely also kept the sleep latency time.  I would like for the patient to have a home sleep test, and we will meet after the results are in.  A home sleep test does not tell me if the patient slept actually but he tells me how the breathing rhythm, oxygenation and heart rate were.   The patient was advised of the nature of the diagnosed disorder, the treatment options and the  risks for general health and wellness arising from not treating the condition.   I spent more than  45 minutes of face to face time with the patient.  Greater than 50% of time was spent in counseling and coordination of care. We have discussed the diagnosis and differential and I answered the patient's questions.    Plan:  Treatment plan and additional workup :ACS [employer] - HST .  RV after HST.   Larey Seat, MD 5/0/9326, 7:12 AM  Certified in Neurology by ABPN Certified in Sleep Medicine  by [facility] [address], [place], [place] [address]

## 2018-05-28 NOTE — Patient Instructions (Signed)

## 2018-05-30 NOTE — Telephone Encounter (Signed)
This disc has been placed in RB's review folder.

## 2018-05-31 NOTE — Telephone Encounter (Signed)
RB is out of the office today, will return on 06/01/2018 Routing to [place] to advise when he returns

## 2018-06-01 ENCOUNTER — Telehealth: Payer: Self-pay

## 2018-06-01 NOTE — Telephone Encounter (Signed)
[facility] - Left Msg 

## 2018-06-04 NOTE — Telephone Encounter (Signed)
Please let the patient know that I have reviewed her CT scan from March 2019.  I agree that she has upper lobe predominant emphysema with relative sparing at both bases.  This would make her a potential candidate for lung volume reduction surgery.  If she would like to discuss with me or if she would like to be referred for possible evaluation then I would be willing to do so.  Please ask her how she would like to proceed.  Thank you

## 2018-06-04 NOTE — Telephone Encounter (Signed)
pt's disc for review in RB review folder RB please advise once reviewed.

## 2018-06-05 NOTE — Telephone Encounter (Signed)
Thanks

## 2018-06-05 NOTE — Telephone Encounter (Signed)
Called and spoke with pt regarding RB recommendations  Pt is requesting a ov with RB first, then evaluation afterwards Scheduled ov 06/08/2018 at 11:30am with RB  Routing message to [place] for [place]

## 2018-06-08 ENCOUNTER — Ambulatory Visit (INDEPENDENT_AMBULATORY_CARE_PROVIDER_SITE_OTHER): Payer: 59 | Admitting: Emergency Medicine

## 2018-06-08 ENCOUNTER — Encounter: Payer: Self-pay | Admitting: Emergency Medicine

## 2018-06-08 VITALS — BP 112/68 | HR 66 | Ht 63.5 in | Wt 159.0 lb

## 2018-06-08 DIAGNOSIS — J301 Allergic rhinitis due to pollen: Secondary | ICD-10-CM

## 2018-06-08 DIAGNOSIS — J449 Chronic obstructive pulmonary disease, unspecified: Secondary | ICD-10-CM | POA: Diagnosis not present

## 2018-06-08 DIAGNOSIS — Z23 Encounter for immunization: Secondary | ICD-10-CM

## 2018-06-08 DIAGNOSIS — K219 Gastro-esophageal reflux disease without esophagitis: Secondary | ICD-10-CM | POA: Diagnosis not present

## 2018-06-08 DIAGNOSIS — J438 Other emphysema: Secondary | ICD-10-CM | POA: Diagnosis not present

## 2018-06-08 NOTE — Patient Instructions (Addendum)
We will refer you to interventional pulmonology at [facility], Dr.Whahidi, to discuss and consider endobronchial valve placement for regional emphysema.  He may recommend that you see a thoracic surgeon to also discuss the option of lung volume reduction surgery. Please continue your Anoro as you have been taking it. Please keep albuterol available to use 2 puffs if needed for shortness of breath, chest tightness, wheezing. You will receive the Prevnar-13 vaccine today. Remember to get the flu shot this fall. Continue your loratadine and Zegerid as you have been taking them.  Follow with Dr Lamonte Sakai in 4 months or sooner if you have any problems.

## 2018-06-08 NOTE — Assessment & Plan Note (Signed)
Continue loratadine 

## 2018-06-08 NOTE — Assessment & Plan Note (Signed)
Continue Zegerid

## 2018-06-08 NOTE — Progress Notes (Signed)
Subjective:    Patient ID: Stacy [name], female    DOB: 01/25/1956, 62 y.o.   MRN: [id number]  Shortness of Breath  Associated symptoms include headaches, leg swelling and wheezing.   62 year-old former smoker (40+ pack years), history of hypertension, hyperlipidemia, hypothyroidism, irritable bowel syndrome.  There is a history of COPD vs asthma that was made about 2007 clinically and by spirometry 2008.  She is referred today for evaluation of chronic cough of 2 months duration, exertional dyspnea. 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 In retrospect she may have had some dyspnea even before that. She has increased wheeze. The cough is prod of thick white or frothy.  She hears wheeze at night. The warehouse exposure is now over.    ROV 60/5/19 --follow-up visit today for COPD with a bronchodilator response, asthmatic features.  She also has allergic rhinitis, chronic cough.  She has a chest x-ray that suggested some apical emphysematous change and hyperinflation.  This prompted a CT scan of the chest that was done at [facility] 01/05/18.  I have the report available but I cannot see the images.  They made note of severe confluent centrilobular emphysema in the bilateral upper lobes.  There are no pulmonary nodules, no interstitial lung disease. We changed her to Anoro from [facility] > she feels that this change helped her breathing but that she is having more mucous production. Difficult to say whether she is missing the ICS vs having more PND since its allergy season. She wants to go back to work, but still feels too limited.   ROV 60/11/19 --Stacy [name] has a history of COPD with a bronchodilator response and asthmatic features.  She also has chronic cough.  Both of these are influenced by her allergic rhinitis.  She has central lobular emphysematous change by CT  scan.  She was recently treated for an mild flare with increased cough, plugs, dyspnea, rx with azithro + pred. She is still having persistent cough with thick mucous, no longer green. She stopped flonase a month ago. She is on loratadine, uses zegerid prn but notes refractory GERD. Her breathing is improved. She is on Anoro, feels that she benefits.   ROV 60/16/19 --follow-up visit for patient with a history of COPD with asthmatic features, bronchodilator response.  She also has allergic rhinitis, GERD and chronic cough.  She has been managed on Anoro.  I was able to review her CT scan of the chest that was done 01/05/2018.  I do agree that her centrilobular emphysema is definitely upper lobe predominant. She is interested in possible LVRS - at least to hear about it. I have also mentioned endobronchial valves to her. She is doing well, no active sx at this time. She is UTD pneumovax, needs Prevnar   Review of Systems  HENT: Positive for congestion, postnasal drip and sinus pressure.   Respiratory: Positive for cough, chest tightness, shortness of breath and wheezing.   Cardiovascular: Positive for palpitations and leg swelling.  Allergic/Immunologic: Negative.   Neurological: Positive for headaches.  Psychiatric/Behavioral: The patient is nervous/anxious.     Past Medical History:  Diagnosis Date  . Asthma   . COPD, mild ([place])    Spirometerey 3/08  . Fatty liver disease, nonalcoholic   . GERD (gastroesophageal reflux disease)   . Hyperlipidemia   .  Hypertension   . Hypothyroid Since 2003  . IBS (irritable bowel syndrome)   . Perimenopausal      Family History  Problem Relation Age of Onset  . Hypertension Father   . Hyperlipidemia Father   . Heart disease Father        Died at age 74 of MI  . Hypothyroidism Father   . Raynaud syndrome Sister   . Rheum arthritis Sister   . Thyroid disease Sister   . CAD Brother        has 4 stents  . Rheum arthritis Brother   . Heart disease  Sister 23       h/o MI @age  18  . Thyroid disease Sister   . Breast cancer Neg Hx   . Ovarian cancer Neg Hx   . Prostate cancer Neg Hx   . 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  No other occupational exposure.  Has lived in [place] and [place] No military   Allergies  Allergen Reactions  . Avelox [Moxifloxacin Hcl In Nacl] Hives  . Ciprofloxacin     REACTION: Rash  . Livalo [Pitavastatin]   . Moxifloxacin     REACTION: RASH  . Bactrim [Sulfamethoxazole-Trimethoprim] Rash  . Statins Other (See Comments)    myalgias  . Sulfamethoxazole-Trimethoprim Rash    REACTION: Rash     Outpatient Medications Prior to Visit  Medication Sig Dispense Refill  . albuterol (PROVENTIL HFA;VENTOLIN HFA) 108 (90 Base) MCG/ACT inhaler TAKE 2 PUFFS BY MOUTH EVERY 6 HOURS AS NEEDED FOR WHEEZE OR SHORTNESS OF BREATH 25.5 Inhaler 1  . ANORO ELLIPTA 62.5-25 MCG/INH AEPB TAKE 1 PUFF BY MOUTH EVERY DAY 60 each 5  . FLUoxetine (PROZAC) 10 MG capsule Take 1 capsule (10 mg total) by mouth daily. 30 capsule 1  . levothyroxine (SYNTHROID, LEVOTHROID) 88 MCG tablet TAKE 1 TABLET BY MOUTH EVERY DAY 90 tablet 1  . loratadine (CLARITIN) 10 MG tablet Take 10 mg by mouth daily.    Marland Kitchen losartan (COZAAR) 25 MG tablet Take 25 mg by mouth daily.    . meloxicam (MOBIC) 15 MG tablet Take 0.5-1 tablets (7.5-15 mg total) by mouth daily as needed for pain (low back pain). 30 tablet 1  . Omeprazole-Sodium Bicarbonate (ZEGERID OTC PO) Take by mouth.    . Probiotic Product (PROBIOTIC PO) Take 1 tablet by mouth daily as needed (digestion).    . benzonatate (TESSALON) 200 MG capsule Take 1 capsule (200 mg total) by mouth 2 (two) times daily as needed for cough. (Patient not taking: Reported on 05/28/2018) 20 capsule 0  . fluticasone (FLONASE) 50 MCG/ACT nasal spray Place 2 sprays into both nostrils daily. (Patient not taking: Reported on 05/28/2018) 16 g 2   No facility-administered medications prior to visit.           Objective:   Physical Exam Vitals:   06/08/18 1124  BP: 112/68  Pulse: 66  SpO2: 100%  Weight: 159 lb (72.1 kg)  Height: 5' 3.5" (1.613 m)   Gen: Pleasant, well-nourished, in no distress,  normal affect  ENT: No lesions,  mouth clear,  oropharynx clear, no postnasal drip  Neck: No JVD, no stridor  Lungs: No use of accessory muscles, clear without rales or rhonchi  Cardiovascular: RRR, heart sounds normal, no murmur or gallops, no peripheral edema  Musculoskeletal: No deformities, no cyanosis or clubbing  Neuro: alert, non focal  Skin: Warm, no lesions or  rash     CT CHEST WO CONTRAST, 01/05/2018 3:38 PM  INDICATION:ped, pneumothorax, known, xray done, blebs or bulbae suspected \ \ J43.9 Emphysematous bleb (HCC)  COMPARISON: None available  FINDINGS:  Thoracic inlet/central airways: Thyroid normal. Airway patent.     Mediastinum/hila/axilla: No adenopathy.     Heart/vessels: Normal heart size. No pericardial effusion. Aorta normal in caliber. Mild coronary arterial calcifications. Lungs/pleura: Severe confluent centrilobular emphysema is identified probably involving the bilateral upper lobes. Advanced obstructive characteristics are present. Bilateral pleural parenchymal scarring is identified. No suspicious pulmonary nodule or mass identified. No pleural effusion or pneumothorax.     Upper abdomen: Hepatic steatosis.     Chest wall/MSK: Exaggerated kyphotic curvature of the thoracolumbar spine is identified. Degenerative changes of the thoracolumbar spine are noted. No acute fractures or dislocations identified the visualized osseous structures.      CONCLUSION: 1. No evidence of pneumothorax or other acute cardiopulmonary process.  2. Severe confluent centrilobular emphysema involving the bilateral upper lobes.  3. Hepatic steatosis suggested       Assessment & Plan:  COPD with asthma [facility]) Regional emphysema noted on her CT scan done at [facility]  01/05/2018, clearly an upper lobe predominance with intact basilar parenchyma.  I think she would be a good candidate to talk to interventional radiology and possibly thoracic surgery, consider endobronchial valve placement or lung volume reduction.  She is interested in this.  I will refer her to [facility] to talk about the options.  We will refer you to interventional pulmonology at [facility], Dr.Whahidi, to discuss and consider endobronchial valve placement for regional emphysema.  He may recommend that you see a thoracic surgeon to also discuss the option of lung volume reduction surgery. Please continue your Anoro as you have been taking it. Please keep albuterol available to use 2 puffs if needed for shortness of breath, chest tightness, wheezing. You will receive the Prevnar-13 vaccine today. Remember to get the flu shot this fall.  Follow with Dr Lamonte Sakai in 4 months or sooner if you have any problems.  GERD Continue Zegerid  Allergic rhinitis Continue loratadine.   Baltazar Apo, MD, PhD 06/08/2018, 11:59 AM [facility] [phone] or if no answer [phone]

## 2018-06-08 NOTE — Assessment & Plan Note (Signed)
Regional emphysema noted on her CT scan done at [facility] 01/05/2018, clearly an upper lobe predominance with intact basilar parenchyma.  I think she would be a good candidate to talk to interventional radiology and possibly thoracic surgery, consider endobronchial valve placement or lung volume reduction.  She is interested in this.  I will refer her to [facility] to talk about the options.  We will refer you to interventional pulmonology at [facility], Dr.Whahidi, to discuss and consider endobronchial valve placement for regional emphysema.  He may recommend that you see a thoracic surgeon to also discuss the option of lung volume reduction surgery. Please continue your Anoro as you have been taking it. Please keep albuterol available to use 2 puffs if needed for shortness of breath, chest tightness, wheezing. You will receive the Prevnar-13 vaccine today. Remember to get the flu shot this fall.  Follow with Dr Lamonte Sakai in 4 months or sooner if you have any problems.

## 2018-06-09 ENCOUNTER — Other Ambulatory Visit: Payer: Self-pay | Admitting: Family Medicine

## 2018-06-09 DIAGNOSIS — E038 Other specified hypothyroidism: Secondary | ICD-10-CM

## 2018-06-22 ENCOUNTER — Other Ambulatory Visit: Payer: Self-pay | Admitting: Family Medicine

## 2018-06-22 DIAGNOSIS — I10 Essential (primary) hypertension: Secondary | ICD-10-CM

## 2018-06-26 ENCOUNTER — Encounter: Payer: Self-pay | Admitting: Family Medicine

## 2018-06-26 ENCOUNTER — Ambulatory Visit: Payer: PRIVATE HEALTH INSURANCE | Admitting: Family Medicine

## 2018-06-26 VITALS — BP 111/64 | HR 58 | Temp 97.9°F | Ht 63.5 in | Wt 159.0 lb

## 2018-06-26 DIAGNOSIS — F329 Major depressive disorder, single episode, unspecified: Secondary | ICD-10-CM

## 2018-06-26 HISTORY — DX: Major depressive disorder, single episode, unspecified: F32.9

## 2018-06-26 MED ORDER — FLUOXETINE HCL 20 MG PO CAPS: 20.0000 mg | ORAL_CAPSULE | Freq: Every day | ORAL | 3 refills | Status: DC

## 2018-06-26 NOTE — Progress Notes (Signed)
Subjective: CC: depression PCP: Stacy Norlander, DO TIW:PYKDX A Stacy [name] is a 62 y.o. female presenting to clinic today for:  1. Depression Patient presents for 5-week follow-up on what was thought to be reactive depression at last visit.  She was started on Prozac 10 mg daily.  She notes that she has been doing very well since starting the medication and cites that both anxiety and depression have improved greatly.  She continues to have quite a bit of difficulty with her [employer] covering and needed pulmonary procedure.  She is to be evaluated at [facility] once she is able to coordinate her insurance.  She notes that she had some nausea initially with the medication but she started taking with food and that has completely resolved.  No SI, HI.  Mood is much more stable per her report.   ROS: Per HPI  Allergies  Allergen Reactions  . Avelox [Moxifloxacin Hcl In Nacl] Hives  . Ciprofloxacin     REACTION: Rash  . Livalo [Pitavastatin]   . Moxifloxacin     REACTION: RASH  . Bactrim [Sulfamethoxazole-Trimethoprim] Rash  . Statins Other (See Comments)    myalgias  . Sulfamethoxazole-Trimethoprim Rash    REACTION: Rash   Past Medical History:  Diagnosis Date  . Asthma   . COPD, mild ([place])    Spirometerey 3/08  . Fatty liver disease, nonalcoholic   . GERD (gastroesophageal reflux disease)   . Hyperlipidemia   . Hypertension   . Hypothyroid Since 2003  . IBS (irritable bowel syndrome)   . Perimenopausal     Current Outpatient Medications:  .  albuterol (PROVENTIL HFA;VENTOLIN HFA) 108 (90 Base) MCG/ACT inhaler, TAKE 2 PUFFS BY MOUTH EVERY 6 HOURS AS NEEDED FOR WHEEZE OR SHORTNESS OF BREATH, Disp: 25.5 Inhaler, Rfl: 1 .  ANORO ELLIPTA 62.5-25 MCG/INH AEPB, TAKE 1 PUFF BY MOUTH EVERY DAY, Disp: 60 each, Rfl: 5 .  FLUoxetine (PROZAC) 10 MG capsule, Take 1 capsule (10 mg total) by mouth daily., Disp: 30 capsule, Rfl: 1 .  levothyroxine (SYNTHROID, LEVOTHROID) 88 MCG tablet,  TAKE 1 TABLET BY MOUTH EVERY DAY, Disp: 90 tablet, Rfl: 3 .  loratadine (CLARITIN) 10 MG tablet, Take 10 mg by mouth daily., Disp: , Rfl:  .  losartan (COZAAR) 25 MG tablet, TAKE 1 TABLET BY MOUTH EVERY DAY, Disp: 90 tablet, Rfl: 1 .  meloxicam (MOBIC) 15 MG tablet, Take 0.5-1 tablets (7.5-15 mg total) by mouth daily as needed for pain (low back pain)., Disp: 30 tablet, Rfl: 1 .  Omeprazole-Sodium Bicarbonate (ZEGERID OTC PO), Take by mouth., Disp: , Rfl:  .  Probiotic Product (PROBIOTIC PO), Take 1 tablet by mouth daily as needed (digestion)., Disp: , Rfl:  Social History   Socioeconomic History  . Marital status: Divorced    Spouse name: Not on file  . Number of children: 2  . Years of education: Not on file  . Highest education level: Not on file  Occupational History  . Occupation: Works in Financial controller  . Financial resource strain: Not on file  . Food insecurity:    Worry: Not on file    Inability: Not on file  . Transportation needs:    Medical: Not on file    Non-medical: Not on file  Tobacco Use  . Smoking status: Former Smoker    Packs/day: 0.50    Years: 30.00    Pack years: 15.00    Types: Cigarettes    Last attempt to  quit: 10/25/2007    Years since quitting: 10.6  . Smokeless tobacco: Never Used  Substance and Sexual Activity  . Alcohol use: Yes    Alcohol/week: 0.0 standard drinks    Comment: Occasional-once a year  . Drug use: No  . Sexual activity: Yes    Birth control/protection: Surgical  Lifestyle  . Physical activity:    Days per week: Not on file    Minutes per session: Not on file  . Stress: Not on file  Relationships  . Social connections:    Talks on phone: Not on file    Gets together: Not on file    Attends religious service: Not on file    Active member of club or organization: Not on file    Attends meetings of clubs or organizations: Not on file    Relationship status: Not on file  . Intimate partner violence:    Fear of  current or ex partner: Not on file    Emotionally abused: Not on file    Physically abused: Not on file    Forced sexual activity: Not on file  Other Topics Concern  . Not on file  Social History Narrative   Remarried.    Previous occupation: Customer service manager.  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      Moved from [facility] in [place]   2 children, has stepchildren and grandchildren   Does not exercise   Family History  Problem Relation Age of Onset  . Hypertension Father   . Hyperlipidemia Father   . Heart disease Father        Died at age 96 of MI  . Hypothyroidism Father   . Raynaud syndrome Sister   . Rheum arthritis Sister   . Thyroid disease Sister   . CAD Brother        has 4 stents  . Rheum arthritis Brother   . Heart disease Sister 70       h/o MI @age  35  . Thyroid disease Sister   . Breast cancer Neg Hx   . Ovarian cancer Neg Hx   . Prostate cancer Neg Hx   . Colon cancer Neg Hx     Objective: Office vital signs reviewed. BP 111/64   Pulse (!) 58   Temp 97.9 F (36.6 C) (Oral)   Ht 5' 3.5" (1.613 m)   Wt 159 lb (72.1 kg)   BMI 27.72 kg/m   Physical Examination:  General: Awake, alert, well nourished, No acute distress HEENT: sclera white, MMM Cardio: regular rate and rhythm, S1S2 heard, no murmurs appreciated Pulm: Decreased breath sounds within the upper lung fields bilaterally.  No wheezes, rhonchi or rales; normal work of breathing on room air Psych: Mood stable, speech normal, affect appropriate, pleasant, interactive, does not appear to be responding to internal stimuli. Depression screen [facility] 2/9 06/26/2018 05/22/2018 03/21/2018  Decreased Interest 1 0 0  Down, Depressed, Hopeless 1 2 0  PHQ - 2 Score 2 2 0  Altered sleeping 3 3 -  Tired, decreased energy 3 3 -  Change in appetite 1 2 -  Feeling bad or failure about yourself  0 2 -  Trouble concentrating 0 1 -  Moving slowly or fidgety/restless - 1 -  Suicidal thoughts 0 0 -  PHQ-9 Score 9 14 -    Difficult doing work/chores Somewhat difficult - -   GAD 7 : Generalized Anxiety Score 06/26/2018  Nervous, Anxious, on Edge 2  Control/stop  worrying 1  Worry too much - different things 1  Trouble relaxing 1  Restless 0  Easily annoyed or irritable 0  Afraid - awful might happen 0  Total GAD 7 Score 5  Anxiety Difficulty Not difficult at all    Assessment/ Plan: 62 y.o. female   1. Reactive depression Doing much better on the Prozac per her report.  Her PHQ 9 score has decreased.  She does wish to try a higher dose to see if she can even get better control of her symptoms.  This is been increased to 20 mg daily.  Home care instructions reviewed.  Reasons for return discussed.  Given her lapse in insurance, I am willing to have her recheck with me in about 4 weeks through my chart.  If she is stable, will continue to fill her medicine.  If not doing well, I did encourage her to follow-up for further discussion.  She was good understanding and will follow-up as directed.   Meds ordered this encounter  Medications  . FLUoxetine (PROZAC) 20 MG capsule    Sig: Take 1 capsule (20 mg total) by mouth daily.    Dispense:  90 capsule    Refill:  [place], [place] [phone]

## 2018-06-28 ENCOUNTER — Telehealth: Payer: Self-pay

## 2018-06-28 NOTE — Telephone Encounter (Signed)
Several attempts have been made to contact patient without success. Patient will be placed on the inactive list.  If services are needed again.  Please contact [facility] at [phone].    Information will be routed to the PCP and Dr. Hisada  

## 2018-07-09 ENCOUNTER — Telehealth: Payer: Self-pay | Admitting: Emergency Medicine

## 2018-07-09 NOTE — Telephone Encounter (Signed)
Called and spoke with patient, she stated that she has been taking Anoro for several months. Her Cobra ended for the yearly period on August 31st. She stated that her Anoro would run out before she was able to get the prescription card. She wanted to get some samples before she ran out of her Anoro due to it costing 400 without the prescription card.   Sample placed up front. Nothing further needed.

## 2018-07-11 ENCOUNTER — Telehealth: Payer: Self-pay | Admitting: Emergency Medicine

## 2018-07-11 NOTE — Telephone Encounter (Signed)
Pt stopped by the office today and dropped off a letter for me. It states that we referred her to [facility] to see Dr. Foye Deer - Interventional Pulmonology for an Endobronchial Valve Placement. She states that she was told that Dr. Colonel Bald only deals with pts who have cancer. Pt is requesting for an alternative doctor to see. She is also waiting for her insurance cards to come in the mail so that she will be covered for this visit.  RB - please advise. Thanks.

## 2018-07-12 ENCOUNTER — Institutional Professional Consult (permissible substitution): Payer: PRIVATE HEALTH INSURANCE | Admitting: Neurology

## 2018-07-12 ENCOUNTER — Encounter

## 2018-07-12 NOTE — Telephone Encounter (Signed)
Called and left a message with Stacy [name] at Dr. Meliton Rattan office. We need to get her an appointment with their clinic. Per the referral that was placed, Stacy [name] has been leaving several messages for Stacy [name] and she has not gotten a call back.  Spoke with the pt. States that someone at Dr. Meliton Rattan office was the one who advised her that he did not perform the procedure that she is needed. Pt is fine with seeing anyone but she wanted Dr. Agustina Caroli recommendation on someone. She gave me a direct number where I could call to make her an appointment >> [phone].  I will speak with RB about who he recommends and call and make her an appointment for early October.

## 2018-07-12 NOTE — Telephone Encounter (Signed)
I think that they misunderstood the referral at [facility]. I want her to see Interventional Pulmonology to discuss endobronchial valve placement. It can be with any of their physicians who do endobronchial valves.

## 2018-07-14 ENCOUNTER — Other Ambulatory Visit: Payer: Self-pay | Admitting: Family Medicine

## 2018-07-16 NOTE — Telephone Encounter (Signed)
RB who do you recommend?

## 2018-07-17 NOTE — Telephone Encounter (Signed)
I called to speak with Rachael to see which MDs are doing endobronchial valve placement. Their phones shut off at 4pm. Will have to call back [phone], option 4.

## 2018-07-18 NOTE — Telephone Encounter (Signed)
Called Rachael and left a message for her to call back, discuss which MD should see the pt. Left office and personal phone #s

## 2018-07-20 NOTE — Telephone Encounter (Signed)
Patient calling about appointment for [facility] and would like to speak to nurse, CB is [phone]

## 2018-07-20 NOTE — Telephone Encounter (Signed)
Spoke with pt. Advised her that RB has tried to get in touch with the office at [facility] and had to leave a message. Pt states that she is going to try to get in touch with them herself. She will call if she has any issues. Will leave open to follow up with her next week.

## 2018-07-24 NOTE — Telephone Encounter (Signed)
[facility] still has never called me about this referral. My notes should be adequate to explain that the patient needs to be evaluated for endobronchial valve placement due to regional emphysema.

## 2018-07-24 NOTE — Telephone Encounter (Signed)
Spoke with pt, she states [facility] needs more information about her diagnosis. We will have to send them more information. They want something that says an endobronchial valve is needed for the pt. I faxed over the PFT but advised rachel that the patient had a disc with her CT. We are unable to print report in care everywhere since she had it done at [facility] will call pt to schedule and nothing further is needed.   [facility] [phone]  Apolonio Schneiders from [facility] needs CT chest and PFT sent to [phone]

## 2018-07-27 ENCOUNTER — Encounter: Payer: Self-pay | Admitting: Family Medicine

## 2018-07-27 ENCOUNTER — Telehealth: Payer: Self-pay | Admitting: Family Medicine

## 2018-07-27 NOTE — Telephone Encounter (Signed)
error 

## 2018-08-21 ENCOUNTER — Telehealth: Payer: Self-pay | Admitting: Emergency Medicine

## 2018-08-21 DIAGNOSIS — J432 Centrilobular emphysema: Secondary | ICD-10-CM

## 2018-08-21 DIAGNOSIS — R0609 Other forms of dyspnea: Secondary | ICD-10-CM

## 2018-08-21 HISTORY — DX: Other forms of dyspnea: R06.09

## 2018-08-21 NOTE — Telephone Encounter (Signed)
Called and spoke with pt who sated she was told by pulmonologist at [facility] that she will need to have pulmonary rehab before anything can be considered in regards to any treatment.  Dr. Lamonte Sakai, please advise if you are okay with [place] placing order for pt to begin pulmonary rehab. If it is okay, pt wants to do pulmonary rehab at [facility] due to it being closer to her home.

## 2018-08-21 NOTE — Telephone Encounter (Signed)
Yes, please refer her. The dx is emphysema.

## 2018-08-22 NOTE — Telephone Encounter (Signed)
Spoke with the pt and notified ok per RB to refer to pulm rehab  She prefers [facility]  Order placed and nothing further needed

## 2018-08-23 ENCOUNTER — Encounter: Payer: Self-pay | Admitting: Internal Medicine

## 2018-08-23 ENCOUNTER — Ambulatory Visit: Payer: PRIVATE HEALTH INSURANCE | Admitting: Nurse Practitioner

## 2018-08-23 ENCOUNTER — Encounter: Payer: Self-pay | Admitting: Nurse Practitioner

## 2018-08-23 VITALS — BP 126/79 | HR 63 | Temp 96.7°F | Ht 63.0 in | Wt 155.0 lb

## 2018-08-23 DIAGNOSIS — K625 Hemorrhage of anus and rectum: Secondary | ICD-10-CM | POA: Diagnosis not present

## 2018-08-23 DIAGNOSIS — R319 Hematuria, unspecified: Secondary | ICD-10-CM

## 2018-08-23 LAB — URINALYSIS, COMPLETE
Bilirubin, UA: NEGATIVE
Glucose, UA: NEGATIVE
Ketones, UA: NEGATIVE
Leukocytes, UA: NEGATIVE
Nitrite, UA: NEGATIVE
Protein, UA: NEGATIVE
RBC, UA: NEGATIVE
Specific Gravity, UA: <1.005 — ABNORMAL LOW (ref 1.005–1.030)
Urobilinogen, Ur: 0.2 mg/dL (ref 0.2–1.0)
pH, UA: 6 (ref 5.0–7.5)

## 2018-08-23 LAB — HEMOGLOBIN, FINGERSTICK: Hemoglobin: 14.4 g/dL (ref 11.1–15.9)

## 2018-08-23 LAB — MICROSCOPIC EXAMINATION
BACTERIA UA: NONE SEEN
Epithelial Cells (non renal): NONE SEEN /hpf (ref 0–10)
RBC, UA: NONE SEEN /hpf (ref 0–2)
RENAL EPITHEL UA: NONE SEEN /HPF

## 2018-08-23 NOTE — Progress Notes (Signed)
   Subjective:    Patient ID: Stacy [name], female    DOB: 11-30-1955, 62 y.o.   MRN: [id number]  Hematuria  This is a new problem. The current episode started 1 to 4 weeks ago. The problem has been gradually worsening since onset. She describes the hematuria as gross hematuria. The hematuria occurs during the initial portion of her urinary stream. She reports no clotting in her urine stream. Her pain is at a severity of 1/10. The pain is mild. She describes her urine color as yellow. Irritative symptoms include frequency (chronic) and urgency (chronic). Associated symptoms include abdominal pain and flank pain (achy). She is sexually active. There is no history of kidney stones or tobacco use.  Patient had 2 incidents of frank blood in toilet and on toilet paper after urinating and stooling. Unsure if blood is rectal or vaginal. Patient does have hemorrhoids (external) but are not inflamed. Hx of colon polyps 10 years ago, due for colonoscopy.     Review of Systems  Constitutional: Negative.   HENT: Negative.   Eyes: Negative.   Respiratory: Negative.   Cardiovascular: Negative.   Gastrointestinal: Positive for abdominal pain.  Endocrine: Negative.   Genitourinary: Positive for flank pain (achy), frequency (chronic), hematuria and urgency (chronic).  Skin: Negative.   Allergic/Immunologic: Negative.   Neurological: Negative.   Hematological: Negative.   Psychiatric/Behavioral: Negative.        Objective:   Physical Exam  Constitutional: She is oriented to person, place, and time. She appears well-developed and well-nourished.  HENT:  Head: Normocephalic and atraumatic.  Mouth/Throat: No oropharyngeal exudate.  Eyes: Pupils are equal, round, and reactive to light. EOM are normal.  Neck: Normal range of motion. Neck supple. No thyromegaly present.  Cardiovascular: Normal rate, regular rhythm and normal heart sounds.  Pulmonary/Chest: Effort normal. She has decreased breath sounds  in the right lower field.  Abdominal: Soft. Bowel sounds are normal. There is no tenderness. There is no guarding.  Genitourinary:  Genitourinary Comments: No CVA tenderness  Musculoskeletal: Normal range of motion.  Neurological: She is alert and oriented to person, place, and time. She displays normal reflexes. No cranial nerve deficit.  Skin: Skin is warm and dry.  Psychiatric: She has a normal mood and affect. Her behavior is normal. Judgment and thought content normal.  Nursing note and vitals reviewed.   BP 126/79   Pulse 63   Temp (!) 96.7 F (35.9 C) (Oral)   Ht 5\' 3"  (1.6 m)   Wt 155 lb (70.3 kg)   BMI 27.46 kg/m   HGB-14.4    Assessment & Plan:  Stacy [name] in today with chief complaint of Hematuria (Unsure if its from urine or vaginal bleeding or rectal bleeding)   1. Hematuria, unspecified type Urine negative - Urinalysis, Complete  2. Rectal bleeding - Hemoglobin, fingerstick - Ambulatory referral to Gastroenterology  * really think this bleeding is from rectum- no rectal exam done, because is overdue for colonoscopy. Needs colonoscopy done before December because she is having heart surgery and not sure she will be able to have done after heart surgery.  Mary-Margaret Hassell Done, FNP

## 2018-08-23 NOTE — Patient Instructions (Signed)
Rectal Bleeding °Rectal bleeding is when blood comes out of the opening of the butt (anus). People with this kind of bleeding may notice bright red blood in their underwear or in the toilet after they poop (have a bowel movement). They may also have dark red or black poop (stool). Rectal bleeding is often a sign that something is wrong. It needs to be checked by a doctor. °Follow these instructions at home: °Watch for any changes in your condition. Take these actions to help with bleeding and discomfort: °· Eat a diet that is high in fiber. This will keep your poop soft so it is easier for you to poop without pushing too hard. Ask your doctor to tell you what foods and drinks are high in fiber. °· Drink enough fluid to keep your pee (urine) clear or pale yellow. This also helps keep your poop soft. °· Try taking a warm bath. This may help with pain. °· Keep all follow-up visits as told by your doctor. This is important. ° °Get help right away if: °· You have new bleeding. °· You have more bleeding than before. °· You have black or dark red poop. °· You throw up (vomit) blood or something that looks like coffee grounds. °· You have pain or tenderness in your belly (abdomen). °· You have a fever. °· You feel weak. °· You feel sick to your stomach (nauseous). °· You pass out (faint). °· You have very bad pain in your butt. °· You cannot poop. °This information is not intended to replace advice given to you by your health care provider. Make sure you discuss any questions you have with your health care provider. °Document Released: 06/22/2011 Document Revised: 03/17/2016 Document Reviewed: 12/06/2015 °Elsevier Interactive Patient Education © 2018 Elsevier Inc. ° °

## 2018-08-24 ENCOUNTER — Telehealth: Payer: Self-pay | Admitting: Emergency Medicine

## 2018-08-24 ENCOUNTER — Telehealth (HOSPITAL_COMMUNITY): Payer: Self-pay | Admitting: *Deleted

## 2018-08-24 DIAGNOSIS — J432 Centrilobular emphysema: Secondary | ICD-10-CM

## 2018-08-24 NOTE — Telephone Encounter (Signed)
Referral received from Dr Lamonte Sakai for pulmonary rehab for Centrilobular emphysema.  Pt in need to get in to pulmonary rehab sooner than our schedule would allow.  Called [facility] and [facility] for their schedule availability.  [facility] offered the earliest availability to schedule. Pt agreeable to this.  Pt desires to get four weeks of exercise in before her appt at [facility]. Referral routed to [facility] workque.  Meredith aware.  Cherre Huger, BSN Cardiac and Training and development officer

## 2018-08-24 NOTE — Telephone Encounter (Signed)
Called and spoke to patient, patient states that [facility] does not have Pulmonary Rehab only cardiac, she requests is to be done at [facility]. Informed patient I would place the order. Voiced understanding. Nothing further is needed at this time.

## 2018-08-29 ENCOUNTER — Encounter: Payer: Self-pay | Admitting: Internal Medicine

## 2018-08-29 ENCOUNTER — Ambulatory Visit (INDEPENDENT_AMBULATORY_CARE_PROVIDER_SITE_OTHER): Payer: 59 | Admitting: Internal Medicine

## 2018-08-29 VITALS — BP 112/72 | HR 74 | Ht 63.5 in | Wt 153.0 lb

## 2018-08-29 DIAGNOSIS — K625 Hemorrhage of anus and rectum: Secondary | ICD-10-CM

## 2018-08-29 MED ORDER — NA SULFATE-K SULFATE-MG SULF 17.5-3.13-1.6 GM/177ML PO SOLN: 1.0000 | Freq: Once | ORAL | 0 refills | Status: AC

## 2018-08-29 NOTE — Patient Instructions (Signed)

## 2018-08-29 NOTE — Progress Notes (Signed)
HISTORY OF PRESENT ILLNESS:  Stacy [name] is a 62 y.o. female who was sent today by her primary care provider Ms. Hassell Done with concerns regarding the etiology of rectal bleeding.  Patient is new to this practice.  She reports significant bleeding approximately 1 month ago.  Both with wiping and in the toilet bowl.  She feels this was rectal bleeding.  She denies associated abdominal or rectal pain.  This has improved in recent weeks.  Review of outside laboratories from August 23, 2018 shows unremarkable hemoglobin which was normal at 14.4.  Review of outside x-rays shows CT scan of the abdomen and pelvis March 2015 to evaluate abdominal pain.  Examination was negative though significant hepatic steatosis noted.  Patient does not take blood thinners.  She does use Mobic as needed for low back pain.  Review of pathology report from April 05, 2011 suggests that the patient had colonoscopy with Dr. Arta Silence of [facility].  That report stated to hyperplastic polyps in the sigmoid and rectosigmoid region.  The formal report has been requested.  Patient denies family history of colon cancer.  No weight loss.  She does have a history of COPD but not require oxygen.  She no longer smokes  REVIEW OF SYSTEMS:  All non-GI ROS negative as otherwise stated in the HPI except for back pain, cough, fatigue, urinary frequency, shortness of breath  Past Medical History:  Diagnosis Date  . Asthma   . COPD, mild ([place])    Spirometerey 3/08  . Fatty liver disease, nonalcoholic   . GERD (gastroesophageal reflux disease)   . Hyperlipidemia   . Hypertension   . Hypothyroid Since 2003  . IBS (irritable bowel syndrome)   . Perimenopausal     Past Surgical History:  Procedure Laterality Date  . ABLATION    . CARDIAC CATHETERIZATION N/A 06/12/2015   Procedure: Left Heart Cath and Coronary Angiography;  Surgeon: Burnell Blanks, MD;  Location: [facility];  Service: Cardiovascular;  Laterality: N/A;  .  CESAREAN SECTION    . LTCS     x2  . SPIROMETRY  01/05/2007   FVC 68% predicted; FEV1 44% predicted; FEV1/FVC 63% predicted = moderate obstruction with low vital capacity possibly due to restriction  . TUBAL LIGATION      Social History AHNIKA HANNIBAL  reports that she quit smoking about 10 years ago. Her smoking use included cigarettes. She has a 15.00 pack-year smoking history. She has never used smokeless tobacco. She reports that she drinks alcohol. She reports that she does not use drugs.  family history includes CAD in her brother; Diabetes in her maternal grandmother; Heart disease in her father; Heart disease (age of onset: 76) in her sister; Hyperlipidemia in her father; Hypertension in her father; Hypothyroidism in her father; Raynaud syndrome in her sister; Rheum arthritis in her brother and sister; Thyroid disease in her sister and sister.  Allergies  Allergen Reactions  . Avelox [Moxifloxacin Hcl In Nacl] Hives  . Ciprofloxacin     REACTION: Rash  . Livalo [Pitavastatin]   . Moxifloxacin     REACTION: RASH  . Bactrim [Sulfamethoxazole-Trimethoprim] Rash  . Statins Other (See Comments)    myalgias  . Sulfamethoxazole-Trimethoprim Rash    REACTION: Rash       PHYSICAL EXAMINATION: Vital signs: BP 112/72   Pulse 74   Ht 5' 3.5" (1.613 m)   Wt 153 lb (69.4 kg)   BMI 26.68 kg/m   Constitutional:  generally well-appearing, no acute distress Psychiatric: alert and oriented x3, cooperative Eyes: extraocular movements intact, anicteric, conjunctiva pink Mouth: oral pharynx moist, no lesions Neck: supple no lymphadenopathy Cardiovascular: heart regular rate and rhythm, no murmur Lungs: clear to auscultation bilaterally Abdomen: soft, nontender, nondistended, no obvious ascites, no peritoneal signs, normal bowel sounds, no organomegaly Rectal: Deferred until colonoscopy Extremities: no clubbing, cyanosis, or lower extremity edema bilaterally Skin: no lesions on visible  extremities Neuro: No focal deficits.  Cranial nerves intact  ASSESSMENT:  1.  Rectal bleeding.  Etiology unclear 2.  History of hyperplastic colon polyps 2012 elsewhere 3.  Multiple medical problems.  Stable   PLAN:  1.  Schedule colonoscopy.The nature of the procedure, as well as the risks, benefits, and alternatives were carefully and thoroughly reviewed with the patient. Ample time for discussion and questions allowed. The patient understood, was satisfied, and agreed to proceed. 2.  Further recommendations after the above results known  A copy of this consultation note has been sent to Ms. Hassell Done

## 2018-09-04 ENCOUNTER — Encounter: Payer: Self-pay | Admitting: Internal Medicine

## 2018-09-04 ENCOUNTER — Ambulatory Visit (AMBULATORY_SURGERY_CENTER): Payer: 59 | Admitting: Internal Medicine

## 2018-09-04 VITALS — BP 120/70 | HR 50 | Temp 98.9°F | Resp 15 | Ht 63.0 in | Wt 153.0 lb

## 2018-09-04 DIAGNOSIS — D122 Benign neoplasm of ascending colon: Secondary | ICD-10-CM

## 2018-09-04 DIAGNOSIS — K625 Hemorrhage of anus and rectum: Secondary | ICD-10-CM

## 2018-09-04 MED ORDER — SODIUM CHLORIDE 0.9 % IV SOLN: 500.0000 mL | Freq: Once | INTRAVENOUS | Status: DC

## 2018-09-04 NOTE — Op Note (Signed)
[facility] Patient Name: Stacy [name] Procedure Date: 09/04/2018 2:30 PM MRN: [id number] Endoscopist: Stacy [name] , MD Age: 62 Referring MD:  Date of Birth: 1956-04-09 Gender: Female Account #: [id number] Procedure:                Colonoscopy cold snare polypectomy x 1 Indications:              Rectal bleeding Medicines:                Monitored Anesthesia Care Procedure:                Pre-Anesthesia Assessment:                           - Prior to the procedure, a History and Physical                            was performed, and patient medications and                            allergies were reviewed. The patient's tolerance of                            previous anesthesia was also reviewed. The risks                            and benefits of the procedure and the sedation                            options and risks were discussed with the patient.                            All questions were answered, and informed consent                            was obtained. Prior Anticoagulants: The patient has                            taken no previous anticoagulant or antiplatelet                            agents. ASA Grade Assessment: II - A patient with                            mild systemic disease. After reviewing the risks                            and benefits, the patient was deemed in                            satisfactory condition to undergo the procedure.                           After obtaining informed consent, the colonoscope  was passed under direct vision. Throughout the                            procedure, the patient's blood pressure, pulse, and                            oxygen saturations were monitored continuously. The                            Colonoscope was introduced through the anus and                            advanced to the the cecum, identified by                            appendiceal orifice and  ileocecal valve. The                            ileocecal valve, appendiceal orifice, and rectum                            were photographed. The quality of the bowel                            preparation was excellent. The colonoscopy was                            performed without difficulty. The patient tolerated                            the procedure well. The bowel preparation used was                            SUPREP. Scope In: 2:34:11 PM Scope Out: 2:51:30 PM Scope Withdrawal Time: 0 hours 9 minutes 54 seconds  Total Procedure Duration: 0 hours 17 minutes 19 seconds  Findings:                 A 4 mm polyp was found in the ascending colon. The                            polyp was removed with a cold snare. Resection and                            retrieval were complete.                           Multiple diverticula were found in the sigmoid                            colon.                           Internal hemorrhoids were found during  retroflexion. The hemorrhoids were small.                           The exam was otherwise without abnormality on                            direct and retroflexion views. Complications:            No immediate complications. Estimated blood loss:                            None. Estimated Blood Loss:     Estimated blood loss: none. Impression:               - One 4 mm polyp in the ascending colon, removed                            with a cold snare. Resected and retrieved.                           - Diverticulosis in the sigmoid colon.                           - Internal hemorrhoids.                           - The examination was otherwise normal on direct                            and retroflexion views. Recommendation:           - Repeat colonoscopy in 5 years for surveillance if                            polyp adenomatous, otherwise 10 years.                           - Patient has a contact number  available for                            emergencies. The signs and symptoms of potential                            delayed complications were discussed with the                            patient. Return to normal activities tomorrow.                            Written discharge instructions were provided to the                            patient.                           - Resume previous diet.                           -  Continue present medications.                           - Await pathology results. Stacy Chuck. Henrene Pastor, MD 09/04/2018 2:56:30 PM This report has been signed electronically.

## 2018-09-04 NOTE — Progress Notes (Signed)
Report to PACU, RN, vss, BBS= Clear.  

## 2018-09-04 NOTE — Patient Instructions (Signed)
Handouts given : Polyps, Hemorrhoids and Diverticulosis.  YOU HAD AN ENDOSCOPIC PROCEDURE TODAY AT [facility]:   Refer to the procedure report that was given to you for any specific questions about what was found during the examination.  If the procedure report does not answer your questions, please call your gastroenterologist to clarify.  If you requested that your care partner not be given the details of your procedure findings, then the procedure report has been included in a sealed envelope for you to review at your convenience later.  YOU SHOULD EXPECT: Some feelings of bloating in the abdomen. Passage of more gas than usual.  Walking can help get rid of the air that was put into your GI tract during the procedure and reduce the bloating. If you had a lower endoscopy (such as a colonoscopy or flexible sigmoidoscopy) you may notice spotting of blood in your stool or on the toilet paper. If you underwent a bowel prep for your procedure, you may not have a normal bowel movement for a few days.  Please Note:  You might notice some irritation and congestion in your nose or some drainage.  This is from the oxygen used during your procedure.  There is no need for concern and it should clear up in a day or so.  SYMPTOMS TO REPORT IMMEDIATELY:   Following lower endoscopy (colonoscopy or flexible sigmoidoscopy):  Excessive amounts of blood in the stool  Significant tenderness or worsening of abdominal pains  Swelling of the abdomen that is new, acute  Fever of 100F or higher   For urgent or emergent issues, a gastroenterologist can be reached at any hour by calling [phone].   DIET:  We do recommend a small meal at first, but then you may proceed to your regular diet.  Drink plenty of fluids but you should avoid alcoholic beverages for 24 hours.  ACTIVITY:  You should plan to take it easy for the rest of today and you should NOT DRIVE or use heavy machinery until tomorrow  (because of the sedation medicines used during the test).    FOLLOW UP: Our staff will call the number listed on your records the next business day following your procedure to check on you and address any questions or concerns that you may have regarding the information given to you following your procedure. If we do not reach you, we will leave a message.  However, if you are feeling well and you are not experiencing any problems, there is no need to return our call.  We will assume that you have returned to your regular daily activities without incident.  If any biopsies were taken you will be contacted by phone or by letter within the next 1-3 weeks.  Please call us at [phone] if you have not heard about the biopsies in 3 weeks.    SIGNATURES/CONFIDENTIALITY: You and/or your care partner have signed paperwork which will be entered into your electronic medical record.  These signatures attest to the fact that that the information above on your After Visit Summary has been reviewed and is understood.  Full responsibility of the confidentiality of this discharge information lies with you and/or your care-partner.

## 2018-09-04 NOTE — Progress Notes (Signed)
Pt's states no medical or surgical changes since previsit or office visit. 

## 2018-09-04 NOTE — Progress Notes (Signed)
Called to room to assist during endoscopic procedure.  Patient ID and intended procedure confirmed with present staff. Received instructions for my participation in the procedure from the performing physician.  

## 2018-09-05 ENCOUNTER — Telehealth: Payer: Self-pay

## 2018-09-05 NOTE — Telephone Encounter (Signed)
  Follow up Call-  Call back number 09/04/2018  Post procedure Call Back phone  # [phone]  Permission to leave phone message Yes  Some recent data might be hidden     Patient questions:  Do you have a fever, pain , or abdominal swelling? No. Pain Score  0 *  Have you tolerated food without any problems? Yes.    Have you been able to return to your normal activities? Yes.    Do you have any questions about your discharge instructions: Diet   No. Medications  No. Follow up visit  No.  Do you have questions or concerns about your Care? No.  Actions: * If pain score is 4 or above: No action needed, pain <4.

## 2018-09-10 ENCOUNTER — Encounter: Payer: Self-pay | Admitting: Internal Medicine

## 2018-09-12 ENCOUNTER — Encounter: Payer: Self-pay | Admitting: *Deleted

## 2018-09-19 ENCOUNTER — Other Ambulatory Visit: Payer: Self-pay | Admitting: *Deleted

## 2018-09-19 MED ORDER — UMECLIDINIUM-VILANTEROL 62.5-25 MCG/INH IN AEPB: INHALATION_SPRAY | RESPIRATORY_TRACT | 2 refills | Status: DC

## 2018-11-05 ENCOUNTER — Ambulatory Visit: Payer: 59 | Admitting: Emergency Medicine

## 2018-11-05 ENCOUNTER — Encounter: Payer: Self-pay | Admitting: Emergency Medicine

## 2018-11-05 DIAGNOSIS — J449 Chronic obstructive pulmonary disease, unspecified: Secondary | ICD-10-CM | POA: Diagnosis not present

## 2018-11-05 DIAGNOSIS — K219 Gastro-esophageal reflux disease without esophagitis: Secondary | ICD-10-CM | POA: Diagnosis not present

## 2018-11-05 DIAGNOSIS — J301 Allergic rhinitis due to pollen: Secondary | ICD-10-CM

## 2018-11-05 NOTE — Progress Notes (Signed)
Subjective:    Patient ID: Stacy [name], female    DOB: 1956/06/17, 63 y.o.   MRN: [id number]  Shortness of Breath  Associated symptoms include headaches, leg swelling and wheezing.   63 year-old former smoker (40+ pack years), history of hypertension, hyperlipidemia, hypothyroidism, irritable bowel syndrome.  There is a history of COPD vs asthma that was made about 2007 clinically and by spirometry 2008.  She is referred today for evaluation of chronic cough of 2 months duration, exertional dyspnea. 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 In retrospect she may have had some dyspnea even before that. She has increased wheeze. The cough is prod of thick white or frothy.  She hears wheeze at night. The warehouse exposure is now over.    ROV 01/26/18 --follow-up visit today for COPD with a bronchodilator response, asthmatic features.  She also has allergic rhinitis, chronic cough.  She has a chest x-ray that suggested some apical emphysematous change and hyperinflation.  This prompted a CT scan of the chest that was done at [facility] 01/05/18.  I have the report available but I cannot see the images.  They made note of severe confluent centrilobular emphysema in the bilateral upper lobes.  There are no pulmonary nodules, no interstitial lung disease. We changed her to Anoro from [facility] > she feels that this change helped her breathing but that she is having more mucous production. Difficult to say whether she is missing the ICS vs having more PND since its allergy season. She wants to go back to work, but still feels too limited.   ROV 04/03/18 --Stacy [name] has a history of COPD with a bronchodilator response and asthmatic features.  She also has chronic cough.  Both of these are influenced by her allergic rhinitis.  She has central lobular emphysematous change by CT  scan.  She was recently treated for an mild flare with increased cough, plugs, dyspnea, rx with azithro + pred. She is still having persistent cough with thick mucous, no longer green. She stopped flonase a month ago. She is on loratadine, uses zegerid prn but notes refractory GERD. Her breathing is improved. She is on Anoro, feels that she benefits.   ROV 06/08/18 --follow-up visit for patient with a history of COPD with asthmatic features, bronchodilator response.  She also has allergic rhinitis, GERD and chronic cough.  She has been managed on Anoro.  I was able to review her CT scan of the chest that was done 01/05/2018.  I do agree that her centrilobular emphysema is definitely upper lobe predominant. She is interested in possible LVRS - at least to hear about it. I have also mentioned endobronchial valves to her. She is doing well, no active sx at this time. She is UTD pneumovax, needs Prevnar  ROV 11/05/18 --Stacy [name] has COPD with asthmatic features and a bronchodilator response, chronic cough in the setting of this and also allergic rhinitis and GERD.  She has regional, upper lobe predominant emphysematous change and I referred her to [facility] for possible endobronchial valve placement.  She reports today that she was seen there, underwent repeat PFT, ABG, CT. She may be a good candidate, but at this point they believed that this was premature. She did pulm rehab, is now going to the [facility]. She feels that her breathing is a bit better.  She is on Anoro, uses albuterol about once a month. Remains on loraradine, zegerid. She has cough, has had more mucous over the last week or so.    Review of Systems  HENT: Positive for congestion, postnasal drip and sinus pressure.   Respiratory: Positive for cough, chest tightness, shortness of breath and wheezing.   Cardiovascular: Positive for palpitations and leg swelling.  Allergic/Immunologic: Negative.   Neurological: Positive for headaches.    Psychiatric/Behavioral: The patient is nervous/anxious.     Past Medical History:  Diagnosis Date  . Asthma   . COPD, mild ([place])    Spirometerey 3/08  . Fatty liver disease, nonalcoholic   . GERD (gastroesophageal reflux disease)   . Hyperlipidemia   . Hypertension   . Hypothyroid Since 2003  . IBS (irritable bowel syndrome)   . Perimenopausal      Family History  Problem Relation Age of Onset  . Hypertension Father   . Hyperlipidemia Father   . Heart disease Father        Died at age 85 of MI  . Hypothyroidism Father   . Diabetes Maternal Grandmother   . Raynaud syndrome Sister   . Rheum arthritis Sister   . Thyroid disease Sister   . CAD Brother        has 4 stents  . Rheum arthritis Brother   . Heart disease Sister 2       h/o MI @age  32  . Thyroid disease Sister   . Breast cancer Neg Hx   . Ovarian cancer Neg Hx   . Prostate cancer Neg Hx   . Colon cancer Neg Hx   . Stomach cancer Neg Hx   . 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  No other occupational exposure.  Has lived in [place] and [place] No military   Allergies  Allergen Reactions  . Avelox [Moxifloxacin Hcl In Nacl] Hives  . Ciprofloxacin     REACTION: Rash  . Livalo [Pitavastatin]   . Moxifloxacin     REACTION: RASH  . Bactrim [Sulfamethoxazole-Trimethoprim] Rash  . Statins Other (See Comments)    myalgias  . Sulfamethoxazole-Trimethoprim Rash    REACTION: Rash     Outpatient Medications Prior to Visit  Medication Sig Dispense Refill  . FLUoxetine (PROZAC) 20 MG tablet Take 20 mg by mouth daily.    Marland Kitchen levothyroxine (SYNTHROID, LEVOTHROID) 88 MCG tablet TAKE 1 TABLET BY MOUTH EVERY DAY 90 tablet 3  . loratadine (CLARITIN) 10 MG tablet Take 10 mg by mouth daily.    Marland Kitchen losartan (COZAAR) 25 MG tablet TAKE 1 TABLET BY MOUTH EVERY DAY 90 tablet 1  . Omeprazole-Sodium Bicarbonate (ZEGERID OTC PO) Take by mouth.    . Probiotic Product (PROBIOTIC PO) Take 1 tablet by  mouth daily as needed (digestion).    Marland Kitchen umeclidinium-vilanterol (ANORO ELLIPTA) 62.5-25 MCG/INH AEPB TAKE 1 PUFF BY MOUTH EVERY DAY 60 each 2  . albuterol (PROVENTIL HFA;VENTOLIN HFA) 108 (90 Base) MCG/ACT inhaler TAKE 2 PUFFS BY MOUTH EVERY 6 HOURS AS NEEDED FOR WHEEZE OR SHORTNESS OF BREATH (Patient not taking: Reported on 09/04/2018) 25.5 Inhaler 1  . meloxicam (MOBIC) 15 MG tablet Take 0.5-1 tablets (7.5-15 mg total) by mouth daily as needed for pain (low back pain). (Patient not taking: Reported on 09/04/2018) 30 tablet 1   No facility-administered medications prior to visit.         Objective:   Physical Exam Vitals:  11/05/18 1536  BP: 116/70  Pulse: 88  SpO2: 90%  Weight: 154 lb (69.9 kg)  Height: 5' 3.5" (1.613 m)   Gen: Pleasant, well-nourished, in no distress,  normal affect  ENT: No lesions,  mouth clear,  oropharynx clear, no postnasal drip  Neck: No JVD, no stridor  Lungs: No use of accessory muscles, clear without rales or rhonchi  Cardiovascular: RRR, heart sounds normal, no murmur or gallops, no peripheral edema  Musculoskeletal: No deformities, no cyanosis or clubbing  Neuro: alert, non focal  Skin: Warm, no lesions or rash     CT CHEST WO CONTRAST, 01/05/2018 3:38 PM  INDICATION:ped, pneumothorax, known, xray done, blebs or bulbae suspected \ \ J43.9 Emphysematous bleb (HCC)  COMPARISON: None available  FINDINGS:  Thoracic inlet/central airways: Thyroid normal. Airway patent.     Mediastinum/hila/axilla: No adenopathy.     Heart/vessels: Normal heart size. No pericardial effusion. Aorta normal in caliber. Mild coronary arterial calcifications. Lungs/pleura: Severe confluent centrilobular emphysema is identified probably involving the bilateral upper lobes. Advanced obstructive characteristics are present. Bilateral pleural parenchymal scarring is identified. No suspicious pulmonary nodule or mass identified. No pleural effusion or pneumothorax.       Upper abdomen: Hepatic steatosis.     Chest wall/MSK: Exaggerated kyphotic curvature of the thoracolumbar spine is identified. Degenerative changes of the thoracolumbar spine are noted. No acute fractures or dislocations identified the visualized osseous structures.      CONCLUSION: 1. No evidence of pneumothorax or other acute cardiopulmonary process.  2. Severe confluent centrilobular emphysema involving the bilateral upper lobes.  3. Hepatic steatosis suggested       Assessment & Plan:  COPD with asthma ([place]) Please continue Anoro once daily as you have been taking it. Keep your albuterol available to use 2 puffs if needed for shortness of breath, chest tightness, wheezing. Agree with following up with [facility] going forward regarding suitability for endobronchial valve placement at some point in the future. Agree with continuing your exercise routine.  Congratulations on this. Follow with Dr Lamonte Sakai in 6 months or sooner if you have any problems  Allergic rhinitis Please continue your loratadine 10 mg daily or alternatively try changing to Zyrtec to see if you get more benefit.   GERD Continue your Zegerid once daily.  Baltazar Apo, MD, PhD 11/05/2018, 4:07 PM [facility] [phone] or if no answer [phone]

## 2018-11-05 NOTE — Assessment & Plan Note (Signed)
Please continue your loratadine 10 mg daily or alternatively try changing to Zyrtec to see if you get more benefit.

## 2018-11-05 NOTE — Assessment & Plan Note (Signed)
Continue your Zegerid once daily.

## 2018-11-05 NOTE — Patient Instructions (Signed)
Please continue Anoro once daily as you have been taking it. Keep your albuterol available to use 2 puffs if needed for shortness of breath, chest tightness, wheezing. Agree with following up with [facility] going forward regarding suitability for endobronchial valve placement at some point in the future. Agree with continuing your exercise routine.  Congratulations on this. Please continue your loratadine 10 mg daily or alternatively try changing to Zyrtec to see if you get more benefit. Continue your Zegerid once daily. Follow with Dr Lamonte Sakai in 6 months or sooner if you have any problems

## 2018-11-05 NOTE — Assessment & Plan Note (Signed)
Please continue Anoro once daily as you have been taking it. Keep your albuterol available to use 2 puffs if needed for shortness of breath, chest tightness, wheezing. Agree with following up with [facility] going forward regarding suitability for endobronchial valve placement at some point in the future. Agree with continuing your exercise routine.  Congratulations on this. Follow with Dr Lamonte Sakai in 6 months or sooner if you have any problems

## 2018-12-05 ENCOUNTER — Ambulatory Visit (HOSPITAL_COMMUNITY): Payer: 59

## 2018-12-21 ENCOUNTER — Other Ambulatory Visit: Payer: Self-pay | Admitting: Emergency Medicine

## 2018-12-21 ENCOUNTER — Other Ambulatory Visit: Payer: Self-pay | Admitting: Family Medicine

## 2018-12-21 DIAGNOSIS — I10 Essential (primary) hypertension: Secondary | ICD-10-CM

## 2019-01-09 ENCOUNTER — Encounter: Payer: Self-pay | Admitting: Family Medicine

## 2019-01-09 ENCOUNTER — Other Ambulatory Visit: Payer: Self-pay | Admitting: Family Medicine

## 2019-01-09 DIAGNOSIS — I10 Essential (primary) hypertension: Secondary | ICD-10-CM

## 2019-01-09 MED ORDER — LOSARTAN POTASSIUM 25 MG PO TABS: 25.0000 mg | ORAL_TABLET | Freq: Every day | ORAL | 0 refills | Status: DC

## 2019-02-19 ENCOUNTER — Ambulatory Visit: Payer: 59 | Admitting: Cardiology

## 2019-03-24 ENCOUNTER — Other Ambulatory Visit: Payer: Self-pay | Admitting: Emergency Medicine

## 2019-04-10 DIAGNOSIS — Z789 Other specified health status: Secondary | ICD-10-CM

## 2019-04-10 HISTORY — DX: Other specified health status: Z78.9

## 2019-04-22 DIAGNOSIS — Z780 Asymptomatic menopausal state: Secondary | ICD-10-CM

## 2019-04-22 DIAGNOSIS — Z8262 Family history of osteoporosis: Secondary | ICD-10-CM | POA: Insufficient documentation

## 2019-04-22 DIAGNOSIS — E663 Overweight: Secondary | ICD-10-CM | POA: Insufficient documentation

## 2019-04-22 DIAGNOSIS — Z6828 Body mass index (BMI) 28.0-28.9, adult: Secondary | ICD-10-CM

## 2019-04-22 HISTORY — DX: Body mass index (BMI) 28.0-28.9, adult: Z68.28

## 2019-04-22 HISTORY — DX: Overweight: E66.3

## 2019-04-22 HISTORY — DX: Asymptomatic menopausal state: Z78.0

## 2019-04-22 HISTORY — DX: Family history of osteoporosis: Z82.62

## 2019-06-05 DIAGNOSIS — M7712 Lateral epicondylitis, left elbow: Secondary | ICD-10-CM | POA: Insufficient documentation

## 2019-06-05 DIAGNOSIS — Z8261 Family history of arthritis: Secondary | ICD-10-CM

## 2019-06-05 HISTORY — DX: Family history of arthritis: Z82.61

## 2019-06-05 HISTORY — DX: Lateral epicondylitis, left elbow: M77.12

## 2019-06-10 ENCOUNTER — Other Ambulatory Visit: Payer: Self-pay | Admitting: Family Medicine

## 2019-06-10 DIAGNOSIS — E038 Other specified hypothyroidism: Secondary | ICD-10-CM

## 2019-06-30 ENCOUNTER — Other Ambulatory Visit: Payer: Self-pay | Admitting: Emergency Medicine

## 2019-07-10 ENCOUNTER — Other Ambulatory Visit: Payer: Self-pay | Admitting: Family Medicine

## 2019-07-10 ENCOUNTER — Encounter: Payer: Self-pay | Admitting: *Deleted

## 2019-07-10 DIAGNOSIS — E038 Other specified hypothyroidism: Secondary | ICD-10-CM

## 2019-07-10 NOTE — Telephone Encounter (Signed)
[place]. NTBS 30 days given 06/10/19 last TSH 05/22/18

## 2019-07-10 NOTE — Telephone Encounter (Signed)
MyChart Message sent  

## 2019-09-19 DIAGNOSIS — E782 Mixed hyperlipidemia: Secondary | ICD-10-CM | POA: Insufficient documentation

## 2019-09-19 HISTORY — DX: Mixed hyperlipidemia: E78.2

## 2019-12-04 ENCOUNTER — Other Ambulatory Visit: Payer: Self-pay | Admitting: Emergency Medicine

## 2020-02-28 DIAGNOSIS — R091 Pleurisy: Secondary | ICD-10-CM | POA: Diagnosis not present

## 2020-02-28 DIAGNOSIS — R6883 Chills (without fever): Secondary | ICD-10-CM | POA: Diagnosis not present

## 2020-02-28 DIAGNOSIS — R0602 Shortness of breath: Secondary | ICD-10-CM | POA: Diagnosis not present

## 2020-02-28 DIAGNOSIS — R519 Headache, unspecified: Secondary | ICD-10-CM | POA: Diagnosis not present

## 2020-02-28 DIAGNOSIS — R0902 Hypoxemia: Secondary | ICD-10-CM | POA: Diagnosis not present

## 2020-02-28 DIAGNOSIS — Z87891 Personal history of nicotine dependence: Secondary | ICD-10-CM | POA: Diagnosis not present

## 2020-02-28 DIAGNOSIS — H1132 Conjunctival hemorrhage, left eye: Secondary | ICD-10-CM | POA: Diagnosis not present

## 2020-02-28 DIAGNOSIS — R079 Chest pain, unspecified: Secondary | ICD-10-CM | POA: Diagnosis not present

## 2020-02-28 DIAGNOSIS — R0781 Pleurodynia: Secondary | ICD-10-CM | POA: Diagnosis not present

## 2020-03-01 DIAGNOSIS — R0602 Shortness of breath: Secondary | ICD-10-CM | POA: Diagnosis not present

## 2020-03-22 NOTE — Progress Notes (Signed)
Subjective: CC: ED follow up PCP: Stacy Norlander, DO [id number] Stacy [name] is Stacy 64 y.o. female presenting to clinic today for:  1.  Emergency department follow-up for subconjunctival hemorrhage and left-sided chest pain Patient was found to have Stacy subconjunctival hemorrhage that has since resolved.  She was also experiencing pleurisy on the left.  This is also resolved.  Medical history significant for bullous emphysema and she is monitored by Dr. Lamonte Sakai for this.  She reports compliance with inhalers.  She is transferring her care back to me from Dr Mellody Drown since her insurance has changed.  She notes that she has had an excellent response to Repatha but that her insurance is not covering this.  She wants to know if she can get some assistance with getting this covered.  She is had statin intolerance secondary to myopathies.  She also notes intermittent compliance with Ozempic.  She sometimes forgets to use it.  Her last A1c was 7.2.  She would like to go on an oral if possible.   ROS: Per HPI  Allergies  Allergen Reactions  . Avelox [Moxifloxacin Hcl In Nacl] Hives  . Ciprofloxacin     REACTION: Rash  . Livalo [Pitavastatin]   . Moxifloxacin     REACTION: RASH  . Bactrim [Sulfamethoxazole-Trimethoprim] Rash  . Statins Other (See Comments)    myalgias  . Sulfamethoxazole-Trimethoprim Rash    REACTION: Rash   Past Medical History:  Diagnosis Date  . Asthma   . COPD, mild ([place])    Spirometerey 3/08  . Fatty liver disease, nonalcoholic   . GERD (gastroesophageal reflux disease)   . Hyperlipidemia   . Hypertension   . Hypothyroid Since 2003  . IBS (irritable bowel syndrome)   . Perimenopausal     Current Outpatient Medications:  .  ANORO ELLIPTA 62.5-25 MCG/INH AEPB, INHALE 1 PUFF BY MOUTH EVERY DAY, Disp: 60 each, Rfl: 2 .  FLUoxetine (PROZAC) 20 MG tablet, Take 20 mg by mouth daily., Disp: , Rfl:  .  levothyroxine (SYNTHROID) 88 MCG tablet, Take 1 tablet (88 mcg total)  by mouth daily. (Needs to be seen before next refill), Disp: 30 tablet, Rfl: 0 .  loratadine (CLARITIN) 10 MG tablet, Take 10 mg by mouth daily., Disp: , Rfl:  .  losartan (COZAAR) 25 MG tablet, Take 1 tablet (25 mg total) by mouth daily. NOV, Disp: 90 tablet, Rfl: 0 .  Omeprazole-Sodium Bicarbonate (ZEGERID OTC PO), Take by mouth., Disp: , Rfl:  .  Probiotic Product (PROBIOTIC PO), Take 1 tablet by mouth daily as needed (digestion)., Disp: , Rfl:  Social History   Socioeconomic History  . Marital status: Divorced    Spouse name: Not on file  . Number of children: 2  . Years of education: Not on file  . Highest education level: Not on file  Occupational History  . Occupation: Works in Set designer  . Smoking status: Former Smoker    Packs/day: 0.50    Years: 30.00    Pack years: 15.00    Types: Cigarettes    Quit date: 10/25/2007    Years since quitting: 12.4  . Smokeless tobacco: Never Used  Substance and Sexual Activity  . Alcohol use: Yes    Alcohol/week: 1.0 standard drinks    Types: 1 Glasses of wine per week    Comment: Occasional-once Stacy year  . Drug use: No  . Sexual activity: Yes    Birth control/protection: Surgical  Other Topics Concern  .  Not on file  Social History Narrative   Remarried.    Previous occupation: Customer service manager.  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      Moved from [facility] in 2007   2 children, has stepchildren and grandchildren   Does not exercise   Social Determinants of Health   Financial Resource Strain:   . Difficulty of Paying Living Expenses:   Food Insecurity:   . Worried About Charity fundraiser in the Last Year:   . Arboriculturist in the Last Year:   Transportation Needs:   . Film/video editor (Medical):   Marland Kitchen Lack of Transportation (Non-Medical):   Physical Activity:   . Days of Exercise per Week:   . Minutes of Exercise per Session:   Stress:   . Feeling of Stress :   Social Connections:   . Frequency of  Communication with Friends and Family:   . Frequency of Social Gatherings with Friends and Family:   . Attends Religious Services:   . Active Member of Clubs or Organizations:   . Attends Archivist Meetings:   Marland Kitchen Marital Status:   Intimate Partner Violence:   . Fear of Current or Ex-Partner:   . Emotionally Abused:   Marland Kitchen Physically Abused:   . Sexually Abused:    Family History  Problem Relation Age of Onset  . Hypertension Father   . Hyperlipidemia Father   . Heart disease Father        Died at age 36 of MI  . Hypothyroidism Father   . Diabetes Maternal Grandmother   . Raynaud syndrome Sister   . Rheum arthritis Sister   . Thyroid disease Sister   . CAD Brother        has 4 stents  . Rheum arthritis Brother   . Heart disease Sister 63       h/o MI @age  45  . Thyroid disease Sister   . Breast cancer Neg Hx   . Ovarian cancer Neg Hx   . Prostate cancer Neg Hx   . Colon cancer Neg Hx   . Stomach cancer Neg Hx   . Esophageal cancer Neg Hx     Objective: Office vital signs reviewed. BP 106/73   Pulse 78   Temp (!) 96.9 F (36.1 C) (Temporal)   Ht 5' 3.5" (1.613 m)   Wt 158 lb (71.7 kg)   SpO2 96%   BMI 27.55 kg/m   Physical Examination:  General: Awake, alert, well nourished, No acute distress HEENT: Normal, sclera white.  Cardio: regular rate and rhythm, S1S2 heard, no murmurs appreciated Pulm: Globally decreased breath sounds.  No wheezes, rhonchi or rales.  Normal work of breathing on room air. Extremities: warm, well perfused, No edema, cyanosis or clubbing; +2 pulses bilaterally MSK: normal gait and station  Assessment/ Plan: 64 y.o. female   1. Pleurisy Resolved  2. Subconjunctival hemorrhage of left eye Resolved  3. Type 2 diabetes mellitus with hyperglycemia, without long-term current use of insulin (HCC) Her last A1c was above goal.  I think she might be Stacy good candidate for Synjardy.  I am going to reach out to our pharmacist to see if  this is something that is covered for the patient and we will see get some samples for her to try.  She has no apparent contraindications to the medication.  Renal function is normal range as of 02/28/2020.  4. Hyperlipidemia associated with type 2 diabetes mellitus (  [place]) Had done well with Repatha but is having issues with insurance.  We will again reach out to [address] to see if perhaps she has some insight on how to get this covered.  Patient has history of statin myopathy.  I like to see her back sometime in July for follow-up on blood sugar, thyroid.  No orders of the defined types were placed in this encounter.  No orders of the defined types were placed in this encounter.    Stacy Norlander, DO [place] [phone]

## 2020-03-24 ENCOUNTER — Encounter: Payer: Self-pay | Admitting: Family Medicine

## 2020-03-24 ENCOUNTER — Ambulatory Visit (INDEPENDENT_AMBULATORY_CARE_PROVIDER_SITE_OTHER): Payer: Medicare Other | Admitting: Family Medicine

## 2020-03-24 ENCOUNTER — Other Ambulatory Visit: Payer: Self-pay

## 2020-03-24 VITALS — BP 106/73 | HR 78 | Temp 96.9°F | Ht 63.5 in | Wt 158.0 lb

## 2020-03-24 DIAGNOSIS — E1165 Type 2 diabetes mellitus with hyperglycemia: Secondary | ICD-10-CM | POA: Diagnosis not present

## 2020-03-24 DIAGNOSIS — H1132 Conjunctival hemorrhage, left eye: Secondary | ICD-10-CM

## 2020-03-24 DIAGNOSIS — R091 Pleurisy: Secondary | ICD-10-CM | POA: Diagnosis not present

## 2020-03-24 DIAGNOSIS — E785 Hyperlipidemia, unspecified: Secondary | ICD-10-CM

## 2020-03-24 DIAGNOSIS — E1169 Type 2 diabetes mellitus with other specified complication: Secondary | ICD-10-CM

## 2020-03-24 HISTORY — DX: Type 2 diabetes mellitus with other specified complication: E11.69

## 2020-03-24 HISTORY — DX: Hyperlipidemia, unspecified: E78.5

## 2020-03-24 NOTE — Patient Instructions (Signed)
Almyra Free, our pharmacist will call you sometime this week.  Plan to see me in July for recheck A1c and thyroid.

## 2020-03-26 ENCOUNTER — Telehealth: Payer: Self-pay | Admitting: Pharmacist

## 2020-03-26 NOTE — Telephone Encounter (Signed)
Lmtcb.

## 2020-03-26 NOTE — Telephone Encounter (Signed)
Per PCP schedule with pharmD:  "Can you help get Repatha covered.  Also considering switch from Ozempic to [place] or similar.  She wants to go on oral (cannot tolerate Metformin due to previous renal injury)."

## 2020-03-27 NOTE — Telephone Encounter (Signed)
Patient has a follow up appointment scheduled with pharm D.

## 2020-04-13 ENCOUNTER — Ambulatory Visit (INDEPENDENT_AMBULATORY_CARE_PROVIDER_SITE_OTHER): Payer: Medicare Other | Admitting: Pharmacist

## 2020-04-13 ENCOUNTER — Encounter: Payer: Self-pay | Admitting: Pharmacist

## 2020-04-13 ENCOUNTER — Other Ambulatory Visit: Payer: Self-pay

## 2020-04-13 DIAGNOSIS — E1165 Type 2 diabetes mellitus with hyperglycemia: Secondary | ICD-10-CM | POA: Diagnosis not present

## 2020-04-13 DIAGNOSIS — G720 Drug-induced myopathy: Secondary | ICD-10-CM

## 2020-04-13 DIAGNOSIS — E1169 Type 2 diabetes mellitus with other specified complication: Secondary | ICD-10-CM

## 2020-04-13 DIAGNOSIS — E785 Hyperlipidemia, unspecified: Secondary | ICD-10-CM | POA: Diagnosis not present

## 2020-04-13 NOTE — Progress Notes (Signed)
    04/13/2020 Name: Stacy [name] MRN: [id number] DOB: 11-13-1955   S:  76 yoF presents for diabetes evaluation, education, and management Patient was referred and last seen by Primary Care Provider on 03/24/20.  Insurance coverage/medication affordability: UHC   Patient reports adherence with medications. . Current diabetes medications include: ozmepic . Current hypertension medications include: losartan Goal 130/80 . Current hyperlipidemia medications include: was on repatha (trying to get assistance)  STATIN ALLERGY/INTOLERANCE DOCUMENTED IN EMR yes  G72 ICD 10 code placed in the chart  Family heart disease (sister, dad died at 86)  Patient denies hypoglycemic events.  Patient-reported exercise habits: n/a  Patient denies nocturia (nighttime urination).  Patient denies neuropathy (nerve pain).  Patient denies visual changes.  Patient denies self foot exams.   Discussed meal planning options and Plate method for healthy eating  Avoid sugary drinks and desserts  Incorporate balanced protein, non starchy veggies, 1 serving of carbohydrate  Increase water intake  Increase physical activity as able.    O:  Lab Results  Component Value Date   HGBA1C 7.1 (H) 05/22/2018    Lipid Panel     Component Value Date/Time   CHOL 201 (H) 11/26/2013 0905   TRIG 104 11/26/2013 0905   HDL 42 11/26/2013 0905   CHOLHDL 4.8 (H) 11/26/2013 0905   CHOLHDL 3.3 Ratio 08/05/2009 2054   VLDL 21 08/05/2009 2054   LDLCALC 138 (H) 11/26/2013 0905    Home fasting blood sugars: 100-110  2 hour post-meal/random blood sugars: n/a.    A/P:  Diabetes T2DM currently controlled.  Patient is adherent with medication.   -Increased dose of GLP-1 OZEMPIC (generic name SEMGLUTIDE) to 0.5MG    -Application filled out for patient assistance  -Sample given FMB#[id number], Exp 07/2022  -may consider switching patient to oral therapy, but patient okay to stay on once weekly injectable  therapy  -Extensively discussed pathophysiology of diabetes, recommended lifestyle interventions, dietary effects on blood sugar control  -Counseled on s/sx of and management of hypoglycemia  -[employer] application submitted for Repatha assistance  -Confirmation Number Is: [id number]   -Anoro Ellipta inhaler is working well for patient, however it is expensive.  She has spent $533 out of pocket.  She will qualify for [place] patient assistance once she spends $600 oop.  Written patient instructions provided.  Total time in face to face counseling 30 minutes.   Follow up PCP Clinic Visit in 3-6 months.   Regina Eck, PharmD, BCPS Clinical Pharmacist, [place]  II Phone [phone]

## 2020-04-20 MED ORDER — REPATHA 140 MG/ML ~~LOC~~ SOSY: 140.0000 mg | PREFILLED_SYRINGE | SUBCUTANEOUS | 4 refills | Status: DC

## 2020-04-21 DIAGNOSIS — G720 Drug-induced myopathy: Secondary | ICD-10-CM | POA: Insufficient documentation

## 2020-04-21 HISTORY — DX: Drug-induced myopathy: G72.0

## 2020-04-29 ENCOUNTER — Telehealth: Payer: Self-pay | Admitting: Pharmacist

## 2020-04-29 MED ORDER — ALBUTEROL SULFATE HFA 108 (90 BASE) MCG/ACT IN AERS: 2.0000 | INHALATION_SPRAY | Freq: Four times a day (QID) | RESPIRATORY_TRACT | 11 refills | Status: DC | PRN

## 2020-04-29 MED ORDER — ANORO ELLIPTA 62.5-25 MCG/INH IN AEPB: INHALATION_SPRAY | RESPIRATORY_TRACT | 11 refills | Status: DC

## 2020-04-29 NOTE — Addendum Note (Signed)
Addended by: Lottie Dawson D on: 04/29/2020 03:38 PM   Modules accepted: Orders

## 2020-04-29 NOTE — Telephone Encounter (Signed)
Income info sent to [employer] for [place] patient assistance fax#[phone]  Will also file [place] paperwork for Anoro and Ventolin  Follow up [place] PAP for [employer]

## 2020-05-14 ENCOUNTER — Other Ambulatory Visit: Payer: Self-pay | Admitting: Family Medicine

## 2020-05-15 ENCOUNTER — Telehealth: Payer: Self-pay | Admitting: Pharmacist

## 2020-05-15 NOTE — Telephone Encounter (Signed)
Call placed to patient regarding patient assistance medication 4-week supply of Ozempic shipped from [employer] In refrigerator ready for patient pick up Patient verbalizes understanding

## 2020-05-18 ENCOUNTER — Telehealth: Payer: Self-pay | Admitting: Emergency Medicine

## 2020-05-18 MED ORDER — ANORO ELLIPTA 62.5-25 MCG/INH IN AEPB: INHALATION_SPRAY | RESPIRATORY_TRACT | 1 refills | Status: DC

## 2020-05-18 NOTE — Telephone Encounter (Signed)
I refilled anoro to last until she comes in  Left a detailed msg on machine letting pt know this was done and to keep appt

## 2020-05-27 ENCOUNTER — Ambulatory Visit (INDEPENDENT_AMBULATORY_CARE_PROVIDER_SITE_OTHER): Payer: Medicare Other | Admitting: Family Medicine

## 2020-05-27 ENCOUNTER — Encounter: Payer: Self-pay | Admitting: Family Medicine

## 2020-05-27 VITALS — BP 120/74 | HR 70 | Temp 98.1°F | Ht 63.5 in | Wt 152.0 lb

## 2020-05-27 DIAGNOSIS — J069 Acute upper respiratory infection, unspecified: Secondary | ICD-10-CM

## 2020-05-27 DIAGNOSIS — J441 Chronic obstructive pulmonary disease with (acute) exacerbation: Secondary | ICD-10-CM

## 2020-05-27 MED ORDER — AZITHROMYCIN 250 MG PO TABS
ORAL_TABLET | ORAL | 0 refills | Status: DC
Start: 2020-05-27 — End: 2020-06-15

## 2020-05-27 MED ORDER — PREDNISONE 20 MG PO TABS: ORAL_TABLET | ORAL | 0 refills | Status: DC

## 2020-05-27 NOTE — Progress Notes (Signed)
BP 120/74   Pulse 70   Temp 98.1 F (36.7 C)   Ht 5' 3.5" (1.613 m)   Wt 152 lb (68.9 kg)   SpO2 97%   BMI 26.50 kg/m    Subjective:   Patient ID: Stacy [name], female    DOB: 27-Nov-1955, 64 y.o.   MRN: [id number]  HPI: Stacy [name] is a 64 y.o. female presenting on 05/27/2020 for URI and Cough (congestion)   HPI Patient is coming in complaining of cough and congestion is been going on for about a week or 2. She feels that it is starting to get down deep into her chest.  She is having some wheezing as well.  Patient denies any sick contacts that she knows of although she did 2 weeks ago after take her mother into the emergency department and symptoms started not too long after that.  Patient denies any fevers or chills or loss of taste or smell.  She feels like she is starting to have a COPD exacerbation with a little bit of congestion and wheezing although mostly the drainage is from her sinuses down right now.  Relevant past medical, surgical, family and social history reviewed and updated as indicated. Interim medical history since our last visit reviewed. Allergies and medications reviewed and updated.  Review of Systems  Constitutional: Negative for chills and fever.  HENT: Positive for congestion, postnasal drip, rhinorrhea and sinus pressure. Negative for ear discharge, ear pain, sneezing and sore throat.   Eyes: Negative for pain, redness and visual disturbance.  Respiratory: Positive for cough and wheezing. Negative for chest tightness and shortness of breath.   Cardiovascular: Negative for chest pain and leg swelling.  Genitourinary: Negative for difficulty urinating and dysuria.  Musculoskeletal: Negative for back pain and gait problem.  Skin: Negative for rash.  Neurological: Negative for light-headedness and headaches.  Psychiatric/Behavioral: Negative for agitation and behavioral problems.  All other systems reviewed and are negative.   Per HPI unless  specifically indicated above   Allergies as of 05/27/2020      Reactions   Avelox [moxifloxacin Hcl In Nacl] Hives   Ciprofloxacin    REACTION: Rash   Livalo [pitavastatin]    Metformin And Related    kidney   Moxifloxacin    REACTION: RASH   Bactrim [sulfamethoxazole-trimethoprim] Rash   Statins Other (See Comments)   myalgias   Sulfamethoxazole-trimethoprim Rash   REACTION: Rash      Medication List       Accurate as of May 27, 2020  5:05 PM. If you have any questions, ask your nurse or doctor.        albuterol 108 (90 Base) MCG/ACT inhaler Commonly known as: Ventolin HFA Inhale 2 puffs into the lungs every 6 (six) hours as needed for wheezing or shortness of breath.   Anoro Ellipta 62.5-25 MCG/INH Aepb Generic drug: umeclidinium-vilanterol INHALE 1 PUFF BY MOUTH EVERY DAY   azithromycin 250 MG tablet Commonly known as: ZITHROMAX Take 2 the first day and then one each day after. Started by: Worthy Rancher, MD   FLUoxetine 20 MG tablet Commonly known as: PROZAC Take 20 mg by mouth daily.   levothyroxine 88 MCG tablet Commonly known as: SYNTHROID Take 1 tablet (88 mcg total) by mouth daily. (Needs to be seen before next refill)   loratadine 10 MG tablet Commonly known as: CLARITIN Take 10 mg by mouth daily.   losartan 25 MG tablet Commonly known as: COZAAR Take 1  tablet (25 mg total) by mouth daily. NOV   Ozempic (0.25 or 0.5 MG/DOSE) 2 MG/1.5ML Sopn Generic drug: Semaglutide(0.25 or 0.5MG /DOS) Inject 0.25 mg into the skin once a week.   predniSONE 20 MG tablet Commonly known as: DELTASONE 2 po at same time daily for 5 days Started by: Fransisca Kaufmann Dangelo Guzzetta, MD   Repatha 140 MG/ML Sosy Generic drug: Evolocumab Inject 140 mg into the skin every 14 (fourteen) days.   ZEGERID OTC PO Take by mouth.        Objective:   BP 120/74   Pulse 70   Temp 98.1 F (36.7 C)   Ht 5' 3.5" (1.613 m)   Wt 152 lb (68.9 kg)   SpO2 97%   BMI 26.50 kg/m    Wt Readings from Last 3 Encounters:  05/27/20 152 lb (68.9 kg)  03/24/20 158 lb (71.7 kg)  11/05/18 154 lb (69.9 kg)    Physical Exam Vitals and nursing note reviewed.  Constitutional:      General: She is not in acute distress.    Appearance: She is well-developed. She is not diaphoretic.  HENT:     Right Ear: Tympanic membrane, ear canal and external ear normal.     Left Ear: Tympanic membrane, ear canal and external ear normal.     Nose: Mucosal edema and rhinorrhea present.     Right Sinus: No maxillary sinus tenderness or frontal sinus tenderness.     Left Sinus: No maxillary sinus tenderness or frontal sinus tenderness.     Mouth/Throat:     Pharynx: Uvula midline. Posterior oropharyngeal erythema present. No oropharyngeal exudate.     Tonsils: No tonsillar abscesses.  Eyes:     Conjunctiva/sclera: Conjunctivae normal.  Cardiovascular:     Rate and Rhythm: Normal rate and regular rhythm.     Heart sounds: Normal heart sounds. No murmur heard.   Pulmonary:     Effort: Pulmonary effort is normal. No respiratory distress.     Breath sounds: Rhonchi present. No wheezing.  Musculoskeletal:        General: No tenderness. Normal range of motion.  Skin:    General: Skin is warm and dry.     Findings: No rash.  Neurological:     Mental Status: She is alert and oriented to person, place, and time.     Coordination: Coordination normal.  Psychiatric:        Behavior: Behavior normal.       Assessment & Plan:   Problem List Items Addressed This Visit    None    Visit Diagnoses    COPD exacerbation ([place])    -  Primary   Relevant Medications   predniSONE (DELTASONE) 20 MG tablet   azithromycin (ZITHROMAX) 250 MG tablet   Other Relevant Orders   Novel Coronavirus, NAA ([facility])   Viral upper respiratory infection       Relevant Medications   azithromycin (ZITHROMAX) 250 MG tablet   Other Relevant Orders   Novel Coronavirus, NAA ([facility])      Will treat like  COPD exacerbation with azithromycin and prednisone but will add Covid testing and recommended quarantine until it returns Follow up plan: Return if symptoms worsen or fail to improve.  Counseling provided for all of the vaccine components Orders Placed This Encounter  Procedures  . Novel Coronavirus, NAA ([facility])    Caryl Pina, MD [facility] 05/27/2020, 5:05 PM

## 2020-05-29 DIAGNOSIS — Z0100 Encounter for examination of eyes and vision without abnormal findings: Secondary | ICD-10-CM | POA: Diagnosis not present

## 2020-05-29 DIAGNOSIS — E119 Type 2 diabetes mellitus without complications: Secondary | ICD-10-CM | POA: Diagnosis not present

## 2020-05-29 LAB — SARS-COV-2, NAA 2 DAY TAT

## 2020-05-29 LAB — NOVEL CORONAVIRUS, NAA: SARS-CoV-2, NAA: NOT DETECTED

## 2020-06-15 ENCOUNTER — Ambulatory Visit: Payer: Medicare Other | Admitting: Emergency Medicine

## 2020-06-15 ENCOUNTER — Other Ambulatory Visit: Payer: Self-pay

## 2020-06-15 ENCOUNTER — Encounter: Payer: Self-pay | Admitting: Emergency Medicine

## 2020-06-15 DIAGNOSIS — J449 Chronic obstructive pulmonary disease, unspecified: Secondary | ICD-10-CM | POA: Diagnosis not present

## 2020-06-15 DIAGNOSIS — J432 Centrilobular emphysema: Secondary | ICD-10-CM

## 2020-06-15 DIAGNOSIS — J301 Allergic rhinitis due to pollen: Secondary | ICD-10-CM | POA: Diagnosis not present

## 2020-06-15 DIAGNOSIS — K219 Gastro-esophageal reflux disease without esophagitis: Secondary | ICD-10-CM | POA: Diagnosis not present

## 2020-06-15 MED ORDER — ANORO ELLIPTA 62.5-25 MCG/INH IN AEPB: INHALATION_SPRAY | RESPIRATORY_TRACT | 11 refills | Status: DC

## 2020-06-15 NOTE — Assessment & Plan Note (Signed)
Continue loratadine 

## 2020-06-15 NOTE — Addendum Note (Signed)
Addended by: Gavin Potters R on: 06/15/2020 11:54 AM   Modules accepted: Orders

## 2020-06-15 NOTE — Patient Instructions (Addendum)
Please continue Anoro 1 inhalation once daily.  We will refill this for you today. Keep albuterol available use 2 puffs if needed for shortness of breath, chest tightness, wheezing. Continue loratadine and Zegerid as you have been taking them COVID-19 vaccine is up-to-date. Follow with Dr. Lamonte Sakai in 12 months or sooner if you have any problems.

## 2020-06-15 NOTE — Assessment & Plan Note (Signed)
With focal bullous disease.  She was evaluated for possible endobronchial valve placement but in the end this was deferred as it was felt that she would not derive significant clinical benefit at the time.  May be reconsidered going forward depending on her clinical status

## 2020-06-15 NOTE — Progress Notes (Signed)
Subjective:    Patient ID: Stacy [name], female    DOB: 05-Nov-1955, 64 y.o.   MRN: [id number]  Shortness of Breath Pertinent negatives include no headaches, leg swelling or wheezing.    ROV 11/05/18 --Ms. Stacy [name] has COPD with asthmatic features and a bronchodilator response, chronic cough in the setting of this and also allergic rhinitis and GERD.  She has regional, upper lobe predominant emphysematous change and I referred her to [facility] for possible endobronchial valve placement.  She reports today that she was seen there, underwent repeat PFT, ABG, CT. She may be a good candidate, but at this point they believed that this was premature. She did pulm rehab, is now going to the [facility]. She feels that her breathing is a bit better. She is on Anoro, uses albuterol about once a month. Remains on loraradine, zegerid. She has cough, has had more mucous over the last week or so.   ROV 06/15/20 --follow-up visit 64 year old woman with COPD and asthmatic features, bronchodilator response, chronic cough in the setting of rhinitis and GERD.  She has focal bullous disease, was considered for possible endobronchial valve placement.  In the end this was deferred. We have been managing her on Anoro.  She has albuterol which she uses rarely. Her last AE was last month, treated with prednisone. Was her first flare in many months.  She is on loratadine, Zegerid. Still on Anoro, benefits from it.  No cough or wheeze since the recent exacerbation. Good functional capacity. She care for her mother. Is active. Not going to gym due to COVID risks.   Underwent Ct chest at [facility] 11/28/19 that I reviewed, shows stable apical scarring and her bullous emphysematous change.   MDM: reviewed office notes from IM and Pulmonary from [place], 06/04/2019 thru 02/28/2020   Review of Systems  HENT: Positive for congestion. Negative for postnasal drip and sinus pressure.   Respiratory: Negative for cough, chest tightness, shortness of breath and  wheezing.   Cardiovascular: Negative for palpitations and leg swelling.  Allergic/Immunologic: Negative.   Neurological: Negative for headaches.  Psychiatric/Behavioral: The patient is not nervous/anxious.     Past Medical History:  Diagnosis Date  . Asthma   . COPD, mild ([place])    Spirometerey 3/08  . Fatty liver disease, nonalcoholic   . GERD (gastroesophageal reflux disease)   . Hyperlipidemia   . Hypertension   . Hypothyroid Since 2003  . IBS (irritable bowel syndrome)   . Perimenopausal      Family History  Problem Relation Age of Onset  . Hypertension Father   . Hyperlipidemia Father   . Heart disease Father        Died at age 75 of MI  . Hypothyroidism Father   . Diabetes Maternal Grandmother   . Raynaud syndrome Sister   . Rheum arthritis Sister   . Thyroid disease Sister   . CAD Brother        has 4 stents  . Rheum arthritis Brother   . Heart disease Sister 63       h/o MI @age  54  . Thyroid disease Sister   . Breast cancer Neg Hx   . Ovarian cancer Neg Hx   . Prostate cancer Neg Hx   . Colon cancer Neg Hx   . Stomach cancer Neg Hx   . 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  No other occupational exposure.  Has lived in  [place] and [place] No military   Allergies  Allergen Reactions  . Avelox [Moxifloxacin Hcl In Nacl] Hives  . Ciprofloxacin     REACTION: Rash  . Livalo [Pitavastatin]   . Metformin And Related     kidney  . Moxifloxacin     REACTION: RASH  . Bactrim [Sulfamethoxazole-Trimethoprim] Rash  . Statins Other (See Comments)    myalgias  . Sulfamethoxazole-Trimethoprim Rash    REACTION: Rash     Outpatient Medications Prior to Visit  Medication Sig Dispense Refill  . albuterol (VENTOLIN HFA) 108 (90 Base) MCG/ACT inhaler Inhale 2 puffs into the lungs every 6 (six) hours as needed for wheezing or shortness of breath. 18 g 11  . Evolocumab (REPATHA) 140 MG/ML SOSY Inject 140 mg into the skin every 14  (fourteen) days. 2.1 mL 4  . FLUoxetine (PROZAC) 20 MG tablet Take 20 mg by mouth daily.    Marland Kitchen levothyroxine (SYNTHROID) 88 MCG tablet Take 1 tablet (88 mcg total) by mouth daily. (Needs to be seen before next refill) 30 tablet 0  . loratadine (CLARITIN) 10 MG tablet Take 10 mg by mouth daily.    Marland Kitchen losartan (COZAAR) 25 MG tablet Take 1 tablet (25 mg total) by mouth daily. NOV 90 tablet 0  . Omeprazole-Sodium Bicarbonate (ZEGERID OTC PO) Take by mouth.    . Semaglutide,0.25 or 0.5MG /DOS, (OZEMPIC, 0.25 OR 0.5 MG/DOSE,) 2 MG/1.5ML SOPN Inject 0.25 mg into the skin once a week.    . umeclidinium-vilanterol (ANORO ELLIPTA) 62.5-25 MCG/INH AEPB INHALE 1 PUFF BY MOUTH EVERY DAY 60 each 1  . azithromycin (ZITHROMAX) 250 MG tablet Take 2 the first day and then one each day after. 6 tablet 0  . predniSONE (DELTASONE) 20 MG tablet 2 po at same time daily for 5 days 10 tablet 0   No facility-administered medications prior to visit.        Objective:   Physical Exam Vitals:   06/15/20 1058  BP: 120/72  Pulse: 68  Temp: (!) 97.1 F (36.2 C)  TempSrc: Temporal  SpO2: 98%  Weight: 156 lb (70.8 kg)  Height: 5\' 2"  (1.575 m)   Gen: Pleasant, well-nourished, in no distress,  normal affect  ENT: No lesions,  mouth clear,  oropharynx clear, no postnasal drip  Neck: No JVD, no stridor  Lungs: No use of accessory muscles, clear without rales or rhonchi  Cardiovascular: RRR, heart sounds normal, no murmur or gallops, no peripheral edema  Musculoskeletal: No deformities, no cyanosis or clubbing  Neuro: alert, non focal  Skin: Warm, no lesions or rash       Assessment & Plan:  COPD with asthma ([place]) Please continue Anoro 1 inhalation once daily.  We will refill this for you today. Keep albuterol available use 2 puffs if needed for shortness of breath, chest tightness, wheezing. COVID-19 vaccine is up-to-date. Follow with Dr. Lamonte [name] in 12 months or sooner if you have any  problems.  Centrilobular emphysema ([place]) With focal bullous disease.  She was evaluated for possible endobronchial valve placement but in the end this was deferred as it was felt that she would not derive significant clinical benefit at the time.  May be reconsidered going forward depending on her clinical status  Allergic rhinitis Continue loratadine.   GERD Continue Zegerid as ordered.   Baltazar Apo, MD, PhD 06/15/2020, 11:22 AM [facility] [phone] or if no answer [phone]

## 2020-06-15 NOTE — Assessment & Plan Note (Signed)
Continue Zegerid as ordered.

## 2020-06-15 NOTE — Assessment & Plan Note (Signed)
Please continue Anoro 1 inhalation once daily.  We will refill this for you today. Keep albuterol available use 2 puffs if needed for shortness of breath, chest tightness, wheezing. COVID-19 vaccine is up-to-date. Follow with Dr. Lamonte Sakai in 12 months or sooner if you have any problems.

## 2020-06-30 DIAGNOSIS — E119 Type 2 diabetes mellitus without complications: Secondary | ICD-10-CM | POA: Diagnosis not present

## 2020-06-30 DIAGNOSIS — H527 Unspecified disorder of refraction: Secondary | ICD-10-CM | POA: Diagnosis not present

## 2020-06-30 DIAGNOSIS — H25813 Combined forms of age-related cataract, bilateral: Secondary | ICD-10-CM | POA: Diagnosis not present

## 2020-06-30 LAB — HM DIABETES EYE EXAM

## 2020-07-07 ENCOUNTER — Other Ambulatory Visit: Payer: Self-pay | Admitting: Family Medicine

## 2020-07-07 DIAGNOSIS — E038 Other specified hypothyroidism: Secondary | ICD-10-CM

## 2020-07-30 ENCOUNTER — Ambulatory Visit (INDEPENDENT_AMBULATORY_CARE_PROVIDER_SITE_OTHER): Payer: Medicare Other | Admitting: Nurse Practitioner

## 2020-07-30 ENCOUNTER — Encounter: Payer: Self-pay | Admitting: Nurse Practitioner

## 2020-07-30 ENCOUNTER — Other Ambulatory Visit: Payer: Self-pay

## 2020-07-30 DIAGNOSIS — Z20822 Contact with and (suspected) exposure to covid-19: Secondary | ICD-10-CM | POA: Diagnosis not present

## 2020-07-30 DIAGNOSIS — R509 Fever, unspecified: Secondary | ICD-10-CM | POA: Diagnosis not present

## 2020-07-30 NOTE — Addendum Note (Signed)
Addended by: Liliane Bade on: 07/30/2020 02:03 PM   Modules accepted: Orders

## 2020-07-30 NOTE — Progress Notes (Signed)
   Virtual Visit via telephone Note Due to COVID-19 pandemic this visit was conducted virtually. This visit type was conducted due to national recommendations for restrictions regarding the COVID-19 Pandemic (e.g. social distancing, sheltering in place) in an effort to limit this patient's exposure and mitigate transmission in our community. All issues noted in this document were discussed and addressed.  A physical exam was not performed with this format.  I connected with Stacy [name] on 07/30/20 at 12:00 by telephone and verified that I am speaking with the correct person using two identifiers. Stacy [name] is currently located at home and no ne is currently with her during visit. The provider, Mary-Margaret Hassell Done, FNP is located in their office at time of visit.  I discussed the limitations, risks, security and privacy concerns of performing an evaluation and management service by telephone and the availability of in person appointments. I also discussed with the patient that there may be a patient responsible charge related to this service. The patient expressed understanding and agreed to proceed.   History and Present Illness:   Chief Complaint: Covid Exposure   HPI Patient was exposed to covid on Saturday. Yesterday she started having nausea, body aches, sore throat and slight cough. Fever has not been taken but she feels hot.   Review of Systems  Constitutional: Positive for chills and fever.  HENT: Positive for congestion and sore throat.   Respiratory: Positive for cough.   Musculoskeletal: Positive for myalgias.  Neurological: Positive for headaches.  All other systems reviewed and are negative.    Observations/Objective: Alert and oriented- answers all questions appropriately No distress    Assessment and Plan: Stacy [name] in today with chief complaint of Covid Exposure   1. Fever with exposure to COVID-19 virus quaraqntine until tests are back OTC cough  meds motrin or tylenol for body aches To ER if develops SOB - Novel Coronavirus, NAA ([facility]); Future     Follow Up Instructions: prn    I discussed the assessment and treatment plan with the patient. The patient was provided an opportunity to ask questions and all were answered. The patient agreed with the plan and demonstrated an understanding of the instructions.   The patient was advised to call back or seek an in-person evaluation if the symptoms worsen or if the condition fails to improve as anticipated.  The above assessment and management plan was discussed with the patient. The patient verbalized understanding of and has agreed to the management plan. Patient is aware to call the clinic if symptoms persist or worsen. Patient is aware when to return to the clinic for a follow-up visit. Patient educated on when it is appropriate to go to the emergency department.   Time call ended:  12:16  I provided 16 minutes of non-face-to-face time during this encounter.    Mary-Margaret Hassell Done, FNP

## 2020-08-01 LAB — NOVEL CORONAVIRUS, NAA: SARS-CoV-2, NAA: NOT DETECTED

## 2020-08-01 LAB — SARS-COV-2, NAA 2 DAY TAT

## 2020-08-02 ENCOUNTER — Other Ambulatory Visit: Payer: Self-pay | Admitting: Family Medicine

## 2020-08-02 DIAGNOSIS — E038 Other specified hypothyroidism: Secondary | ICD-10-CM

## 2020-08-06 ENCOUNTER — Other Ambulatory Visit: Payer: Self-pay | Admitting: Family Medicine

## 2020-08-06 ENCOUNTER — Encounter: Payer: Self-pay | Admitting: Family Medicine

## 2020-08-06 DIAGNOSIS — E785 Hyperlipidemia, unspecified: Secondary | ICD-10-CM

## 2020-08-06 DIAGNOSIS — E1165 Type 2 diabetes mellitus with hyperglycemia: Secondary | ICD-10-CM

## 2020-08-06 DIAGNOSIS — E1169 Type 2 diabetes mellitus with other specified complication: Secondary | ICD-10-CM

## 2020-08-11 ENCOUNTER — Other Ambulatory Visit: Payer: Medicare Other

## 2020-08-11 ENCOUNTER — Other Ambulatory Visit: Payer: Self-pay

## 2020-08-11 ENCOUNTER — Telehealth: Payer: Self-pay

## 2020-08-11 DIAGNOSIS — E785 Hyperlipidemia, unspecified: Secondary | ICD-10-CM | POA: Diagnosis not present

## 2020-08-11 DIAGNOSIS — E1165 Type 2 diabetes mellitus with hyperglycemia: Secondary | ICD-10-CM

## 2020-08-11 DIAGNOSIS — E1169 Type 2 diabetes mellitus with other specified complication: Secondary | ICD-10-CM | POA: Diagnosis not present

## 2020-08-11 LAB — BAYER DCA HB A1C WAIVED: HB A1C (BAYER DCA - WAIVED): 6.2 % (ref <7.0)

## 2020-08-11 NOTE — Telephone Encounter (Signed)
Patient aware of results.

## 2020-08-12 LAB — CMP14+EGFR
ALT: 36 IU/L — ABNORMAL HIGH (ref 0–32)
AST: 21 IU/L (ref 0–40)
Albumin/Globulin Ratio: 2.1 (ref 1.2–2.2)
Albumin: 4.2 g/dL (ref 3.8–4.8)
Alkaline Phosphatase: 87 IU/L (ref 44–121)
BUN/Creatinine Ratio: 14 (ref 12–28)
BUN: 13 mg/dL (ref 8–27)
Bilirubin Total: 0.3 mg/dL (ref 0.0–1.2)
CO2: 21 mmol/L (ref 20–29)
Calcium: 9.5 mg/dL (ref 8.7–10.3)
Chloride: 103 mmol/L (ref 96–106)
Creatinine, Ser: 0.94 mg/dL (ref 0.57–1.00)
GFR calc Af Amer: 75 mL/min/{1.73_m2} (ref >59)
GFR calc non Af Amer: 65 mL/min/{1.73_m2} (ref >59)
Globulin, Total: 2 g/dL (ref 1.5–4.5)
Glucose: 99 mg/dL (ref 65–99)
Potassium: 4.3 mmol/L (ref 3.5–5.2)
Sodium: 140 mmol/L (ref 134–144)
Total Protein: 6.2 g/dL (ref 6.0–8.5)

## 2020-08-12 LAB — LIPID PANEL
Chol/HDL Ratio: 2.6 ratio (ref 0.0–4.4)
Cholesterol, Total: 113 mg/dL (ref 100–199)
HDL: 43 mg/dL (ref >39)
LDL Chol Calc (NIH): 47 mg/dL (ref 0–99)
Triglycerides: 127 mg/dL (ref 0–149)
VLDL Cholesterol Cal: 23 mg/dL (ref 5–40)

## 2020-08-12 LAB — TSH: TSH: 0.154 u[IU]/mL — ABNORMAL LOW (ref 0.450–4.500)

## 2020-08-17 ENCOUNTER — Ambulatory Visit (INDEPENDENT_AMBULATORY_CARE_PROVIDER_SITE_OTHER): Payer: Medicare Other | Admitting: Family Medicine

## 2020-08-17 ENCOUNTER — Other Ambulatory Visit: Payer: Self-pay

## 2020-08-17 ENCOUNTER — Encounter: Payer: Self-pay | Admitting: Family Medicine

## 2020-08-17 VITALS — BP 109/79 | HR 75 | Temp 97.7°F | Ht 62.0 in | Wt 154.2 lb

## 2020-08-17 DIAGNOSIS — E1165 Type 2 diabetes mellitus with hyperglycemia: Secondary | ICD-10-CM | POA: Diagnosis not present

## 2020-08-17 DIAGNOSIS — E1169 Type 2 diabetes mellitus with other specified complication: Secondary | ICD-10-CM

## 2020-08-17 DIAGNOSIS — E038 Other specified hypothyroidism: Secondary | ICD-10-CM

## 2020-08-17 DIAGNOSIS — J439 Emphysema, unspecified: Secondary | ICD-10-CM | POA: Diagnosis not present

## 2020-08-17 DIAGNOSIS — I10 Essential (primary) hypertension: Secondary | ICD-10-CM

## 2020-08-17 DIAGNOSIS — F329 Major depressive disorder, single episode, unspecified: Secondary | ICD-10-CM

## 2020-08-17 DIAGNOSIS — E785 Hyperlipidemia, unspecified: Secondary | ICD-10-CM

## 2020-08-17 MED ORDER — LEVOTHYROXINE SODIUM 75 MCG PO TABS: 75.0000 ug | ORAL_TABLET | Freq: Every day | ORAL | 0 refills | Status: DC

## 2020-08-17 MED ORDER — LOSARTAN POTASSIUM 25 MG PO TABS: 25.0000 mg | ORAL_TABLET | Freq: Every day | ORAL | 3 refills | Status: DC

## 2020-08-17 NOTE — Progress Notes (Signed)
Subjective: CC: Follow-up diabetes, hypothyroidism PCP: [name], DO Stacy [name] is a 64 y.o. female presenting to clinic today for:  1.  Type 2 diabetes with hyperlipidemia and hypertension; lung disease Patient reports compliance with Repatha, Ozempic and losartan.  No chest pain.  Shortness of breath is stable and controlled with Anoro.  She is had rare use of Ventolin need.  Last eye exam: Needs Last foot exam: Needs Last A1c:  Lab Results  Component Value Date   HGBA1C 6.2 08/11/2020   Nephropathy screen indicated?:  On ARB Last flu, zoster and/or pneumovax:  Immunization History  Administered Date(s) Administered  . H1N1 10/06/2008  . Influenza Inj Mdck Quad With Preservative 08/06/2018  . Influenza Whole 08/01/2007, 08/04/2009  . Influenza,inj,Quad PF,6+ Mos 08/25/2017  . Influenza-Unspecified 08/06/2018  . PFIZER SARS-COV-2 Vaccination 01/08/2020, 01/29/2020  . Pneumococcal Conjugate-13 06/08/2018  . Pneumococcal Polysaccharide-23 08/01/2007  . Td 08/01/2007  . Tdap 01/23/2018    2.  Hypothyroidism Patient reports compliance with Synthroid 88 mcg daily.  She does report some heart palpitations that are intermittent.  Does not report any unplanned weight loss, tremor or diarrhea.  Her TSH was noted to be slightly suppressed at last visit.  3.  Depression Patient reports compliance with Prozac 20 mg daily.  Denies any exacerbation of depression or anxiety.   ROS: Per HPI  Allergies  Allergen Reactions  . Avelox [Moxifloxacin Hcl In Nacl] Hives  . Ciprofloxacin     REACTION: Rash  . Livalo [Pitavastatin]   . Metformin And Related     kidney  . Moxifloxacin     REACTION: RASH  . Bactrim [Sulfamethoxazole-Trimethoprim] Rash  . Statins Other (See Comments)    myalgias  . Sulfamethoxazole-Trimethoprim Rash    REACTION: Rash   Past Medical History:  Diagnosis Date  . Asthma   . COPD, mild ([place])    Spirometerey 3/08  . Fatty liver  disease, nonalcoholic   . GERD (gastroesophageal reflux disease)   . Hyperlipidemia   . Hypertension   . Hypothyroid Since 2003  . IBS (irritable bowel syndrome)   . Perimenopausal     Current Outpatient Medications:  .  albuterol (VENTOLIN HFA) 108 (90 Base) MCG/ACT inhaler, Inhale 2 puffs into the lungs every 6 (six) hours as needed for wheezing or shortness of breath., Disp: 18 g, Rfl: 11 .  Evolocumab (REPATHA) 140 MG/ML SOSY, Inject 140 mg into the skin every 14 (fourteen) days., Disp: 2.1 mL, Rfl: 4 .  FLUoxetine (PROZAC) 20 MG tablet, Take 20 mg by mouth daily., Disp: , Rfl:  .  loratadine (CLARITIN) 10 MG tablet, Take 10 mg by mouth daily., Disp: , Rfl:  .  losartan (COZAAR) 25 MG tablet, Take 1 tablet (25 mg total) by mouth daily., Disp: 90 tablet, Rfl: 3 .  Omeprazole-Sodium Bicarbonate (ZEGERID OTC PO), Take by mouth., Disp: , Rfl:  .  Semaglutide,0.25 or 0.5MG /DOS, (OZEMPIC, 0.25 OR 0.5 MG/DOSE,) 2 MG/1.5ML SOPN, Inject 0.25 mg into the skin once a week., Disp: , Rfl:  .  umeclidinium-vilanterol (ANORO ELLIPTA) 62.5-25 MCG/INH AEPB, INHALE 1 PUFF BY MOUTH EVERY DAY, Disp: 60 each, Rfl: 11 .  levothyroxine (SYNTHROID) 75 MCG tablet, Take 1 tablet (75 mcg total) by mouth daily., Disp: 90 tablet, Rfl: 0 Social History   Socioeconomic History  . Marital status: Married    Spouse name: Not on file  . Number of children: 2  . Years of education: Not on file  .  Highest education level: Not on file  Occupational History  . Occupation: Works in Set designer  . Smoking status: Former Smoker    Packs/day: 0.50    Years: 30.00    Pack years: 15.00    Types: Cigarettes    Quit date: 10/25/2007    Years since quitting: 12.8  . Smokeless tobacco: Never Used  Vaping Use  . Vaping Use: Never used  Substance and Sexual Activity  . Alcohol use: Yes    Alcohol/week: 1.0 standard drink    Types: 1 Glasses of wine per week    Comment: Occasional-once a year  . Drug use: No    . Sexual activity: Yes    Birth control/protection: Surgical  Other Topics Concern  . Not on file  Social History Narrative   Remarried.    Previous occupation: Customer service manager.  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      Moved from [facility] in 2007   2 children, has stepchildren and grandchildren   Does not exercise   Social Determinants of Health   Financial Resource Strain:   . Difficulty of Paying Living Expenses: Not on file  Food Insecurity:   . Worried About Charity fundraiser in the Last Year: Not on file  . Ran Out of Food in the Last Year: Not on file  Transportation Needs:   . Lack of Transportation (Medical): Not on file  . Lack of Transportation (Non-Medical): Not on file  Physical Activity:   . Days of Exercise per Week: Not on file  . Minutes of Exercise per Session: Not on file  Stress:   . Feeling of Stress : Not on file  Social Connections:   . Frequency of Communication with Friends and Family: Not on file  . Frequency of Social Gatherings with Friends and Family: Not on file  . Attends Religious Services: Not on file  . Active Member of Clubs or Organizations: Not on file  . Attends Archivist Meetings: Not on file  . Marital Status: Not on file  Intimate Partner Violence:   . Fear of Current or Ex-Partner: Not on file  . Emotionally Abused: Not on file  . Physically Abused: Not on file  . Sexually Abused: Not on file   Family History  Problem Relation Age of Onset  . Hypertension Father   . Hyperlipidemia Father   . Heart disease Father        Died at age 45 of MI  . Hypothyroidism Father   . Diabetes Maternal Grandmother   . Raynaud syndrome Sister   . Rheum arthritis Sister   . Thyroid disease Sister   . CAD Brother        has 4 stents  . Rheum arthritis Brother   . Heart disease Sister 24       h/o MI @age  39  . Thyroid disease Sister   . Breast cancer Neg Hx   . Ovarian cancer Neg Hx   . Prostate cancer Neg Hx   . Colon cancer  Neg Hx   . Stomach cancer Neg Hx   . Esophageal cancer Neg Hx     Objective: Office vital signs reviewed. BP 109/79   Pulse 75   Temp 97.7 F (36.5 C)   Ht 5\' 2"  (1.575 m)   Wt 154 lb 3.2 oz (69.9 kg)   SpO2 94%   BMI 28.20 kg/m   Physical Examination:  General: Awake, alert, well  nourished, No acute distress HEENT: Normal, sclera white.  Moist mucous membranes Cardio: regular rate and rhythm, S1S2 heard, no murmurs appreciated Pulm: Globally decreased breath sounds.  Normal work of breathing on room air.  No wheezes, rhonchi or rales noted Extremities: warm, well perfused, No edema, cyanosis or clubbing; +2 pulses bilaterally MSK: normal gait and station  Assessment/ Plan: 64 y.o. female   1. Type 2 diabetes mellitus with hyperglycemia, without long-term current use of insulin (HCC) Under good control with current regimen.  She is currently getting the Ozempic through a patient assistance program.  Plan for diabetic foot exam at next visit  2. Hyperlipidemia associated with type 2 diabetes mellitus ([place]) Continue Repatha  3. Lung disease, bullous ([place]) Stable.  Anoro sample was provided today  4. Reactive depression Stable.  Continue Prozac  5. Other specified hypothyroidism TSH was suppressed.  Synthroid 75 mcg was prescribed - Thyroid Panel With TSH; Future  6. HYPERTENSION, BENIGN Controlled. - losartan (COZAAR) 25 MG tablet; Take 1 tablet (25 mg total) by mouth daily.  Dispense: 90 tablet; Refill: 3   She will return in 6 weeks for repeat thyroid panel.  She will also get her flu shot when she returns from her vacation Orders Placed This Encounter  Procedures  . Thyroid Panel With TSH    Standing Status:   Future    Standing Expiration Date:   08/17/2021   Meds ordered this encounter  Medications  . levothyroxine (SYNTHROID) 75 MCG tablet    Sig: Take 1 tablet (75 mcg total) by mouth daily.    Dispense:  90 tablet    Refill:  0  . losartan (COZAAR)  25 MG tablet    Sig: Take 1 tablet (25 mg total) by mouth daily.    Dispense:  90 tablet    Refill:  [place], [place] [phone]

## 2020-08-27 DIAGNOSIS — L821 Other seborrheic keratosis: Secondary | ICD-10-CM | POA: Diagnosis not present

## 2020-08-27 DIAGNOSIS — L814 Other melanin hyperpigmentation: Secondary | ICD-10-CM | POA: Diagnosis not present

## 2020-08-27 DIAGNOSIS — L570 Actinic keratosis: Secondary | ICD-10-CM | POA: Diagnosis not present

## 2020-08-27 DIAGNOSIS — D229 Melanocytic nevi, unspecified: Secondary | ICD-10-CM | POA: Diagnosis not present

## 2020-08-28 ENCOUNTER — Telehealth: Payer: Self-pay | Admitting: Family Medicine

## 2020-08-28 NOTE — Telephone Encounter (Signed)
Patient aware ntbs 

## 2020-08-28 NOTE — Telephone Encounter (Signed)
Pt states that she is having pain in her ear, feels congested and noticed blood in her ear yesterday. Tried ear drops and it helped but has not cleared up completely

## 2020-09-19 ENCOUNTER — Other Ambulatory Visit: Payer: Self-pay

## 2020-09-19 ENCOUNTER — Encounter (HOSPITAL_BASED_OUTPATIENT_CLINIC_OR_DEPARTMENT_OTHER): Payer: Self-pay

## 2020-09-19 ENCOUNTER — Emergency Department (HOSPITAL_BASED_OUTPATIENT_CLINIC_OR_DEPARTMENT_OTHER): Payer: Medicare Other

## 2020-09-19 ENCOUNTER — Emergency Department (HOSPITAL_BASED_OUTPATIENT_CLINIC_OR_DEPARTMENT_OTHER)
Admission: EM | Admit: 2020-09-19 | Discharge: 2020-09-19 | Disposition: A | Discharge status: Home / Self Care | Payer: Medicare Other | Attending: Emergency Medicine | Admitting: Emergency Medicine

## 2020-09-19 DIAGNOSIS — J40 Bronchitis, not specified as acute or chronic: Secondary | ICD-10-CM | POA: Diagnosis not present

## 2020-09-19 DIAGNOSIS — Z8616 Personal history of COVID-19: Secondary | ICD-10-CM | POA: Diagnosis not present

## 2020-09-19 DIAGNOSIS — Z87891 Personal history of nicotine dependence: Secondary | ICD-10-CM | POA: Diagnosis not present

## 2020-09-19 DIAGNOSIS — Z955 Presence of coronary angioplasty implant and graft: Secondary | ICD-10-CM | POA: Insufficient documentation

## 2020-09-19 DIAGNOSIS — E039 Hypothyroidism, unspecified: Secondary | ICD-10-CM | POA: Diagnosis not present

## 2020-09-19 DIAGNOSIS — I10 Essential (primary) hypertension: Secondary | ICD-10-CM | POA: Diagnosis not present

## 2020-09-19 DIAGNOSIS — Z79899 Other long term (current) drug therapy: Secondary | ICD-10-CM | POA: Insufficient documentation

## 2020-09-19 DIAGNOSIS — R911 Solitary pulmonary nodule: Secondary | ICD-10-CM | POA: Diagnosis not present

## 2020-09-19 DIAGNOSIS — Z7951 Long term (current) use of inhaled steroids: Secondary | ICD-10-CM | POA: Diagnosis not present

## 2020-09-19 DIAGNOSIS — I2699 Other pulmonary embolism without acute cor pulmonale: Secondary | ICD-10-CM | POA: Diagnosis not present

## 2020-09-19 DIAGNOSIS — J441 Chronic obstructive pulmonary disease with (acute) exacerbation: Secondary | ICD-10-CM | POA: Insufficient documentation

## 2020-09-19 DIAGNOSIS — Z20822 Contact with and (suspected) exposure to covid-19: Secondary | ICD-10-CM | POA: Diagnosis not present

## 2020-09-19 DIAGNOSIS — R0602 Shortness of breath: Secondary | ICD-10-CM | POA: Diagnosis not present

## 2020-09-19 LAB — CBC WITH DIFFERENTIAL/PLATELET
Abs Immature Granulocytes: 0.01 10*3/uL (ref 0.00–0.07)
Basophils Absolute: 0.1 10*3/uL (ref 0.0–0.1)
Basophils Relative: 1 %
Eosinophils Absolute: 0.3 10*3/uL (ref 0.0–0.5)
Eosinophils Relative: 4 %
HCT: 42.7 % (ref 36.0–46.0)
Hemoglobin: 14.6 g/dL (ref 12.0–15.0)
Immature Granulocytes: 0 %
Lymphocytes Relative: 34 %
Lymphs Abs: 2.4 10*3/uL (ref 0.7–4.0)
MCH: 32.2 pg (ref 26.0–34.0)
MCHC: 34.2 g/dL (ref 30.0–36.0)
MCV: 94.1 fL (ref 80.0–100.0)
Monocytes Absolute: 0.5 10*3/uL (ref 0.1–1.0)
Monocytes Relative: 7 %
Neutro Abs: 3.9 10*3/uL (ref 1.7–7.7)
Neutrophils Relative %: 54 %
Platelets: 298 10*3/uL (ref 150–400)
RBC: 4.54 MIL/uL (ref 3.87–5.11)
RDW: 12 % (ref 11.5–15.5)
WBC: 7.1 10*3/uL (ref 4.0–10.5)
nRBC: 0 % (ref 0.0–0.2)

## 2020-09-19 LAB — BASIC METABOLIC PANEL
Anion gap: 11 (ref 5–15)
BUN: 10 mg/dL (ref 8–23)
CO2: 22 mmol/L (ref 22–32)
Calcium: 8.8 mg/dL — ABNORMAL LOW (ref 8.9–10.3)
Chloride: 104 mmol/L (ref 98–111)
Creatinine, Ser: 1 mg/dL (ref 0.44–1.00)
GFR, Estimated: >60 mL/min (ref >60)
Glucose, Bld: 148 mg/dL — ABNORMAL HIGH (ref 70–99)
Potassium: 3.4 mmol/L — ABNORMAL LOW (ref 3.5–5.1)
Sodium: 137 mmol/L (ref 135–145)

## 2020-09-19 LAB — RESP PANEL BY RT-PCR (FLU A&B, COVID) ARPGX2
Influenza A by PCR: NEGATIVE
Influenza B by PCR: NEGATIVE
SARS Coronavirus 2 by RT PCR: NEGATIVE

## 2020-09-19 LAB — BRAIN NATRIURETIC PEPTIDE: B Natriuretic Peptide: 17.7 pg/mL (ref 0.0–100.0)

## 2020-09-19 MED ORDER — PREDNISONE 10 MG (21) PO TBPK: ORAL_TABLET | ORAL | 0 refills | Status: DC

## 2020-09-19 MED ORDER — IPRATROPIUM-ALBUTEROL 0.5-2.5 (3) MG/3ML IN SOLN
3.0000 mL | Freq: Once | RESPIRATORY_TRACT | Status: AC
  Administered 2020-09-19: 3 mL via RESPIRATORY_TRACT
  Filled 2020-09-19: qty 3

## 2020-09-19 MED ORDER — DOXYCYCLINE HYCLATE 100 MG PO CAPS: 100.0000 mg | ORAL_CAPSULE | Freq: Two times a day (BID) | ORAL | 0 refills | Status: DC

## 2020-09-19 MED ORDER — ALBUTEROL SULFATE (2.5 MG/3ML) 0.083% IN NEBU: 2.5000 mg | INHALATION_SOLUTION | Freq: Four times a day (QID) | RESPIRATORY_TRACT | 0 refills | Status: DC | PRN

## 2020-09-19 MED ORDER — METHYLPREDNISOLONE SODIUM SUCC 125 MG IJ SOLR
125.0000 mg | Freq: Once | INTRAMUSCULAR | Status: AC
  Administered 2020-09-19: 125 mg via INTRAVENOUS
  Filled 2020-09-19: qty 2

## 2020-09-19 MED ORDER — DOXYCYCLINE HYCLATE 100 MG PO TABS
100.0000 mg | ORAL_TABLET | Freq: Once | ORAL | Status: AC
  Administered 2020-09-19: 100 mg via ORAL
  Filled 2020-09-19: qty 1

## 2020-09-19 MED ORDER — IOHEXOL 350 MG/ML SOLN
100.0000 mL | Freq: Once | INTRAVENOUS | Status: AC | PRN
  Administered 2020-09-19: 82 mL via INTRAVENOUS

## 2020-09-19 NOTE — ED Triage Notes (Signed)
Pt complaining of shortness of breath x 5 days along with bilateral back pain. Hx of emphysema. Coughing up green phlegm. Denies sick contacts

## 2020-09-19 NOTE — ED Provider Notes (Signed)
[facility] EMERGENCY DEPARTMENT Provider Note   CSN: [id number] Arrival date & time: 09/19/20  5885     History Chief Complaint  Patient presents with  . Shortness of Breath    Stacy [name] is a 64 y.o. female.  Pt presents to the ED today with sob.  The pt said she's been sob for 5 days.  She's been coughing up green phlegm.  She has a hx of COPD and is worried she has pneumonia.  She has not been febrile.  She's been fully vaccinated + booster for Covid.  She has used her usual inhalers without improvement in sx.        Past Medical History:  Diagnosis Date  . Asthma   . COPD, mild ([place])    Spirometerey 3/08  . Fatty liver disease, nonalcoholic   . GERD (gastroesophageal reflux disease)   . Hyperlipidemia   . Hypertension   . Hypothyroid Since 2003  . IBS (irritable bowel syndrome)   . Perimenopausal     Patient Active Problem List   Diagnosis Date Noted  . Drug-induced myopathy 04/21/2020  . Hyperlipidemia associated with type 2 diabetes mellitus ([place]) 03/24/2020  . Reactive depression 06/26/2018  . Psychophysiological insomnia 05/28/2018  . Snoring 05/28/2018  . Hypersomnia with sleep apnea 05/28/2018  . Centrilobular emphysema ([place]) 05/28/2018  . Obesity (BMI 30.0-34.9) 05/28/2018  . Shortness of breath 05/28/2018  . Controlled diabetes mellitus type 2 with complications ([place]) 02/77/4128  . Shoulder pain, right 12/28/2017  . Chronic bilateral back pain 11/10/2017  . Lung disease, bullous ([place]) 08/14/2017  . Aortic atherosclerosis ([place]) 08/14/2017  . Heart palpitations 08/01/2017  . Glucose intolerance 06/27/2017  . Stress incontinence in female 06/09/2017  . Overactive bladder 06/09/2017  . Precordial pain   . Dyspareunia 04/08/2014  . PERIMENOPAUSAL SYNDROME 05/13/2009  . HYPERCHOLESTEROLEMIA, PURE 12/24/2008  . HYPERTENSION, BENIGN 12/24/2008  . FATTY LIVER DISEASE 08/06/2008  . Allergic rhinitis 02/25/2008  . COPD with asthma ([place])  11/02/2007  . GERD 11/02/2007  . Rosacea 08/01/2007  . Hypothyroidism 06/13/2007  . UTI'S, RECURRENT 06/13/2007    Past Surgical History:  Procedure Laterality Date  . ABLATION    . CARDIAC CATHETERIZATION N/A 06/12/2015   Procedure: Left Heart Cath and Coronary Angiography;  Surgeon: Burnell Blanks, MD;  Location: [facility];  Service: Cardiovascular;  Laterality: N/A;  . CESAREAN SECTION    . LTCS     x2  . SPIROMETRY  01/05/2007   FVC 68% predicted; FEV1 44% predicted; FEV1/FVC 63% predicted = moderate obstruction with low vital capacity possibly due to restriction  . TUBAL LIGATION       OB History    Gravida  3   Para  2   Term  2   Preterm      AB  1   Living  2     SAB  1   TAB      Ectopic      Multiple      Live Births              Family History  Problem Relation Age of Onset  . Hypertension Father   . Hyperlipidemia Father   . Heart disease Father        Died at age 36 of MI  . Hypothyroidism Father   . Diabetes Maternal Grandmother   . Raynaud syndrome Sister   . Rheum arthritis Sister   . Thyroid disease Sister   .  CAD Brother        has 4 stents  . Rheum arthritis Brother   . Heart disease Sister 78       h/o MI @age  35  . Thyroid disease Sister   . Breast cancer Neg Hx   . Ovarian cancer Neg Hx   . Prostate cancer Neg Hx   . Colon cancer Neg Hx   . Stomach cancer Neg Hx   . Esophageal cancer Neg Hx     Social History   Tobacco Use  . Smoking status: Former Smoker    Packs/day: 0.50    Years: 30.00    Pack years: 15.00    Types: Cigarettes    Quit date: 10/25/2007    Years since quitting: 12.9  . Smokeless tobacco: Never Used  Vaping Use  . Vaping Use: Never used  Substance Use Topics  . Alcohol use: Yes    Alcohol/week: 1.0 standard drink    Types: 1 Glasses of wine per week    Comment: Occasional-once a year  . Drug use: No    Home Medications Prior to Admission medications   Medication Sig  Start Date End Date Taking? Authorizing Provider  albuterol (VENTOLIN HFA) 108 (90 Base) MCG/ACT inhaler Inhale 2 puffs into the lungs every 6 (six) hours as needed for wheezing or shortness of breath. 04/29/20  Yes Gottschalk, Ashly M, DO  FLUoxetine (PROZAC) 20 MG tablet Take 20 mg by mouth daily.   Yes [provider]  levothyroxine (SYNTHROID) 75 MCG tablet Take 1 tablet (75 mcg total) by mouth daily. 08/17/20  Yes Gottschalk, Leatrice Jewels M, DO  loratadine (CLARITIN) 10 MG tablet Take 10 mg by mouth daily.   Yes [provider]  losartan (COZAAR) 25 MG tablet Take 1 tablet (25 mg total) by mouth daily. 08/17/20  Yes Gottschalk, Leatrice Jewels M, DO  Omeprazole-Sodium Bicarbonate (ZEGERID OTC PO) Take by mouth.   Yes [provider]  Semaglutide,0.25 or 0.5MG /DOS, (OZEMPIC, 0.25 OR 0.5 MG/DOSE,) 2 MG/1.5ML SOPN Inject 0.25 mg into the skin once a week.   Yes [provider]  umeclidinium-vilanterol (ANORO ELLIPTA) 62.5-25 MCG/INH AEPB INHALE 1 PUFF BY MOUTH EVERY DAY 06/15/20  Yes Collene Gobble, MD  albuterol (PROVENTIL) (2.5 MG/3ML) 0.083% nebulizer solution Take 3 mLs (2.5 mg total) by nebulization every 6 (six) hours as needed for wheezing or shortness of breath. 09/19/20   Isla Pence, MD  doxycycline (VIBRAMYCIN) 100 MG capsule Take 1 capsule (100 mg total) by mouth 2 (two) times daily. 09/19/20   Isla Pence, MD  Evolocumab (REPATHA) 140 MG/ML SOSY Inject 140 mg into the skin every 14 (fourteen) days. 04/20/20   [name], DO  predniSONE (STERAPRED UNI-PAK 21 TAB) 10 MG (21) TBPK tablet Take 6 tabs for 2 days, then 5 for 2 days, then 4 for 2 days, then 3 for 2 days, 2 for 2 days, then 1 for 2 days 09/19/20   Isla Pence, MD    Allergies    Avelox [moxifloxacin hcl in nacl], Ciprofloxacin, Livalo [pitavastatin], Metformin and related, Moxifloxacin, Bactrim [sulfamethoxazole-trimethoprim], Statins, and Sulfamethoxazole-trimethoprim  Review of Systems    Review of Systems  Respiratory: Positive for cough and shortness of breath.   All other systems reviewed and are negative.   Physical Exam Updated Vital Signs BP 108/82 (BP Location: Right Arm)   Pulse 74   Temp 98 F (36.7 C) (Oral)   Resp 15   Ht 5\' 2"  (1.575 m)  Wt 68.5 kg   SpO2 97%   BMI 27.62 kg/m   Physical Exam Vitals and nursing note reviewed.  Constitutional:      Appearance: She is well-developed.  HENT:     Head: Normocephalic and atraumatic.     Mouth/Throat:     Mouth: Mucous membranes are moist.     Pharynx: Oropharynx is clear.  Eyes:     Extraocular Movements: Extraocular movements intact.     Pupils: Pupils are equal, round, and reactive to light.  Cardiovascular:     Rate and Rhythm: Normal rate and regular rhythm.  Pulmonary:     Effort: Pulmonary effort is normal.     Breath sounds: Wheezing present.  Abdominal:     General: Bowel sounds are normal.     Palpations: Abdomen is soft.  Musculoskeletal:        General: Normal range of motion.     Cervical back: Normal range of motion and neck supple.  Skin:    General: Skin is warm.     Capillary Refill: Capillary refill takes less than 2 seconds.  Neurological:     General: No focal deficit present.     Mental Status: She is alert and oriented to person, place, and time.  Psychiatric:        Mood and Affect: Mood normal.        Behavior: Behavior normal.     ED Results / Procedures / Treatments   Labs (all labs ordered are listed, but only abnormal results are displayed) Labs Reviewed  BASIC METABOLIC PANEL - Abnormal; Notable for the following components:      Result Value   Potassium 3.4 (*)    Glucose, Bld 148 (*)    Calcium 8.8 (*)    All other components within normal limits  RESP PANEL BY RT-PCR (FLU A&B, COVID) ARPGX2  CBC WITH DIFFERENTIAL/PLATELET  BRAIN NATRIURETIC PEPTIDE    EKG EKG Interpretation  Date/Time:  Saturday September 19 2020 09:59:48 EST Ventricular  Rate:  71 PR Interval:    QRS Duration: 101 QT Interval:  422 QTC Calculation: 459 R Axis:   61 Text Interpretation: Sinus rhythm Low voltage, precordial leads No significant change since last tracing Confirmed by Isla Pence [phone]) on 09/19/2020 10:01:50 AM   Radiology CT Angio Chest PE W and/or Wo Contrast  Result Date: 09/19/2020 CLINICAL DATA:  64 year old female with shortness of breath, concern for pulmonary embolism. EXAM: CT ANGIOGRAPHY CHEST WITH CONTRAST TECHNIQUE: Multidetector CT imaging of the chest was performed using the standard protocol during bolus administration of intravenous contrast. Multiplanar CT image reconstructions and MIPs were obtained to evaluate the vascular anatomy. CONTRAST:  Eighty-two mL Omnipaque 350, intravenous COMPARISON:  11/28/2019 FINDINGS: Cardiovascular: Satisfactory opacification of the pulmonary arteries to the segmental level. No evidence of pulmonary embolism. Normal caliber pulmonary arteries. Normal right to left ventricular ratio. Normal heart size. No pericardial effusion. Mediastinum/Nodes: No enlarged mediastinal, hilar, or axillary lymph nodes. Thyroid gland, trachea, and esophagus demonstrate no significant findings. Lungs/Pleura: Severe upper lobe predominant centrilobular emphysema. No focal consolidations, pleural effusion, or pneumothorax. No suspicious pulmonary nodules. Upper Abdomen: Decreased attenuation of the hepatic parenchyma. The remaining visualized upper abdomen is within normal limits. Musculoskeletal: No acute osseous abnormality. No aggressive appearing osseous lesion. Review of the MIP images confirms the above findings. IMPRESSION: Vascular: No evidence of pulmonary embolism. Non-Vascular: Similar appearing severe upper lobe predominant centrilobular emphysema (emphysema (ICD10-J43.9). No acute intrathoracic abnormality. Ruthann Cancer, MD [facility] Radiology Electronically  Signed   By: Ruthann Cancer MD   On: 09/19/2020 12:24   DG Chest Port 1 View  Result Date: 09/19/2020 CLINICAL DATA:  Short of breath for 5 days. EXAM: PORTABLE CHEST 1 VIEW COMPARISON:  08/14/2017 FINDINGS: Stable cardiomediastinal contours. No pleural effusion or edema identified. No airspace densities identified. Lungs are hyperinflated with chronic coarsened interstitial markings compatible with emphysema spondylosis identified within the thoracic spine. IMPRESSION: No acute cardiopulmonary abnormalities. Electronically Signed   By: Kerby Moors M.D.   On: 09/19/2020 10:33    Procedures Procedures (including critical care time)  Medications Ordered in ED Medications  doxycycline (VIBRA-TABS) tablet 100 mg (has no administration in time range)  methylPREDNISolone sodium succinate (SOLU-MEDROL) 125 mg/2 mL injection 125 mg (125 mg Intravenous Given 09/19/20 1055)  iohexol (OMNIPAQUE) 350 MG/ML injection 100 mL (82 mLs Intravenous Contrast Given 09/19/20 1156)  ipratropium-albuterol (DUONEB) 0.5-2.5 (3) MG/3ML nebulizer solution 3 mL (3 mLs Nebulization Given 09/19/20 1147)    ED Course  I have reviewed the triage vital signs and the nursing notes.  Pertinent labs & imaging results that were available during my care of the patient were reviewed by me and considered in my medical decision making (see chart for details).    MDM Rules/Calculators/A&P                          Pt is given solumedrol and is feeling better.  Covid neg.  After covid test, pt was given a duoneb.  She is feeling even better.  Pt does not have a nebulizer machine, so I ordered one plus the albuterol to go in it.  Pt had a CTA to r/o PE because of the pain in her back and the sob.  No pe.  Pt is stable for d/c.  Return if worse.  Stacy [name] was evaluated in Emergency Department on 09/19/2020 for the symptoms described in the history of present illness. She was evaluated in the context of the global COVID-19  pandemic, which necessitated consideration that the patient might be at risk for infection with the SARS-CoV-2 virus that causes COVID-19. Institutional protocols and algorithms that pertain to the evaluation of patients at risk for COVID-19 are in a state of rapid change based on information released by regulatory bodies including the CDC and federal and state organizations. These policies and algorithms were followed during the patient's care in the ED. Final Clinical Impression(s) / ED Diagnoses Final diagnoses:  COPD exacerbation ([place])  Bronchitis    Rx / DC Orders ED Discharge Orders         Ordered    For home use only DME Nebulizer machine        09/19/20 1243    albuterol (PROVENTIL) (2.5 MG/3ML) 0.083% nebulizer solution  Every 6 hours PRN        09/19/20 1243    predniSONE (STERAPRED UNI-PAK 21 TAB) 10 MG (21) TBPK tablet        09/19/20 1243    doxycycline (VIBRAMYCIN) 100 MG capsule  2 times daily        09/19/20 1244           Isla Pence, MD 09/19/20 1249

## 2020-09-22 ENCOUNTER — Telehealth: Payer: Self-pay | Admitting: *Deleted

## 2020-09-22 NOTE — Telephone Encounter (Signed)
Repatha Inj 140mg /Ml, use as directed, is approved through 10/23/2021 under your Medicare Part D benefit.    Pharmacy aware.

## 2020-09-22 NOTE — Telephone Encounter (Signed)
PA in process for Repatha   Key: [id number]  [employer] is reviewing your PA request. Typically an electronic response will be received within 72 hours. To check for an update later, open this request from your dashboard.  You may close this dialog and return to your dashboard to perform other tasks.

## 2020-09-29 ENCOUNTER — Other Ambulatory Visit: Payer: Self-pay

## 2020-09-29 ENCOUNTER — Other Ambulatory Visit: Payer: Medicare Other

## 2020-09-29 DIAGNOSIS — E038 Other specified hypothyroidism: Secondary | ICD-10-CM | POA: Diagnosis not present

## 2020-09-30 DIAGNOSIS — Z79899 Other long term (current) drug therapy: Secondary | ICD-10-CM | POA: Diagnosis not present

## 2020-09-30 DIAGNOSIS — M609 Myositis, unspecified: Secondary | ICD-10-CM | POA: Diagnosis not present

## 2020-09-30 LAB — THYROID PANEL WITH TSH
Free Thyroxine Index: 2.2 (ref 1.2–4.9)
T3 Uptake Ratio: 29 % (ref 24–39)
T4, Total: 7.7 ug/dL (ref 4.5–12.0)
TSH: 4.22 u[IU]/mL (ref 0.450–4.500)

## 2020-10-01 ENCOUNTER — Encounter: Payer: Self-pay | Admitting: Family Medicine

## 2020-10-01 ENCOUNTER — Other Ambulatory Visit: Payer: Self-pay | Admitting: Family Medicine

## 2020-10-01 ENCOUNTER — Ambulatory Visit (INDEPENDENT_AMBULATORY_CARE_PROVIDER_SITE_OTHER): Payer: Medicare Other | Admitting: Family Medicine

## 2020-10-01 ENCOUNTER — Other Ambulatory Visit: Payer: Self-pay

## 2020-10-01 VITALS — BP 113/71 | HR 74 | Temp 97.7°F | Ht 62.0 in | Wt 155.2 lb

## 2020-10-01 DIAGNOSIS — Z6379 Other stressful life events affecting family and household: Secondary | ICD-10-CM | POA: Diagnosis not present

## 2020-10-01 DIAGNOSIS — J439 Emphysema, unspecified: Secondary | ICD-10-CM | POA: Diagnosis not present

## 2020-10-01 MED ORDER — FLUOXETINE HCL 20 MG PO TABS
20.0000 mg | ORAL_TABLET | Freq: Every day | ORAL | 3 refills | Status: DC
Start: 2020-10-01 — End: 2020-10-05

## 2020-10-01 NOTE — Progress Notes (Signed)
Subjective: CC: ER follow-up for COPD exacerbation PCP: [name], DO WYO:VZCHY Stacy [name] is Stacy 64 y.o. female presenting to clinic today for:  1.  COPD exacerbation/ stress Patient reports that her breathing is back to baseline.  She admits that she may have put herself on the back burner when she was having some mild exacerbation of her symptoms.  Her brother unfortunately is struggling with heart and lung failure such that he may have to receive Stacy double lung transplant and heart transplant due to complications of [id number].  She is going to see him in the rehabilitation center with her mother this week.  She voices quite Stacy bit of concern about that because he is very close to her.  He is her baby brother.  Her breathing is back to her baseline.  She is compliant with her inhalers.  Does not need any samples today.   ROS: Per HPI  Allergies  Allergen Reactions  . Avelox [Moxifloxacin Hcl In Nacl] Hives  . Ciprofloxacin     REACTION: Rash  . Livalo [Pitavastatin]   . Metformin And Related     kidney  . Moxifloxacin     REACTION: RASH  . Bactrim [Sulfamethoxazole-Trimethoprim] Rash  . Statins Other (See Comments)    myalgias  . Sulfamethoxazole-Trimethoprim Rash    REACTION: Rash   Past Medical History:  Diagnosis Date  . Asthma   . COPD, mild ([place])    Spirometerey 3/08  . Fatty liver disease, nonalcoholic   . GERD (gastroesophageal reflux disease)   . Hyperlipidemia   . Hypertension   . Hypothyroid Since 2003  . IBS (irritable bowel syndrome)   . Perimenopausal     Current Outpatient Medications:  .  albuterol (PROVENTIL) (2.5 MG/3ML) 0.083% nebulizer solution, Take 3 mLs (2.5 mg total) by nebulization every 6 (six) hours as needed for wheezing or shortness of breath., Disp: 75 mL, Rfl: 0 .  albuterol (VENTOLIN HFA) 108 (90 Base) MCG/ACT inhaler, Inhale 2 puffs into the lungs every 6 (six) hours as needed for wheezing or shortness of breath., Disp: 18 g, Rfl:  11 .  Evolocumab (REPATHA) 140 MG/ML SOSY, Inject 140 mg into the skin every 14 (fourteen) days., Disp: 2.1 mL, Rfl: 4 .  FLUoxetine (PROZAC) 20 MG tablet, Take 20 mg by mouth daily., Disp: , Rfl:  .  levothyroxine (SYNTHROID) 75 MCG tablet, Take 1 tablet (75 mcg total) by mouth daily., Disp: 90 tablet, Rfl: 0 .  loratadine (CLARITIN) 10 MG tablet, Take 10 mg by mouth daily., Disp: , Rfl:  .  losartan (COZAAR) 25 MG tablet, Take 1 tablet (25 mg total) by mouth daily., Disp: 90 tablet, Rfl: 3 .  Omeprazole-Sodium Bicarbonate (ZEGERID OTC PO), Take by mouth., Disp: , Rfl:  .  Semaglutide,0.25 or 0.5MG /DOS, (OZEMPIC, 0.25 OR 0.5 MG/DOSE,) 2 MG/1.5ML SOPN, Inject 0.25 mg into the skin once Stacy week., Disp: , Rfl:  .  umeclidinium-vilanterol (ANORO ELLIPTA) 62.5-25 MCG/INH AEPB, INHALE 1 PUFF BY MOUTH EVERY DAY, Disp: 60 each, Rfl: 11 Social History   Socioeconomic History  . Marital status: Married    Spouse name: Not on file  . Number of children: 2  . Years of education: Not on file  . Highest education level: Not on file  Occupational History  . Occupation: Works in Set designer  . Smoking status: Former Smoker    Packs/day: 0.50    Years: 30.00    Pack years: 15.00  Types: Cigarettes    Quit date: 10/25/2007    Years since quitting: 12.9  . Smokeless tobacco: Never Used  Vaping Use  . Vaping Use: Never used  Substance and Sexual Activity  . Alcohol use: Yes    Alcohol/week: 1.0 standard drink    Types: 1 Glasses of wine per week    Comment: Occasional-once Stacy year  . Drug use: No  . Sexual activity: Yes    Birth control/protection: Surgical  Other Topics Concern  . Not on file  Social History Narrative   Remarried.    Previous occupation: Customer service manager.  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      Moved from [facility] in 2007   2 children, has stepchildren and grandchildren   Does not exercise   Social Determinants of Health   Financial Resource Strain: Not on file   Food Insecurity: Not on file  Transportation Needs: Not on file  Physical Activity: Not on file  Stress: Not on file  Social Connections: Not on file  Intimate Partner Violence: Not on file   Family History  Problem Relation Age of Onset  . Hypertension Father   . Hyperlipidemia Father   . Heart disease Father        Died at age 97 of MI  . Hypothyroidism Father   . Diabetes Maternal Grandmother   . Raynaud syndrome Sister   . Rheum arthritis Sister   . Thyroid disease Sister   . CAD Brother        has 4 stents  . Rheum arthritis Brother   . Heart disease Sister 75       h/o MI @age  24  . Thyroid disease Sister   . Breast cancer Neg Hx   . Ovarian cancer Neg Hx   . Prostate cancer Neg Hx   . Colon cancer Neg Hx   . Stomach cancer Neg Hx   . Esophageal cancer Neg Hx     Objective: Office vital signs reviewed. BP 113/71   Pulse 74   Temp 97.7 F (36.5 C)   Ht 5\' 2"  (1.575 m)   Wt 155 lb 3.2 oz (70.4 kg)   SpO2 95%   BMI 28.39 kg/m   Physical Examination:  General: Awake, alert, No acute distress HEENT: Normal; sclera injected Cardio: regular rate and rhythm, S1S2 heard, no murmurs appreciated Pulm: Decreased breath sounds of the apexes of the lung but fair air movement in the lower lung.  She has normal work of breathing on room air.  No wheezes, rhonchi or rales Psych: Mood is somewhat labile.  She is intermittently tearful when talking about her brother.  Assessment/ Plan: 64 y.o. female   Lung disease, bullous ([place])  Stress due to illness of family member  Symptoms are stable.  Exacerbation resolved.  Continue current regimen.  Follow-up as needed on that issue  I encouraged her to contact me if she needs any additional assistance.  Hopefully her brother will have Stacy good outcome.  Renewal of her Prozac has been sent  No orders of the defined types were placed in this encounter.  Meds ordered this encounter  Medications  . FLUoxetine (PROZAC) 20 MG  tablet    Sig: Take 1 tablet (20 mg total) by mouth daily.    Dispense:  90 tablet    Refill:  [place], [place] [phone]

## 2020-10-02 NOTE — Telephone Encounter (Signed)
Pharmacy comment:  Alternative Requested:DRUG NOT COVERED PLEASE SEND ALTERNATIVE.   All Pharmacy Suggested Alternatives:   0 citalopram (CELEXA) 20 MG tablet 0 escitalopram (LEXAPRO) 10 MG tablet 0 sertraline (ZOLOFT) 50 MG tablet 0 FLUoxetine (PROZAC) 20 MG capsule

## 2020-10-05 ENCOUNTER — Other Ambulatory Visit: Payer: Self-pay | Admitting: Family Medicine

## 2020-10-05 MED ORDER — FLUOXETINE HCL 20 MG PO CAPS: 20.0000 mg | ORAL_CAPSULE | Freq: Every day | ORAL | 3 refills | Status: DC

## 2020-10-07 ENCOUNTER — Telehealth: Payer: Self-pay | Admitting: Pharmacist

## 2020-10-07 NOTE — Telephone Encounter (Signed)
Call placed to patient regarding re-enrollment for patient assistance for 2022 Left VM encouraging patient to call back and set up appt with PharmD and bring newest financial info

## 2020-10-19 ENCOUNTER — Encounter: Payer: Self-pay | Admitting: Family Medicine

## 2020-10-19 ENCOUNTER — Other Ambulatory Visit: Payer: Self-pay

## 2020-10-19 ENCOUNTER — Ambulatory Visit (INDEPENDENT_AMBULATORY_CARE_PROVIDER_SITE_OTHER): Payer: Medicare Other | Admitting: Family Medicine

## 2020-10-19 DIAGNOSIS — J0100 Acute maxillary sinusitis, unspecified: Secondary | ICD-10-CM

## 2020-10-19 MED ORDER — CEFDINIR 300 MG PO CAPS: 300.0000 mg | ORAL_CAPSULE | Freq: Two times a day (BID) | ORAL | 0 refills | Status: DC

## 2020-10-19 NOTE — Progress Notes (Signed)
Telephone visit  Subjective: CC: URI PCP: Raliegh Ip, DO DPO:EUMPN A Mcinnis is a 64 y.o. female calls for telephone consult today. Patient provides verbal consent for consult held via phone.  Due to COVID-19 pandemic this visit was conducted virtually. This visit type was conducted due to national recommendations for restrictions regarding the COVID-19 Pandemic (e.g. social distancing, sheltering in place) in an effort to limit this patient's exposure and mitigate transmission in our community. All issues noted in this document were discussed and addressed.  A physical exam was not performed with this format.   Location of patient: home Location of provider: [facility] Others present for call: none  1. URI Patient reports ongoing headache >1 month now.  She wakes up with headache. She reports facial/ dental pain, earache on left.  She reports sinusitis and post nasal drip.  She is using Mucinex sinus last night, which helped some.  Not using Flonase/ Nasacort.  She reports that the left maxillary sinus was swollen.     ROS: Per HPI  Allergies  Allergen Reactions   Avelox [Moxifloxacin Hcl In Nacl] Hives   Ciprofloxacin     REACTION: Rash   Livalo [Pitavastatin]    Metformin And Related     kidney   Moxifloxacin     REACTION: RASH   Bactrim [Sulfamethoxazole-Trimethoprim] Rash   Statins Other (See Comments)    myalgias   Sulfamethoxazole-Trimethoprim Rash    REACTION: Rash   Past Medical History:  Diagnosis Date   Asthma    COPD, mild ([facility])    Spirometerey 3/08   Fatty liver disease, nonalcoholic    GERD (gastroesophageal reflux disease)    Hyperlipidemia    Hypertension    Hypothyroid Since 2003   IBS (irritable bowel syndrome)    Perimenopausal     Current Outpatient Medications:    albuterol (PROVENTIL) (2.5 MG/3ML) 0.083% nebulizer solution, Take 3 mLs (2.5 mg total) by nebulization every 6 (six) hours as needed for wheezing or shortness of  breath., Disp: 75 mL, Rfl: 0   albuterol (VENTOLIN HFA) 108 (90 Base) MCG/ACT inhaler, Inhale 2 puffs into the lungs every 6 (six) hours as needed for wheezing or shortness of breath., Disp: 18 g, Rfl: 11   Evolocumab (REPATHA) 140 MG/ML SOSY, Inject 140 mg into the skin every 14 (fourteen) days., Disp: 2.1 mL, Rfl: 4   FLUoxetine (PROZAC) 20 MG capsule, Take 1 capsule (20 mg total) by mouth daily., Disp: 90 capsule, Rfl: 3   levothyroxine (SYNTHROID) 75 MCG tablet, Take 1 tablet (75 mcg total) by mouth daily., Disp: 90 tablet, Rfl: 0   loratadine (CLARITIN) 10 MG tablet, Take 10 mg by mouth daily., Disp: , Rfl:    losartan (COZAAR) 25 MG tablet, Take 1 tablet (25 mg total) by mouth daily., Disp: 90 tablet, Rfl: 3   Omeprazole-Sodium Bicarbonate (ZEGERID OTC PO), Take by mouth., Disp: , Rfl:    Semaglutide,0.25 or 0.5MG /DOS, (OZEMPIC, 0.25 OR 0.5 MG/DOSE,) 2 MG/1.5ML SOPN, Inject 0.25 mg into the skin once a week., Disp: , Rfl:    umeclidinium-vilanterol (ANORO ELLIPTA) 62.5-25 MCG/INH AEPB, INHALE 1 PUFF BY MOUTH EVERY DAY, Disp: 60 each, Rfl: 11  Assessment/ Plan: 64 y.o. female   Subacute maxillary sinusitis - Plan: cefdinir (OMNICEF) 300 MG capsule  Suspect allergic in nature.  Recommended Nasonex, nettie pot.  Continue Claritin. Pocket prescription sent to use if no improvement in next 2-3 days given upcoming holiday weekend.  Home care instructions reasons for reevaluation discussed.  Patient was good understanding will follow as needed  Start time: 11:50am End time: 11:58am  Total time spent on patient care (including telephone call/ virtual visit): 8 minutes  Fiza Nation Hulen Skains, DO [facility] [phone]

## 2020-11-10 ENCOUNTER — Encounter: Payer: Self-pay | Admitting: Family Medicine

## 2020-11-12 ENCOUNTER — Encounter: Payer: Self-pay | Admitting: Family Medicine

## 2020-11-12 ENCOUNTER — Telehealth: Payer: Self-pay

## 2020-11-12 NOTE — Telephone Encounter (Signed)
Spoke to Stacy [name], she is aware that Dr. Lajuana Ripple is off today and will answer the my chart message in the morning.

## 2020-11-13 ENCOUNTER — Ambulatory Visit (INDEPENDENT_AMBULATORY_CARE_PROVIDER_SITE_OTHER): Payer: Medicare Other | Admitting: Family Medicine

## 2020-11-13 ENCOUNTER — Other Ambulatory Visit: Payer: Self-pay | Admitting: Family Medicine

## 2020-11-13 ENCOUNTER — Encounter: Payer: Self-pay | Admitting: Family Medicine

## 2020-11-13 DIAGNOSIS — A0472 Enterocolitis due to Clostridium difficile, not specified as recurrent: Secondary | ICD-10-CM

## 2020-11-13 MED ORDER — METRONIDAZOLE 500 MG PO TABS: 500.0000 mg | ORAL_TABLET | Freq: Two times a day (BID) | ORAL | 0 refills | Status: DC

## 2020-11-13 NOTE — Progress Notes (Signed)
Virtual Visit via telephone Note  I connected with Stacy [name] on 11/13/20 at 1740 by telephone and verified that I am speaking with the correct person using two identifiers. Stacy [name] is currently located at home and patient are currently with her during visit. The provider, Fransisca Kaufmann Shaterrica Territo, MD is located in their office at time of visit.  Call ended at 1749  I discussed the limitations, risks, security and privacy concerns of performing an evaluation and management service by telephone and the availability of in person appointments. I also discussed with the patient that there may be a patient responsible charge related to this service. The patient expressed understanding and agreed to proceed.   History and Present Illness: Patient is calling in for 2 weeks of diarrhea and then her husband was diagnosed with c diff.  She has frequent loose stools.  15 times per day.  She is keeping fluids going.  She has very odorous stools.  She is having some abdominal cramping.  She had previously been on an antibiotic. She has some LLQ abd discomfort. She does not have blood in stool  No diagnosis found.  Outpatient Encounter Medications as of 11/13/2020  Medication Sig  . albuterol (PROVENTIL) (2.5 MG/3ML) 0.083% nebulizer solution Take 3 mLs (2.5 mg total) by nebulization every 6 (six) hours as needed for wheezing or shortness of breath.  Marland Kitchen albuterol (VENTOLIN HFA) 108 (90 Base) MCG/ACT inhaler Inhale 2 puffs into the lungs every 6 (six) hours as needed for wheezing or shortness of breath.  . cefdinir (OMNICEF) 300 MG capsule Take 1 capsule (300 mg total) by mouth 2 (two) times daily. Patient to fill if no improvement in next 2-3 days  . Evolocumab (REPATHA) 140 MG/ML SOSY Inject 140 mg into the skin every 14 (fourteen) days.  Marland Kitchen FLUoxetine (PROZAC) 20 MG capsule Take 1 capsule (20 mg total) by mouth daily.  Marland Kitchen levothyroxine (SYNTHROID) 75 MCG tablet Take 1 tablet (75 mcg total) by mouth  daily.  Marland Kitchen loratadine (CLARITIN) 10 MG tablet Take 10 mg by mouth daily.  Marland Kitchen losartan (COZAAR) 25 MG tablet Take 1 tablet (25 mg total) by mouth daily.  Earney Navy Bicarbonate (ZEGERID OTC PO) Take by mouth.  . Semaglutide,0.25 or 0.5MG /DOS, (OZEMPIC, 0.25 OR 0.5 MG/DOSE,) 2 MG/1.5ML SOPN Inject 0.25 mg into the skin once a week.  . umeclidinium-vilanterol (ANORO ELLIPTA) 62.5-25 MCG/INH AEPB INHALE 1 PUFF BY MOUTH EVERY DAY   No facility-administered encounter medications on file as of 11/13/2020.    Review of Systems  Constitutional: Negative for chills and fever.  Eyes: Negative for redness and visual disturbance.  Respiratory: Negative for chest tightness and shortness of breath.   Cardiovascular: Negative for chest pain and leg swelling.  Gastrointestinal: Positive for abdominal distention and diarrhea. Negative for abdominal pain, constipation, nausea and vomiting.  Musculoskeletal: Negative for back pain and gait problem.  Skin: Negative for rash.  Neurological: Negative for light-headedness and headaches.  Psychiatric/Behavioral: Negative for agitation and behavioral problems.  All other systems reviewed and are negative.   Observations/Objective: Patient sounds comfortable and in no acute distress.   Assessment and Plan: Problem List Items Addressed This Visit   None   Visit Diagnoses    C. difficile colitis    -  Primary   Relevant Medications   metroNIDAZOLE (FLAGYL) 500 MG tablet      Husband tested positive for C. difficile and then she started having symptoms very similar, 10-15 loose bowel  movements per day. Sounds like C. difficile and we will go ahead and treat as such and recommended probiotics and Greek yogurt to go with it. Follow up plan: Return if symptoms worsen or fail to improve.     I discussed the assessment and treatment plan with the patient. The patient was provided an opportunity to ask questions and all were answered. The patient agreed  with the plan and demonstrated an understanding of the instructions.   The patient was advised to call back or seek an in-person evaluation if the symptoms worsen or if the condition fails to improve as anticipated.  The above assessment and management plan was discussed with the patient. The patient verbalized understanding of and has agreed to the management plan. Patient is aware to call the clinic if symptoms persist or worsen. Patient is aware when to return to the clinic for a follow-up visit. Patient educated on when it is appropriate to go to the emergency department.    I provided 9 minutes of non-face-to-face time during this encounter.    Worthy Rancher, MD

## 2020-11-17 LAB — HM PAP SMEAR: HM Pap smear: NEGATIVE

## 2020-12-01 ENCOUNTER — Ambulatory Visit (INDEPENDENT_AMBULATORY_CARE_PROVIDER_SITE_OTHER): Payer: Medicare Other | Admitting: Family

## 2020-12-01 ENCOUNTER — Other Ambulatory Visit: Payer: Self-pay

## 2020-12-01 ENCOUNTER — Encounter: Payer: Self-pay | Admitting: Family

## 2020-12-01 VITALS — BP 95/65 | HR 72 | Temp 98.1°F | Ht 62.0 in | Wt 152.6 lb

## 2020-12-01 DIAGNOSIS — R197 Diarrhea, unspecified: Secondary | ICD-10-CM

## 2020-12-01 DIAGNOSIS — R109 Unspecified abdominal pain: Secondary | ICD-10-CM | POA: Diagnosis not present

## 2020-12-01 DIAGNOSIS — R1011 Right upper quadrant pain: Secondary | ICD-10-CM | POA: Diagnosis not present

## 2020-12-01 LAB — MICROSCOPIC EXAMINATION
RBC, Urine: NONE SEEN /hpf (ref 0–2)
WBC, UA: NONE SEEN /hpf (ref 0–5)

## 2020-12-01 LAB — URINALYSIS, COMPLETE
Bilirubin, UA: NEGATIVE
Glucose, UA: NEGATIVE
Ketones, UA: NEGATIVE
Leukocytes,UA: NEGATIVE
Nitrite, UA: NEGATIVE
Protein,UA: NEGATIVE
RBC, UA: NEGATIVE
Specific Gravity, UA: 1.02 (ref 1.005–1.030)
Urobilinogen, Ur: 0.2 mg/dL (ref 0.2–1.0)
pH, UA: 5.5 (ref 5.0–7.5)

## 2020-12-01 NOTE — Progress Notes (Signed)
Subjective:    Patient ID: Stacy [name], female    DOB: May 08, 1956, 65 y.o.   MRN: [id number]  Chief Complaint  Patient presents with  . Flank Pain    Right side worse at night. Patient has hx of fatty liver   . Diarrhea    Dettinger gave flygal and she finished   . Gas    HPI Pt presents to the office with worsening GERD, diarrhea, RUQ pain that radiates to her back that started a month ago. She reports her husband was diagnosed was C Diff on 11/12/20. She had a telephone visit on 11/13/20 and diagnosed with C diff was given Flagyl 500 mg for 2 weeks.  Reports 6-10 stools a day.   Denies any fever. She reports her symptoms have not worsen or improved.   Review of Systems  Gastrointestinal: Positive for diarrhea.  All other systems reviewed and are negative.      Objective:   Physical Exam Vitals reviewed.  Constitutional:      General: She is not in acute distress.    Appearance: She is well-developed and well-nourished.  HENT:     Head: Normocephalic and atraumatic.     Right Ear: Tympanic membrane normal.     Left Ear: Tympanic membrane normal.     Mouth/Throat:     Mouth: Oropharynx is clear and moist.  Eyes:     Pupils: Pupils are equal, round, and reactive to light.  Neck:     Thyroid: No thyromegaly.  Cardiovascular:     Rate and Rhythm: Normal rate and regular rhythm.     Pulses: Intact distal pulses.     Heart sounds: Normal heart sounds. No murmur heard.   Pulmonary:     Effort: Pulmonary effort is normal. No respiratory distress.     Breath sounds: Normal breath sounds. No wheezing.  Abdominal:     General: Bowel sounds are normal. There is no distension.     Palpations: Abdomen is soft.     Tenderness: There is abdominal tenderness (RUQ).  Musculoskeletal:        General: No tenderness or edema. Normal range of motion.     Cervical back: Normal range of motion and neck supple.  Skin:    General: Skin is warm and dry.  Neurological:      Mental Status: She is alert and oriented to person, place, and time.     Cranial Nerves: No cranial nerve deficit.     Deep Tendon Reflexes: Reflexes are normal and symmetric.  Psychiatric:        Mood and Affect: Mood and affect normal.        Behavior: Behavior normal.        Thought Content: Thought content normal.        Judgment: Judgment normal.       BP 95/65   Pulse 72   Temp 98.1 F (36.7 C) (Temporal)   Ht '5\' 2"'  (1.575 m)   Wt 152 lb 9.6 oz (69.2 kg)   SpO2 98%   BMI 27.91 kg/m      Assessment & Plan:  MALEIA WEEMS comes in today with chief complaint of Flank Pain (Right side worse at night. Patient has hx of fatty liver ), Diarrhea (Dettinger gave flygal and she finished ), and Gas   Diagnosis and orders addressed:  1. Flank pain - Urinalysis, Complete - Urine Culture - CBC with Differential/Platelet - BMP8+EGFR - Hepatic function panel  2.  Diarrhea, unspecified type - CBC with Differential/Platelet - BMP8+EGFR - Hepatic function panel  3. RUQ pain - CBC with Differential/Platelet - BMP8+EGFR - Hepatic function panel   I believe this is C Diff, given spouse was treated. She did fail Flagy and would need oral Vancomycin. She wants to hold off on any antibiotics at this time, until her test results return.  BRAT diet Force fluids Use Clorox to wipe bathroom down  Labs pending Health Maintenance reviewed Diet and exercise encouraged  Follow up plan: If symptoms worsen and keep PCP   Evelina Dun, FNP

## 2020-12-01 NOTE — Patient Instructions (Signed)
https://www.cdc.gov/cdiff/index.html">  Clostridioides Difficile Infection Clostridioides difficile infection, also known as C. difficile or C. diff infection, happens when too much C. diff bacteria grows. This can cause severe diarrhea and inflammation of the colon (colitis). It is linked to recent use of antibiotic medicine. This infection can be passed from person to person (is contagious). You also may be exposed to the bacteria from contact with food, water, or surfaces that have the bacteria on them. What are the causes? Certain bacteria live in the colon and help to digest food. This infection develops when the balance of helpful bacteria in the colon changes and the C. diff bacteria grow out of control. This is often caused by taking antibiotics. What increases the risk? You may be more likely to develop this condition if you:  Take certain antibiotics that kill many types of bacteria or take antibiotics for a long time.  Have an extended stay in a hospital or long-term care facility.  Are older than age 70.  Have had a C. diff infection before or have been exposed to C. diff bacteria.  Have a weakened disease-fighting system (immune system).  Take a medicine to reduce stomach acid, such as a proton pump inhibitor, for a long time.  Have a serious underlying condition, such as colon cancer or inflammatory bowel disease (IBD).  Have had a gastrointestinal (GI) tract procedure. What are the signs or symptoms? Symptoms of this condition include:  Diarrhea (three or more times a day) for several days.  Fever.  Loss of appetite.  Nausea.  Swelling, pain, cramping, or tenderness in the abdomen. How is this diagnosed? This condition is diagnosed with:  Your medical history and a physical exam.  Tests, which may include: ? A test for C. diff in your stool (feces). ? Blood tests. ? Imaging tests, such as a CT scan of your abdomen.  A procedure in which your colon is  examined. This is rare. How is this treated? Treatment for this condition may include:  Stopping the antibiotics that you were taking when the C. diff infection began. Do this only as told by your health care provider.  Taking certain antibiotics to stop C. diff growth.  Taking stool from a healthy person and placing it into your colon (fecal transplant). This may be done if the infection keeps coming back.  Having surgery to remove the infected part of the colon. This is rare. Follow these instructions at home: Medicines  Take over-the-counter and prescription medicines only as told by your health care provider.  Take antibiotic medicine as told by your health care provider. Do not stop taking the antibiotic even if you start to feel better.  Do not treat diarrhea with medicines unless your health care provider tells you to. Eating and drinking  Follow instructions from your health care provider about eating and drinking restrictions.  Eat bland foods in small amounts that are easy to digest. These include bananas, applesauce, rice, lean meats, toast, and crackers.  Follow instructions on replacing body fluid that has been lost (rehydrate). This may include: ? Drinking clear fluids, such as water, clear fruit juice that is diluted with water, and low-calorie sports drinks. ? Sucking on ice chips. ? Taking an oral rehydration solution (ORS). This drink is sold at pharmacies and retail stores.  Avoid milk, caffeine, and alcohol.  Drink enough fluid to keep your urine pale yellow.   General instructions  Wash your hands often with soap and water for at least  20 seconds. Bathe or shower using soap and water daily.  Return to your normal activities as told by your health care provider. Ask your health care provider what activities are safe for you.  Be sure your home is clean before you leave the hospital or clinic to go home. Then continue daily cleaning for at least a  week.  Keep all follow-up visits. This is important. How is this prevented? Hand hygiene  Wash your hands with soap and water for at least 20 seconds before preparing food and after using the bathroom. Make sure the people you live with also wash their hands often with soap and water for at least 20 seconds.  If you are being treated at a hospital or clinic, make sure that all health care providers and visitors wash their hands with soap and water before touching you.   Contact precautions  Tell your health care team right away if you develop diarrhea while in a hospital or long-term care facility.  When visiting someone in a hospital or a long-term care facility, follow guidelines for wearing a gown, gloves, or other protective equipment.  If possible, avoid contact with people who have diarrhea.  Use a separate bathroom if you are sick and live with other people, if possible. Clean environment  Clean surfaces that are touched often every day. C. diff bacteria are killed only by cleaning products that contain 10% chlorine bleach solution. Be sure to: ? Read the product's label to make sure the product will kill the bacteria on the surface you are cleaning. ? Clean frequently touched surfaces, such as toilet seats and flush handles, bathtubs, sinks, doorknobs, and work surfaces.  If you are in the hospital, make sure that staff members clean the surfaces in your room daily. Let a staff person know right away if body fluids have splashed or spilled. Washing clothes and linens  Use a powder laundry detergent containing chlorine bleach instead of liquid detergent. Powder detergents contain chlorine bleach in low levels to help kill bacteria.  Run your empty washing machine on the hot setting once a month with enough detergent for a full load. This will kill any remaining C. diff bacteria. Contact a health care provider if:  Your symptoms do not get better, or they get worse, even with  treatment.  Your symptoms go away and then come back.  You have a fever.  You develop new symptoms. Get help right away if:  You have more pain or tenderness in your abdomen.  You have stool that is mostly bloody, or looks black and tarry.  You cannot eat or drink without vomiting.  You have signs of dehydration, such as: ? Dark urine, very little urine, or no urine. ? Cracked lips or dry mouth. ? Not making tears when you cry. ? Sunken eyes. ? Sleepiness. ? Weakness or dizziness. Summary  Clostridioides difficile infection, or C. diff infection, can cause severe diarrhea and inflammation of the colon (colitis). It is linked to recent antibiotic use.  C. diff infection can spread from person to person (is contagious). You also may be exposed to the bacteria from contact with food, water, or surfaces that have the bacteria on them.  This infection may be treated by stopping the antibiotics you were using when the infection began. Fecal transplant or surgery may be needed for repeat or severe infections.  Washing hands with soap and water for at least 20 seconds after you use the bathroom and before you  eat, and cleaning surfaces with a 10% bleach solution, can help prevent or limit spread of this infection. This information is not intended to replace advice given to you by your health care provider. Make sure you discuss any questions you have with your health care provider. Document Revised: 01/30/2020 Document Reviewed: 01/30/2020 Elsevier Patient Education  [place].

## 2020-12-02 ENCOUNTER — Other Ambulatory Visit: Payer: Medicare Other

## 2020-12-02 DIAGNOSIS — R197 Diarrhea, unspecified: Secondary | ICD-10-CM | POA: Diagnosis not present

## 2020-12-02 LAB — BMP8+EGFR
BUN/Creatinine Ratio: 12 (ref 12–28)
BUN: 9 mg/dL (ref 8–27)
CO2: 23 mmol/L (ref 20–29)
Calcium: 9.4 mg/dL (ref 8.7–10.3)
Chloride: 102 mmol/L (ref 96–106)
Creatinine, Ser: 0.77 mg/dL (ref 0.57–1.00)
GFR calc Af Amer: 96 mL/min/{1.73_m2} (ref >59)
GFR calc non Af Amer: 83 mL/min/{1.73_m2} (ref >59)
Glucose: 119 mg/dL — ABNORMAL HIGH (ref 65–99)
Potassium: 3.9 mmol/L (ref 3.5–5.2)
Sodium: 141 mmol/L (ref 134–144)

## 2020-12-02 LAB — CBC WITH DIFFERENTIAL/PLATELET
Basophils Absolute: 0.1 10*3/uL (ref 0.0–0.2)
Basos: 1 %
EOS (ABSOLUTE): 0.2 10*3/uL (ref 0.0–0.4)
Eos: 3 %
Hematocrit: 43.2 % (ref 34.0–46.6)
Hemoglobin: 14.6 g/dL (ref 11.1–15.9)
Immature Grans (Abs): 0 10*3/uL (ref 0.0–0.1)
Immature Granulocytes: 0 %
Lymphocytes Absolute: 3 10*3/uL (ref 0.7–3.1)
Lymphs: 33 %
MCH: 32 pg (ref 26.6–33.0)
MCHC: 33.8 g/dL (ref 31.5–35.7)
MCV: 95 fL (ref 79–97)
Monocytes Absolute: 1 10*3/uL — ABNORMAL HIGH (ref 0.1–0.9)
Monocytes: 11 %
Neutrophils Absolute: 4.8 10*3/uL (ref 1.4–7.0)
Neutrophils: 52 %
Platelets: 335 10*3/uL (ref 150–450)
RBC: 4.56 x10E6/uL (ref 3.77–5.28)
RDW: 12.7 % (ref 11.7–15.4)
WBC: 9.2 10*3/uL (ref 3.4–10.8)

## 2020-12-02 LAB — HEPATIC FUNCTION PANEL
ALT: 27 IU/L (ref 0–32)
AST: 20 IU/L (ref 0–40)
Albumin: 4.4 g/dL (ref 3.8–4.8)
Alkaline Phosphatase: 81 IU/L (ref 44–121)
Bilirubin Total: 0.4 mg/dL (ref 0.0–1.2)
Bilirubin, Direct: 0.1 mg/dL (ref 0.00–0.40)
Total Protein: 6.2 g/dL (ref 6.0–8.5)

## 2020-12-04 LAB — URINE CULTURE

## 2020-12-06 LAB — CDIFF NAA+O+P+STOOL CULTURE
E coli, Shiga toxin Assay: NEGATIVE
Toxigenic C. Difficile by PCR: POSITIVE — AB

## 2020-12-07 ENCOUNTER — Telehealth: Payer: Self-pay

## 2020-12-07 ENCOUNTER — Other Ambulatory Visit: Payer: Self-pay | Admitting: Family

## 2020-12-07 DIAGNOSIS — A0472 Enterocolitis due to Clostridium difficile, not specified as recurrent: Secondary | ICD-10-CM

## 2020-12-07 MED ORDER — VANCOMYCIN 50 MG/ML ORAL SOLUTION: 125.0000 mg | Freq: Four times a day (QID) | ORAL | 0 refills | Status: DC

## 2020-12-07 MED ORDER — VANCOMYCIN HCL 125 MG PO CAPS: 125.0000 mg | ORAL_CAPSULE | Freq: Four times a day (QID) | ORAL | 0 refills | Status: AC

## 2020-12-07 NOTE — Telephone Encounter (Signed)
I did not see patient.  Will cc to [facility].

## 2020-12-07 NOTE — Telephone Encounter (Signed)
RX changed to pills from liquid.

## 2020-12-07 NOTE — Telephone Encounter (Signed)
Patient aware and verbalized understanding. °

## 2020-12-07 NOTE — Telephone Encounter (Signed)
Patient aware.

## 2020-12-07 NOTE — Telephone Encounter (Signed)
Pt is calling back about her test results

## 2020-12-07 NOTE — Telephone Encounter (Signed)
Please review stool cultures.

## 2020-12-24 ENCOUNTER — Ambulatory Visit (INDEPENDENT_AMBULATORY_CARE_PROVIDER_SITE_OTHER): Payer: Medicare Other | Admitting: *Deleted

## 2020-12-24 ENCOUNTER — Ambulatory Visit: Payer: Medicare Other

## 2020-12-24 DIAGNOSIS — Z0000 Encounter for general adult medical examination without abnormal findings: Secondary | ICD-10-CM

## 2020-12-24 NOTE — Progress Notes (Signed)
MEDICARE ANNUAL WELLNESS VISIT  12/24/2020  Telephone Visit Disclaimer This Medicare AWV was conducted by telephone due to national recommendations for restrictions regarding the COVID-19 Pandemic (e.g. social distancing).  I verified, using two identifiers, that I am speaking with Stacy [name] or their authorized healthcare agent. I discussed the limitations, risks, security, and privacy concerns of performing an evaluation and management service by telephone and the potential availability of an in-person appointment in the future. The patient expressed understanding and agreed to proceed.  Location of Patient: Home  Location of Provider (nurse):  [facility]  Subjective:    Stacy [name] is a 65 y.o. female patient of [name], DO who had a Medicare Annual Wellness Visit today via telephone. Stacy [name] is Disabled and lives with their spouse. she has 2 children. she reports that she is socially active and does interact with friends/family regularly. she is minimally physically active and enjoys gardening and reading.  Patient Care Team: [name], DO as PCP - General (Family Medicine) Lavera Guise, [facility] (Pharmacist)  Advanced Directives 12/24/2020 09/19/2020 06/12/2015  Does Patient Have a Medical Advance Directive? No No No  Would patient like information on creating a medical advance directive? Yes (MAU/Ambulatory/Procedural Areas - Information given) No - Patient declined Yes - Educational materials given    Hospital Utilization Over the Past 12 Months: # of hospitalizations or ER visits: 0 # of surgeries: 0  Review of Systems    Patient reports that her overall health is unchanged compared to last year.  History obtained from chart review and the patient  Patient Reported Readings (BP, Pulse, CBG, Weight, etc) none  Pain Assessment Pain : 0-10 Pain Score: 5  Pain Location: Hip Pain Orientation: Right Pain Descriptors /  Indicators: Aching Pain Onset: More than a month ago Pain Frequency: Constant     Current Medications & Allergies (verified) Allergies as of 12/24/2020      Reactions   Avelox [moxifloxacin Hcl In Nacl] Hives   Ciprofloxacin    REACTION: Rash   Livalo [pitavastatin]    Metformin And Related    kidney   Moxifloxacin    REACTION: RASH   Bactrim [sulfamethoxazole-trimethoprim] Rash   Statins Other (See Comments)   myalgias   Sulfamethoxazole-trimethoprim Rash   REACTION: Rash      Medication List       Accurate as of December 24, 2020  3:24 PM. If you have any questions, ask your nurse or doctor.        albuterol 108 (90 Base) MCG/ACT inhaler Commonly known as: Ventolin HFA Inhale 2 puffs into the lungs every 6 (six) hours as needed for wheezing or shortness of breath.   albuterol (2.5 MG/3ML) 0.083% nebulizer solution Commonly known as: PROVENTIL Take 3 mLs (2.5 mg total) by nebulization every 6 (six) hours as needed for wheezing or shortness of breath.   Anoro Ellipta 62.5-25 MCG/INH Aepb Generic drug: umeclidinium-vilanterol INHALE 1 PUFF BY MOUTH EVERY DAY   FLUoxetine 20 MG capsule Commonly known as: PROzac Take 1 capsule (20 mg total) by mouth daily.   levothyroxine 75 MCG tablet Commonly known as: SYNTHROID TAKE 1 TABLET BY MOUTH EVERY DAY   loratadine 10 MG tablet Commonly known as: CLARITIN Take 10 mg by mouth daily.   losartan 25 MG tablet Commonly known as: COZAAR Take 1 tablet (25 mg total) by mouth daily.   Ozempic (0.25 or 0.5 MG/DOSE) 2 MG/1.5ML Sopn Generic drug:  Semaglutide(0.25 or 0.5MG /DOS) Inject 0.25 mg into the skin once a week.   Repatha 140 MG/ML Sosy Generic drug: Evolocumab Inject 140 mg into the skin every 14 (fourteen) days.   ZEGERID OTC PO Take by mouth.       History (reviewed): Past Medical History:  Diagnosis Date  . Asthma   . COPD, mild ([place])    Spirometerey 3/08  . Diabetes mellitus without complication ([place])    . Fatty liver disease, nonalcoholic   . GERD (gastroesophageal reflux disease)   . Hyperlipidemia   . Hypertension   . Hypothyroid Since 2003  . IBS (irritable bowel syndrome)   . Perimenopausal    Past Surgical History:  Procedure Laterality Date  . ABLATION    . CARDIAC CATHETERIZATION N/A 06/12/2015   Procedure: Left Heart Cath and Coronary Angiography;  Surgeon: Burnell Blanks, MD;  Location: [facility];  Service: Cardiovascular;  Laterality: N/A;  . CESAREAN SECTION    . LTCS     x2  . SPIROMETRY  01/05/2007   FVC 68% predicted; FEV1 44% predicted; FEV1/FVC 63% predicted = moderate obstruction with low vital capacity possibly due to restriction  . TUBAL LIGATION     Family History  Problem Relation Age of Onset  . Hypertension Father   . Hyperlipidemia Father   . Heart disease Father        Died at age 44 of MI  . Hypothyroidism Father   . Diabetes Maternal Grandmother   . Raynaud syndrome Sister   . Rheum arthritis Sister   . Thyroid disease Sister   . CAD Brother        has 4 stents  . Rheum arthritis Brother   . Pulmonary fibrosis Brother   . Heart disease Sister 36       h/o MI @age  10  . Thyroid disease Sister   . Heart disease Mother   . Hypothyroidism Mother   . Macular degeneration Mother   . Breast cancer Neg Hx   . Ovarian cancer Neg Hx   . Prostate cancer Neg Hx   . Colon cancer Neg Hx   . Stomach cancer Neg Hx   . Esophageal cancer Neg Hx    Social History   Socioeconomic History  . Marital status: Married    Spouse name: Not on file  . Number of children: 2  . Years of education: Not on file  . Highest education level: Associate degree: academic program  Occupational History  . Occupation: Works in Science writer  . Occupation: Disabled  Tobacco Use  . Smoking status: Former Smoker    Packs/day: 0.50    Years: 30.00    Pack years: 15.00    Types: Cigarettes    Quit date: 10/25/2007    Years since quitting: 13.1  . Smokeless  tobacco: Never Used  Vaping Use  . Vaping Use: Never used  Substance and Sexual Activity  . Alcohol use: Yes    Alcohol/week: 1.0 standard drink    Types: 1 Glasses of wine per week    Comment: Occasional-once a year  . Drug use: No  . Sexual activity: Yes    Birth control/protection: Surgical  Other Topics Concern  . Not on file  Social History Narrative   Remarried.    Previous occupation: Customer service manager.       Moved from [facility] in [place]   2 children, has stepchildren and grandchildren   Does not exercise   Social Determinants of Health  Financial Resource Strain: Not on file  Food Insecurity: Not on file  Transportation Needs: Not on file  Physical Activity: Not on file  Stress: Not on file  Social Connections: Not on file    Activities of Daily Living In your present state of health, do you have any difficulty performing the following activities: 12/24/2020  Hearing? N  Vision? N  Comment Wears glasses  Difficulty concentrating or making decisions? N  Walking or climbing stairs? N  Dressing or bathing? N  Doing errands, shopping? N  Preparing Food and eating ? N  Using the Toilet? N  In the past six months, have you accidently leaked urine? Y  Comment Stress incontience  Do you have problems with loss of bowel control? N  Managing your Medications? N  Managing your Finances? N  Housekeeping or managing your Housekeeping? N  Some recent data might be hidden    Patient Education/ Literacy How often do you need to have someone help you when you read instructions, pamphlets, or other written materials from your doctor or pharmacy?: 1 - Never What is the last grade level you completed in school?: Diploma in practical nursing  Exercise Current Exercise Habits: Home exercise routine, Type of exercise: walking, Time (Minutes): 20, Frequency (Times/Week): 2, Weekly Exercise (Minutes/Week): 40, Intensity: Mild, Exercise limited by: respiratory conditions(s)  Diet Patient  reports consuming 2 meals a day and 1-2 snack(s) a day Patient reports that her primary diet is: Regular Patient reports that she does have regular access to food.   Depression Screen PHQ 2/9 Scores 12/24/2020 12/01/2020 10/01/2020 08/17/2020 03/24/2020 08/23/2018 06/26/2018  PHQ - 2 Score 0 0 0 0 0 0 2  PHQ- 9 Score - - - - 0 - 9     Fall Risk Fall Risk  12/24/2020 12/01/2020 10/01/2020 08/17/2020 08/23/2018  Falls in the past year? 0 0 0 0 No  Number falls in past yr: - - - - -  Injury with Fall? - - - - -     Objective:  Stacy [name] seemed alert and oriented and she participated appropriately during our telephone visit.  Blood Pressure Weight BMI  BP Readings from Last 3 Encounters:  12/01/20 95/65  10/01/20 113/71  09/19/20 117/75   Wt Readings from Last 3 Encounters:  12/01/20 152 lb 9.6 oz (69.2 kg)  10/01/20 155 lb 3.2 oz (70.4 kg)  09/19/20 151 lb (68.5 kg)   BMI Readings from Last 1 Encounters:  12/01/20 27.91 kg/m    *Unable to obtain current vital signs, weight, and BMI due to telephone visit type  Hearing/Vision  . Stacy [name] did not seem to have difficulty with hearing/understanding during the telephone conversation . Reports that she has had a formal eye exam by an eye care professional within the past year . Reports that she has not had a formal hearing evaluation within the past year *Unable to fully assess hearing and vision during telephone visit type  Cognitive Function: 6CIT Screen 12/24/2020  What Year? 0 points  What month? 0 points  What time? 0 points  Count back from 20 0 points  Months in reverse 0 points  Repeat phrase 0 points  Total Score 0   (Normal:0-7, Significant for Dysfunction: >8)  Normal Cognitive Function Screening: Yes   Immunization & Health Maintenance Record Immunization History  Administered Date(s) Administered  . H1N1 10/06/2008  . Influenza Inj Mdck Quad With Preservative 08/06/2018  . Influenza Split 08/25/2017, 08/06/2018,  07/23/2019  .  Influenza Whole 08/01/2007, 08/04/2009  . Influenza,inj,Quad PF,6+ Mos 08/25/2017  . Influenza-Unspecified 08/06/2018, 08/24/2020  . PFIZER(Purple Top)SARS-COV-2 Vaccination 01/08/2020, 01/29/2020, 08/24/2020  . Pneumococcal Conjugate-13 06/08/2018  . Pneumococcal Polysaccharide-23 08/01/2007  . Td 08/01/2007  . Tdap 01/23/2018    Health Maintenance  Topic Date Due  . FOOT EXAM  Never done  . HIV Screening  Never done  . PAP SMEAR-Modifier  11/26/2019  . HEMOGLOBIN A1C  02/09/2021  . COVID-19 Vaccine (4 - Booster for Pfizer series) 02/21/2021  . MAMMOGRAM  04/22/2021  . OPHTHALMOLOGY EXAM  06/30/2021  . COLONOSCOPY (Pts 45-65yrs Insurance coverage will need to be confirmed)  09/05/2023  . TETANUS/TDAP  01/24/2028  . INFLUENZA VACCINE  Completed  . PNEUMOCOCCAL POLYSACCHARIDE VACCINE AGE 16-64 HIGH RISK  Completed  . Hepatitis C Screening  Completed  . HPV VACCINES  Aged Out       Assessment  This is a routine wellness examination for Stacy [name].  Health Maintenance: Due or Overdue Health Maintenance Due  Topic Date Due  . FOOT EXAM  Never done  . HIV Screening  Never done  . PAP SMEAR-Modifier  11/26/2019    Stacy [name] does not need a referral for Community Assistance: Care Management:   no Social Work:    no Prescription Assistance:  no Nutrition/Diabetes Education:  no   Plan:  Personalized Goals Goals Addressed            This Visit's Progress   . AWV       12/24/2020 AWV Goal: Exercise for General Health   Patient will verbalize understanding of the benefits of increased physical activity:  Exercising regularly is important. It will improve your overall fitness, flexibility, and endurance.  Regular exercise also will improve your overall health. It can help you control your weight, reduce stress, and improve your bone density.  Over the next year, patient will increase physical activity as tolerated with a goal of at least 150  minutes of moderate physical activity per week.   You can tell that you are exercising at a moderate intensity if your heart starts beating faster and you start breathing faster but can still hold a conversation.  Moderate-intensity exercise ideas include:  Walking 1 mile (1.6 km) in about 15 minutes  Biking  Hiking  Golfing  Dancing  Water aerobics  Patient will verbalize understanding of everyday activities that increase physical activity by providing examples like the following: ? Yard work, such as: ? Pushing a Conservation officer, nature ? Raking and bagging leaves ? Washing your car ? Pushing a stroller ? Shoveling snow ? Gardening ? Washing windows or floors  Patient will be able to explain general safety guidelines for exercising:   Before you start a new exercise program, talk with your health care provider.  Do not exercise so much that you hurt yourself, feel dizzy, or get very short of breath.  Wear comfortable clothes and wear shoes with good support.  Drink plenty of water while you exercise to prevent dehydration or heat stroke.  Work out until your breathing and your heartbeat get faster.       Personalized Health Maintenance & Screening Recommendations  Screening mammography Screening Pap smear and pelvic exam   Lung Cancer Screening Recommended: no (Low Dose CT Chest recommended if Age 28-80 years, 30 pack-year currently smoking OR have quit w/in past 15 years) Hepatitis C Screening recommended: no HIV Screening recommended: no  Advanced Directives: Written information was not prepared  per patient's request.  Referrals & Orders No orders of the defined types were placed in this encounter.   Follow-up Plan . Follow-up with [name], DO as planned . AVS printed and mailed to patient    I have personally reviewed and noted the following in the patient's chart:   . Medical and social history . Use of alcohol, tobacco or illicit drugs  . Current  medications and supplements . Functional ability and status . Nutritional status . Physical activity . Advanced directives . List of other physicians . Hospitalizations, surgeries, and ER visits in previous 12 months . Vitals . Screenings to include cognitive, depression, and falls . Referrals and appointments  In addition, I have reviewed and discussed with Stacy [name] certain preventive protocols, quality metrics, and best practice recommendations. A written personalized care plan for preventive services as well as general preventive health recommendations is available and can be mailed to the patient at her request.      Lynnea Ferrier, LPN  03/26/1496

## 2020-12-29 ENCOUNTER — Other Ambulatory Visit: Payer: Self-pay | Admitting: Family Medicine

## 2020-12-29 ENCOUNTER — Other Ambulatory Visit: Payer: Self-pay

## 2020-12-29 ENCOUNTER — Encounter: Payer: Self-pay | Admitting: Family Medicine

## 2020-12-29 ENCOUNTER — Ambulatory Visit (INDEPENDENT_AMBULATORY_CARE_PROVIDER_SITE_OTHER): Payer: Medicare Other | Admitting: Family Medicine

## 2020-12-29 ENCOUNTER — Ambulatory Visit (HOSPITAL_COMMUNITY)
Admission: RE | Admit: 2020-12-29 | Discharge: 2020-12-29 | Disposition: A | Discharge status: Home / Self Care | Payer: Medicare Other | Source: Ambulatory Visit | Attending: Family Medicine | Admitting: Family Medicine

## 2020-12-29 VITALS — BP 108/69 | HR 66 | Temp 97.5°F | Ht 62.0 in | Wt 153.0 lb

## 2020-12-29 DIAGNOSIS — Z8619 Personal history of other infectious and parasitic diseases: Secondary | ICD-10-CM | POA: Insufficient documentation

## 2020-12-29 DIAGNOSIS — R140 Abdominal distension (gaseous): Secondary | ICD-10-CM

## 2020-12-29 DIAGNOSIS — R109 Unspecified abdominal pain: Secondary | ICD-10-CM | POA: Diagnosis not present

## 2020-12-29 DIAGNOSIS — R1031 Right lower quadrant pain: Secondary | ICD-10-CM | POA: Diagnosis not present

## 2020-12-29 DIAGNOSIS — R195 Other fecal abnormalities: Secondary | ICD-10-CM

## 2020-12-29 DIAGNOSIS — K760 Fatty (change of) liver, not elsewhere classified: Secondary | ICD-10-CM | POA: Diagnosis not present

## 2020-12-29 LAB — MICROSCOPIC EXAMINATION
Epithelial Cells (non renal): NONE SEEN /hpf (ref 0–10)
RBC, Urine: NONE SEEN /hpf (ref 0–2)
WBC, UA: NONE SEEN /hpf (ref 0–5)

## 2020-12-29 LAB — URINALYSIS, COMPLETE
Bilirubin, UA: NEGATIVE
Glucose, UA: NEGATIVE
Ketones, UA: NEGATIVE
Leukocytes,UA: NEGATIVE
Nitrite, UA: NEGATIVE
Protein,UA: NEGATIVE
RBC, UA: NEGATIVE
Specific Gravity, UA: <1.005 — ABNORMAL LOW (ref 1.005–1.030)
Urobilinogen, Ur: 0.2 mg/dL (ref 0.2–1.0)
pH, UA: 5.5 (ref 5.0–7.5)

## 2020-12-29 MED ORDER — RIFAXIMIN 200 MG PO TABS: 400.0000 mg | ORAL_TABLET | Freq: Three times a day (TID) | ORAL | 0 refills | Status: AC

## 2020-12-29 MED ORDER — IOHEXOL 300 MG/ML  SOLN
100.0000 mL | Freq: Once | INTRAMUSCULAR | Status: AC | PRN
  Administered 2020-12-29: 100 mL via INTRAVENOUS

## 2020-12-29 MED ORDER — IOHEXOL 9 MG/ML PO SOLN
ORAL | Status: AC
  Filled 2020-12-29: qty 1000

## 2020-12-29 NOTE — Progress Notes (Signed)
Subjective: CC: RLQ pain PCP: [name], DO IDP:[id number] Stacy [name] is Stacy 65 y.o. female presenting to clinic today for:  1. RLQ pain Onset October 29, 2020.  She was diagnosed with C. difficile and was treated with both Flagyl and later vancomycin.  She notes that the watery, foul-smelling stools have resolved but she continues to have several loose stools per day.  She had scant hematochezia today.  She reports nausea, abdominal bloating.  Of note, she was also treated for Stacy streptococcal UTI.  She does not report any dysuria but she does have some lower pelvic pain.   ROS: Per HPI  Allergies  Allergen Reactions  . Avelox [Moxifloxacin Hcl In Nacl] Hives  . Ciprofloxacin     REACTION: Rash  . Livalo [Pitavastatin]   . Metformin And Related     kidney  . Moxifloxacin     REACTION: RASH  . Bactrim [Sulfamethoxazole-Trimethoprim] Rash  . Statins Other (See Comments)    myalgias  . Sulfamethoxazole-Trimethoprim Rash    REACTION: Rash   Past Medical History:  Diagnosis Date  . Asthma   . COPD, mild ([place])    Spirometerey 3/08  . Diabetes mellitus without complication ([place])   . Fatty liver disease, nonalcoholic   . GERD (gastroesophageal reflux disease)   . Hyperlipidemia   . Hypertension   . Hypothyroid Since 2003  . IBS (irritable bowel syndrome)   . Perimenopausal     Current Outpatient Medications:  .  albuterol (VENTOLIN HFA) 108 (90 Base) MCG/ACT inhaler, Inhale 2 puffs into the lungs every 6 (six) hours as needed for wheezing or shortness of breath., Disp: 18 g, Rfl: 11 .  Evolocumab (REPATHA) 140 MG/ML SOSY, Inject 140 mg into the skin every 14 (fourteen) days., Disp: 2.1 mL, Rfl: 4 .  FLUoxetine (PROZAC) 20 MG capsule, Take 1 capsule (20 mg total) by mouth daily., Disp: 90 capsule, Rfl: 3 .  levothyroxine (SYNTHROID) 75 MCG tablet, TAKE 1 TABLET BY MOUTH EVERY DAY, Disp: 90 tablet, Rfl: 3 .  loratadine (CLARITIN) 10 MG tablet, Take 10 mg by mouth daily., Disp:  , Rfl:  .  losartan (COZAAR) 25 MG tablet, Take 1 tablet (25 mg total) by mouth daily., Disp: 90 tablet, Rfl: 3 .  Omeprazole-Sodium Bicarbonate (ZEGERID OTC PO), Take by mouth., Disp: , Rfl:  .  Semaglutide,0.25 or 0.5MG /DOS, (OZEMPIC, 0.25 OR 0.5 MG/DOSE,) 2 MG/1.5ML SOPN, Inject 0.25 mg into the skin once Stacy week., Disp: , Rfl:  .  umeclidinium-vilanterol (ANORO ELLIPTA) 62.5-25 MCG/INH AEPB, INHALE 1 PUFF BY MOUTH EVERY DAY, Disp: 60 each, Rfl: 11 .  albuterol (PROVENTIL) (2.5 MG/3ML) 0.083% nebulizer solution, Take 3 mLs (2.5 mg total) by nebulization every 6 (six) hours as needed for wheezing or shortness of breath. (Patient not taking: Reported on 12/29/2020), Disp: 75 mL, Rfl: 0 Social History   Socioeconomic History  . Marital status: Married    Spouse name: Not on file  . Number of children: 2  . Years of education: Not on file  . Highest education level: Associate degree: academic program  Occupational History  . Occupation: Works in Science writer  . Occupation: Disabled  Tobacco Use  . Smoking status: Former Smoker    Packs/day: 0.50    Years: 30.00    Pack years: 15.00    Types: Cigarettes    Quit date: 10/25/2007    Years since quitting: 13.1  . Smokeless tobacco: Never Used  Vaping Use  . Vaping Use:  Never used  Substance and Sexual Activity  . Alcohol use: Yes    Alcohol/week: 1.0 standard drink    Types: 1 Glasses of wine per week    Comment: Occasional-once Stacy year  . Drug use: No  . Sexual activity: Yes    Birth control/protection: Surgical  Other Topics Concern  . Not on file  Social History Narrative   Remarried.    Previous occupation: Customer service manager.       Moved from [facility] in [place]   2 children, has stepchildren and grandchildren   Does not exercise   Social Determinants of Health   Financial Resource Strain: Not on file  Food Insecurity: Not on file  Transportation Needs: Not on file  Physical Activity: Not on file  Stress: Not on file  Social Connections: Not on  file  Intimate Partner Violence: Not on file   Family History  Problem Relation Age of Onset  . Hypertension Father   . Hyperlipidemia Father   . Heart disease Father        Died at age 63 of MI  . Hypothyroidism Father   . Diabetes Maternal Grandmother   . Raynaud syndrome Sister   . Rheum arthritis Sister   . Thyroid disease Sister   . CAD Brother        has 4 stents  . Rheum arthritis Brother   . Pulmonary fibrosis Brother   . Heart disease Sister 65       h/o MI @age  32  . Thyroid disease Sister   . Heart disease Mother   . Hypothyroidism Mother   . Macular degeneration Mother   . Breast cancer Neg Hx   . Ovarian cancer Neg Hx   . Prostate cancer Neg Hx   . Colon cancer Neg Hx   . Stomach cancer Neg Hx   . Esophageal cancer Neg Hx     Objective: Office vital signs reviewed. BP 108/69   Pulse 66   Temp (!) 97.5 F (36.4 C) (Temporal)   Ht 5\' 2"  (1.575 m)   Wt 153 lb (69.4 kg)   SpO2 98%   BMI 27.98 kg/m   Physical Examination:  General: Awake, alert, nontoxic, No acute distress HEENT: Normal; sclera white GI: soft, mildly distended, moderate tenderness to palpation in the right lower quadrant, mild tenderness palpation in the left lower quadrant and suprapubic space.  Bowel sounds present x4, no hepatomegaly, no splenomegaly, no masses GU: Right CVA tenderness present.  Suprapubic tenderness to palpation present.  Assessment/ Plan: 65 y.o. female   Right flank pain  Right lower quadrant abdominal pain - Plan: CT Abdomen Pelvis W Contrast  Abdominal distension (gaseous) - Plan: CT Abdomen Pelvis W Contrast  Passage of loose stools - Plan: Pancreatic Elastase, Fecal, Fecal fat, qualitative  History of Clostridioides difficile colitis - Plan: CT Abdomen Pelvis W Contrast  Uncertain etiology.  Urinalysis collected but showed no evidence of infection.  I reviewed her 12/01/2020 urine culture and this was positive for strep.  She is now status post treatment  with both Flagyl and vancomycin for C. difficile colitis.  I worry about the ongoing right lower quadrant pain and therefore have ordered Stacy CT abdomen pelvis to evaluate for possible complications including abscess.  We discussed potential for prolonged C. difficile treatment but it sounds like foul smelling stools and watery stools have totally resolved suggesting against ongoing C. difficile.  May need to consider treatment with Xifaxan for bacterial overgrowth.  Will await  CT abdomen pelvis results prior to prescribing.  No orders of the defined types were placed in this encounter.  No orders of the defined types were placed in this encounter.    [name], DO [place] [phone]

## 2020-12-29 NOTE — Patient Instructions (Signed)
Return stool studies ASAP  Your CT abdomen pelvis has been ordered.  We will determine need for ongoing antibiotics or other treatment pending this image

## 2020-12-30 ENCOUNTER — Other Ambulatory Visit: Payer: Medicare Other

## 2020-12-30 DIAGNOSIS — R195 Other fecal abnormalities: Secondary | ICD-10-CM | POA: Diagnosis not present

## 2020-12-31 ENCOUNTER — Encounter: Payer: Self-pay | Admitting: Family Medicine

## 2020-12-31 ENCOUNTER — Telehealth: Payer: Self-pay | Admitting: *Deleted

## 2020-12-31 DIAGNOSIS — R195 Other fecal abnormalities: Secondary | ICD-10-CM

## 2020-12-31 DIAGNOSIS — Z8619 Personal history of other infectious and parasitic diseases: Secondary | ICD-10-CM

## 2020-12-31 NOTE — Telephone Encounter (Signed)
Xifaxin 200 mg tab PA started  Key: [id number] Sent to plan for urgent review

## 2020-12-31 NOTE — Telephone Encounter (Signed)
Fax came in for more info on this PA   Filled out and faxed back - with note needs asap

## 2021-01-01 ENCOUNTER — Encounter: Payer: Self-pay | Admitting: *Deleted

## 2021-01-01 ENCOUNTER — Telehealth: Payer: Self-pay

## 2021-01-01 LAB — FECAL FAT, QUALITATIVE
Fat Qual Neutral, Stl: NORMAL
Fat Qual Total, Stl: NORMAL

## 2021-01-01 LAB — PANCREATIC ELASTASE, FECAL: Pancreatic Elastase, Fecal: 450 ug Elast./g (ref >200)

## 2021-01-01 NOTE — Telephone Encounter (Signed)
Pt aware of provider feedback and voiced understanding. 

## 2021-01-01 NOTE — Telephone Encounter (Signed)
There is nothing comparable to that.  Probiotics OTC are the only other option.

## 2021-01-01 NOTE — Telephone Encounter (Signed)
Approved on March 10 Request Reference Number: [id number]. XIFAXAN TAB 200MG  is approved through 10/23/2021. [facility] called and aware  Pt notified via mychart

## 2021-01-01 NOTE — Telephone Encounter (Signed)
Pt cannot afford rifaximin (XIFAXAN) 200 MG tablet too expensive with insurance. Pt is asking if something else can be called in. Please call back

## 2021-01-05 ENCOUNTER — Other Ambulatory Visit: Payer: Self-pay | Admitting: Family Medicine

## 2021-01-15 ENCOUNTER — Other Ambulatory Visit: Payer: Self-pay

## 2021-01-15 MED ORDER — ANORO ELLIPTA 62.5-25 MCG/INH IN AEPB: INHALATION_SPRAY | RESPIRATORY_TRACT | 4 refills | Status: DC

## 2021-01-19 ENCOUNTER — Other Ambulatory Visit: Payer: Self-pay | Admitting: *Deleted

## 2021-01-19 DIAGNOSIS — I10 Essential (primary) hypertension: Secondary | ICD-10-CM

## 2021-01-19 MED ORDER — LOSARTAN POTASSIUM 25 MG PO TABS: 25.0000 mg | ORAL_TABLET | Freq: Every day | ORAL | 1 refills | Status: DC

## 2021-01-19 MED ORDER — LEVOTHYROXINE SODIUM 75 MCG PO TABS: 75.0000 ug | ORAL_TABLET | Freq: Every day | ORAL | 2 refills | Status: DC

## 2021-01-19 NOTE — Addendum Note (Signed)
Addended by: Antonietta Barcelona D on: 01/19/2021 04:55 PM   Modules accepted: Orders

## 2021-01-21 ENCOUNTER — Other Ambulatory Visit: Payer: Self-pay | Admitting: *Deleted

## 2021-01-21 MED ORDER — FLUOXETINE HCL 20 MG PO CAPS: 20.0000 mg | ORAL_CAPSULE | Freq: Every day | ORAL | 2 refills | Status: DC

## 2021-02-19 ENCOUNTER — Other Ambulatory Visit: Payer: Self-pay | Admitting: Family Medicine

## 2021-03-03 DIAGNOSIS — Z1231 Encounter for screening mammogram for malignant neoplasm of breast: Secondary | ICD-10-CM | POA: Diagnosis not present

## 2021-03-10 ENCOUNTER — Encounter: Payer: Self-pay | Admitting: Family Medicine

## 2021-03-10 ENCOUNTER — Other Ambulatory Visit: Payer: Self-pay

## 2021-03-10 ENCOUNTER — Ambulatory Visit (INDEPENDENT_AMBULATORY_CARE_PROVIDER_SITE_OTHER): Payer: Medicare Other | Admitting: Family Medicine

## 2021-03-10 VITALS — BP 129/76 | HR 65 | Temp 97.9°F | Ht 62.0 in | Wt 153.2 lb

## 2021-03-10 DIAGNOSIS — E039 Hypothyroidism, unspecified: Secondary | ICD-10-CM | POA: Diagnosis not present

## 2021-03-10 DIAGNOSIS — E1169 Type 2 diabetes mellitus with other specified complication: Secondary | ICD-10-CM

## 2021-03-10 DIAGNOSIS — E785 Hyperlipidemia, unspecified: Secondary | ICD-10-CM | POA: Diagnosis not present

## 2021-03-10 DIAGNOSIS — E1159 Type 2 diabetes mellitus with other circulatory complications: Secondary | ICD-10-CM | POA: Diagnosis not present

## 2021-03-10 DIAGNOSIS — M25551 Pain in right hip: Secondary | ICD-10-CM

## 2021-03-10 DIAGNOSIS — I152 Hypertension secondary to endocrine disorders: Secondary | ICD-10-CM

## 2021-03-10 NOTE — Patient Instructions (Signed)

## 2021-03-10 NOTE — Progress Notes (Signed)
Subjective: CC: Follow-up hip pain, diabetes PCP: [name], DO YWV:[id number] Stacy [name] is Stacy 65 y.o. female presenting to clinic today for:  1. Type 2 Diabetes with hypertension, hyperlipidemia:  Patient reports compliance with her Ozempic 0.25 mg each week.  She is not had any issues with this medication.  Denies any hypoglycemic episodes.  No abdominal pain, nausea or vomiting.  She is compliant with losartan 25 mg daily and Repatha.  She does admit though that she is only using her Repatha once per month due to cost.  She has not seen her cardiologist in Stacy while but plans to schedule an appointment soon.  She had Stacy visit with the home health nurse through her Medicare plan.  She had A1c done at that point and it was 5.5.  She also had PAD screening and this showed normal flow.  There were no other focal findings that were of concern.  Blood pressure was normal during that visit.  Last eye exam: Up-to-date Last foot exam: Needs Last A1c:  Lab Results  Component Value Date   HGBA1C 6.2 08/11/2020   Nephropathy screen indicated?:  Up-to-date Last flu, zoster and/or pneumovax:  Immunization History  Administered Date(s) Administered  . H1N1 10/06/2008  . Influenza Inj Mdck Quad With Preservative 08/06/2018  . Influenza Split 08/25/2017, 08/06/2018, 07/23/2019  . Influenza Whole 08/01/2007, 08/04/2009  . Influenza,inj,Quad PF,6+ Mos 08/25/2017  . Influenza-Unspecified 08/06/2018, 08/24/2020  . PFIZER(Purple Top)SARS-COV-2 Vaccination 01/08/2020, 01/29/2020, 08/24/2020  . Pneumococcal Conjugate-13 06/08/2018  . Pneumococcal Polysaccharide-23 08/01/2007  . Td 08/01/2007  . Tdap 01/23/2018    ROS: No chest pain, shortness of breath (outside of normal, uses Anoro), edema.  2.  Hypothyroidism Patient is compliant with Synthroid 75 mcg daily.  No tremor, heart palpitations, change in bowel habit or energy.  3.  Right hip pain Patient has ongoing right hip pain that does seem to  wrap around the anterior lateral aspect of her hip.  No radiation to the groin.  Denies any right lower extremity instability or weakness.  She does have occasional instability of the left knee but she wonders if this is because she is altering her gait.  Does not report any sensory changes.  She had CT abdomen pelvis performed in March which showed no acute intra-abdominal findings.  Nothing significant from the musculoskeletal standpoint in the lower back did noted.  She did have fatty liver.   ROS: Per HPI  Allergies  Allergen Reactions  . Avelox [Moxifloxacin Hcl In Nacl] Hives  . Ciprofloxacin     REACTION: Rash  . Livalo [Pitavastatin]   . Metformin And Related     kidney  . Moxifloxacin     REACTION: RASH  . Bactrim [Sulfamethoxazole-Trimethoprim] Rash  . Statins Other (See Comments)    myalgias  . Sulfamethoxazole-Trimethoprim Rash    REACTION: Rash   Past Medical History:  Diagnosis Date  . Asthma   . COPD, mild ([place])    Spirometerey 3/08  . Diabetes mellitus without complication ([place])   . Fatty liver disease, nonalcoholic   . GERD (gastroesophageal reflux disease)   . Hyperlipidemia   . Hypertension   . Hypothyroid Since 2003  . IBS (irritable bowel syndrome)   . Perimenopausal     Current Outpatient Medications:  .  albuterol (VENTOLIN HFA) 108 (90 Base) MCG/ACT inhaler, Inhale 2 puffs into the lungs every 6 (six) hours as needed for wheezing or shortness of breath., Disp: 18 g, Rfl: 11 .  Evolocumab (REPATHA) 140 MG/ML SOSY, Inject 140 mg into the skin every 14 (fourteen) days. (NEEDS TO BE SEEN BEFORE NEXT REFILL), Disp: 2 mL, Rfl: 0 .  FLUoxetine (PROZAC) 20 MG capsule, Take 1 capsule (20 mg total) by mouth daily., Disp: 90 capsule, Rfl: 2 .  levothyroxine (SYNTHROID) 75 MCG tablet, Take 1 tablet (75 mcg total) by mouth daily., Disp: 90 tablet, Rfl: 2 .  loratadine (CLARITIN) 10 MG tablet, Take 10 mg by mouth daily., Disp: , Rfl:  .  losartan (COZAAR) 25 MG  tablet, Take 1 tablet (25 mg total) by mouth daily., Disp: 90 tablet, Rfl: 1 .  Omeprazole-Sodium Bicarbonate (ZEGERID OTC PO), Take by mouth., Disp: , Rfl:  .  Semaglutide,0.25 or 0.5MG /DOS, (OZEMPIC, 0.25 OR 0.5 MG/DOSE,) 2 MG/1.5ML SOPN, Inject 0.25 mg into the skin once Stacy week., Disp: , Rfl:  .  umeclidinium-vilanterol (ANORO ELLIPTA) 62.5-25 MCG/INH AEPB, INHALE 1 PUFF BY MOUTH EVERY DAY, Disp: 60 each, Rfl: 4 .  albuterol (PROVENTIL) (2.5 MG/3ML) 0.083% nebulizer solution, Take 3 mLs (2.5 mg total) by nebulization every 6 (six) hours as needed for wheezing or shortness of breath. (Patient not taking: No sig reported), Disp: 75 mL, Rfl: 0 Social History   Socioeconomic History  . Marital status: Married    Spouse name: Not on file  . Number of children: 2  . Years of education: Not on file  . Highest education level: Associate degree: academic program  Occupational History  . Occupation: Works in Science writer  . Occupation: Disabled  Tobacco Use  . Smoking status: Former Smoker    Packs/day: 0.50    Years: 30.00    Pack years: 15.00    Types: Cigarettes    Quit date: 10/25/2007    Years since quitting: 13.3  . Smokeless tobacco: Never Used  Vaping Use  . Vaping Use: Never used  Substance and Sexual Activity  . Alcohol use: Yes    Alcohol/week: 1.0 standard drink    Types: 1 Glasses of wine per week    Comment: Occasional-once Stacy year  . Drug use: No  . Sexual activity: Yes    Birth control/protection: Surgical  Other Topics Concern  . Not on file  Social History Narrative   Remarried.    Previous occupation: Customer service manager.       Moved from [facility] in 2007   2 children, has stepchildren and grandchildren   Does not exercise   Social Determinants of Health   Financial Resource Strain: Not on file  Food Insecurity: Not on file  Transportation Needs: Not on file  Physical Activity: Not on file  Stress: Not on file  Social Connections: Not on file  Intimate Partner Violence: Not on  file   Family History  Problem Relation Age of Onset  . Hypertension Father   . Hyperlipidemia Father   . Heart disease Father        Died at age 42 of MI  . Hypothyroidism Father   . Diabetes Maternal Grandmother   . Raynaud syndrome Sister   . Rheum arthritis Sister   . Thyroid disease Sister   . CAD Brother        has 4 stents  . Rheum arthritis Brother   . Pulmonary fibrosis Brother   . Heart disease Sister 83       h/o MI @age  86  . Thyroid disease Sister   . Heart disease Mother   . Hypothyroidism Mother   . Macular degeneration Mother   .  Breast cancer Neg Hx   . Ovarian cancer Neg Hx   . Prostate cancer Neg Hx   . Colon cancer Neg Hx   . Stomach cancer Neg Hx   . Esophageal cancer Neg Hx     Objective: Office vital signs reviewed. BP 129/76   Pulse 65   Temp 97.9 F (36.6 C)   Ht 5\' 2"  (1.575 m)   Wt 153 lb 3.2 oz (69.5 kg)   SpO2 98%   BMI 28.02 kg/m   Physical Examination:  General: Awake, alert, well nourished, No acute distress HEENT: Normal; sclera white.  No exophthalmos.  No goiter Cardio: regular rate and rhythm, S1S2 heard, no murmurs appreciated Pulm: Globally decreased breath sounds.  No wheezes, rhonchi or rales.  Normal work of breathing on room air. Extremities: warm, well perfused, No edema, cyanosis or clubbing; +2 pulses bilaterally MSK: Patient ambulates independently with normal tone and no visible gait abnormality  Lumbar spine: Normal curvature.  No midline tenderness to palpation.  No paraspinal muscle tenderness palpation.  No significant tenderness palpation of the SI or lumbosacral joints.  There are no palpable abnormalities. Neuro: see DM foot Diabetic Foot Exam - Simple   Simple Foot Form Diabetic Foot exam was performed with the following findings: Yes 03/10/2021  7:58 PM  Visual Inspection No deformities, no ulcerations, no other skin breakdown bilaterally: Yes Sensation Testing Intact to touch and monofilament testing  bilaterally: Yes Pulse Check Posterior Tibialis and Dorsalis pulse intact bilaterally: Yes Comments      Assessment/ Plan: 64 y.o. female   Controlled type 2 diabetes mellitus with other specified complication, without long-term current use of insulin ([place]) - Plan: CANCELED: Bayer DCA Hb A1c Waived  Hyperlipidemia associated with type 2 diabetes mellitus ([place])  Hypertension associated with diabetes ([place])  Acquired hypothyroidism - Plan: T4, Free, TSH  Right hip pain - Plan: Ambulatory referral to Physical Therapy  Her sugars under excellent control with A1c down to 5.5 on external testing.  Truthfully she could discontinue Ozempic if she wanted to.  I do think that it poses Stacy good cardiovascular benefit, particularly given combination with Repatha.  She is not having any hypoglycemic episodes.  She is an otherwise relatively good health.  We can continue to monitor but if cost becomes Stacy factor I do not see any harm in discontinuing and monitoring her blood sugar closely.  Asymptomatic from Stacy thyroid standpoint.  Check T4 and TSH  Going to refer her to physical therapy for her right hip pain.  I do wonder if this is coming from the lumbosacral versus sacroiliac joint.  Her back exam was nonfocal.  She is not having any radicular type symptoms.  No orders of the defined types were placed in this encounter.  No orders of the defined types were placed in this encounter.    [name], DO [place] [phone]

## 2021-03-11 LAB — TSH: TSH: 2.26 u[IU]/mL (ref 0.450–4.500)

## 2021-03-11 LAB — T4, FREE: Free T4: 1.47 ng/dL (ref 0.82–1.77)

## 2021-03-15 ENCOUNTER — Encounter: Payer: Self-pay | Admitting: Physical Therapy

## 2021-03-15 ENCOUNTER — Ambulatory Visit: Payer: Medicare Other | Attending: Family Medicine | Admitting: Physical Therapy

## 2021-03-15 ENCOUNTER — Other Ambulatory Visit: Payer: Self-pay

## 2021-03-15 DIAGNOSIS — M25551 Pain in right hip: Secondary | ICD-10-CM | POA: Diagnosis not present

## 2021-03-15 NOTE — Therapy (Signed)
[facility] [place], [place], [address] Phone: [phone]   Fax:  [phone]  Physical Therapy Evaluation  Patient Details  Name: Stacy [name] MRN: [id number] Date of Birth: Mar 29, 1956 Referring Provider (PT): Ronnie Doss DO.   Encounter Date: 03/15/2021   PT End of Session - 03/15/21 1513    Visit Number 1    Number of Visits 8    Date for PT Re-Evaluation 04/12/21    PT Start Time 0215    PT Stop Time 0257    PT Time Calculation (min) 42 min    Activity Tolerance Patient tolerated treatment well    Behavior During Therapy [facility] for tasks assessed/performed           Past Medical History:  Diagnosis Date  . Asthma   . COPD, mild ([place])    Spirometerey 3/08  . Diabetes mellitus without complication ([place])   . Fatty liver disease, nonalcoholic   . GERD (gastroesophageal reflux disease)   . Hyperlipidemia   . Hypertension   . Hypothyroid Since 2003  . IBS (irritable bowel syndrome)   . Perimenopausal     Past Surgical History:  Procedure Laterality Date  . ABLATION    . CARDIAC CATHETERIZATION N/A 06/12/2015   Procedure: Left Heart Cath and Coronary Angiography;  Surgeon: Burnell Blanks, MD;  Location: [facility];  Service: Cardiovascular;  Laterality: N/A;  . CESAREAN SECTION    . LTCS     x2  . SPIROMETRY  01/05/2007   FVC 68% predicted; FEV1 44% predicted; FEV1/FVC 63% predicted = moderate obstruction with low vital capacity possibly due to restriction  . TUBAL LIGATION      There were no vitals filed for this visit.    Subjective Assessment - 03/15/21 1520    Subjective COVID-19 screen performed prior to patient entering clinic.  The patient presents to the clinic today with c/o right hip pain that has been ongoing for approximately 3 months.  She correlates the onset of her symptoms when she was on an antibiotic.  Her pain commonly rises to a 7/10.  Walking on uneven ground and  standing in one place increases her pain.  Pain prevents her from sleeping on her right side.  She hasn't found anything really helps decrease her pain.   She has also developed some pain in her left knee which may be due to overcompenation over the left LE.    Pertinent History COPD, DM, HTN, hypothyroidism,    Patient Stated Goals Get out of pain.    Currently in Pain? Yes    Pain Score 7     Pain Location Hip    Pain Orientation Right    Pain Descriptors / Indicators Shooting    Pain Type Acute pain    Pain Onset More than a month ago    Pain Frequency Constant    Aggravating Factors  See above.    Pain Relieving Factors See above.              [facility] PT Assessment - 03/15/21 0001      Assessment   Medical Diagnosis Right hip pain.    Referring Provider (PT) Ronnie Doss DO.    Onset Date/Surgical Date --   ~3 months ago.     Precautions   Precautions --   OP.Marland KitchenMarland Kitchenper patient.     Restrictions   Weight Bearing Restrictions No      Balance Screen   Has  the patient fallen in the past 6 months No    Has the patient had a decrease in activity level because of a fear of falling?  No    Is the patient reluctant to leave their home because of a fear of falling?  No      Home Ecologist residence      Prior Function   Level of Independence Independent      Posture/Postural Control   Posture/Postural Control No significant limitations      Deep Tendon Reflexes   DTR Assessment Site --   RT LE is normal.     ROM / Strength   AROM / PROM / Strength AROM;Strength      AROM   Overall AROM Comments Right HIP ROM is WF/NL.      Strength   Overall Strength Comments Normal right hip strength.      Palpation   Palpation comment Tender to palption over right TFL and glut med.      Ambulation/Gait   Gait Comments Gait antalgia.                      Objective measurements completed on examination: See above findings.        [facility] Adult PT Treatment/Exercise - 03/15/21 0001      Modalities   Modalities Electrical Stimulation      Electrical Stimulation   Electrical Stimulation Location Right lateral hip.    Electrical Stimulation Action IFC at 80-150 Hz on 40% scan x 20 minutes.                       PT Long Term Goals - 03/15/21 1702      PT LONG TERM GOAL #1   Title Independent with a HEP.    Time 4    Period Weeks    Status New      PT LONG TERM GOAL #2   Title Sleep 6 hours undisturbed.    Time 4    Period Weeks    Status New      PT LONG TERM GOAL #3   Title Perform ADL's with pain not > 3/10.    Time 4    Period Weeks    Status New                  Plan - 03/15/21 1618    Clinical Impression Statement The patient presents to OPPT with c/o right hip pain that has been ongoing for about three months.  She is very palpably tender over her right TFL and gluteus medius.  Her sleep is disturbed due to pain.  Her strength and range of motion are essentially WNL.  She does present with gait antalgia.  Patient will benefit from skilled physical therapy intervention to address pain and deficits.    Personal Factors and Comorbidities Comorbidity 1;Other    Comorbidities COPD, DM, HTN, hypothyroidism.    Examination-Activity Limitations Sleep;Locomotion Level    Examination-Participation Restrictions Other    Stability/Clinical Decision Making Evolving/Moderate complexity    Clinical Decision Making Low    Rehab Potential Excellent    PT Frequency 2x / week    PT Duration 4 weeks    PT Treatment/Interventions ADLs/Self Care Home Management;Cryotherapy;Electrical Stimulation;Ultrasound;Iontophoresis 4mg /ml Dexamethasone;Therapeutic activities;Therapeutic exercise;Patient/family education;Passive range of motion;Dry needling    PT Next Visit Plan Combo e'stim/US, STW/M, gentle right lateral hip stretching, sdly hip abduction.  Consulted and Agree with Plan of Care  Patient           Patient will benefit from skilled therapeutic intervention in order to improve the following deficits and impairments:  Abnormal gait,Decreased activity tolerance,Increased muscle spasms,Pain  Visit Diagnosis: Pain in right hip - Plan: PT plan of care cert/re-cert     Problem List Patient Active Problem List   Diagnosis Date Noted  . Drug-induced myopathy 04/21/2020  . Hyperlipidemia associated with type 2 diabetes mellitus ([employer]) 03/24/2020  . Reactive depression 06/26/2018  . Psychophysiological insomnia 05/28/2018  . Snoring 05/28/2018  . Hypersomnia with sleep apnea 05/28/2018  . Centrilobular emphysema ([place]) 05/28/2018  . Obesity (BMI 30.0-34.9) 05/28/2018  . Shortness of breath 05/28/2018  . Controlled diabetes mellitus type 2 with complications ([place]) 01/12/2247  . Shoulder pain, right 12/28/2017  . Chronic bilateral back pain 11/10/2017  . Lung disease, bullous ([place]) 08/14/2017  . Aortic atherosclerosis ([place]) 08/14/2017  . Heart palpitations 08/01/2017  . Glucose intolerance 06/27/2017  . Stress incontinence in female 06/09/2017  . Overactive bladder 06/09/2017  . Precordial pain   . Dyspareunia 04/08/2014  . PERIMENOPAUSAL SYNDROME 05/13/2009  . HYPERCHOLESTEROLEMIA, PURE 12/24/2008  . HYPERTENSION, BENIGN 12/24/2008  . FATTY LIVER DISEASE 08/06/2008  . Allergic rhinitis 02/25/2008  . COPD with asthma ([place]) 11/02/2007  . GERD 11/02/2007  . Rosacea 08/01/2007  . Hypothyroidism 06/13/2007  . UTI'S, RECURRENT 06/13/2007    Obbie Lewallen, [place] MPT 03/15/2021, 5:05 PM  [facility] [address] [place], [place], [address] Phone: [phone]   Fax:  [phone]  Name: Stacy [name] MRN: [id number] Date of Birth: [birth year]

## 2021-03-23 ENCOUNTER — Other Ambulatory Visit: Payer: Self-pay

## 2021-03-23 ENCOUNTER — Ambulatory Visit: Payer: Medicare Other

## 2021-03-23 DIAGNOSIS — M25551 Pain in right hip: Secondary | ICD-10-CM | POA: Diagnosis not present

## 2021-03-23 NOTE — Therapy (Signed)
Stacy [name], [place], [address] Phone: [phone]   Fax:  [phone]  Physical Therapy Treatment  Patient Details  Name: Stacy [name] MRN: [id number] Date of Birth: 06-Mar-1956 Referring Provider (PT): Stacy Doss DO.   Encounter Date: 03/23/2021   PT End of Session - 03/23/21 1030    Visit Number 2    Number of Visits 8    Date for PT Re-Evaluation 04/12/21    PT Start Time 0945    PT Stop Time 1030    PT Time Calculation (min) 45 min    Activity Tolerance Patient tolerated treatment well    Behavior During Therapy [facility] for tasks assessed/performed           Past Medical History:  Diagnosis Date  . Asthma   . COPD, mild ([place])    Spirometerey 3/08  . Diabetes mellitus without complication ([place])   . Fatty liver disease, nonalcoholic   . GERD (gastroesophageal reflux disease)   . Hyperlipidemia   . Hypertension   . Hypothyroid Since 2003  . IBS (irritable bowel syndrome)   . Perimenopausal     Past Surgical History:  Procedure Laterality Date  . ABLATION    . CARDIAC CATHETERIZATION N/A 06/12/2015   Procedure: Left Heart Cath and Coronary Angiography;  Surgeon: Stacy Blanks, MD;  Location: [place];  Service: Cardiovascular;  Laterality: N/A;  . CESAREAN SECTION    . LTCS     x2  . SPIROMETRY  01/05/2007   FVC 68% predicted; FEV1 44% predicted; FEV1/FVC 63% predicted = moderate obstruction with low vital capacity possibly due to restriction  . TUBAL LIGATION      There were no vitals filed for this visit.   Subjective Assessment - 03/23/21 0938    Subjective COVID-19 screen performed prior to patient entering clinic. Patient states that she almost felt worse after the first visit but today she is feeling better. Patient states that she still has trouble sleeping as it took her 2 hors and 19 mins to fall asleep.    Pertinent History COPD, DM, HTN, hypothyroidism,    How  long can you sit comfortably? 30 mins the  R low back starts to hurt ( in a soft catch)    How long can you stand comfortably? In one spot- 15 minutes causes pain in low back.    Currently in Pain? Yes    Pain Score 4     Pain Location Hip    Pain Orientation Right    Pain Descriptors / Indicators Shooting    Pain Type Acute pain    Pain Onset More than a month ago    Pain Frequency Constant                             [facility] Adult PT Treatment/Exercise - 03/23/21 0945      Exercises   Exercises Knee/Hip      Knee/Hip Exercises: Stretches   Active Hamstring Stretch Both;3 reps;20 seconds    Active Hamstring Stretch Limitations seated - chess press to the wall    Piriformis Stretch 3 reps;20 seconds    Piriformis Stretch Limitations seated fig 4      Knee/Hip Exercises: Aerobic   Nustep 10 mins lvl 2      Knee/Hip Exercises: Standing   Hip Extension Stengthening    Other Standing Knee Exercises side steps with red tband  Knee/Hip Exercises: Sidelying   Hip ABduction 2 sets;15 reps      Manual Therapy   Manual Therapy Joint mobilization;Soft tissue mobilization    Joint Mobilization R hip lateran and inferior distraction    Soft tissue mobilization lumbopelvic STM                       PT Long Term Goals - 03/15/21 1702      PT LONG TERM GOAL #1   Title Independent with a HEP.    Time 4    Period Weeks    Status New      PT LONG TERM GOAL #2   Title Sleep 6 hours undisturbed.    Time 4    Period Weeks    Status New      PT LONG TERM GOAL #3   Title Perform ADL's with pain not > 3/10.    Time 4    Period Weeks    Status New                 Plan - 03/23/21 1222    Clinical Impression Statement COVID-19 screen performed prior to patient entering clinic. She demonstrates significant limitations at the R QL and subsequent pain at the R SI joint, pt received relierf with long axis inferior distraction of the R hip. She  was able to perform todays exercises with minimal difficulty but reports feeling the stretch and ache with each. Patient receieved education on progression of mobility and strength to reduce limitation. Plan to continue per plan of care.    Personal Factors and Comorbidities Comorbidity 1;Other    Comorbidities COPD, DM, HTN, hypothyroidism.    Examination-Activity Limitations Sleep;Locomotion Level    Examination-Participation Restrictions Other    Stability/Clinical Decision Making Evolving/Moderate complexity    Clinical Decision Making Low    Rehab Potential Excellent    PT Frequency 2x / week    PT Duration 4 weeks    PT Treatment/Interventions ADLs/Self Care Home Management;Cryotherapy;Electrical Stimulation;Ultrasound;Iontophoresis 4mg /ml Dexamethasone;Therapeutic activities;Therapeutic exercise;Patient/family education;Passive range of motion;Dry needling    PT Next Visit Plan Combo e'stim/US, STW/M, gentle right lateral hip stretching, sdly hip abduction.           Patient will benefit from skilled therapeutic intervention in order to improve the following deficits and impairments:  Abnormal gait,Decreased activity tolerance,Increased muscle spasms,Pain  Visit Diagnosis: Pain in right hip     Problem List Patient Active Problem List   Diagnosis Date Noted  . Drug-induced myopathy 04/21/2020  . Hyperlipidemia associated with type 2 diabetes mellitus ([place]) 03/24/2020  . Reactive depression 06/26/2018  . Psychophysiological insomnia 05/28/2018  . Snoring 05/28/2018  . Hypersomnia with sleep apnea 05/28/2018  . Centrilobular emphysema ([place]) 05/28/2018  . Obesity (BMI 30.0-34.9) 05/28/2018  . Shortness of breath 05/28/2018  . Controlled diabetes mellitus type 2 with complications ([place]) 03/54/6568  . Shoulder pain, right 12/28/2017  . Chronic bilateral back pain 11/10/2017  . Lung disease, bullous ([place]) 08/14/2017  . Aortic atherosclerosis ([place]) 08/14/2017  . Heart  palpitations 08/01/2017  . Glucose intolerance 06/27/2017  . Stress incontinence in female 06/09/2017  . Overactive bladder 06/09/2017  . Precordial pain   . Dyspareunia 04/08/2014  . PERIMENOPAUSAL SYNDROME 05/13/2009  . HYPERCHOLESTEROLEMIA, PURE 12/24/2008  . HYPERTENSION, BENIGN 12/24/2008  . FATTY LIVER DISEASE 08/06/2008  . Allergic rhinitis 02/25/2008  . COPD with asthma ([place]) 11/02/2007  . GERD 11/02/2007  . Rosacea 08/01/2007  . Hypothyroidism 06/13/2007  .  UTI'S, RECURRENT 06/13/2007    [place], DPT 03/23/2021, 12:26 PM  [facility] [address] [place], [place], [address] Phone: [phone]   Fax:  [phone]  Name: BEXLEIGH THERIAULT MRN: [id number] Date of Birth: 1956-06-04

## 2021-03-30 ENCOUNTER — Other Ambulatory Visit: Payer: Self-pay

## 2021-03-30 ENCOUNTER — Ambulatory Visit: Payer: Medicare Other | Attending: Family Medicine

## 2021-03-30 DIAGNOSIS — M25551 Pain in right hip: Secondary | ICD-10-CM | POA: Diagnosis not present

## 2021-03-30 NOTE — Therapy (Signed)
[facility] [place], [place], [address] Phone: [phone]   Fax:  [phone]  Physical Therapy Treatment  Patient Details  Name: Stacy [name] MRN: [id number] Date of Birth: [birth year] Referring Provider (PT): Ronnie Doss DO.   Encounter Date: 03/30/2021   PT End of Session - 03/30/21 1208    Visit Number 3    Number of Visits 8    Date for PT Re-Evaluation 04/12/21    PT Start Time 1030    PT Stop Time 1115    PT Time Calculation (min) 45 min    Activity Tolerance Patient tolerated treatment well    Behavior During Therapy [facility] for tasks assessed/performed           Past Medical History:  Diagnosis Date  . Asthma   . COPD, mild ([place])    Spirometerey 3/08  . Diabetes mellitus without complication ([place])   . Fatty liver disease, nonalcoholic   . GERD (gastroesophageal reflux disease)   . Hyperlipidemia   . Hypertension   . Hypothyroid Since 2003  . IBS (irritable bowel syndrome)   . Perimenopausal     Past Surgical History:  Procedure Laterality Date  . ABLATION    . CARDIAC CATHETERIZATION N/A 06/12/2015   Procedure: Left Heart Cath and Coronary Angiography;  Surgeon: Burnell Blanks, MD;  Location: [facility];  Service: Cardiovascular;  Laterality: N/A;  . CESAREAN SECTION    . LTCS     x2  . SPIROMETRY  01/05/2007   FVC 68% predicted; FEV1 44% predicted; FEV1/FVC 63% predicted = moderate obstruction with low vital capacity possibly due to restriction  . TUBAL LIGATION      There were no vitals filed for this visit.   Subjective Assessment - 03/30/21 1030    Subjective COVID-19 screen performed prior to patient entering clinic. She reports that the pain seems to move up to the middle of her back. She states that she has been doing her stretches though. She is not sleeping very well either.    Pertinent History COPD, DM, HTN, hypothyroidism,    How long can you sit comfortably? 30  mins the  R low back starts to hurt ( in a soft catch)    Patient Stated Goals Get out of pain.    Currently in Pain? Yes    Pain Score 4     Pain Location Back    Pain Orientation Right;Lower    Pain Descriptors / Indicators Aching;Shooting    Pain Onset More than a month ago                             [facility] Adult PT Treatment/Exercise - 03/30/21 1030      Posture/Postural Control   Posture Comments thoracic kyphosis      Exercises   Exercises Knee/Hip      Knee/Hip Exercises: Stretches   Active Hamstring Stretch Both;3 reps;20 seconds    Active Hamstring Stretch Limitations seated - chess press to the wall    Piriformis Stretch 3 reps;20 seconds    Piriformis Stretch Limitations seated fig 4      Knee/Hip Exercises: Aerobic   Nustep 10 mins lvl 2      Knee/Hip Exercises: Standing   Other Standing Knee Exercises posterior pelvic tilt at the wall      Knee/Hip Exercises: Supine   Other Supine Knee/Hip Exercises DKTC with feet on physioball  then rotation x 3 mins    Other Supine Knee/Hip Exercises SKTC - 3 x 15 sec      Knee/Hip Exercises: Prone   Other Prone Exercises childs pose 10 x 10 sec      Manual Therapy   Manual Therapy Joint mobilization;Soft tissue mobilization    Manual therapy comments pt reports less sharp/ disocmfort in low back and  R hip    Joint Mobilization R hip lateran and inferior distraction    Soft tissue mobilization lumbopelvic STM                       PT Long Term Goals - 03/15/21 1702      PT LONG TERM GOAL #1   Title Independent with a HEP.    Time 4    Period Weeks    Status New      PT LONG TERM GOAL #2   Title Sleep 6 hours undisturbed.    Time 4    Period Weeks    Status New      PT LONG TERM GOAL #3   Title Perform ADL's with pain not > 3/10.    Time 4    Period Weeks    Status New                 Plan - 03/30/21 1208    Clinical Impression Statement Patient states that she  has been doing well for the most part but has some continued discomfort radiating from her hip to the middle of her back. Patient state that she was able to perform the exercises with greater ease today .She reuqiures a bit of tactile cuing to better perform pelvic tilts at the wall but improved with repetiton. Addition of child's pose for self mobilization provided for HEP. Continue skilled PT as indicated by plan of care.    Personal Factors and Comorbidities Comorbidity 1;Other    Comorbidities COPD, DM, HTN, hypothyroidism.    Examination-Activity Limitations Sleep;Locomotion Level    Examination-Participation Restrictions Other    Stability/Clinical Decision Making Evolving/Moderate complexity    Clinical Decision Making Low    Rehab Potential Excellent    PT Frequency 2x / week    PT Duration 4 weeks    PT Treatment/Interventions ADLs/Self Care Home Management;Cryotherapy;Electrical Stimulation;Ultrasound;Iontophoresis 4mg /ml Dexamethasone;Therapeutic activities;Therapeutic exercise;Patient/family education;Passive range of motion;Dry needling    PT Next Visit Plan , gentle right lateral hip stretching, sdly hip abduction.    Consulted and Agree with Plan of Care Patient           Patient will benefit from skilled therapeutic intervention in order to improve the following deficits and impairments:  Abnormal gait,Decreased activity tolerance,Increased muscle spasms,Pain  Visit Diagnosis: Pain in right hip     Problem List Patient Active Problem List   Diagnosis Date Noted  . Drug-induced myopathy 04/21/2020  . Hyperlipidemia associated with type 2 diabetes mellitus ([place]) 03/24/2020  . Reactive depression 06/26/2018  . Psychophysiological insomnia 05/28/2018  . Snoring 05/28/2018  . Hypersomnia with sleep apnea 05/28/2018  . Centrilobular emphysema ([place]) 05/28/2018  . Obesity (BMI 30.0-34.9) 05/28/2018  . Shortness of breath 05/28/2018  . Controlled diabetes mellitus type 2  with complications ([place]) 62/95/2841  . Shoulder pain, right 12/28/2017  . Chronic bilateral back pain 11/10/2017  . Lung disease, bullous ([place]) 08/14/2017  . Aortic atherosclerosis ([place]) 08/14/2017  . Heart palpitations 08/01/2017  . Glucose intolerance 06/27/2017  . Stress incontinence in female 06/09/2017  .  Overactive bladder 06/09/2017  . Precordial pain   . Dyspareunia 04/08/2014  . PERIMENOPAUSAL SYNDROME 05/13/2009  . HYPERCHOLESTEROLEMIA, PURE 12/24/2008  . HYPERTENSION, BENIGN 12/24/2008  . FATTY LIVER DISEASE 08/06/2008  . Allergic rhinitis 02/25/2008  . COPD with asthma ([place]) 11/02/2007  . GERD 11/02/2007  . Rosacea 08/01/2007  . Hypothyroidism 06/13/2007  . UTI'S, RECURRENT 06/13/2007    [name] PT, DPT 03/30/2021, 12:12 PM  [facility] [place], [place], [address] Phone: [phone]   Fax:  [phone]  Name: MILAINA SHER MRN: [id number] Date of Birth: 02/03/1956

## 2021-04-06 ENCOUNTER — Ambulatory Visit: Payer: Medicare Other

## 2021-04-06 ENCOUNTER — Other Ambulatory Visit: Payer: Self-pay

## 2021-04-06 DIAGNOSIS — M25551 Pain in right hip: Secondary | ICD-10-CM | POA: Diagnosis not present

## 2021-04-06 NOTE — Therapy (Signed)
[facility] [place], [place], [address] Phone: [phone]   Fax:  [phone]  Physical Therapy Treatment  Patient Details  Name: Stacy [name] MRN: [id number] Date of Birth: 22-Apr-1956 Referring Provider (PT): Ronnie Doss DO.   Encounter Date: 04/06/2021   PT End of Session - 04/06/21 1114     Visit Number 4    Number of Visits 8    Date for PT Re-Evaluation 04/12/21    PT Start Time 1029    PT Stop Time 1114    PT Time Calculation (min) 45 min    Activity Tolerance Patient tolerated treatment well    Behavior During Therapy [facility] for tasks assessed/performed             Past Medical History:  Diagnosis Date   Asthma    COPD, mild ([place])    Spirometerey 3/08   Diabetes mellitus without complication ([place])    Fatty liver disease, nonalcoholic    GERD (gastroesophageal reflux disease)    Hyperlipidemia    Hypertension    Hypothyroid Since 2003   IBS (irritable bowel syndrome)    Perimenopausal     Past Surgical History:  Procedure Laterality Date   ABLATION     CARDIAC CATHETERIZATION N/A 06/12/2015   Procedure: Left Heart Cath and Coronary Angiography;  Surgeon: Burnell Blanks, MD;  Location: [facility];  Service: Cardiovascular;  Laterality: N/A;   CESAREAN SECTION     LTCS     x2   SPIROMETRY  01/05/2007   FVC 68% predicted; FEV1 44% predicted; FEV1/FVC 63% predicted = moderate obstruction with low vital capacity possibly due to restriction   TUBAL LIGATION      There were no vitals filed for this visit.   Subjective Assessment - 04/06/21 1029     Subjective COVID-19 screen performed prior to patient entering clinic. Patient states that she has been laying on the R side and not been bothered by it much. She denies pain in the hip and back but states that for other reasons, she does nnot sleep well.    Pertinent History COPD, DM, HTN, hypothyroidism,    How long can you sit  comfortably? Has had no limitations this week (1 hour)    How long can you stand comfortably? 30 - 45 mins tolerated    How long can you walk comfortably? No difficulty    Currently in Pain? No/denies                               [facility] Adult PT Treatment/Exercise - 04/06/21 1030       Therapeutic Activites    Therapeutic Activities Lifting;Other Therapeutic Activities    Lifting sit to stand with 8# weight in hands    Other Therapeutic Activities education and cues on proper glute strength      Exercises   Exercises Knee/Hip      Knee/Hip Exercises: Stretches   Active Hamstring Stretch Both    Active Hamstring Stretch Limitations seated - VCs to reach chest to the wall    Piriformis Stretch 3 reps;20 seconds    Piriformis Stretch Limitations seated fig 4 with rotation      Knee/Hip Exercises: Aerobic   Nustep 10 mins lvl 3      Knee/Hip Exercises: Standing   SLS with Vectors flutter kicks : abd/ext 3 x 10 each      Knee/Hip  Exercises: Supine   Bridges Strengthening;10 reps    Other Supine Knee/Hip Exercises SKTC - 3 x 15 sec      Knee/Hip Exercises: Sidelying   Hip ABduction 2 sets;15 reps    Hip ABduction Limitations with ankle PF and hip ER      Knee/Hip Exercises: Prone   Other Prone Exercises childs pose 5 x 10 sec      Manual Therapy   Manual Therapy Joint mobilization;Soft tissue mobilization    Joint Mobilization R hip inferior disttraction    Soft tissue mobilization lumbopelvic STM                         PT Long Term Goals - 03/15/21 1702       PT LONG TERM GOAL #1   Title Independent with a HEP.    Time 4    Period Weeks    Status New      PT LONG TERM GOAL #2   Title Sleep 6 hours undisturbed.    Time 4    Period Weeks    Status New      PT LONG TERM GOAL #3   Title Perform ADL's with pain not > 3/10.    Time 4    Period Weeks    Status New                   Plan - 04/06/21 1050      Clinical Impression Statement Patient participates well in PT this session. She was able to demonstrate progression of lateral hip strength with ease of performing sit to stands without much diffculty. She demonstrates compliance with her HEP and able to progress with mobility and isolated proximal hip strength. Skilled PT recommended to continue pewr plan of care    Personal Factors and Comorbidities Comorbidity 1;Other    Comorbidities COPD, DM, HTN, hypothyroidism.    Examination-Activity Limitations Sleep;Locomotion Level    Examination-Participation Restrictions Other    Stability/Clinical Decision Making Evolving/Moderate complexity    Clinical Decision Making Low    Rehab Potential Excellent    PT Frequency 2x / week    PT Duration 4 weeks    PT Treatment/Interventions ADLs/Self Care Home Management;Cryotherapy;Electrical Stimulation;Ultrasound;Iontophoresis 4mg /ml Dexamethasone;Therapeutic activities;Therapeutic exercise;Patient/family education;Passive range of motion;Dry needling    Consulted and Agree with Plan of Care Patient             Patient will benefit from skilled therapeutic intervention in order to improve the following deficits and impairments:  Abnormal gait, Decreased activity tolerance, Increased muscle spasms, Pain  Visit Diagnosis: Pain in right hip     Problem List Patient Active Problem List   Diagnosis Date Noted   Drug-induced myopathy 04/21/2020   Hyperlipidemia associated with type 2 diabetes mellitus ([place]) 03/24/2020   Reactive depression 06/26/2018   Psychophysiological insomnia 05/28/2018   Snoring 05/28/2018   Hypersomnia with sleep apnea 05/28/2018   Centrilobular emphysema ([place]) 05/28/2018   Obesity (BMI 30.0-34.9) 05/28/2018   Shortness of breath 05/28/2018   Controlled diabetes mellitus type 2 with complications ([place]) 94/76/5465   Shoulder pain, right 12/28/2017   Chronic bilateral back pain 11/10/2017   Lung disease, bullous ([place])  08/14/2017   Aortic atherosclerosis ([place]) 08/14/2017   Heart palpitations 08/01/2017   Glucose intolerance 06/27/2017   Stress incontinence in female 06/09/2017   Overactive bladder 06/09/2017   Precordial pain    Dyspareunia 04/08/2014   PERIMENOPAUSAL SYNDROME 05/13/2009   HYPERCHOLESTEROLEMIA,  PURE 12/24/2008   HYPERTENSION, BENIGN 12/24/2008   FATTY LIVER DISEASE 08/06/2008   Allergic rhinitis 02/25/2008   COPD with asthma ([place]) 11/02/2007   GERD 11/02/2007   Rosacea 08/01/2007   Hypothyroidism 06/13/2007   UTI'S, RECURRENT 06/13/2007    [name] 04/06/2021, 11:14 AM  [facility] [address] [place], [place], [address] Phone: [phone]   Fax:  [phone]  Name: Stacy [name] MRN: [id number] Date of Birth: 23-Dec-1955

## 2021-04-13 ENCOUNTER — Ambulatory Visit: Payer: Medicare Other

## 2021-04-13 ENCOUNTER — Ambulatory Visit (INDEPENDENT_AMBULATORY_CARE_PROVIDER_SITE_OTHER): Payer: Medicare Other | Admitting: Family

## 2021-04-13 ENCOUNTER — Encounter: Payer: Self-pay | Admitting: Family

## 2021-04-13 DIAGNOSIS — J441 Chronic obstructive pulmonary disease with (acute) exacerbation: Secondary | ICD-10-CM | POA: Diagnosis not present

## 2021-04-13 MED ORDER — AZITHROMYCIN 250 MG PO TABS: ORAL_TABLET | ORAL | 0 refills | Status: DC

## 2021-04-13 MED ORDER — PREDNISONE 10 MG (21) PO TBPK: ORAL_TABLET | ORAL | 0 refills | Status: DC

## 2021-04-13 NOTE — Progress Notes (Signed)
Virtual Visit  Note Due to COVID-19 pandemic this visit was conducted virtually. This visit type was conducted due to national recommendations for restrictions regarding the COVID-19 Pandemic (e.g. social distancing, sheltering in place) in an effort to limit this patient's exposure and mitigate transmission in our community. All issues noted in this document were discussed and addressed.  A physical exam was not performed with this format.  I connected with Stacy [name] on 04/13/21 at 1:44 pm  by telephone and verified that I am speaking with the correct person using two identifiers. Stacy [name] is currently located at home and no one is currently with her during visit. The provider, Evelina Dun, FNP is located in their office at time of visit.  I discussed the limitations, risks, security and privacy concerns of performing an evaluation and management service by telephone and the availability of in person appointments. I also discussed with the patient that there may be a patient responsible charge related to this service. The patient expressed understanding and agreed to proceed.   History and Present Illness:  Cough This is a new problem. The current episode started in the past 7 days. The problem has been gradually worsening. The problem occurs every few minutes. The cough is Productive of sputum. Associated symptoms include chills, nasal congestion, postnasal drip, shortness of breath and wheezing. Pertinent negatives include no ear congestion, ear pain, fever, headaches, myalgias or sore throat. She has tried rest and a beta-agonist inhaler for the symptoms. The treatment provided mild relief. Her past medical history is significant for COPD and emphysema.     Review of Systems  Constitutional:  Positive for chills. Negative for fever.  HENT:  Positive for postnasal drip. Negative for ear pain and sore throat.   Respiratory:  Positive for cough, shortness of breath and wheezing.    Musculoskeletal:  Negative for myalgias.  Neurological:  Negative for headaches.  All other systems reviewed and are negative.   Observations/Objective: No SOB or distress noted, intermittent cough.   Assessment and Plan: 1. COPD exacerbation ([place]) Pt will take home COVID test today to rule out. If positive will send in anti-viral  - Take meds as prescribed - Use a cool mist humidifier  -Use saline nose sprays frequently -Force fluids -For any cough or congestion  Use plain Mucinex- regular strength or max strength is fine -For fever or aces or pains- take tylenol or ibuprofen. -Throat lozenges if help -RTO if symptoms worsen or do not improve  - predniSONE (STERAPRED UNI-PAK 21 TAB) 10 MG (21) TBPK tablet; Use as directed  Dispense: 21 tablet; Refill: 0 - azithromycin (ZITHROMAX) 250 MG tablet; Take 500 mg once, then 250 mg for four days  Dispense: 6 tablet; Refill: 0    I discussed the assessment and treatment plan with the patient. The patient was provided an opportunity to ask questions and all were answered. The patient agreed with the plan and demonstrated an understanding of the instructions.   The patient was advised to call back or seek an in-person evaluation if the symptoms worsen or if the condition fails to improve as anticipated.  The above assessment and management plan was discussed with the patient. The patient verbalized understanding of and has agreed to the management plan. Patient is aware to call the clinic if symptoms persist or worsen. Patient is aware when to return to the clinic for a follow-up visit. Patient educated on when it is appropriate to go to the emergency  department.   Time call ended:  1:57 pm   I provided 13 minutes of  non face-to-face time during this encounter.    Evelina Dun, FNP

## 2021-04-20 ENCOUNTER — Ambulatory Visit: Payer: Medicare Other

## 2021-04-20 ENCOUNTER — Other Ambulatory Visit: Payer: Self-pay

## 2021-04-20 DIAGNOSIS — M25551 Pain in right hip: Secondary | ICD-10-CM

## 2021-04-20 NOTE — Therapy (Signed)
[name] [name], [place], [address] Phone: [phone]   Fax:  [phone]  Physical Therapy Treatment  Patient Details  Name: Stacy [name] MRN: [id number] Date of Birth: 24-Sep-1956 Referring Provider (PT): Stacy Doss DO.   Encounter Date: 04/20/2021   PT End of Session - 04/20/21 1115     Visit Number 5    Number of Visits 8    Date for PT Re-Evaluation 05/04/21    PT Start Time 1028    PT Stop Time 1114    PT Time Calculation (min) 46 min    Activity Tolerance Patient tolerated treatment well    Behavior During Therapy [facility] for tasks assessed/performed             Past Medical History:  Diagnosis Date   Asthma    COPD, mild ([place])    Spirometerey 3/08   Diabetes mellitus without complication ([place])    Fatty liver disease, nonalcoholic    GERD (gastroesophageal reflux disease)    Hyperlipidemia    Hypertension    Hypothyroid Since 2003   IBS (irritable bowel syndrome)    Perimenopausal     Past Surgical History:  Procedure Laterality Date   ABLATION     CARDIAC CATHETERIZATION N/A 06/12/2015   Procedure: Left Heart Cath and Coronary Angiography;  Surgeon: Burnell Blanks, MD;  Location: [place] [facility];  Service: Cardiovascular;  Laterality: N/A;   CESAREAN SECTION     LTCS     x2   SPIROMETRY  01/05/2007   FVC 68% predicted; FEV1 44% predicted; FEV1/FVC 63% predicted = moderate obstruction with low vital capacity possibly due to restriction   TUBAL LIGATION      There were no vitals filed for this visit.   Subjective Assessment - 04/20/21 1043     Subjective COVID-19 screen performed prior to patient entering clinic. Patient states that she had a flare up last week and started taking prednisone to relieve it. She states that her hip has not bothered her but her L knee is sore.    Pertinent History COPD, DM, HTN, hypothyroidism,    How long can you sit comfortably? Has had no  limitations last week (1 hour)    How long can you stand comfortably? 30 - 45 mins tolerated    How long can you walk comfortably? No difficulty                               [facility] Adult PT Treatment/Exercise - 04/20/21 1030       Exercises   Exercises Knee/Hip      Knee/Hip Exercises: Stretches   Active Hamstring Stretch Both    Active Hamstring Stretch Limitations performed indep without VCs    Quad Stretch 2 reps;20 seconds    Piriformis Stretch 3 reps;20 seconds    Piriformis Stretch Limitations perfromed indep without VCs      Knee/Hip Exercises: Aerobic   Stationary Bike lvl 4.0 ; 10 mins      Knee/Hip Exercises: Standing   Other Standing Knee Exercises sit to stand with 6# bal; standing on foam x 15    Other Standing Knee Exercises side stepping with green tband at ankles x 3 laps      Manual Therapy   Manual Therapy Joint mobilization;Soft tissue mobilization    Joint Mobilization inferior glide of the L knee  PT Long Term Goals - 03/15/21 1702       PT LONG TERM GOAL #1   Title Independent with a HEP.    Time 4    Period Weeks    Status New      PT LONG TERM GOAL #2   Title Sleep 6 hours undisturbed.    Time 4    Period Weeks    Status New      PT LONG TERM GOAL #3   Title Perform ADL's with pain not > 3/10.    Time 4    Period Weeks    Status New                   Plan - 04/20/21 1120     Clinical Impression Statement Pateint with excellent participation in therapy with emphasis on lateral and posterior hip strength. Patient progressing appropriately towards goals.    Personal Factors and Comorbidities Comorbidity 1;Other    Comorbidities COPD, DM, HTN, hypothyroidism.    Examination-Activity Limitations Sleep;Locomotion Level    Examination-Participation Restrictions Other    Stability/Clinical Decision Making Evolving/Moderate complexity    Clinical Decision Making Low     Rehab Potential Excellent    PT Frequency 2x / week    PT Duration 2 weeks    PT Treatment/Interventions ADLs/Self Care Home Management;Cryotherapy;Electrical Stimulation;Ultrasound;Iontophoresis 4mg /ml Dexamethasone;Therapeutic activities;Therapeutic exercise;Patient/family education;Passive range of motion;Dry needling    PT Next Visit Plan hip abduction and extensor strength    Consulted and Agree with Plan of Care Patient             Patient will benefit from skilled therapeutic intervention in order to improve the following deficits and impairments:  Abnormal gait, Decreased activity tolerance, Increased muscle spasms, Pain  Visit Diagnosis: Pain in right hip     Problem List Patient Active Problem List   Diagnosis Date Noted   Drug-induced myopathy 04/21/2020   Hyperlipidemia associated with type 2 diabetes mellitus ([place]) 03/24/2020   Reactive depression 06/26/2018   Psychophysiological insomnia 05/28/2018   Snoring 05/28/2018   Hypersomnia with sleep apnea 05/28/2018   Centrilobular emphysema ([place]) 05/28/2018   Obesity (BMI 30.0-34.9) 05/28/2018   Shortness of breath 05/28/2018   Controlled diabetes mellitus type 2 with complications ([place]) 88/91/6945   Shoulder pain, right 12/28/2017   Chronic bilateral back pain 11/10/2017   Lung disease, bullous ([place]) 08/14/2017   Aortic atherosclerosis ([place]) 08/14/2017   Heart palpitations 08/01/2017   Glucose intolerance 06/27/2017   Stress incontinence in female 06/09/2017   Overactive bladder 06/09/2017   Precordial pain    Dyspareunia 04/08/2014   PERIMENOPAUSAL SYNDROME 05/13/2009   HYPERCHOLESTEROLEMIA, PURE 12/24/2008   HYPERTENSION, BENIGN 12/24/2008   FATTY LIVER DISEASE 08/06/2008   Allergic rhinitis 02/25/2008   COPD with asthma ([place]) 11/02/2007   GERD 11/02/2007   Rosacea 08/01/2007   Hypothyroidism 06/13/2007   UTI'S, RECURRENT 06/13/2007    Stacy [name] 04/20/2021, [name] [address] [place], [place], [address] Phone: [phone]   Fax:  [phone]  Name: Stacy [name] MRN: [id number] Date of Birth: [birth year]

## 2021-05-08 ENCOUNTER — Other Ambulatory Visit: Payer: Self-pay | Admitting: Emergency Medicine

## 2021-05-19 ENCOUNTER — Other Ambulatory Visit: Payer: Self-pay | Admitting: Family Medicine

## 2021-05-19 DIAGNOSIS — I10 Essential (primary) hypertension: Secondary | ICD-10-CM

## 2021-06-07 DIAGNOSIS — K589 Irritable bowel syndrome without diarrhea: Secondary | ICD-10-CM | POA: Insufficient documentation

## 2021-06-07 DIAGNOSIS — N951 Menopausal and female climacteric states: Secondary | ICD-10-CM | POA: Insufficient documentation

## 2021-06-07 DIAGNOSIS — K760 Fatty (change of) liver, not elsewhere classified: Secondary | ICD-10-CM | POA: Insufficient documentation

## 2021-06-07 DIAGNOSIS — E119 Type 2 diabetes mellitus without complications: Secondary | ICD-10-CM | POA: Insufficient documentation

## 2021-06-07 DIAGNOSIS — E785 Hyperlipidemia, unspecified: Secondary | ICD-10-CM | POA: Insufficient documentation

## 2021-06-09 ENCOUNTER — Encounter: Payer: Self-pay | Admitting: Cardiology

## 2021-06-09 ENCOUNTER — Ambulatory Visit: Payer: Medicare Other | Admitting: Cardiology

## 2021-06-09 ENCOUNTER — Other Ambulatory Visit: Payer: Self-pay

## 2021-06-09 VITALS — BP 114/80 | HR 63 | Ht 62.0 in | Wt 153.1 lb

## 2021-06-09 DIAGNOSIS — E782 Mixed hyperlipidemia: Secondary | ICD-10-CM

## 2021-06-09 DIAGNOSIS — E7800 Pure hypercholesterolemia, unspecified: Secondary | ICD-10-CM

## 2021-06-09 DIAGNOSIS — I209 Angina pectoris, unspecified: Secondary | ICD-10-CM

## 2021-06-09 DIAGNOSIS — E785 Hyperlipidemia, unspecified: Secondary | ICD-10-CM

## 2021-06-09 DIAGNOSIS — E1169 Type 2 diabetes mellitus with other specified complication: Secondary | ICD-10-CM | POA: Diagnosis not present

## 2021-06-09 DIAGNOSIS — R0609 Other forms of dyspnea: Secondary | ICD-10-CM

## 2021-06-09 DIAGNOSIS — I10 Essential (primary) hypertension: Secondary | ICD-10-CM | POA: Diagnosis not present

## 2021-06-09 DIAGNOSIS — R0600 Dyspnea, unspecified: Secondary | ICD-10-CM | POA: Diagnosis not present

## 2021-06-09 DIAGNOSIS — I700 Atherosclerosis of aorta: Secondary | ICD-10-CM

## 2021-06-09 HISTORY — DX: Angina pectoris, unspecified: I20.9

## 2021-06-09 MED ORDER — METOPROLOL TARTRATE 50 MG PO TABS: ORAL_TABLET | ORAL | 0 refills | Status: DC

## 2021-06-09 NOTE — Progress Notes (Signed)
Cardiology Office Note:    Date:  06/09/2021   ID:  Stacy [name], DOB Jun 17, 1956, MRN [id number]  PCP:  [name], DO  Cardiologist:  Jenean Lindau, MD   Referring MD: [name], DO    ASSESSMENT:    1. HYPERTENSION, BENIGN   2. Mixed hyperlipidemia   3. DOE (dyspnea on exertion)   4. Angina pectoris ([place])    PLAN:    In order of problems listed above:  Primary prevention stressed with the patient.  Importance of compliance with diet medication stressed and she vocalized understanding. Angina pectoris: Patient's symptoms are concerning.  She has multiple risk factors for coronary artery disease.  In view of this I gave her multiple options invasive and noninvasive and she prefers to get the CT coronary angiography with FFR. Essential hypertension: Blood pressure stable and diet was emphasized.  Lifestyle modification urged. Mixed dyslipidemia: She is on PCSK9 medications and I reviewed her lipids and they are fine.  This is followed by primary care provider. She knows to go to the nearest emergency room for any concerning symptoms.Patient will be seen in follow-up appointment in 6 months or earlier if the patient has any concerns    Medication Adjustments/Labs and Tests Ordered: Current medicines are reviewed at length with the patient today.  Concerns regarding medicines are outlined above.  Orders Placed This Encounter  Procedures   CT CORONARY MORPH W/CTA COR W/SCORE W/CA W/CM &/OR WO/CM   Basic metabolic panel   EKG [id number]    Meds ordered this encounter  Medications   metoprolol tartrate (LOPRESSOR) 50 MG tablet    Sig: Take 50 mg Lopressor 2 hours prior to your CT scan for a heart rate greater than 55.    Dispense:  1 tablet    Refill:  0      History of Present Illness:    Stacy [name] is a 65 y.o. female who is being seen today for the evaluation of chest pain at the request of Ronnie Doss M, DO.  Patient is a pleasant  65 year old female.  She has past medical history of essential hypertension and mixed dyslipidemia.  She has had a normal coronary angiogram in 2016.  Recently she mentions to me that she is experiencing chest tightness going to the jaw.  It occurs sometimes without any exertion but many at times when she is under stress.  She does not exercise on a regular basis.  She has orthopedic issues.  She tells me that she is on disability because of COPD and mold related lung issues.  At the time of my evaluation, the patient is alert awake oriented and in no distress.  Past Medical History:  Diagnosis Date   Asthma    COPD, mild ([place])    Spirometerey 3/08   Diabetes mellitus without complication ([place])    Fatty liver disease, nonalcoholic    GERD (gastroesophageal reflux disease)    Hyperlipidemia    Hypertension    Hypothyroid Since 2003   IBS (irritable bowel syndrome)    Perimenopausal     Past Surgical History:  Procedure Laterality Date   ABLATION     CARDIAC CATHETERIZATION N/A 06/12/2015   Procedure: Left Heart Cath and Coronary Angiography;  Surgeon: Burnell Blanks, MD;  Location: [facility];  Service: Cardiovascular;  Laterality: N/A;   CESAREAN SECTION     LTCS     x2   SPIROMETRY  01/05/2007   FVC  68% predicted; FEV1 44% predicted; FEV1/FVC 63% predicted = moderate obstruction with low vital capacity possibly due to restriction   TUBAL LIGATION      Current Medications: Current Meds  Medication Sig   albuterol (PROVENTIL) (2.5 MG/3ML) 0.083% nebulizer solution Take 3 mLs (2.5 mg total) by nebulization every 6 (six) hours as needed for wheezing or shortness of breath.   albuterol (VENTOLIN HFA) 108 (90 Base) MCG/ACT inhaler Inhale 2 puffs into the lungs every 6 (six) hours as needed for wheezing or shortness of breath.   Evolocumab (REPATHA) 140 MG/ML SOSY Inject 140 mg into the skin every 14 (fourteen) days. (NEEDS TO BE SEEN BEFORE NEXT REFILL)   FLUoxetine  (PROZAC) 20 MG capsule Take 1 capsule (20 mg total) by mouth daily.   levothyroxine (SYNTHROID) 75 MCG tablet Take 1 tablet (75 mcg total) by mouth daily.   loratadine (CLARITIN) 10 MG tablet Take 10 mg by mouth daily.   losartan (COZAAR) 25 MG tablet TAKE 1 TABLET BY MOUTH  DAILY   metoprolol tartrate (LOPRESSOR) 50 MG tablet Take 50 mg Lopressor 2 hours prior to your CT scan for a heart rate greater than 55.   Omeprazole-Sodium Bicarbonate (ZEGERID OTC PO) Take 20 mg by mouth daily.   Semaglutide,0.25 or 0.'5MG'$ /DOS, (OZEMPIC, 0.25 OR 0.5 MG/DOSE,) 2 MG/1.5ML SOPN Inject 0.25 mg into the skin once a week.   umeclidinium-vilanterol (ANORO ELLIPTA) 62.5-25 MCG/INH AEPB USE 1 INHALATION BY MOUTH  DAILY     Allergies:   Avelox [moxifloxacin hcl in nacl], Ciprofloxacin, Livalo [pitavastatin], Metformin and related, Moxifloxacin, Bactrim [sulfamethoxazole-trimethoprim], Statins, and Sulfamethoxazole-trimethoprim   Social History   Socioeconomic History   Marital status: Married    Spouse name: Not on file   Number of children: 2   Years of education: Not on file   Highest education level: Associate degree: academic program  Occupational History   Occupation: Works in Science writer   Occupation: Disabled  Tobacco Use   Smoking status: Former    Packs/day: 0.50    Years: 30.00    Pack years: 15.00    Types: Cigarettes    Quit date: 10/25/2007    Years since quitting: 13.6   Smokeless tobacco: Never  Vaping Use   Vaping Use: Never used  Substance and Sexual Activity   Alcohol use: Yes    Alcohol/week: 1.0 standard drink    Types: 1 Glasses of wine per week    Comment: Occasional-once a year   Drug use: No   Sexual activity: Yes    Birth control/protection: Surgical  Other Topics Concern   Not on file  Social History Narrative   Remarried.    Previous occupation: Customer service manager.       Moved from [facility] in [place]   2 children, has stepchildren and grandchildren   Does not exercise   Social  Determinants of Health   Financial Resource Strain: Not on file  Food Insecurity: Not on file  Transportation Needs: Not on file  Physical Activity: Not on file  Stress: Not on file  Social Connections: Not on file     Family History: The patient's family history includes CAD in her brother; Diabetes in her maternal grandmother; Heart disease in her father and mother; Heart disease (age of onset: 78) in her sister; Hyperlipidemia in her father; Hypertension in her father; Hypothyroidism in her father and mother; Macular degeneration in her mother; Pulmonary fibrosis in her brother; Raynaud syndrome in her sister; Rheum arthritis in her brother and  sister; Thyroid disease in her sister and sister. There is no history of Breast cancer, Ovarian cancer, Prostate cancer, Colon cancer, Stomach cancer, or Esophageal cancer.  ROS:   Please see the history of present illness.    All other systems reviewed and are negative.  EKGs/Labs/Other Studies Reviewed:    The following studies were reviewed today: EKG reveals sinus rhythm and nonspecific ST-T changes   Recent Labs: 09/19/2020: B Natriuretic Peptide 17.7 12/01/2020: ALT 27; BUN 9; Creatinine, Ser 0.77; Hemoglobin 14.6; Platelets 335; Potassium 3.9; Sodium 141 03/10/2021: TSH 2.260  Recent Lipid Panel    Component Value Date/Time   CHOL 113 08/11/2020 0906   TRIG 127 08/11/2020 0906   HDL 43 08/11/2020 0906   CHOLHDL 2.6 08/11/2020 0906   CHOLHDL 3.3 Ratio 08/05/2009 2054   VLDL 21 08/05/2009 2054   LDLCALC 47 08/11/2020 0906    Physical Exam:    VS:  BP 114/80   Pulse 63   Ht '5\' 2"'$  (1.575 m)   Wt 153 lb 1.3 oz (69.4 kg)   SpO2 96%   BMI 28.00 kg/m     Wt Readings from Last 3 Encounters:  06/09/21 153 lb 1.3 oz (69.4 kg)  03/10/21 153 lb 3.2 oz (69.5 kg)  12/29/20 153 lb (69.4 kg)     GEN: Patient is in no acute distress HEENT: Normal NECK: No JVD; No carotid bruits LYMPHATICS: No lymphadenopathy CARDIAC: S1 S2  regular, 2/6 systolic murmur at the apex. RESPIRATORY:  Clear to auscultation without rales, wheezing or rhonchi  ABDOMEN: Soft, non-tender, non-distended MUSCULOSKELETAL:  No edema; No deformity  SKIN: Warm and dry NEUROLOGIC:  Alert and oriented x 3 PSYCHIATRIC:  Normal affect    Signed, Jenean Lindau, MD  06/09/2021 2:11 PM    [facility]

## 2021-06-09 NOTE — Patient Instructions (Signed)
Medication Instructions:  No medication changes. *If you need a refill on your cardiac medications before your next appointment, please call your pharmacy*   Lab Work: None ordered If you have labs (blood work) drawn today and your tests are completely normal, you will receive your results only by: [place] (if you have MyChart) OR A paper copy in the mail If you have any lab test that is abnormal or we need to change your treatment, we will call you to review the results.   Testing/Procedures: Your cardiac CT will be scheduled at:   [facility] [place], [place] [address] [phone]   If scheduled at [facility], please arrive at the [facility] main entrance of [facility] 30 minutes prior to test start time. Proceed to the [facility] Radiology Department (first floor) to check-in and test prep.  Please follow these instructions carefully (unless otherwise directed):   On the Night Before the Test: Be sure to Drink plenty of water. Do not consume any caffeinated/decaffeinated beverages or chocolate 12 hours prior to your test. Do not take any antihistamines 12 hours prior to your test.   On the Day of the Test: Drink plenty of water. Do not drink any water within one hour of the test. Do not eat any food 4 hours prior to the test. You may take your regular medications prior to the test.  Take metoprolol (Lopressor) two hours prior to test. FEMALES- please wear underwire-free bra if available    After the Test: Drink plenty of water. After receiving IV contrast, you may experience a mild flushed feeling. This is normal. On occasion, you may experience a mild rash up to 24 hours after the test. This is not dangerous. If this occurs, you can take Benadryl 25 mg and increase your fluid intake. If you experience trouble breathing, this can be serious. If it is severe call 911 IMMEDIATELY. If it is mild, please call our  office.    Once we have confirmed authorization from your insurance company, we will call you to set up a date and time for your test. Based on how quickly your insurance processes prior authorizations requests, please allow up to 4 weeks to be contacted for scheduling your Cardiac CT appointment. Be advised that routine Cardiac CT appointments could be scheduled as many as 8 weeks after your provider has ordered it.  For non-scheduling related questions, please contact the cardiac imaging nurse navigator should you have any questions/concerns: Marchia Bond, Cardiac Imaging Nurse Navigator Burley Saver, Interim Cardiac Imaging Nurse [place] [facility] Services Direct Office Dial: [phone]   For scheduling needs, including cancellations and rescheduling, please call Vivien Rota at [phone].     Follow-Up: At [facility], you and your health needs are our priority.  As part of our continuing mission to provide you with exceptional heart care, we have created designated Provider Care Teams.  These Care Teams include your primary Cardiologist (physician) and Advanced Practice Providers (APPs -  Physician Assistants and Nurse Practitioners) who all work together to provide you with the care you need, when you need it.  We recommend signing up for the patient portal called "MyChart".  Sign up information is provided on this After Visit Summary.  MyChart is used to connect with patients for Virtual Visits (Telemedicine).  Patients are able to view lab/test results, encounter notes, upcoming appointments, etc.  Non-urgent messages can be sent to your provider as  well.   To learn more about what you can do with MyChart, go to [email or web address].    Your next appointment:   6 month(s)  The format for your next appointment:   In Person  Provider:   Jyl Heinz, MD   Other Instructions Cardiac CT Angiogram A cardiac CT angiogram is a procedure to look at the heart  and the area around the heart. It may be done to help find the cause of chest pains or other symptoms of heart disease. During this procedure, a substance called contrast dye is injected into the blood vessels in the area to be checked. A large X-ray machine, called a CT scanner, then takes detailed pictures of the heart and the surrounding area. The procedure is also sometimes called a coronary CT angiogram, coronary artery scanning, or CTA. A cardiac CT angiogram allows the health care provider to see how well blood is flowing to and from the heart. The health care provider will be able to see if there are any problems, such as: Blockage or narrowing of the coronary arteries in the heart. Fluid around the heart. Signs of weakness or disease in the muscles, valves, and tissues of the heart. Tell a health care provider about: Any allergies you have. This is especially important if you have had a previous allergic reaction to contrast dye. All medicines you are taking, including vitamins, herbs, eye drops, creams, and over-the-counter medicines. Any blood disorders you have. Any surgeries you have had. Any medical conditions you have. Whether you are pregnant or may be pregnant. Any anxiety disorders, chronic pain, or other conditions you have that may increase your stress or prevent you from lying still. What are the risks? Generally, this is a safe procedure. However, problems may occur, including: Bleeding. Infection. Allergic reactions to medicines or dyes. Damage to other structures or organs. Kidney damage from the contrast dye that is used. Increased risk of cancer from radiation exposure. This risk is low. Talk with your health care provider about: The risks and benefits of testing. How you can receive the lowest dose of radiation. What happens before the procedure? Wear comfortable clothing and remove any jewelry, glasses, dentures, and hearing aids. Follow instructions from your  health care provider about eating and drinking. This may include: For 12 hours before the procedure -- avoid caffeine. This includes tea, coffee, soda, energy drinks, and diet pills. Drink plenty of water or other fluids that do not have caffeine in them. Being well hydrated can prevent complications. For 4-6 hours before the procedure -- stop eating and drinking. The contrast dye can cause nausea, but this is less likely if your stomach is empty. Ask your health care provider about changing or stopping your regular medicines. This is especially important if you are taking diabetes medicines, blood thinners, or medicines to treat problems with erections (erectile dysfunction). What happens during the procedure?  Hair on your chest may need to be removed so that small sticky patches called electrodes can be placed on your chest. These will transmit information that helps to monitor your heart during the procedure. An IV will be inserted into one of your veins. You might be given a medicine to control your heart rate during the procedure. This will help to ensure that good images are obtained. You will be asked to lie on an exam table. This table will slide in and out of the CT machine during the procedure. Contrast dye will be injected into the IV. You  might feel warm, or you may get a metallic taste in your mouth. You will be given a medicine called nitroglycerin. This will relax or dilate the arteries in your heart. The table that you are lying on will move into the CT machine tunnel for the scan. The person running the machine will give you instructions while the scans are being done. You may be asked to: Keep your arms above your head. Hold your breath. Stay very still, even if the table is moving. When the scanning is complete, you will be moved out of the machine. The IV will be removed. The procedure may vary among health care providers and hospitals. What can I expect after the  procedure? After your procedure, it is common to have: A metallic taste in your mouth from the contrast dye. A feeling of warmth. A headache from the nitroglycerin. Follow these instructions at home: Take over-the-counter and prescription medicines only as told by your health care provider. If you are told, drink enough fluid to keep your urine pale yellow. This will help to flush the contrast dye out of your body. Most people can return to their normal activities right after the procedure. Ask your health care provider what activities are safe for you. It is up to you to get the results of your procedure. Ask your health care provider, or the department that is doing the procedure, when your results will be ready. Keep all follow-up visits as told by your health care provider. This is important. Contact a health care provider if: You have any symptoms of allergy to the contrast dye. These include: Shortness of breath. Rash or hives. A racing heartbeat. Summary A cardiac CT angiogram is a procedure to look at the heart and the area around the heart. It may be done to help find the cause of chest pains or other symptoms of heart disease. During this procedure, a large X-ray machine, called a CT scanner, takes detailed pictures of the heart and the surrounding area after a contrast dye has been injected into blood vessels in the area. Ask your health care provider about changing or stopping your regular medicines before the procedure. This is especially important if you are taking diabetes medicines, blood thinners, or medicines to treat erectile dysfunction. If you are told, drink enough fluid to keep your urine pale yellow. This will help to flush the contrast dye out of your body. This information is not intended to replace advice given to you by your health care provider. Make sure you discuss any questions you have with your health care provider. Document Revised: 06/05/2019 Document  Reviewed: 06/05/2019 Elsevier Patient Education  [employer].

## 2021-06-10 LAB — BASIC METABOLIC PANEL
BUN/Creatinine Ratio: 14 (ref 12–28)
BUN: 12 mg/dL (ref 8–27)
CO2: 23 mmol/L (ref 20–29)
Calcium: 9.5 mg/dL (ref 8.7–10.3)
Chloride: 104 mmol/L (ref 96–106)
Creatinine, Ser: 0.84 mg/dL (ref 0.57–1.00)
Glucose: 83 mg/dL (ref 65–99)
Potassium: 4.4 mmol/L (ref 3.5–5.2)
Sodium: 142 mmol/L (ref 134–144)
eGFR: 78 mL/min/{1.73_m2} (ref >59)

## 2021-06-14 ENCOUNTER — Other Ambulatory Visit: Payer: Self-pay | Admitting: Nurse Practitioner

## 2021-06-14 ENCOUNTER — Ambulatory Visit (INDEPENDENT_AMBULATORY_CARE_PROVIDER_SITE_OTHER): Payer: Medicare Other | Admitting: Nurse Practitioner

## 2021-06-14 ENCOUNTER — Encounter: Payer: Self-pay | Admitting: Nurse Practitioner

## 2021-06-14 DIAGNOSIS — J0111 Acute recurrent frontal sinusitis: Secondary | ICD-10-CM | POA: Insufficient documentation

## 2021-06-14 DIAGNOSIS — E119 Type 2 diabetes mellitus without complications: Secondary | ICD-10-CM

## 2021-06-14 MED ORDER — AMOXICILLIN-POT CLAVULANATE 875-125 MG PO TABS: 1.0000 | ORAL_TABLET | Freq: Two times a day (BID) | ORAL | 0 refills | Status: DC

## 2021-06-14 NOTE — Progress Notes (Signed)
oi  Virtual Visit  Note Due to COVID-19 pandemic this visit was conducted virtually. This visit type was conducted due to national recommendations for restrictions regarding the COVID-19 Pandemic (e.g. social distancing, sheltering in place) in an effort to limit this patient's exposure and mitigate transmission in our community. All issues noted in this document were discussed and addressed.  A physical exam was not performed with this format.  I connected with [name] on 06/14/21 at 11 am by telephone and verified that I am speaking with the correct person using two identifiers. VEONICA TIERCE is currently located at home during visit. The provider, Ivy Lynn, NP is located in their office at time of visit.  I discussed the limitations, risks, security and privacy concerns of performing an evaluation and management service by telephone and the availability of in person appointments. I also discussed with the patient that there may be a patient responsible charge related to this service. The patient expressed understanding and agreed to proceed.   History and Present Illness:  Sinusitis This is a recurrent problem. Episode onset: 24-36 hours. The problem has been gradually worsening since onset. There has been no fever. She is experiencing no pain. Associated symptoms include congestion and headaches. Pertinent negatives include no chills or shortness of breath. Past treatments include nothing.     Review of Systems  Constitutional:  Negative for chills.  HENT:  Positive for congestion.   Respiratory:  Negative for shortness of breath.   Skin:  Negative for rash.  Neurological:  Positive for headaches.  All other systems reviewed and are negative.   Observations/Objective: Tele -visit patient not in distress  Assessment and Plan:  Worsening unresolved sinusitis, symptoms unresolved, covid-19 test completed, results pending. Augmentin 875-125 mg tablet by mouth daily.  increase hydration  Follow Up Instructions: Follow up with worsening or unresolved symptoms    I discussed the assessment and treatment plan with the patient. The patient was provided an opportunity to ask questions and all were answered. The patient agreed with the plan and demonstrated an understanding of the instructions.   The patient was advised to call back or seek an in-person evaluation if the symptoms worsen or if the condition fails to improve as anticipated.  The above assessment and management plan was discussed with the patient. The patient verbalized understanding of and has agreed to the management plan. Patient is aware to call the clinic if symptoms persist or worsen. Patient is aware when to return to the clinic for a follow-up visit. Patient educated on when it is appropriate to go to the emergency department.   Time call ended:  11:10 am   I provided 10 minutes of  non face-to-face time during this encounter.    Ivy Lynn, NP

## 2021-06-14 NOTE — Assessment & Plan Note (Signed)
Worsening unresolved sinusitis, symptoms unresolved, covid-19 test completed, results pending. Augmentin 875-125 mg tablet by mouth daily. increase hydration

## 2021-06-15 LAB — NOVEL CORONAVIRUS, NAA: SARS-CoV-2, NAA: NOT DETECTED

## 2021-06-15 LAB — SARS-COV-2, NAA 2 DAY TAT

## 2021-06-16 ENCOUNTER — Encounter (HOSPITAL_COMMUNITY): Payer: Self-pay | Admitting: Emergency Medicine

## 2021-06-16 ENCOUNTER — Telehealth (HOSPITAL_COMMUNITY): Payer: Self-pay | Admitting: Emergency Medicine

## 2021-06-16 NOTE — Telephone Encounter (Signed)
Reaching out to patient to offer assistance regarding upcoming cardiac imaging study; pt verbalizes understanding of appt date/time, parking situation and where to check in, pre-test NPO status and medications ordered, and verified current allergies; name and call back number provided for further questions should they arise Marchia Bond RN Navigator Cardiac Imaging [facility] [phone] office [phone] cell  Denies iv issues Denies claustro '50mg'$  metoprolol tart

## 2021-06-18 ENCOUNTER — Ambulatory Visit (HOSPITAL_COMMUNITY)
Admission: RE | Admit: 2021-06-18 | Discharge: 2021-06-18 | Disposition: A | Discharge status: Home / Self Care | Payer: Medicare Other | Source: Ambulatory Visit | Attending: Cardiology | Admitting: Cardiology

## 2021-06-18 ENCOUNTER — Other Ambulatory Visit: Payer: Self-pay

## 2021-06-18 DIAGNOSIS — I700 Atherosclerosis of aorta: Secondary | ICD-10-CM | POA: Insufficient documentation

## 2021-06-18 DIAGNOSIS — R0609 Other forms of dyspnea: Secondary | ICD-10-CM

## 2021-06-18 DIAGNOSIS — I2510 Atherosclerotic heart disease of native coronary artery without angina pectoris: Secondary | ICD-10-CM | POA: Insufficient documentation

## 2021-06-18 DIAGNOSIS — K760 Fatty (change of) liver, not elsewhere classified: Secondary | ICD-10-CM | POA: Insufficient documentation

## 2021-06-18 DIAGNOSIS — R0600 Dyspnea, unspecified: Secondary | ICD-10-CM | POA: Diagnosis not present

## 2021-06-18 DIAGNOSIS — J439 Emphysema, unspecified: Secondary | ICD-10-CM | POA: Insufficient documentation

## 2021-06-18 MED ORDER — NITROGLYCERIN 0.4 MG SL SUBL
SUBLINGUAL_TABLET | SUBLINGUAL | Status: AC
  Administered 2021-06-18: 0.8 mg via SUBLINGUAL
  Filled 2021-06-18: qty 2

## 2021-06-18 MED ORDER — NITROGLYCERIN 0.4 MG SL SUBL: 0.8000 mg | SUBLINGUAL_TABLET | Freq: Once | SUBLINGUAL | Status: AC

## 2021-06-18 MED ORDER — IOHEXOL 350 MG/ML SOLN
95.0000 mL | Freq: Once | INTRAVENOUS | Status: AC | PRN
  Administered 2021-06-18: 95 mL via INTRAVENOUS

## 2021-06-23 ENCOUNTER — Other Ambulatory Visit: Payer: Self-pay | Admitting: Family Medicine

## 2021-06-25 ENCOUNTER — Telehealth: Payer: Self-pay | Admitting: Cardiology

## 2021-06-25 NOTE — Addendum Note (Signed)
Addended by: Truddie Hidden on: 06/25/2021 11:27 AM   Modules accepted: Orders

## 2021-06-25 NOTE — Telephone Encounter (Signed)
Stacy [name] is calling requesting to speak with [name] about the results she gave her this morning. She states she is wanting to know why she is needing labs.

## 2021-06-25 NOTE — Telephone Encounter (Signed)
Spoke to the patient just now and she let me know that she did not understand why she needed labs done or why she would need to be started on statin therapy when she is already on Repatha. She has been taking Repatha regularly every 14 days. I advised that I would reach out to Dr. Geraldo Pitter to see if I could give her more information as to why this was his recommendation and we would give her a call back on Tuesday. She states that this was fine.

## 2021-06-29 DIAGNOSIS — E782 Mixed hyperlipidemia: Secondary | ICD-10-CM | POA: Diagnosis not present

## 2021-06-29 NOTE — Telephone Encounter (Signed)
Pt wants to understand why lipids are needed. She is on Repatha and statin intolerant. How do you advise

## 2021-06-30 LAB — LIPID PANEL
Chol/HDL Ratio: 4.5 ratio — ABNORMAL HIGH (ref 0.0–4.4)
Cholesterol, Total: 179 mg/dL (ref 100–199)
HDL: 40 mg/dL (ref >39)
LDL Chol Calc (NIH): 104 mg/dL — ABNORMAL HIGH (ref 0–99)
Triglycerides: 199 mg/dL — ABNORMAL HIGH (ref 0–149)
VLDL Cholesterol Cal: 35 mg/dL (ref 5–40)

## 2021-06-30 LAB — HEPATIC FUNCTION PANEL
ALT: 23 IU/L (ref 0–32)
AST: 19 IU/L (ref 0–40)
Albumin: 4.5 g/dL (ref 3.8–4.8)
Alkaline Phosphatase: 80 IU/L (ref 44–121)
Bilirubin Total: 0.4 mg/dL (ref 0.0–1.2)
Bilirubin, Direct: 0.12 mg/dL (ref 0.00–0.40)
Total Protein: 6.4 g/dL (ref 6.0–8.5)

## 2021-06-30 LAB — BASIC METABOLIC PANEL
BUN/Creatinine Ratio: 12 (ref 12–28)
BUN: 11 mg/dL (ref 8–27)
CO2: 19 mmol/L — ABNORMAL LOW (ref 20–29)
Calcium: 9.4 mg/dL (ref 8.7–10.3)
Chloride: 105 mmol/L (ref 96–106)
Creatinine, Ser: 0.9 mg/dL (ref 0.57–1.00)
Glucose: 134 mg/dL — ABNORMAL HIGH (ref 65–99)
Potassium: 4.7 mmol/L (ref 3.5–5.2)
Sodium: 139 mmol/L (ref 134–144)
eGFR: 72 mL/min/{1.73_m2} (ref >59)

## 2021-06-30 MED ORDER — EZETIMIBE 10 MG PO TABS: 10.0000 mg | ORAL_TABLET | Freq: Every day | ORAL | 3 refills | Status: DC

## 2021-06-30 MED ORDER — REPATHA 140 MG/ML ~~LOC~~ SOSY
140.0000 mg | PREFILLED_SYRINGE | SUBCUTANEOUS | 12 refills | Status: DC
Start: 2021-06-30 — End: 2022-07-11

## 2021-06-30 NOTE — Addendum Note (Signed)
Addended by: Truddie Hidden on: 06/30/2021 08:13 AM   Modules accepted: Orders

## 2021-07-14 DIAGNOSIS — K219 Gastro-esophageal reflux disease without esophagitis: Secondary | ICD-10-CM | POA: Diagnosis not present

## 2021-07-14 DIAGNOSIS — R1310 Dysphagia, unspecified: Secondary | ICD-10-CM | POA: Diagnosis not present

## 2021-07-14 DIAGNOSIS — K921 Melena: Secondary | ICD-10-CM | POA: Diagnosis not present

## 2021-07-29 DIAGNOSIS — K921 Melena: Secondary | ICD-10-CM | POA: Diagnosis not present

## 2021-08-03 DIAGNOSIS — E7800 Pure hypercholesterolemia, unspecified: Secondary | ICD-10-CM | POA: Diagnosis not present

## 2021-08-03 DIAGNOSIS — E785 Hyperlipidemia, unspecified: Secondary | ICD-10-CM | POA: Diagnosis not present

## 2021-08-03 DIAGNOSIS — I700 Atherosclerosis of aorta: Secondary | ICD-10-CM | POA: Diagnosis not present

## 2021-08-03 DIAGNOSIS — E782 Mixed hyperlipidemia: Secondary | ICD-10-CM | POA: Diagnosis not present

## 2021-08-03 DIAGNOSIS — E1169 Type 2 diabetes mellitus with other specified complication: Secondary | ICD-10-CM | POA: Diagnosis not present

## 2021-08-04 ENCOUNTER — Emergency Department (HOSPITAL_BASED_OUTPATIENT_CLINIC_OR_DEPARTMENT_OTHER): Payer: Medicare Other

## 2021-08-04 ENCOUNTER — Ambulatory Visit: Payer: Medicare Other | Admitting: Family Medicine

## 2021-08-04 ENCOUNTER — Encounter (HOSPITAL_BASED_OUTPATIENT_CLINIC_OR_DEPARTMENT_OTHER): Payer: Self-pay | Admitting: Urology

## 2021-08-04 ENCOUNTER — Other Ambulatory Visit: Payer: Self-pay

## 2021-08-04 ENCOUNTER — Emergency Department (HOSPITAL_BASED_OUTPATIENT_CLINIC_OR_DEPARTMENT_OTHER)
Admission: EM | Admit: 2021-08-04 | Discharge: 2021-08-04 | Disposition: A | Discharge status: Home / Self Care | Payer: Medicare Other | Attending: Emergency Medicine | Admitting: Emergency Medicine

## 2021-08-04 DIAGNOSIS — E039 Hypothyroidism, unspecified: Secondary | ICD-10-CM | POA: Diagnosis not present

## 2021-08-04 DIAGNOSIS — E1169 Type 2 diabetes mellitus with other specified complication: Secondary | ICD-10-CM | POA: Insufficient documentation

## 2021-08-04 DIAGNOSIS — Z87891 Personal history of nicotine dependence: Secondary | ICD-10-CM | POA: Insufficient documentation

## 2021-08-04 DIAGNOSIS — Z7951 Long term (current) use of inhaled steroids: Secondary | ICD-10-CM | POA: Diagnosis not present

## 2021-08-04 DIAGNOSIS — J449 Chronic obstructive pulmonary disease, unspecified: Secondary | ICD-10-CM | POA: Insufficient documentation

## 2021-08-04 DIAGNOSIS — Z79899 Other long term (current) drug therapy: Secondary | ICD-10-CM | POA: Insufficient documentation

## 2021-08-04 DIAGNOSIS — Z794 Long term (current) use of insulin: Secondary | ICD-10-CM | POA: Diagnosis not present

## 2021-08-04 DIAGNOSIS — I10 Essential (primary) hypertension: Secondary | ICD-10-CM | POA: Insufficient documentation

## 2021-08-04 DIAGNOSIS — R1013 Epigastric pain: Secondary | ICD-10-CM | POA: Diagnosis not present

## 2021-08-04 DIAGNOSIS — J45909 Unspecified asthma, uncomplicated: Secondary | ICD-10-CM | POA: Insufficient documentation

## 2021-08-04 DIAGNOSIS — M546 Pain in thoracic spine: Secondary | ICD-10-CM | POA: Diagnosis not present

## 2021-08-04 DIAGNOSIS — I2510 Atherosclerotic heart disease of native coronary artery without angina pectoris: Secondary | ICD-10-CM | POA: Insufficient documentation

## 2021-08-04 DIAGNOSIS — R079 Chest pain, unspecified: Secondary | ICD-10-CM | POA: Diagnosis not present

## 2021-08-04 HISTORY — DX: Atherosclerotic heart disease of native coronary artery without angina pectoris: I25.10

## 2021-08-04 LAB — CBC WITH DIFFERENTIAL/PLATELET
Abs Immature Granulocytes: 0.02 10*3/uL (ref 0.00–0.07)
Basophils Absolute: 0.1 10*3/uL (ref 0.0–0.1)
Basophils Relative: 1 %
Eosinophils Absolute: 0.3 10*3/uL (ref 0.0–0.5)
Eosinophils Relative: 3 %
HCT: 42.7 % (ref 36.0–46.0)
Hemoglobin: 14.6 g/dL (ref 12.0–15.0)
Immature Granulocytes: 0 %
Lymphocytes Relative: 28 %
Lymphs Abs: 2.2 10*3/uL (ref 0.7–4.0)
MCH: 32.4 pg (ref 26.0–34.0)
MCHC: 34.2 g/dL (ref 30.0–36.0)
MCV: 94.7 fL (ref 80.0–100.0)
Monocytes Absolute: 0.8 10*3/uL (ref 0.1–1.0)
Monocytes Relative: 10 %
Neutro Abs: 4.4 10*3/uL (ref 1.7–7.7)
Neutrophils Relative %: 58 %
Platelets: 310 10*3/uL (ref 150–400)
RBC: 4.51 MIL/uL (ref 3.87–5.11)
RDW: 12.1 % (ref 11.5–15.5)
WBC: 7.7 10*3/uL (ref 4.0–10.5)
nRBC: 0 % (ref 0.0–0.2)

## 2021-08-04 LAB — LIPID PANEL
Chol/HDL Ratio: 2.2 ratio (ref 0.0–4.4)
Cholesterol, Total: 102 mg/dL (ref 100–199)
HDL: 47 mg/dL (ref >39)
LDL Chol Calc (NIH): 31 mg/dL (ref 0–99)
Triglycerides: 137 mg/dL (ref 0–149)
VLDL Cholesterol Cal: 24 mg/dL (ref 5–40)

## 2021-08-04 LAB — COMPREHENSIVE METABOLIC PANEL
ALT: 33 U/L (ref 0–44)
AST: 28 U/L (ref 15–41)
Albumin: 4 g/dL (ref 3.5–5.0)
Alkaline Phosphatase: 66 U/L (ref 38–126)
Anion gap: 8 (ref 5–15)
BUN: 11 mg/dL (ref 8–23)
CO2: 25 mmol/L (ref 22–32)
Calcium: 9.2 mg/dL (ref 8.9–10.3)
Chloride: 106 mmol/L (ref 98–111)
Creatinine, Ser: 0.8 mg/dL (ref 0.44–1.00)
GFR, Estimated: >60 mL/min (ref >60)
Glucose, Bld: 96 mg/dL (ref 70–99)
Potassium: 4.6 mmol/L (ref 3.5–5.1)
Sodium: 139 mmol/L (ref 135–145)
Total Bilirubin: 0.5 mg/dL (ref 0.3–1.2)
Total Protein: 6.8 g/dL (ref 6.5–8.1)

## 2021-08-04 LAB — HEPATIC FUNCTION PANEL
ALT: 27 IU/L (ref 0–32)
AST: 21 IU/L (ref 0–40)
Albumin: 4.6 g/dL (ref 3.8–4.8)
Alkaline Phosphatase: 82 IU/L (ref 44–121)
Bilirubin Total: 0.4 mg/dL (ref 0.0–1.2)
Bilirubin, Direct: 0.17 mg/dL (ref 0.00–0.40)
Total Protein: 6.5 g/dL (ref 6.0–8.5)

## 2021-08-04 LAB — D-DIMER, QUANTITATIVE: D-Dimer, Quant: <0.27 ug/mL-FEU (ref 0.00–0.50)

## 2021-08-04 LAB — LIPASE, BLOOD: Lipase: 37 U/L (ref 11–51)

## 2021-08-04 LAB — TROPONIN I (HIGH SENSITIVITY): Troponin I (High Sensitivity): <2 ng/L (ref <18)

## 2021-08-04 MED ORDER — MORPHINE SULFATE (PF) 4 MG/ML IV SOLN
4.0000 mg | Freq: Once | INTRAVENOUS | Status: AC
  Administered 2021-08-04: 4 mg via INTRAVENOUS
  Filled 2021-08-04: qty 1

## 2021-08-04 MED ORDER — METHOCARBAMOL 500 MG PO TABS: 500.0000 mg | ORAL_TABLET | Freq: Two times a day (BID) | ORAL | 0 refills | Status: DC

## 2021-08-04 MED ORDER — ONDANSETRON HCL 4 MG/2ML IJ SOLN
4.0000 mg | Freq: Once | INTRAMUSCULAR | Status: AC
  Administered 2021-08-04: 4 mg via INTRAVENOUS
  Filled 2021-08-04: qty 2

## 2021-08-04 NOTE — Addendum Note (Signed)
Addended by: Truddie Hidden on: 08/04/2021 05:00 PM   Modules accepted: Orders

## 2021-08-04 NOTE — ED Provider Notes (Signed)
[facility] EMERGENCY DEPARTMENT Provider Note   CSN: [id number] Arrival date & time: 08/04/21  1011     History Chief Complaint  Patient presents with   Back Pain    Stacy [name] is a 65 y.o. female.  HPI Patient is a 65 year old female with past medical history significant for COPD, CAD, DM2, fatty liver disease, reflux, HLD, HTN  Patient is presented to the emergency room today with right upper back pain that began yesterday seems to have come on relatively quickly seems to be right-sided upper back pain/thoracic back pain.  States that it is worse with breathing.  She states that she has no new cough.  Denies any hemoptysis.  She states that when she takes a deep breath the sharp pain seems to radiate through her right back to her chest.  Denies any nausea or vomiting.  States that her CAD is "mild "per her cardiologist. Denies any diaphoresis.  No recent surgeries, hospitalization, long travel, hemoptysis, estrogen containing OCP, cancer history.  No unilateral leg swelling.  No history of PE or VTE.   Denies any leg swelling or leg pain.  She states apart from the radiating pain from her back she does not have any thoracic pain / chest discomfort or chest pressure.  No other associated symptoms.  No aggravating mitigating factors.     Past Medical History:  Diagnosis Date   Asthma    COPD, mild ([place])    Spirometerey 3/08   Coronary artery disease    Diabetes mellitus without complication ([place])    Fatty liver disease, nonalcoholic    GERD (gastroesophageal reflux disease)    Hyperlipidemia    Hypertension    Hypothyroid Since 2003   IBS (irritable bowel syndrome)    Perimenopausal     Patient Active Problem List   Diagnosis Date Noted   Acute recurrent frontal sinusitis 06/14/2021   Angina pectoris ([place]) 06/09/2021   Diabetes mellitus without complication ([place]) 88/41/6606   Fatty liver disease, nonalcoholic 30/16/0109   Hyperlipidemia 06/07/2021    IBS (irritable bowel syndrome) 06/07/2021   Perimenopausal 06/07/2021   Drug-induced myopathy 04/21/2020   Hyperlipidemia associated with type 2 diabetes mellitus ([place]) 03/24/2020   Reactive depression 06/26/2018   Psychophysiological insomnia 05/28/2018   Snoring 05/28/2018   Hypersomnia with sleep apnea 05/28/2018   Centrilobular emphysema ([place]) 05/28/2018   Obesity (BMI 30.0-34.9) 05/28/2018   Shortness of breath 05/28/2018   Controlled diabetes mellitus type 2 with complications ([place]) 32/35/5732   Shoulder pain, right 12/28/2017   Chronic bilateral back pain 11/10/2017   Lung disease, bullous ([place]) 08/14/2017   Aortic atherosclerosis ([place]) 08/14/2017   Heart palpitations 08/01/2017   Glucose intolerance 06/27/2017   Stress incontinence in female 06/09/2017   Overactive bladder 06/09/2017   Precordial pain    Dyspareunia 04/08/2014   PERIMENOPAUSAL SYNDROME 05/13/2009   HYPERCHOLESTEROLEMIA, PURE 12/24/2008   HYPERTENSION, BENIGN 12/24/2008   FATTY LIVER DISEASE 08/06/2008   Allergic rhinitis 02/25/2008   COPD with asthma ([place]) 11/02/2007   GERD 11/02/2007   Rosacea 08/01/2007   Hypothyroidism 06/13/2007   UTI'S, RECURRENT 06/13/2007    Past Surgical History:  Procedure Laterality Date   ABLATION     CARDIAC CATHETERIZATION N/A 06/12/2015   Procedure: Left Heart Cath and Coronary Angiography;  Surgeon: Burnell Blanks, MD;  Location: [facility];  Service: Cardiovascular;  Laterality: N/A;   CESAREAN SECTION     LTCS     x2   SPIROMETRY  01/05/2007   FVC 68% predicted; FEV1 44% predicted; FEV1/FVC 63% predicted = moderate obstruction with low vital capacity possibly due to restriction   TUBAL LIGATION       OB History     Gravida  3   Para  2   Term  2   Preterm      AB  1   Living  2      SAB  1   IAB      Ectopic      Multiple      Live Births              Family History  Problem Relation Age of Onset   Hypertension  Father    Hyperlipidemia Father    Heart disease Father        Died at age 39 of MI   Hypothyroidism Father    Diabetes Maternal Grandmother    Raynaud syndrome Sister    Rheum arthritis Sister    Thyroid disease Sister    CAD Brother        has 4 stents   Rheum arthritis Brother    Pulmonary fibrosis Brother    Heart disease Sister 51       h/o MI @age  89   Thyroid disease Sister    Heart disease Mother    Hypothyroidism Mother    Macular degeneration Mother    Breast cancer Neg Hx    Ovarian cancer Neg Hx    Prostate cancer Neg Hx    Colon cancer Neg Hx    Stomach cancer Neg Hx    Esophageal cancer Neg Hx     Social History   Tobacco Use   Smoking status: Former    Packs/day: 0.50    Years: 30.00    Pack years: 15.00    Types: Cigarettes    Quit date: 10/25/2007    Years since quitting: 13.7   Smokeless tobacco: Never  Vaping Use   Vaping Use: Never used  Substance Use Topics   Alcohol use: Yes    Alcohol/week: 1.0 standard drink    Types: 1 Glasses of wine per week    Comment: Occasional-once a year   Drug use: No    Home Medications Prior to Admission medications   Medication Sig Start Date End Date Taking? Authorizing Provider  methocarbamol (ROBAXIN) 500 MG tablet Take 1 tablet (500 mg total) by mouth 2 (two) times daily. 08/04/21  Yes Emeril Stille S, PA  albuterol (PROVENTIL) (2.5 MG/3ML) 0.083% nebulizer solution Take 3 mLs (2.5 mg total) by nebulization every 6 (six) hours as needed for wheezing or shortness of breath. 09/19/20   Isla Pence, MD  albuterol (VENTOLIN HFA) 108 (90 Base) MCG/ACT inhaler Inhale 2 puffs into the lungs every 6 (six) hours as needed for wheezing or shortness of breath. 04/29/20   [name], DO  amoxicillin-clavulanate (AUGMENTIN) 875-125 MG tablet Take 1 tablet by mouth 2 (two) times daily. 06/14/21   Ivy Lynn, NP  Evolocumab (REPATHA) 140 MG/ML SOSY Inject 140 mg into the skin every 14 (fourteen) days.  06/30/21   Revankar, Reita Cliche, MD  ezetimibe (ZETIA) 10 MG tablet Take 1 tablet (10 mg total) by mouth daily. 06/30/21 09/28/21  Revankar, Reita Cliche, MD  FLUoxetine (PROZAC) 20 MG capsule Take 1 capsule (20 mg total) by mouth daily. 01/21/21   [name], DO  levothyroxine (SYNTHROID) 75 MCG tablet Take 1 tablet (75 mcg total) by  mouth daily. 01/19/21   [name], DO  loratadine (CLARITIN) 10 MG tablet Take 10 mg by mouth daily.    [provider]  losartan (COZAAR) 25 MG tablet TAKE 1 TABLET BY MOUTH  DAILY 05/19/21   Ronnie Doss M, DO  metoprolol tartrate (LOPRESSOR) 50 MG tablet Take 50 mg Lopressor 2 hours prior to your CT scan for a heart rate greater than 55. 06/09/21   Revankar, Reita Cliche, MD  Omeprazole-Sodium Bicarbonate (ZEGERID OTC PO) Take 20 mg by mouth daily.    [provider]  Semaglutide,0.25 or 0.5MG /DOS, (OZEMPIC, 0.25 OR 0.5 MG/DOSE,) 2 MG/1.5ML SOPN Inject 0.25 mg into the skin once a week.    [provider]  umeclidinium-vilanterol (ANORO ELLIPTA) 62.5-25 MCG/INH AEPB USE 1 INHALATION BY MOUTH  DAILY 05/11/21   Collene Gobble, MD    Allergies    Avelox [moxifloxacin hcl in nacl], Ciprofloxacin, Livalo [pitavastatin], Metformin and related, Moxifloxacin, Bactrim [sulfamethoxazole-trimethoprim], Statins, and Sulfamethoxazole-trimethoprim  Review of Systems   Review of Systems  Constitutional:  Negative for chills and fever.  HENT:  Negative for congestion.   Eyes:  Negative for pain.  Respiratory:  Positive for shortness of breath. Negative for cough.   Cardiovascular:  Negative for chest pain and leg swelling.  Gastrointestinal:  Negative for abdominal pain and vomiting.  Genitourinary:  Negative for dysuria.  Musculoskeletal:  Positive for back pain. Negative for myalgias.  Skin:  Negative for rash.  Neurological:  Negative for dizziness and headaches.  Psychiatric/Behavioral:  Negative for agitation.    Physical Exam Updated  Vital Signs BP 113/75   Pulse (!) 57   Temp (!) 97.5 F (36.4 C) (Oral)   Resp 16   Ht 5\' 3"  (1.6 m)   Wt 68.5 kg   SpO2 95%   BMI 26.75 kg/m   Physical Exam Vitals and nursing note reviewed.  Constitutional:      General: She is not in acute distress.    Comments: Pleasant 65 year old female uncomfortable but in no acute distress speaking full sentences not tachypneic  HENT:     Head: Normocephalic and atraumatic.     Nose: Nose normal.  Eyes:     General: No scleral icterus. Cardiovascular:     Rate and Rhythm: Normal rate and regular rhythm.     Pulses: Normal pulses.     Heart sounds: Normal heart sounds.  Pulmonary:     Effort: Pulmonary effort is normal. No respiratory distress.     Breath sounds: No wheezing.     Comments: Lungs clear to auscultation all fields.  No chest or posterior thoracic tenderness palpation. Chest:     Chest wall: No tenderness.  Abdominal:     Palpations: Abdomen is soft.     Tenderness: There is no abdominal tenderness.  Musculoskeletal:     Cervical back: Normal range of motion.     Right lower leg: No edema.     Left lower leg: No edema.     Comments: No C, T, L-spine tenderness palpation, no tenderness palpation of the scapula or posterior thorax  Skin:    General: Skin is warm and dry.     Capillary Refill: Capillary refill takes less than 2 seconds.  Neurological:     Mental Status: She is alert. Mental status is at baseline.  Psychiatric:        Mood and Affect: Mood normal.        Behavior: Behavior normal.  ED Results / Procedures / Treatments   Labs (all labs ordered are listed, but only abnormal results are displayed) Labs Reviewed  CBC WITH DIFFERENTIAL/PLATELET  COMPREHENSIVE METABOLIC PANEL  D-DIMER, QUANTITATIVE  LIPASE, BLOOD  TROPONIN I (HIGH SENSITIVITY)    EKG EKG Interpretation  Date/Time:  Wednesday August 04 2021 13:28:16 EDT Ventricular Rate:  59 PR Interval:  146 QRS Duration: 97 QT  Interval:  457 QTC Calculation: 453 R Axis:   65 Text Interpretation: Sinus rhythm Low voltage, precordial leads No significant change since last tracing Confirmed by Deno Etienne [phone]) on 08/04/2021 1:32:04 PM  Radiology DG Chest 2 View  Result Date: 08/04/2021 CLINICAL DATA:  Chest pain. EXAM: CHEST - 2 VIEW COMPARISON:  August 30, 2020. FINDINGS: The heart size and mediastinal contours are within normal limits. Both lungs are clear. The visualized skeletal structures are unremarkable. IMPRESSION: No active cardiopulmonary disease. Electronically Signed   By: Marijo Conception M.D.   On: 08/04/2021 12:54    Procedures Procedures   Medications Ordered in ED Medications  morphine 4 MG/ML injection 4 mg (4 mg Intravenous Given 08/04/21 1206)  ondansetron (ZOFRAN) injection 4 mg (4 mg Intravenous Given 08/04/21 1205)    ED Course  I have reviewed the triage vital signs and the nursing notes.  Pertinent labs & imaging results that were available during my care of the patient were reviewed by me and considered in my medical decision making (see chart for details).    MDM Rules/Calculators/A&P                          Patient is a 65 year old female with past medical history detailed in HPI presented today for right sided midthoracic back pain.  It is not reproducible on my examination.  Seems to be occasionally sometimes worse with movement but seems to be having some pleuritic symptoms.  SPO2 between 95 and 100% she was ambulated and did not desaturate.  No abdominal tenderness on my examination. Does endorse some mild epigastric discomfort.  Seems to be unrelated.  Physical exam reassuring.  Symmetric bilateral radial artery pulses.  Back pain seems to radiate to the front of her thorax.  EKG nonischemic.  No ST-T wave abnormalities of note.  Troponin x1 within normal limits her symptoms been ongoing for more than 6 hours now.  I have been constant.  CMP CBC unremarkable.  D-dimer  undetectably low.  Lipase within norm limits.  Chest x-ray without abnormality.  Briefly discussed my attending physician Dr. Tyrone Nine.  EKG reviewed  Patient states her symptoms are much improved after morphine given small dose of Zofran because of nausea with morphine.  Patient reassessed.  Is ambulating well.  States that she is breathing better now that the pain is improved.  Will recommend ibuprofen and Tylenol at home prescribe muscle relaxer.  She was given very strict return precautions to the emergency room will follow-up with PCP.  Final Clinical Impression(s) / ED Diagnoses Final diagnoses:  Acute thoracic back pain, unspecified back pain laterality    Rx / DC Orders ED Discharge Orders          Ordered    methocarbamol (ROBAXIN) 500 MG tablet  2 times daily        08/04/21 1345             Tedd Sias, [place] 08/04/21 [place], [place], DO 08/04/21 1451

## 2021-08-04 NOTE — Discharge Instructions (Addendum)
Your work-up here in the ER was quite reassuring. Given your normal work-up here normal chest x-ray and normal lab work and normal EKG I think it is very reasonable to send you home and start treating for musculoskeletal pain versus pleurisy versus costochondritis.  600 mg of ibuprofen every 6 hours for the next couple days should help with your pain.  Please follow-up with your primary care provider  Return immediately to the emergency room should he have any new or concerning symptoms as we discussed  I have also prescribed a muscle relaxer.  This can cause some drowsiness and dizziness and lightheadedness.  Please monitor your symptoms.  You may stop taking this if it causes adverse side effects that you do not wish to experience.

## 2021-08-04 NOTE — ED Triage Notes (Signed)
Right upper back pain since yesterday, states fatigue since yesterday.  Worse with deep breathing.

## 2021-08-06 ENCOUNTER — Other Ambulatory Visit: Payer: Self-pay

## 2021-08-06 ENCOUNTER — Ambulatory Visit (INDEPENDENT_AMBULATORY_CARE_PROVIDER_SITE_OTHER): Payer: Medicare Other | Admitting: Family Medicine

## 2021-08-06 ENCOUNTER — Encounter: Payer: Self-pay | Admitting: Family Medicine

## 2021-08-06 VITALS — BP 119/69 | HR 61 | Temp 98.4°F | Ht 63.0 in | Wt 154.0 lb

## 2021-08-06 DIAGNOSIS — E785 Hyperlipidemia, unspecified: Secondary | ICD-10-CM

## 2021-08-06 DIAGNOSIS — I152 Hypertension secondary to endocrine disorders: Secondary | ICD-10-CM

## 2021-08-06 DIAGNOSIS — E1159 Type 2 diabetes mellitus with other circulatory complications: Secondary | ICD-10-CM

## 2021-08-06 DIAGNOSIS — E039 Hypothyroidism, unspecified: Secondary | ICD-10-CM

## 2021-08-06 DIAGNOSIS — E1169 Type 2 diabetes mellitus with other specified complication: Secondary | ICD-10-CM

## 2021-08-06 DIAGNOSIS — R109 Unspecified abdominal pain: Secondary | ICD-10-CM | POA: Diagnosis not present

## 2021-08-06 LAB — URINALYSIS, ROUTINE W REFLEX MICROSCOPIC
Bilirubin, UA: NEGATIVE
Glucose, UA: NEGATIVE
Ketones, UA: NEGATIVE
Nitrite, UA: NEGATIVE
Protein,UA: NEGATIVE
RBC, UA: NEGATIVE
Specific Gravity, UA: <1.005 — ABNORMAL LOW (ref 1.005–1.030)
Urobilinogen, Ur: 0.2 mg/dL (ref 0.2–1.0)
pH, UA: 5.5 (ref 5.0–7.5)

## 2021-08-06 LAB — MICROSCOPIC EXAMINATION
Bacteria, UA: NONE SEEN
RBC, Urine: NONE SEEN /hpf (ref 0–2)
Renal Epithel, UA: NONE SEEN /hpf

## 2021-08-06 LAB — BAYER DCA HB A1C WAIVED: HB A1C (BAYER DCA - WAIVED): 5.6 % (ref 4.8–5.6)

## 2021-08-06 NOTE — Progress Notes (Signed)
Subjective: CC: ER follow-up for flank pain PCP: [name], DO Stacy [name] is a 65 y.o. female presenting to clinic today for:  1.  ER follow-up for flank pain Patient was seen in the ER few days ago for right-sided flank pain.  She had extensive evaluation that was unremarkable including negative D-dimer, negative cardiac enzymes and negative chest x-ray.  She does report sometimes when she takes a deep breath and the pain is worse.  Denies any dysuria, hematuria.  She did have some associated nausea after morphine administration.  She has Robaxin on hand.  Pain is slightly better today but still present.  She wondered if perhaps her low cholesterol in the setting of Zetia use was to blame.  She has since been off of the Zetia  2. Type 2 Diabetes with hypertension, hyperlipidemia:  On Repatha for cholesterol treatment.  Continued on Cozaar, Ozempic.  Last eye exam: Needs Last foot exam: Up-to-date Last A1c:  Lab Results  Component Value Date   HGBA1C 6.2 08/11/2020   Nephropathy screen indicated?:  On ARB Last flu, zoster and/or pneumovax:  Immunization History  Administered Date(s) Administered   H1N1 10/06/2008   Influenza Inj Mdck Quad With Preservative 08/06/2018   Influenza Split 08/25/2017, 08/06/2018, 07/23/2019   Influenza Whole 08/01/2007, 08/04/2009   Influenza,inj,Quad PF,6+ Mos 08/25/2017   Influenza-Unspecified 08/06/2018, 08/24/2020   Moderna Sars-Covid-2 Vaccination 03/31/2021   PFIZER(Purple Top)SARS-COV-2 Vaccination 01/08/2020, 01/29/2020, 08/24/2020   Pneumococcal Conjugate-13 06/08/2018   Pneumococcal Polysaccharide-23 08/01/2007   Td 08/01/2007   Tdap 01/23/2018   3. Hypothyroidism Compliant with Synthroid 75 mcg daily.  Reports of change in weight, bowel habits or heart palpitations.  ROS: Per HPI  Allergies  Allergen Reactions   Avelox [Moxifloxacin Hcl In Nacl] Hives   Ciprofloxacin     REACTION: Rash   Livalo [Pitavastatin]     Metformin And Related     kidney   Moxifloxacin     REACTION: RASH   Bactrim [Sulfamethoxazole-Trimethoprim] Rash   Statins Other (See Comments)    myalgias   Sulfamethoxazole-Trimethoprim Rash    REACTION: Rash   Past Medical History:  Diagnosis Date   Asthma    COPD, mild ([place])    Spirometerey 3/08   Coronary artery disease    Diabetes mellitus without complication ([facility])    Fatty liver disease, nonalcoholic    GERD (gastroesophageal reflux disease)    Hyperlipidemia    Hypertension    Hypothyroid Since 2003   IBS (irritable bowel syndrome)    Perimenopausal     Current Outpatient Medications:    albuterol (PROVENTIL) (2.5 MG/3ML) 0.083% nebulizer solution, Take 3 mLs (2.5 mg total) by nebulization every 6 (six) hours as needed for wheezing or shortness of breath., Disp: 75 mL, Rfl: 0   albuterol (VENTOLIN HFA) 108 (90 Base) MCG/ACT inhaler, Inhale 2 puffs into the lungs every 6 (six) hours as needed for wheezing or shortness of breath., Disp: 18 g, Rfl: 11   amoxicillin-clavulanate (AUGMENTIN) 875-125 MG tablet, Take 1 tablet by mouth 2 (two) times daily., Disp: 14 tablet, Rfl: 0   Evolocumab (REPATHA) 140 MG/ML SOSY, Inject 140 mg into the skin every 14 (fourteen) days., Disp: 2 mL, Rfl: 12   FLUoxetine (PROZAC) 20 MG capsule, Take 1 capsule (20 mg total) by mouth daily., Disp: 90 capsule, Rfl: 2   levothyroxine (SYNTHROID) 75 MCG tablet, Take 1 tablet (75 mcg total) by mouth daily., Disp: 90 tablet, Rfl: 2   loratadine (  CLARITIN) 10 MG tablet, Take 10 mg by mouth daily., Disp: , Rfl:    losartan (COZAAR) 25 MG tablet, TAKE 1 TABLET BY MOUTH  DAILY, Disp: 90 tablet, Rfl: 0   methocarbamol (ROBAXIN) 500 MG tablet, Take 1 tablet (500 mg total) by mouth 2 (two) times daily., Disp: 20 tablet, Rfl: 0   metoprolol tartrate (LOPRESSOR) 50 MG tablet, Take 50 mg Lopressor 2 hours prior to your CT scan for a heart rate greater than 55., Disp: 1 tablet, Rfl: 0   Omeprazole-Sodium  Bicarbonate (ZEGERID OTC PO), Take 20 mg by mouth daily., Disp: , Rfl:    Semaglutide,0.25 or 0.5MG/DOS, (OZEMPIC, 0.25 OR 0.5 MG/DOSE,) 2 MG/1.5ML SOPN, Inject 0.25 mg into the skin once a week., Disp: , Rfl:    umeclidinium-vilanterol (ANORO ELLIPTA) 62.5-25 MCG/INH AEPB, USE 1 INHALATION BY MOUTH  DAILY, Disp: 180 each, Rfl: 1 Social History   Socioeconomic History   Marital status: Married    Spouse name: Not on file   Number of children: 2   Years of education: Not on file   Highest education level: Associate degree: academic program  Occupational History   Occupation: Works in Science writer   Occupation: Disabled  Tobacco Use   Smoking status: Former    Packs/day: 0.50    Years: 30.00    Pack years: 15.00    Types: Cigarettes    Quit date: 10/25/2007    Years since quitting: 13.7   Smokeless tobacco: Never  Vaping Use   Vaping Use: Never used  Substance and Sexual Activity   Alcohol use: Yes    Alcohol/week: 1.0 standard drink    Types: 1 Glasses of wine per week    Comment: Occasional-once a year   Drug use: No   Sexual activity: Yes    Birth control/protection: Surgical  Other Topics Concern   Not on file  Social History Narrative   Remarried.    Previous occupation: Customer service manager.       Moved from [facility] in [place]   2 children, has stepchildren and grandchildren   Does not exercise   Social Determinants of Health   Financial Resource Strain: Not on file  Food Insecurity: Not on file  Transportation Needs: Not on file  Physical Activity: Not on file  Stress: Not on file  Social Connections: Not on file  Intimate Partner Violence: Not on file   Family History  Problem Relation Age of Onset   Hypertension Father    Hyperlipidemia Father    Heart disease Father        Died at age 59 of MI   Hypothyroidism Father    Diabetes Maternal Grandmother    Raynaud syndrome Sister    Rheum arthritis Sister    Thyroid disease Sister    CAD Brother        has 4 stents   Rheum  arthritis Brother    Pulmonary fibrosis Brother    Heart disease Sister 31       h/o MI _0  32   Thyroid disease Sister    Heart disease Mother    Hypothyroidism Mother    Macular degeneration Mother    Breast cancer Neg Hx    Ovarian cancer Neg Hx    Prostate cancer Neg Hx    Colon cancer Neg Hx    Stomach cancer Neg Hx    Esophageal cancer Neg Hx     Objective: Office vital signs reviewed. BP 119/69   Pulse 61  Temp 98.4 F (36.9 C)   Ht _0  (1.6 m)   Wt 154 lb (69.9 kg)   SpO2 96%   BMI 27.28 kg/m   Physical Examination:  General: Awake, alert, well nourished, No acute distress HEENT: Exophthalmos.  No goiter Cardio: regular rate and rhythm, S1S2 heard, no murmurs appreciated Pulm: clear to auscultation bilaterally, no wheezes, rhonchi or rales; normal work of breathing on room air MSK: CVA TTP on right at approximately rib 9-10  Assessment/ Plan: 65 y.o. female   Controlled type 2 diabetes mellitus with other specified complication, without long-term current use of insulin ([place]) - Plan: Bayer DCA Hb A1c Waived, CANCELED: CMP14+EGFR, CANCELED: Bayer DCA Hb A1c Waived  Hyperlipidemia associated with type 2 diabetes mellitus ([place]) - Plan: CANCELED: LDL Cholesterol, Direct, CANCELED: CMP14+EGFR  Hypertension associated with diabetes ([place]) - Plan: CANCELED: CMP14+EGFR  Acquired hypothyroidism - Plan: TSH, T4, Free, CANCELED: CMP14+EGFR  Right flank pain - Plan: Urinalysis, Routine w reflex microscopic  Sugar remains under excellent control with A1c of 5.6 today.  No changes needed  She is currently being treated with Repatha only since Zetia was discontinued in the setting of cholesterol being apparently too low.  She has future orders by cardiology in place.  Therefore cholesterol was not checked today  Blood pressure controlled.  No changes  Asymptomatic from a thyroid standpoint.  Check TSH, free T4  No evidence of infection or blood on her urinalysis.   Uncertain etiology of the right flank pain but I favor MSK in nature since the remainder of her work-up has been totally normal.  If become severe again, low threshold to perform a CTA renal stone study   No orders of the defined types were placed in this encounter.  No orders of the defined types were placed in this encounter.    [name], DO [place] [phone]

## 2021-08-06 NOTE — Patient Instructions (Signed)
You had labs performed today.  You will be contacted with the results of the labs once they are available, usually in the next 3 business days for routine lab work.  If you have an active my chart account, they will be released to your MyChart.  If you prefer to have these labs released to you via telephone, please let us know.  If you had a pap smear or biopsy performed, expect to be contacted in about 7-10 days.  Flank Pain, Adult Flank pain is pain in your side. The flank is the area on your side between your upper belly (abdomen) and your spine. The pain may occur over a short time (acute), or it may be long-term or come back often (chronic). It may be mild or very bad. Pain in this area can be caused by many different things. Follow these instructions at home:  Drink enough fluid to keep your pee (urine) pale yellow. Rest as told by your doctor. Take over-the-counter and prescription medicines only as told by your doctor. Keep a journal to keep track of: What has caused your flank pain. What has made your flank pain feel better. Keep all follow-up visits. Contact a doctor if: Medicine does not help your pain. You have new symptoms. Your pain gets worse. Your symptoms last longer than 2-3 days. You have trouble peeing. You are peeing more often than normal. Get help right away if: You have trouble breathing. You are short of breath. Your belly hurts, or it is swollen or red. You feel like you may vomit (nauseous). You vomit. You feel faint, or you faint. You have blood in your pee. You have flank pain and a fever. These symptoms may be an emergency. Get help right away. Call your local emergency services (911 in the [place] Do not wait to see if the symptoms will go away. Do not drive yourself to the hospital. Summary Flank pain is pain in your side. The flank is the area of your side between your upper belly (abdomen) and your spine. Flank pain may occur over a short time  (acute), or it may be long-term or come back often (chronic). It may be mild or very bad. Pain in this area can be caused by many different things. Contact your doctor if your symptoms get worse or last longer than 2-3 days. This information is not intended to replace advice given to you by your health care provider. Make sure you discuss any questions you have with your health care provider. Document Revised: 12/21/2020 Document Reviewed: 12/21/2020 Elsevier Patient Education  2022 [employer].

## 2021-08-07 LAB — T4, FREE: Free T4: 1.51 ng/dL (ref 0.82–1.77)

## 2021-08-07 LAB — TSH: TSH: 2.08 u[IU]/mL (ref 0.450–4.500)

## 2021-08-09 ENCOUNTER — Other Ambulatory Visit: Payer: Self-pay | Admitting: Family Medicine

## 2021-08-09 MED ORDER — LEVOTHYROXINE SODIUM 75 MCG PO TABS: 75.0000 ug | ORAL_TABLET | Freq: Every day | ORAL | 3 refills | Status: DC

## 2021-08-17 DIAGNOSIS — E119 Type 2 diabetes mellitus without complications: Secondary | ICD-10-CM | POA: Diagnosis not present

## 2021-08-17 DIAGNOSIS — H524 Presbyopia: Secondary | ICD-10-CM | POA: Diagnosis not present

## 2021-08-26 ENCOUNTER — Other Ambulatory Visit: Payer: Self-pay | Admitting: Family Medicine

## 2021-08-26 DIAGNOSIS — I10 Essential (primary) hypertension: Secondary | ICD-10-CM

## 2021-09-10 ENCOUNTER — Ambulatory Visit: Payer: Medicare Other | Admitting: Family Medicine

## 2021-10-17 ENCOUNTER — Other Ambulatory Visit: Payer: Self-pay | Admitting: Family Medicine

## 2021-10-24 ENCOUNTER — Other Ambulatory Visit: Payer: Self-pay | Admitting: Emergency Medicine

## 2021-10-28 ENCOUNTER — Other Ambulatory Visit: Payer: Self-pay | Admitting: Orthopedic Surgery

## 2021-10-28 DIAGNOSIS — M4726 Other spondylosis with radiculopathy, lumbar region: Secondary | ICD-10-CM

## 2021-11-01 ENCOUNTER — Ambulatory Visit (INDEPENDENT_AMBULATORY_CARE_PROVIDER_SITE_OTHER): Payer: Medicare Other | Admitting: Nurse Practitioner

## 2021-11-01 ENCOUNTER — Encounter: Payer: Self-pay | Admitting: Nurse Practitioner

## 2021-11-01 VITALS — BP 136/82 | HR 68 | Temp 97.5°F | Resp 20 | Ht 63.0 in | Wt 152.0 lb

## 2021-11-01 DIAGNOSIS — J440 Chronic obstructive pulmonary disease with acute lower respiratory infection: Secondary | ICD-10-CM

## 2021-11-01 DIAGNOSIS — J209 Acute bronchitis, unspecified: Secondary | ICD-10-CM | POA: Diagnosis not present

## 2021-11-01 MED ORDER — PREDNISONE 20 MG PO TABS: 40.0000 mg | ORAL_TABLET | Freq: Every day | ORAL | 0 refills | Status: AC

## 2021-11-01 MED ORDER — AZITHROMYCIN 250 MG PO TABS: ORAL_TABLET | ORAL | 0 refills | Status: DC

## 2021-11-01 NOTE — Patient Instructions (Signed)

## 2021-11-01 NOTE — Progress Notes (Signed)
Subjective:    Patient ID: Stacy [name], female    DOB: 1956-03-06, 66 y.o.   MRN: [id number]   Chief Complaint: Cough and Nasal Congestion   HPI Cough This is a new problem. The current episode started in the past 7 days. The problem has been unchanged. The problem occurs constantly. The cough is Productive of purulent sputum. Associated symptoms include rhinorrhea and shortness of breath. Pertinent negatives include no chills, ear congestion, fever or sore throat. Nothing aggravates the symptoms. She has tried OTC cough suppressant for the symptoms. The treatment provided moderate relief. Her past medical history is significant for COPD.      Review of Systems  Constitutional:  Negative for chills and fever.  HENT:  Positive for rhinorrhea. Negative for sore throat.   Respiratory:  Positive for cough and shortness of breath.       Objective:   Physical Exam Vitals reviewed.  Constitutional:      Appearance: Normal appearance.  HENT:     Right Ear: Tympanic membrane normal. There is no impacted cerumen.     Left Ear: Tympanic membrane normal. There is no impacted cerumen.     Nose: Congestion present.  Cardiovascular:     Rate and Rhythm: Normal rate and regular rhythm.     Heart sounds: Normal heart sounds.  Pulmonary:     Effort: Pulmonary effort is normal. No respiratory distress.     Breath sounds: Normal breath sounds. No wheezing.  Skin:    General: Skin is warm.  Neurological:     General: No focal deficit present.     Mental Status: She is alert and oriented to person, place, and time.  Psychiatric:        Mood and Affect: Mood normal.        Behavior: Behavior normal.    BP 136/82    Pulse 68    Temp (!) 97.5 F (36.4 C) (Temporal)    Resp 20    Ht 5\' 3"  (1.6 m)    Wt 152 lb (68.9 kg)    SpO2 96%    BMI 26.93 kg/m         Assessment & Plan:  Stacy [name] in today with chief complaint of Cough and Nasal Congestion   1. Acute bronchitis with COPD  ([place]) 1. Take meds as prescribed 2. Use a cool mist humidifier especially during the winter months and when heat has been humid. 3. Use saline nose sprays frequently 4. Saline irrigations of the nose can be very helpful if done frequently.  * 4X daily for 1 week*  * Use of a nettie pot can be helpful with this. Follow directions with this* 5. Drink plenty of fluids 6. Keep thermostat turn down low 7.For any cough or congestion- mucinex 8. For fever or aces or pains- take tylenol or ibuprofen appropriate for age and weight.  * for fevers greater than 101 orally you may alternate ibuprofen and tylenol every  3 hours.    - azithromycin (ZITHROMAX Z-PAK) 250 MG tablet; As directed  Dispense: 6 tablet; Refill: 0 - predniSONE (DELTASONE) 20 MG tablet; Take 2 tablets (40 mg total) by mouth daily with breakfast for 5 days. 2 po daily for 5 days  Dispense: 10 tablet; Refill: 0    The above assessment and management plan was discussed with the patient. The patient verbalized understanding of and has agreed to the management plan. Patient is aware to call the clinic if symptoms  persist or worsen. Patient is aware when to return to the clinic for a follow-up visit. Patient educated on when it is appropriate to go to the emergency department.   Mary-Margaret Hassell Done, FNP

## 2021-11-16 ENCOUNTER — Other Ambulatory Visit: Payer: Self-pay

## 2021-11-16 ENCOUNTER — Ambulatory Visit
Admission: RE | Admit: 2021-11-16 | Discharge: 2021-11-16 | Disposition: A | Discharge status: Home / Self Care | Payer: Medicare Other | Source: Ambulatory Visit | Attending: Orthopedic Surgery | Admitting: Orthopedic Surgery

## 2021-11-16 DIAGNOSIS — M4726 Other spondylosis with radiculopathy, lumbar region: Secondary | ICD-10-CM

## 2021-11-25 ENCOUNTER — Telehealth: Payer: Self-pay | Admitting: Family Medicine

## 2021-11-25 MED ORDER — LEVOTHYROXINE SODIUM 75 MCG PO TABS: 75.0000 ug | ORAL_TABLET | Freq: Every day | ORAL | 0 refills | Status: DC

## 2021-11-25 NOTE — Telephone Encounter (Signed)
Called and spoke with patient she did not see pcp she saw another provider for acute  refill sent before appointment

## 2021-11-25 NOTE — Telephone Encounter (Signed)
°  Prescription Request  11/25/2021  Is this a "Controlled Substance" medicine? no  Have you seen your PCP in the last 2 weeks? 11/01/21  If YES, route message to pool  -  If NO, patient needs to be scheduled for appointment.  What is the name of the medication or equipment? She needs her thyroid meds  and she said any other meds that is due to be called in  Have you contacted your pharmacy to request a refill? yes   Which pharmacy would you like this sent to? Mail order   Patient notified that their request is being sent to the clinical staff for review and that they should receive a response within 2 business days.

## 2021-11-30 ENCOUNTER — Ambulatory Visit: Payer: Medicare Other | Admitting: Emergency Medicine

## 2021-11-30 ENCOUNTER — Encounter: Payer: Self-pay | Admitting: Emergency Medicine

## 2021-11-30 ENCOUNTER — Other Ambulatory Visit: Payer: Self-pay

## 2021-11-30 DIAGNOSIS — J301 Allergic rhinitis due to pollen: Secondary | ICD-10-CM | POA: Diagnosis not present

## 2021-11-30 DIAGNOSIS — J449 Chronic obstructive pulmonary disease, unspecified: Secondary | ICD-10-CM

## 2021-11-30 MED ORDER — TRELEGY ELLIPTA 100-62.5-25 MCG/ACT IN AEPB: 1.0000 | INHALATION_SPRAY | Freq: Every day | RESPIRATORY_TRACT | 0 refills | Status: DC

## 2021-11-30 NOTE — Progress Notes (Signed)
Subjective:    Patient ID: Stacy [name], female    DOB: 04/23/1956, 66 y.o.   MRN: [id number]  Shortness of Breath Pertinent negatives include no headaches, leg swelling or wheezing.   ROV 11/05/18 --Stacy [name] has COPD with asthmatic features and a bronchodilator response, chronic cough in the setting of this and also allergic rhinitis and GERD.  She has regional, upper lobe predominant emphysematous change and I referred her to [facility] for possible endobronchial valve placement.  She reports today that she was seen there, underwent repeat PFT, ABG, CT. She may be a good candidate, but at this point they believed that this was premature. She did pulm rehab, is now going to the [facility]. She feels that her breathing is a bit better. She is on Anoro, uses albuterol about once a month. Remains on loraradine, zegerid. She has cough, has had more mucous over the last week or so.   ROV 06/15/20 --follow-up visit 66 year old woman with COPD and asthmatic features, bronchodilator response, chronic cough in the setting of rhinitis and GERD.  She has focal bullous disease, was considered for possible endobronchial valve placement.  In the end this was deferred. We have been managing her on Anoro.  She has albuterol which she uses rarely. Her last AE was last month, treated with prednisone. Was her first flare in many months.  She is on loratadine, Zegerid. Still on Anoro, benefits from it.  No cough or wheeze since the recent exacerbation. Good functional capacity. She care for her mother. Is active. Not going to gym due to COVID risks.   Underwent Ct chest at [facility] 11/28/19 that I reviewed, shows stable apical scarring and her bullous emphysematous change.   ROV 11/30/21 --  66 yo woman with a history of COPD with positive bronchodilator response.  She also has chronic cough due to this as well as allergic rhinitis and GERD.  [facility] for possible endobronchial valve placement given some focal bullous disease, this was  deferred given her current functional capacity.  On Anoro.  Today she reports that she has had 2 flares in the last year. She has had some decreased functional capacity - but notes that she is not currently doing pulm rehab, [facility], feels that this has impacted her. She uses albuterol about once a month. She tries to avoid it. She would like to restart exercise again. She is able to walk through a store, then gets SOB.  Remains on loratadine.     Review of Systems  HENT:  Positive for congestion. Negative for postnasal drip and sinus pressure.   Respiratory:  Negative for cough, chest tightness, shortness of breath and wheezing.   Cardiovascular:  Negative for palpitations and leg swelling.  Allergic/Immunologic: Negative.   Neurological:  Negative for headaches.  Psychiatric/Behavioral:  The patient is not nervous/anxious.    Past Medical History:  Diagnosis Date   Asthma    COPD, mild ([place])    Spirom[place] 3/08   Coronary artery disease    Diabetes mellitus without complication (HCC)    Fatty liver disease, nonalcoholic    GERD (gastroesophageal reflux disease)    Hyperlipidemia    Hypertension    Hypothyroid Since 2003   IBS (irritable bowel syndrome)    Perimenopausal      Family History  Problem Relation Age of Onset   Hypertension Father    Hyperlipidemia Father    Heart disease Father        Died at age 54 of  MI   Hypothyroidism Father    Diabetes Maternal Grandmother    Raynaud syndrome Sister    Rheum arthritis Sister    Thyroid disease Sister    CAD Brother        has 4 stents   Rheum arthritis Brother    Pulmonary fibrosis Brother    Heart disease Sister 41       h/o MI @age  22   Thyroid disease Sister    Heart disease Mother    Hypothyroidism Mother    Macular degeneration Mother    Breast cancer Neg Hx    Ovarian cancer Neg Hx    Prostate cancer Neg Hx    Colon cancer Neg Hx    Stomach cancer Neg Hx    Esophageal cancer Neg Hx     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  No other occupational exposure.  Has lived in [place] and [place] No military   Allergies  Allergen Reactions   Avelox [Moxifloxacin Hcl In Nacl] Hives   Ciprofloxacin     REACTION: Rash   Livalo [Pitavastatin]    Metformin And Related     kidney   Moxifloxacin     REACTION: RASH   Bactrim [Sulfamethoxazole-Trimethoprim] Rash   Statins Other (See Comments)    myalgias   Sulfamethoxazole-Trimethoprim Rash    REACTION: Rash     Outpatient Medications Prior to Visit  Medication Sig Dispense Refill   albuterol (PROVENTIL) (2.5 MG/3ML) 0.083% nebulizer solution Take 3 mLs (2.5 mg total) by nebulization every 6 (six) hours as needed for wheezing or shortness of breath. 75 mL 0   albuterol (VENTOLIN HFA) 108 (90 Base) MCG/ACT inhaler Inhale 2 puffs into the lungs every 6 (six) hours as needed for wheezing or shortness of breath. 18 g 11   azithromycin (ZITHROMAX Z-PAK) 250 MG tablet As directed 6 tablet 0   Evolocumab (REPATHA) 140 MG/ML SOSY Inject 140 mg into the skin every 14 (fourteen) days. 2 mL 12   FLUoxetine (PROZAC) 20 MG capsule Take 1 capsule (20 mg total) by mouth daily. 90 capsule 2   levothyroxine (SYNTHROID) 75 MCG tablet Take 1 tablet (75 mcg total) by mouth daily. 90 tablet 0   loratadine (CLARITIN) 10 MG tablet Take 10 mg by mouth daily.     losartan (COZAAR) 25 MG tablet TAKE 1 TABLET BY MOUTH ONCE DAILY 90 tablet 0   methocarbamol (ROBAXIN) 500 MG tablet Take 1 tablet (500 mg total) by mouth 2 (two) times daily. 20 tablet 0   metoprolol tartrate (LOPRESSOR) 50 MG tablet Take 50 mg Lopressor 2 hours prior to your CT scan for a heart rate greater than 55. 1 tablet 0   Omeprazole-Sodium Bicarbonate (ZEGERID OTC PO) Take 20 mg by mouth daily.     umeclidinium-vilanterol (ANORO ELLIPTA) 62.5-25 MCG/INH AEPB USE 1 INHALATION BY MOUTH  DAILY 180 each 1   Semaglutide,0.25 or 0.5MG /DOS, (OZEMPIC, 0.25 OR 0.5 MG/DOSE,) 2 MG/1.5ML SOPN Inject 0.25 mg into the  skin once a week. (Patient not taking: Reported on 11/30/2021)     No facility-administered medications prior to visit.        Objective:   Physical Exam Vitals:   11/30/21 1433  BP: 118/66  Pulse: 67  Temp: 98.1 F (36.7 C)  TempSrc: Oral  SpO2: 98%  Weight: 155 lb 3.2 oz (70.4 kg)  Height: 5\' 3"  (1.6 m)   Gen: Pleasant, well-nourished, in no distress,  normal affect  ENT: No  lesions,  mouth clear,  oropharynx clear, no postnasal drip  Neck: No JVD, no stridor  Lungs: No use of accessory muscles, clear without rales or rhonchi on a normal breath.  She does have wheezing on forced expiration bilaterally.  Cardiovascular: RRR, heart sounds normal, no murmur or gallops, no peripheral edema  Musculoskeletal: No deformities, no cyanosis or clubbing  Neuro: alert, non focal  Skin: Warm, no lesions or rash       Assessment & Plan:  COPD with asthma ([place]) More daily symptoms.  She had 2 exacerbations in the last year.  I think will be reasonable to do a trial of changing Anoro to LABA/LAMA/ICS.  We will try Trelegy.  I will also refer her back to pulmonary rehab.  If this does not help her functional capacity then we may want to reconsider referral to [facility] regarding endobronchial valve placement.  Allergic rhinitis Continue loratadine  Baltazar Apo, MD, PhD 11/30/2021, 2:57 PM [facility] [phone] or if no answer [phone]

## 2021-11-30 NOTE — Assessment & Plan Note (Signed)
Continue loratadine 

## 2021-11-30 NOTE — Patient Instructions (Addendum)
Hold Anoro temporarily.  We will try starting Trelegy 1 inhalation once daily.  Rinse and gargle after using.  Keep track of whether you benefit from this medication.  If so please call us so we can continue it going forward. Keep your albuterol available to use 2 puffs or 1 nebulizer treatment if needed for shortness of breath, chest tightness, wheezing. We will refer you to pulmonary rehab at [place] loratadine once daily.  Follow with Dr Lamonte Sakai in 4 months or sooner if you have any problems.

## 2021-11-30 NOTE — Assessment & Plan Note (Signed)
More daily symptoms.  She had 2 exacerbations in the last year.  I think will be reasonable to do a trial of changing Anoro to LABA/LAMA/ICS.  We will try Trelegy.  I will also refer her back to pulmonary rehab.  If this does not help her functional capacity then we may want to reconsider referral to [facility] regarding endobronchial valve placement.

## 2021-11-30 NOTE — Addendum Note (Signed)
Addended by: Gavin Potters R on: 11/30/2021 03:04 PM   Modules accepted: Orders

## 2021-12-07 ENCOUNTER — Other Ambulatory Visit: Payer: Self-pay

## 2021-12-07 DIAGNOSIS — R739 Hyperglycemia, unspecified: Secondary | ICD-10-CM | POA: Insufficient documentation

## 2021-12-07 DIAGNOSIS — M67449 Ganglion, unspecified hand: Secondary | ICD-10-CM | POA: Insufficient documentation

## 2021-12-07 DIAGNOSIS — I2510 Atherosclerotic heart disease of native coronary artery without angina pectoris: Secondary | ICD-10-CM | POA: Insufficient documentation

## 2021-12-07 DIAGNOSIS — R1310 Dysphagia, unspecified: Secondary | ICD-10-CM

## 2021-12-07 DIAGNOSIS — K649 Unspecified hemorrhoids: Secondary | ICD-10-CM | POA: Insufficient documentation

## 2021-12-07 DIAGNOSIS — R899 Unspecified abnormal finding in specimens from other organs, systems and tissues: Secondary | ICD-10-CM | POA: Insufficient documentation

## 2021-12-07 DIAGNOSIS — N941 Unspecified dyspareunia: Secondary | ICD-10-CM | POA: Insufficient documentation

## 2021-12-07 DIAGNOSIS — J45909 Unspecified asthma, uncomplicated: Secondary | ICD-10-CM

## 2021-12-07 DIAGNOSIS — I10 Essential (primary) hypertension: Secondary | ICD-10-CM | POA: Insufficient documentation

## 2021-12-07 DIAGNOSIS — N952 Postmenopausal atrophic vaginitis: Secondary | ICD-10-CM | POA: Insufficient documentation

## 2021-12-07 DIAGNOSIS — Z780 Asymptomatic menopausal state: Secondary | ICD-10-CM

## 2021-12-07 DIAGNOSIS — K635 Polyp of colon: Secondary | ICD-10-CM | POA: Insufficient documentation

## 2021-12-07 HISTORY — DX: Unspecified hemorrhoids: K64.9

## 2021-12-07 HISTORY — DX: Unspecified abnormal finding in specimens from other organs, systems and tissues: R89.9

## 2021-12-07 HISTORY — DX: Dysphagia, unspecified: R13.10

## 2021-12-07 HISTORY — DX: Unspecified asthma, uncomplicated: J45.909

## 2021-12-07 HISTORY — DX: Asymptomatic menopausal state: Z78.0

## 2021-12-07 HISTORY — DX: Polyp of colon: K63.5

## 2021-12-07 HISTORY — DX: Unspecified dyspareunia: N94.10

## 2021-12-07 HISTORY — DX: Hyperglycemia, unspecified: R73.9

## 2021-12-07 HISTORY — DX: Ganglion, unspecified hand: M67.449

## 2021-12-07 HISTORY — DX: Essential (primary) hypertension: I10

## 2021-12-07 HISTORY — DX: Postmenopausal atrophic vaginitis: N95.2

## 2021-12-09 ENCOUNTER — Telehealth: Payer: Self-pay | Admitting: Emergency Medicine

## 2021-12-09 DIAGNOSIS — J432 Centrilobular emphysema: Secondary | ICD-10-CM

## 2021-12-09 DIAGNOSIS — J449 Chronic obstructive pulmonary disease, unspecified: Secondary | ICD-10-CM

## 2021-12-09 MED ORDER — BREZTRI AEROSPHERE 160-9-4.8 MCG/ACT IN AERO: 2.0000 | INHALATION_SPRAY | Freq: Two times a day (BID) | RESPIRATORY_TRACT | 5 refills | Status: DC

## 2021-12-09 NOTE — Telephone Encounter (Signed)
Yes ok to try changing to breztri

## 2021-12-09 NOTE — Telephone Encounter (Signed)
Spoke to patient.  She would like to try [facility] because trelegy causes voice hoarseness. She rinses her mouth out after each use.  She also stated that she can not start pulmonary rehab until she has a PFT.  Dr. Lamonte Sakai, can we use SOB dx for PFT? Can she try Breztri?

## 2021-12-09 NOTE — Telephone Encounter (Signed)
Called and spoke with patient.  Dr. Agustina Caroli recommendations to change to [facility] given. Patient requested [facility] [place].  Patient also wanted to know if Dr. Lamonte Sakai was ok with her having a PFT.  Patient stated pulmonary rehab stated she could not start until PFT completed.  Message routed to Dr. Lamonte Sakai to advise

## 2021-12-10 ENCOUNTER — Other Ambulatory Visit: Payer: Self-pay

## 2021-12-10 ENCOUNTER — Ambulatory Visit: Payer: Medicare Other | Admitting: Cardiology

## 2021-12-10 ENCOUNTER — Encounter: Payer: Self-pay | Admitting: Cardiology

## 2021-12-10 VITALS — BP 136/70 | HR 62 | Ht 63.6 in | Wt 156.0 lb

## 2021-12-10 DIAGNOSIS — I209 Angina pectoris, unspecified: Secondary | ICD-10-CM

## 2021-12-10 DIAGNOSIS — E039 Hypothyroidism, unspecified: Secondary | ICD-10-CM

## 2021-12-10 DIAGNOSIS — E782 Mixed hyperlipidemia: Secondary | ICD-10-CM | POA: Diagnosis not present

## 2021-12-10 DIAGNOSIS — I10 Essential (primary) hypertension: Secondary | ICD-10-CM

## 2021-12-10 DIAGNOSIS — Z789 Other specified health status: Secondary | ICD-10-CM

## 2021-12-10 DIAGNOSIS — E118 Type 2 diabetes mellitus with unspecified complications: Secondary | ICD-10-CM

## 2021-12-10 DIAGNOSIS — I2510 Atherosclerotic heart disease of native coronary artery without angina pectoris: Secondary | ICD-10-CM | POA: Diagnosis not present

## 2021-12-10 DIAGNOSIS — Z1321 Encounter for screening for nutritional disorder: Secondary | ICD-10-CM

## 2021-12-10 NOTE — Telephone Encounter (Signed)
Called and spoke with pt letting her know the info per RB and she verbalized understanding. Have scheduled pt for the PFT. Nothing further needed.

## 2021-12-10 NOTE — Patient Instructions (Signed)
Medication Instructions:  Your physician recommends that you continue on your current medications as directed. Please refer to the Current Medication list given to you today.  *If you need a refill on your cardiac medications before your next appointment, please call your pharmacy*   Lab Work: Your physician recommends that you have labs done in the office today. Your test included  basic metabolic panel, complete blood count, TSH, hgb A1C, vitamin D, liver function and lipids.  If you have labs (blood work) drawn today and your tests are completely normal, you will receive your results only by: [place] (if you have MyChart) OR A paper copy in the mail If you have any lab test that is abnormal or we need to change your treatment, we will call you to review the results.   Testing/Procedures: None ordered   Follow-Up: At [facility], you and your health needs are our priority.  As part of our continuing mission to provide you with exceptional heart care, we have created designated Provider Care Teams.  These Care Teams include your primary Cardiologist (physician) and Advanced Practice Providers (APPs -  Physician Assistants and Nurse Practitioners) who all work together to provide you with the care you need, when you need it.  We recommend signing up for the patient portal called "MyChart".  Sign up information is provided on this After Visit Summary.  MyChart is used to connect with patients for Virtual Visits (Telemedicine).  Patients are able to view lab/test results, encounter notes, upcoming appointments, etc.  Non-urgent messages can be sent to your provider as well.   To learn more about what you can do with MyChart, go to [email or web address].    Your next appointment:   9 month(s)  The format for your next appointment:   In Person  Provider:   Jyl Heinz, MD   Other Instructions NA

## 2021-12-10 NOTE — Progress Notes (Signed)
Cardiology Office Note:    Date:  12/10/2021   ID:  Stacy [name], DOB Feb 26, 1956, MRN [id number]  PCP:  Stacy Norlander, DO  Cardiologist:  Stacy Lindau, MD   Referring MD: Stacy Norlander, DO    ASSESSMENT:    1. Mixed hyperlipidemia   2. Essential hypertension   3. Coronary artery disease involving native coronary artery of native heart without angina pectoris   4. Angina pectoris (HCC)   5. Statin intolerance   6. Acquired hypothyroidism   7. Controlled type 2 diabetes mellitus with complication, without long-term current use of insulin ([place])   8. Encounter for vitamin deficiency screening    PLAN:    In order of problems listed above:  Elevated calcium score: I discussed my findings with the patient at extensive length and questions were answered to her satisfaction.  She was advised to walk at least half an hour a day 5 days a week and she promises to do so. Essential hypertension: Blood pressure is stable and lifestyle modification was urged. Mixed dyslipidemia: We will check lipids today and complete blood work and advise her accordingly.  She needs to be on statin medications based on the findings of the calcium score and she is agreeable. She is she gives history of vitamin D deficiency and we will check this.  We will also do diabetes screening with hemoglobin A1c score. Patient will be seen in follow-up appointment in 6 months or earlier if the patient has any concerns    Medication Adjustments/Labs and Tests Ordered: Current medicines are reviewed at length with the patient today.  Concerns regarding medicines are outlined above.  Orders Placed This Encounter  Procedures   Basic metabolic panel   CBC with Differential/Platelet   Hepatic function panel   Lipid panel   TSH   VITAMIN D 25 Hydroxy (Vit-D Deficiency, Fractures)   Hemoglobin A1c   No orders of the defined types were placed in this encounter.    No chief complaint on file.     History of Present Illness:    Stacy [name] is a 66 y.o. female.  Patient has past medical history of elevated calcium score, essential hypertension and dyslipidemia.  She denies any problems at this time and takes care of activities of daily living.  No chest pain orthopnea or PND.  At the time of my evaluation, the patient is alert awake oriented and in no distress.  Past Medical History:  Diagnosis Date   Acute bronchitis with COPD ([place]) 01/18/2010   Qualifier: Diagnosis of  By: Stacy Close DO, Stacy     Allergic rhinitis 02/25/2008   Qualifier: Diagnosis of  By: Stacy Close DO, Stacy     Angina pectoris ([place]) 06/09/2021   Aortic atherosclerosis ([place]) 08/14/2017   Appreciated on CXR 08/14/2017   Asthma    Asthmatic bronchitis 12/07/2021   Atrophy of vagina 12/07/2021   Centrilobular emphysema ([place]) 05/28/2018   Controlled diabetes mellitus type 2 with complications ([place]) 10/24/7508   Has FLD.   COPD with asthma ([place]) 11/02/2007   Qualifier: Diagnosis of  By: Stacy Close DO, Stacy     Coronary artery disease    Drug-induced myopathy 04/21/2020   Dysphagia 12/07/2021   Dyspnea on exertion 08/21/2018   Essential hypertension 12/07/2021   Family history of osteoporosis 04/22/2019   Family history of rheumatoid arthritis 06/05/2019   FATTY LIVER DISEASE 08/06/2008   Qualifier: Diagnosis of  By: Stacy [name]  liver disease, nonalcoholic    Ganglion of hand 12/07/2021   GERD 11/02/2007   Qualifier: Diagnosis of  By: Stacy Close DO, Stacy     Hemorrhoids 12/07/2021   Hyperglycemia 12/07/2021   Hyperlipidemia associated with type 2 diabetes mellitus ([place]) 03/24/2020   Hypersomnia with sleep apnea 05/28/2018   HYPERTENSION, BENIGN 12/24/2008   Qualifier: Diagnosis of  By: Stacy Breed, MD, Stacy [name]    Hypothyroidism 06/13/2007   ? Hashimotos.  Has strong family hx autoimmune d/o No h/o thyroidectomy or radioablation.    IBS (irritable bowel syndrome)    Lateral epicondylitis of left elbow 06/05/2019   Lung  disease, bullous ([place]) 08/14/2017   Menopause 12/07/2021   Mixed hyperlipidemia 09/19/2019   Formatting of this note might be different from the original. LDL goal 70 ( DM2, HTN, aortic atherosclerosis)   Obesity (BMI 30.0-34.9) 05/28/2018   Overactive bladder 06/09/2017   Overweight with body mass index (BMI) of 28 to 28.9 in adult 04/22/2019   Pain in female genitalia on intercourse 12/07/2021   Perimenopausal    PERIMENOPAUSAL SYNDROME 05/13/2009   Qualifier: Diagnosis of  By: Stacy Close DO, Stacy     Polyp of colon 12/07/2021   Post-menopausal 04/22/2019   Precordial pain    Sees Dr Stacy [name].  Last cardiac cath 2016 = negative. Strong family h/o MI and CAD   Psychophysiological insomnia 05/28/2018   Pure hypercholesterolemia 12/24/2008   Formatting of this note might be different from the original. Overview:  Qualifier: Diagnosis of  By: Stacy Breed, MD, FACC, [place]:  Management per primary care.   Reactive depression 06/26/2018   Rosacea 08/01/2007   Qualifier: Diagnosis of  By: Stacy Close DO, Stacy     Snoring 05/28/2018   Stage 2 moderate COPD by GOLD classification ([place]) 11/02/2007   Formatting of this note might be different from the original. PFT 05/28/2019           ratio 50% FEV1 53% FVC 83%    DLCO 54% PFT 09/25/18 ([facility]) ratio 52% FEV1 56% FVC 108%; DLCO 69%   Statin intolerance 04/10/2019   Stress incontinence in female 06/09/2017   Unspecified abnormal finding in specimens from other organs, systems and tissues 12/07/2021   UTI'S, RECURRENT 06/13/2007   Qualifier: Diagnosis of  By: Stacy [name]      Past Surgical History:  Procedure Laterality Date   ABLATION     CARDIAC CATHETERIZATION N/A 06/12/2015   Procedure: Left Heart Cath and Coronary Angiography;  Surgeon: Stacy Blanks, MD;  Location: [facility];  Service: Cardiovascular;  Laterality: N/A;   CESAREAN SECTION     LTCS     x2   SPIROMETRY  01/05/2007   FVC 68% predicted; FEV1 44% predicted;  FEV1/FVC 63% predicted = moderate obstruction with low vital capacity possibly due to restriction   TUBAL LIGATION      Current Medications: Current Meds  Medication Sig   albuterol (PROVENTIL) (2.5 MG/3ML) 0.083% nebulizer solution Take 3 mLs (2.5 mg total) by nebulization every 6 (six) hours as needed for wheezing or shortness of breath.   albuterol (VENTOLIN HFA) 108 (90 Base) MCG/ACT inhaler Inhale 2 puffs into the lungs every 6 (six) hours as needed for wheezing or shortness of breath.   Budeson-Glycopyrrol-Formoterol (BREZTRI AEROSPHERE) 160-9-4.8 MCG/ACT AERO Inhale 2 puffs into the lungs in the morning and at bedtime.   Evolocumab (REPATHA) 140 MG/ML SOSY Inject 140 mg into the skin every 14 (fourteen)  days.   FLUoxetine (PROZAC) 20 MG capsule Take 1 capsule (20 mg total) by mouth daily.   levothyroxine (SYNTHROID) 75 MCG tablet Take 1 tablet (75 mcg total) by mouth daily.   loratadine (CLARITIN) 10 MG tablet Take 10 mg by mouth daily.   losartan (COZAAR) 25 MG tablet TAKE 1 TABLET BY MOUTH ONCE DAILY   methocarbamol (ROBAXIN) 500 MG tablet Take 1 tablet (500 mg total) by mouth 2 (two) times daily.   omeprazole (PRILOSEC) 20 MG capsule Take 20 mg by mouth daily.   Semaglutide,0.25 or 0.5MG /DOS, (OZEMPIC, 0.25 OR 0.5 MG/DOSE,) 2 MG/1.5ML SOPN Inject 0.25 mg into the skin once a week.     Allergies:   Avelox [moxifloxacin hcl in nacl], Ciprofloxacin, Livalo [pitavastatin], Metformin and related, Moxifloxacin, Bactrim [sulfamethoxazole-trimethoprim], Statins, and Sulfamethoxazole-trimethoprim   Social History   Socioeconomic History   Marital status: Married    Spouse name: Not on file   Number of children: 2   Years of education: Not on file   Highest education level: Associate degree: academic program  Occupational History   Occupation: Works in Science writer   Occupation: Disabled  Tobacco Use   Smoking status: Former    Packs/day: 0.50    Years: 30.00    Pack years: 15.00     Types: Cigarettes    Quit date: 10/25/2007    Years since quitting: 14.1   Smokeless tobacco: Never  Vaping Use   Vaping Use: Never used  Substance and Sexual Activity   Alcohol use: Yes    Alcohol/week: 1.0 standard drink    Types: 1 Glasses of wine per week    Comment: Occasional-once a year   Drug use: No   Sexual activity: Yes    Birth control/protection: Surgical  Other Topics Concern   Not on file  Social History Narrative   Remarried.    Previous occupation: Customer service manager.       Moved from [facility] in 2007   2 children, has stepchildren and grandchildren   Does not exercise   Social Determinants of Health   Financial Resource Strain: Not on file  Food Insecurity: Not on file  Transportation Needs: Not on file  Physical Activity: Not on file  Stress: Not on file  Social Connections: Not on file     Family History: The patient's family history includes CAD in her brother; Diabetes in her maternal grandmother; Heart disease in her father and mother; Heart disease (age of onset: 89) in her sister; Hyperlipidemia in her father; Hypertension in her father; Hypothyroidism in her father and mother; Macular degeneration in her mother; Pulmonary fibrosis in her brother; Raynaud syndrome in her sister; Rheum arthritis in her brother and sister; Thyroid disease in her sister and sister. There is no history of Breast cancer, Ovarian cancer, Prostate cancer, Colon cancer, Stomach cancer, or Esophageal cancer.  ROS:   Please see the history of present illness.    All other systems reviewed and are negative.  EKGs/Labs/Other Studies Reviewed:    The following studies were reviewed today: I discussed my findings with the patient at length.   Recent Labs: 08/04/2021: ALT 33; BUN 11; Creatinine, Ser 0.80; Hemoglobin 14.6; Platelets 310; Potassium 4.6; Sodium 139 08/06/2021: TSH 2.080  Recent Lipid Panel    Component Value Date/Time   CHOL 102 08/03/2021 0932   TRIG 137 08/03/2021 0932    HDL 47 08/03/2021 0932   CHOLHDL 2.2 08/03/2021 0932   CHOLHDL 3.3 Ratio 08/05/2009 2054   VLDL 21  08/05/2009 2054   [place] 31 08/03/2021 0932    Physical Exam:    VS:  BP 136/70    Pulse 62    Ht 5' 3.6" (1.615 m)    Wt 156 lb (70.8 kg)    SpO2 94%    BMI 27.12 kg/m     Wt Readings from Last 3 Encounters:  12/10/21 156 lb (70.8 kg)  11/30/21 155 lb 3.2 oz (70.4 kg)  11/01/21 152 lb (68.9 kg)     GEN: Patient is in no acute distress HEENT: Normal NECK: No JVD; No carotid bruits LYMPHATICS: No lymphadenopathy CARDIAC: Hear sounds regular, 2/6 systolic murmur at the apex. RESPIRATORY:  Clear to auscultation without rales, wheezing or rhonchi  ABDOMEN: Soft, non-tender, non-distended MUSCULOSKELETAL:  No edema; No deformity  SKIN: Warm and dry NEUROLOGIC:  Alert and oriented x 3 PSYCHIATRIC:  Normal affect   Signed, Stacy Lindau, MD  12/10/2021 4:31 PM    [facility]

## 2021-12-10 NOTE — Telephone Encounter (Signed)
Yes, please order PFT

## 2021-12-11 LAB — LIPID PANEL
Chol/HDL Ratio: 3.5 ratio (ref 0.0–4.4)
Cholesterol, Total: 192 mg/dL (ref 100–199)
HDL: 55 mg/dL (ref >39)
LDL Chol Calc (NIH): 115 mg/dL — ABNORMAL HIGH (ref 0–99)
Triglycerides: 125 mg/dL (ref 0–149)
VLDL Cholesterol Cal: 22 mg/dL (ref 5–40)

## 2021-12-11 LAB — BASIC METABOLIC PANEL
BUN/Creatinine Ratio: 14 (ref 12–28)
BUN: 12 mg/dL (ref 8–27)
CO2: 27 mmol/L (ref 20–29)
Calcium: 9.9 mg/dL (ref 8.7–10.3)
Chloride: 102 mmol/L (ref 96–106)
Creatinine, Ser: 0.84 mg/dL (ref 0.57–1.00)
Glucose: 111 mg/dL — ABNORMAL HIGH (ref 70–99)
Potassium: 4.9 mmol/L (ref 3.5–5.2)
Sodium: 141 mmol/L (ref 134–144)
eGFR: 78 mL/min/{1.73_m2} (ref >59)

## 2021-12-11 LAB — CBC WITH DIFFERENTIAL/PLATELET
Basophils Absolute: 0.1 10*3/uL (ref 0.0–0.2)
Basos: 1 %
EOS (ABSOLUTE): 0.2 10*3/uL (ref 0.0–0.4)
Eos: 2 %
Hematocrit: 44.2 % (ref 34.0–46.6)
Hemoglobin: 15.1 g/dL (ref 11.1–15.9)
Immature Grans (Abs): 0 10*3/uL (ref 0.0–0.1)
Immature Granulocytes: 0 %
Lymphocytes Absolute: 2.9 10*3/uL (ref 0.7–3.1)
Lymphs: 31 %
MCH: 32.3 pg (ref 26.6–33.0)
MCHC: 34.2 g/dL (ref 31.5–35.7)
MCV: 94 fL (ref 79–97)
Monocytes Absolute: 0.8 10*3/uL (ref 0.1–0.9)
Monocytes: 9 %
Neutrophils Absolute: 5.2 10*3/uL (ref 1.4–7.0)
Neutrophils: 57 %
Platelets: 300 10*3/uL (ref 150–450)
RBC: 4.68 x10E6/uL (ref 3.77–5.28)
RDW: 12.9 % (ref 11.7–15.4)
WBC: 9.1 10*3/uL (ref 3.4–10.8)

## 2021-12-11 LAB — HEPATIC FUNCTION PANEL
ALT: 36 IU/L — ABNORMAL HIGH (ref 0–32)
AST: 26 IU/L (ref 0–40)
Albumin: 4.6 g/dL (ref 3.8–4.8)
Alkaline Phosphatase: 68 IU/L (ref 44–121)
Bilirubin Total: 0.5 mg/dL (ref 0.0–1.2)
Bilirubin, Direct: 0.12 mg/dL (ref 0.00–0.40)
Total Protein: 6.6 g/dL (ref 6.0–8.5)

## 2021-12-11 LAB — HEMOGLOBIN A1C
Est. average glucose Bld gHb Est-mCnc: 143 mg/dL
Hgb A1c MFr Bld: 6.6 % — ABNORMAL HIGH (ref 4.8–5.6)

## 2021-12-11 LAB — TSH: TSH: 1.74 u[IU]/mL (ref 0.450–4.500)

## 2021-12-11 LAB — VITAMIN D 25 HYDROXY (VIT D DEFICIENCY, FRACTURES): Vit D, 25-Hydroxy: 29.1 ng/mL — ABNORMAL LOW (ref 30.0–100.0)

## 2021-12-13 ENCOUNTER — Telehealth: Payer: Self-pay

## 2021-12-13 NOTE — Telephone Encounter (Signed)
Tried calling patient. No answer and no voicemail set up for me to leave a message. 

## 2021-12-13 NOTE — Telephone Encounter (Signed)
-----   Message from Jenean Lindau, MD sent at 12/13/2021  9:46 AM EST ----- Lipid elevated atorva 10 and ll in one month. Talk to pcp about other abnormal labs. Liver mildly elevated. Jenean Lindau, MD 12/13/2021 9:43 AM

## 2021-12-14 ENCOUNTER — Telehealth: Payer: Self-pay

## 2021-12-14 IMAGING — CT CT ANGIO CHEST
3 of 9 series · 18 of 36 positions shown · IV contrast (Omnipaque)
Comparison: 11/28/2019

CLINICAL DATA: 64-year-old female with shortness of breath, concern
for pulmonary embolism.

EXAM:
CT ANGIOGRAPHY CHEST WITH CONTRAST
TECHNIQUE: Multidetector CT imaging of the chest was performed using the
standard protocol during bolus administration of intravenous
contrast. Multiplanar CT image reconstructions and MIPs were
obtained to evaluate the vascular anatomy.
CONTRAST:  Eighty-two mL Omnipaque 350, intravenous

[Series 5: pe thins · axial · 0.77mm/px · z∈[+938,+1152]mm · 14 of 249 slices shown]
[im 17/249  lung]
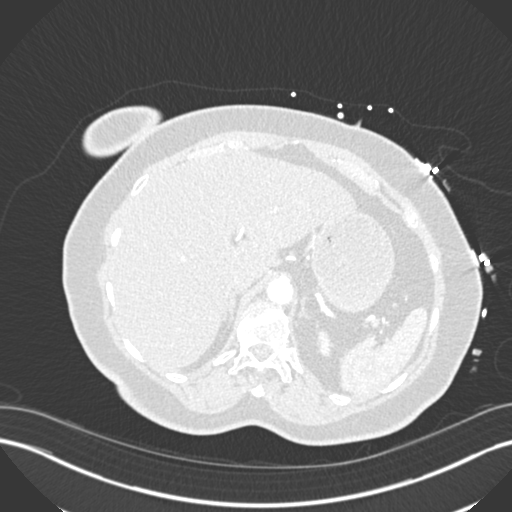
[im 34/249  mediastinal]
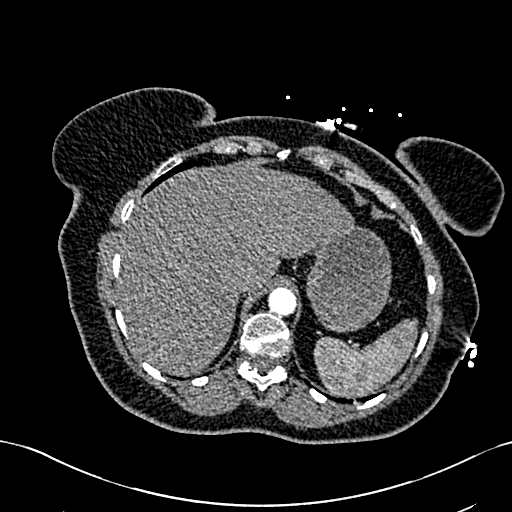
[im 50/249  lung]
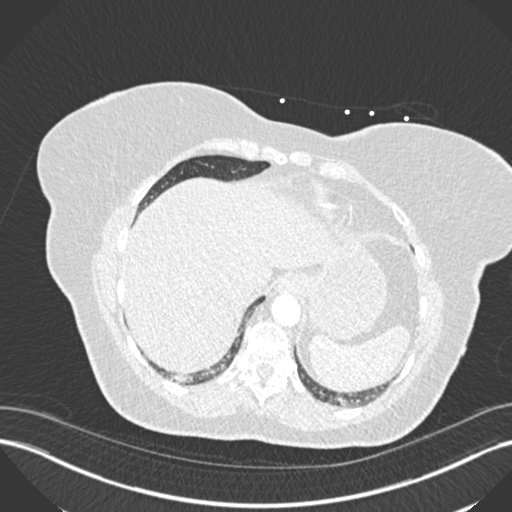
[im 67/249  mediastinal]
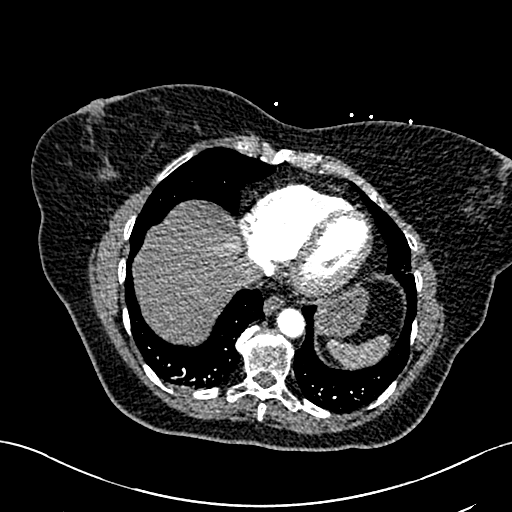
[im 83/249  lung]
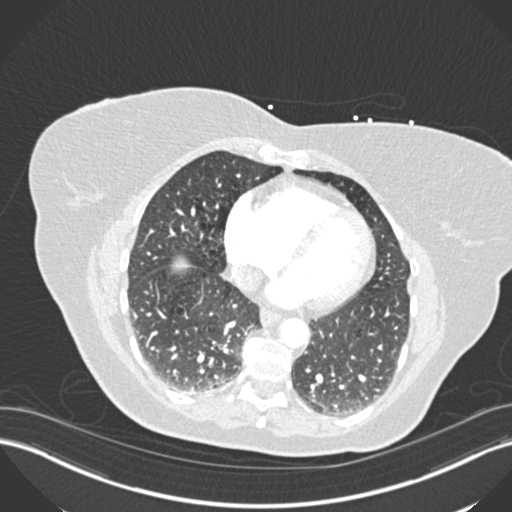
[im 100/249  mediastinal]
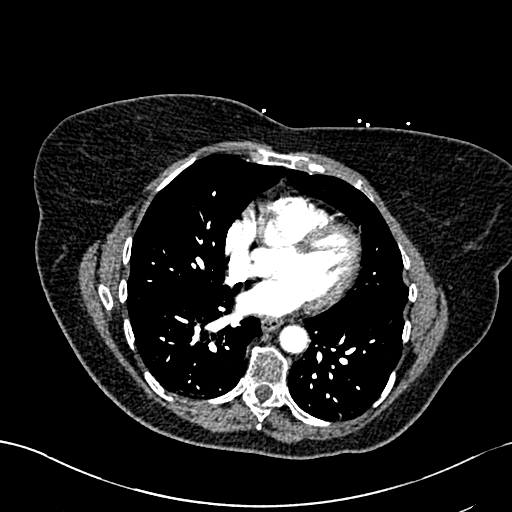
[im 116/249  lung]
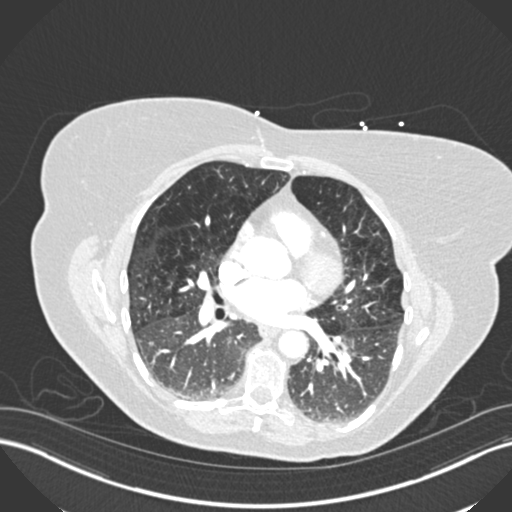
[im 133/249  mediastinal]
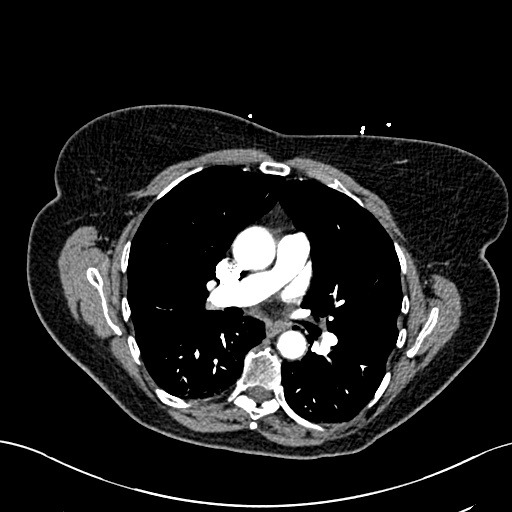
[im 149/249  lung]
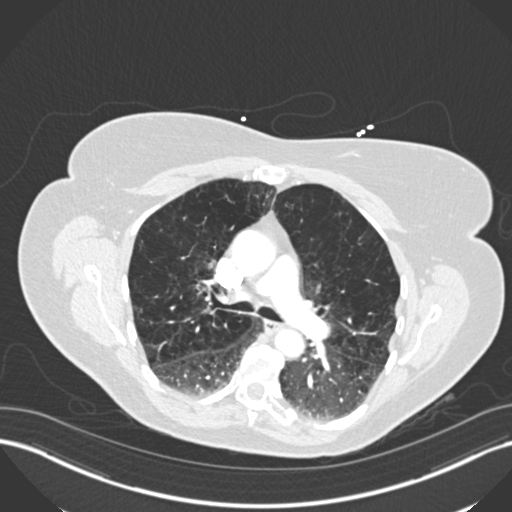
[im 166/249  mediastinal]
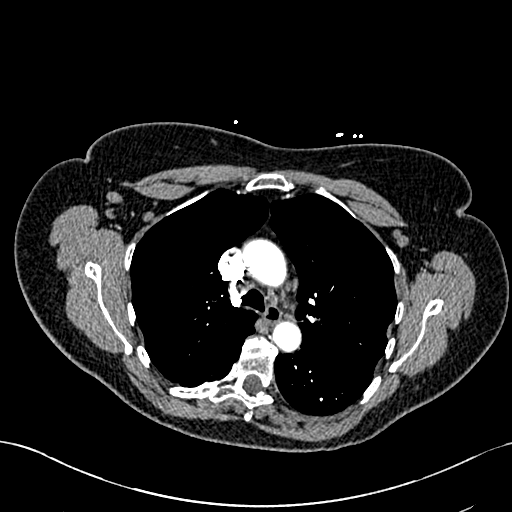
[im 182/249  lung]
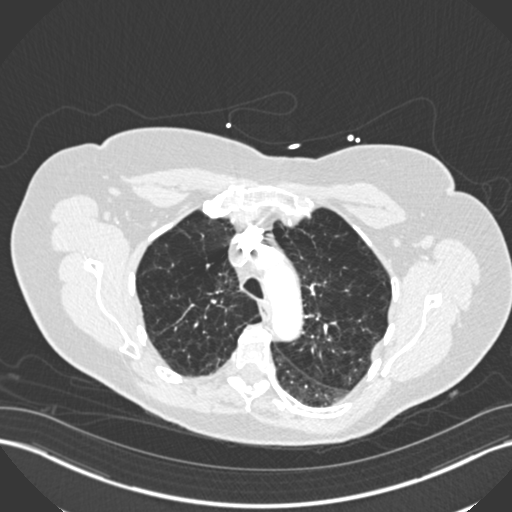
[im 199/249  mediastinal]
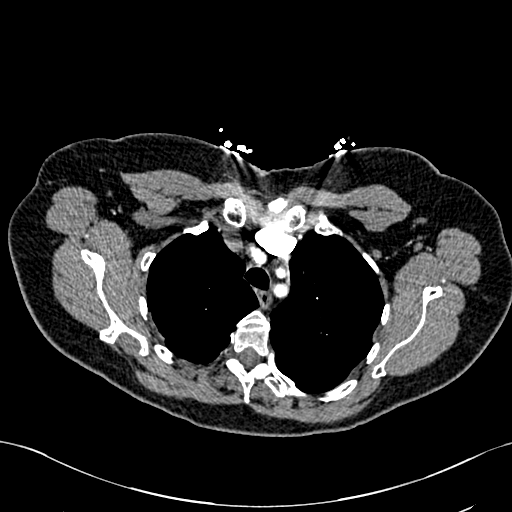
[im 215/249  lung]
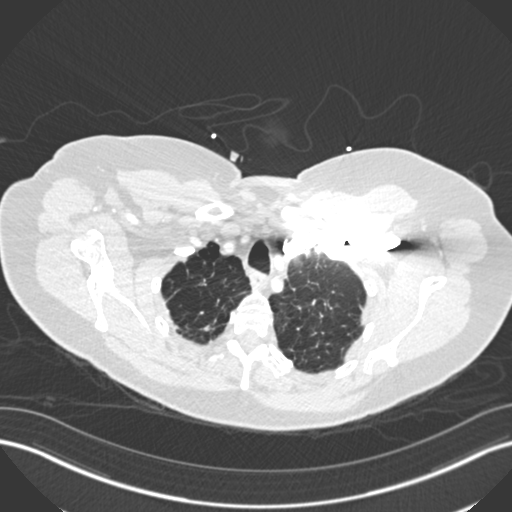
[im 232/249  mediastinal]
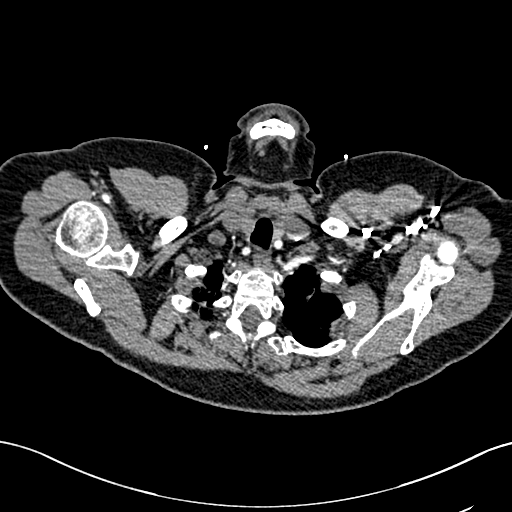

[Series 6: pe lung · axial · 0.80mm/px · z∈[+1010,+1116]mm · 3 of 71 slices shown]
[im 18/71  mediastinal]
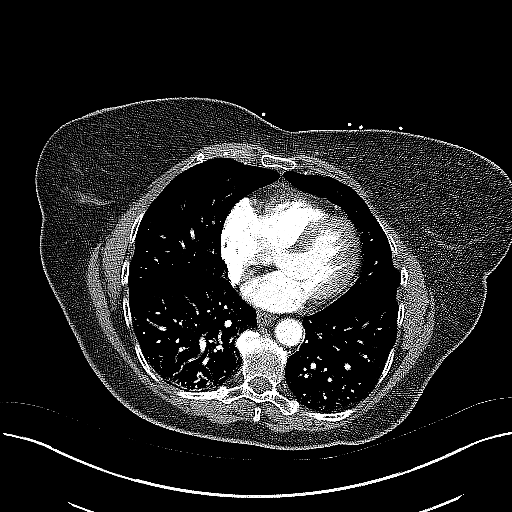
[im 36/71  mediastinal]
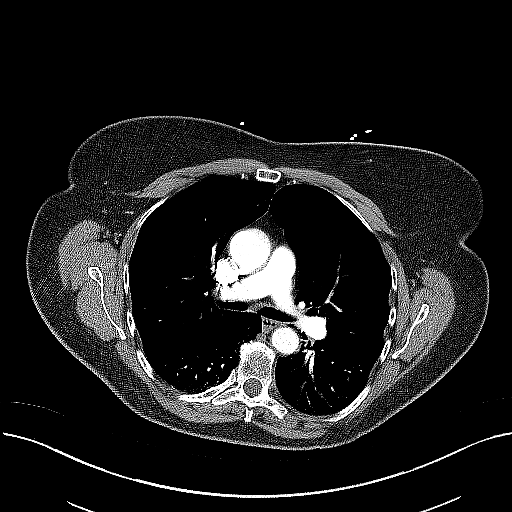
[im 53/71  mediastinal]
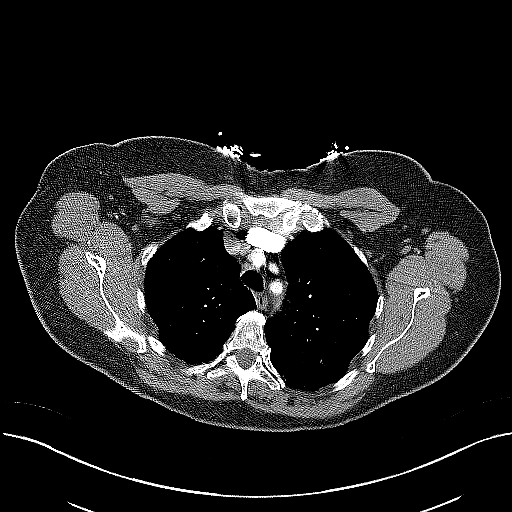

[Series 7: pe coronal mpr · coronal · 0.53mm/px · 1 of 144 slices shown]
[im 72/144  mediastinal]
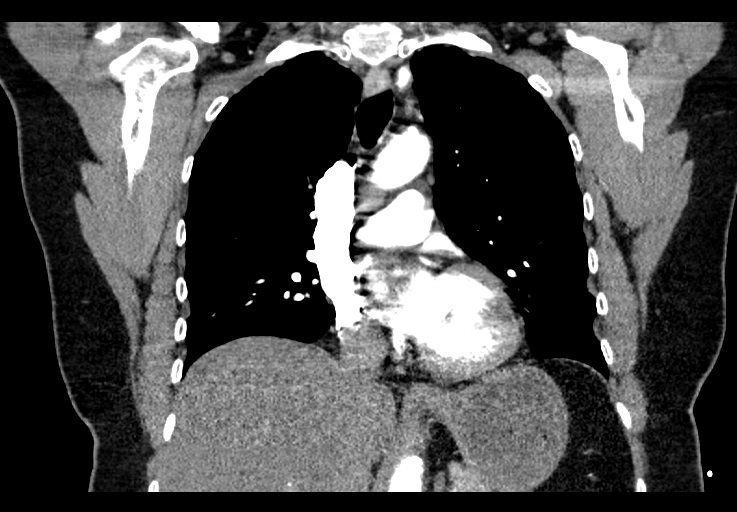

[18 of 36 positions shown; findings below may reference images not displayed]

FINDINGS: Cardiovascular: Satisfactory opacification of the pulmonary arteries
to the segmental level. No evidence of pulmonary embolism. Normal
caliber pulmonary arteries. Normal right to left ventricular ratio.
Normal heart size. No pericardial effusion.

Mediastinum/Nodes: No enlarged mediastinal, hilar, or axillary lymph
nodes. Thyroid gland, trachea, and esophagus demonstrate no
significant findings.

Lungs/Pleura: Severe upper lobe predominant centrilobular emphysema.
No focal consolidations, pleural effusion, or pneumothorax. No
suspicious pulmonary nodules.

Upper Abdomen: Decreased attenuation of the hepatic parenchyma. The
remaining visualized upper abdomen is within normal limits.

Musculoskeletal: No acute osseous abnormality. No aggressive
appearing osseous lesion.

Review of the MIP images confirms the above findings.
IMPRESSION: Vascular:

No evidence of pulmonary embolism.

Non-Vascular:

Similar appearing severe upper lobe predominant centrilobular
emphysema (emphysema ([id number]). No acute intrathoracic
abnormality.

## 2021-12-14 NOTE — Telephone Encounter (Signed)
Spoke with patient regarding results and recommendation.  Patient is unable to tolerate a statin. She is only taking her repatha once a month but will start doing this every 14 days.   Patient verbalizes understanding. Advised patient to call back with any issues or concerns.

## 2021-12-14 NOTE — Telephone Encounter (Signed)
-----   Message from Jenean Lindau, MD sent at 12/13/2021  9:46 AM EST ----- Lipid elevated atorva 10 and ll in one month. Talk to pcp about other abnormal labs. Liver mildly elevated. Jenean Lindau, MD 12/13/2021 9:43 AM

## 2021-12-15 ENCOUNTER — Other Ambulatory Visit: Payer: Self-pay | Admitting: Family Medicine

## 2021-12-15 ENCOUNTER — Encounter: Payer: Self-pay | Admitting: Family Medicine

## 2021-12-15 ENCOUNTER — Ambulatory Visit (INDEPENDENT_AMBULATORY_CARE_PROVIDER_SITE_OTHER): Payer: Medicare Other | Admitting: Family Medicine

## 2021-12-15 VITALS — BP 137/87 | HR 72 | Temp 98.4°F | Ht 63.6 in | Wt 156.8 lb

## 2021-12-15 DIAGNOSIS — E1159 Type 2 diabetes mellitus with other circulatory complications: Secondary | ICD-10-CM | POA: Diagnosis not present

## 2021-12-15 DIAGNOSIS — M47816 Spondylosis without myelopathy or radiculopathy, lumbar region: Secondary | ICD-10-CM

## 2021-12-15 DIAGNOSIS — E559 Vitamin D deficiency, unspecified: Secondary | ICD-10-CM

## 2021-12-15 DIAGNOSIS — E1169 Type 2 diabetes mellitus with other specified complication: Secondary | ICD-10-CM

## 2021-12-15 DIAGNOSIS — E039 Hypothyroidism, unspecified: Secondary | ICD-10-CM

## 2021-12-15 DIAGNOSIS — E785 Hyperlipidemia, unspecified: Secondary | ICD-10-CM

## 2021-12-15 DIAGNOSIS — M8589 Other specified disorders of bone density and structure, multiple sites: Secondary | ICD-10-CM

## 2021-12-15 DIAGNOSIS — I152 Hypertension secondary to endocrine disorders: Secondary | ICD-10-CM

## 2021-12-15 HISTORY — DX: Other specified disorders of bone density and structure, multiple sites: M85.89

## 2021-12-15 HISTORY — DX: Spondylosis without myelopathy or radiculopathy, lumbar region: M47.816

## 2021-12-15 HISTORY — DX: Vitamin D deficiency, unspecified: E55.9

## 2021-12-15 MED ORDER — VITAMIN D (ERGOCALCIFEROL) 1.25 MG (50000 UNIT) PO CAPS: 50000.0000 [IU] | ORAL_CAPSULE | ORAL | 3 refills | Status: DC

## 2021-12-15 NOTE — Progress Notes (Signed)
Subjective: CC: Chronic follow-up PCP: [name], DO LKT:GYBWL Stacy [name] is Stacy 66 y.o. female presenting to clinic today for:  1. Type 2 Diabetes with hypertension, hyperlipidemia:  Diet-controlled diabetes.  Compliant with medications for her cholesterol and blood pressure.  She unfortunately was found to have an LDL that was above 115 recently.  She has started Repatha twice monthly in efforts to reduce her cholesterol.  Zetia was considered but she wanted to try with the [place] first.  She is not on Ozempic right now but she did see that her A1c rose to 6.6 on last laboratory draw by her specialist.  She worries about her sugar.  Last eye exam: Up-to-date Last foot exam: Up-to-date Last A1c:  Lab Results  Component Value Date   HGBA1C 6.6 (H) 12/10/2021   Nephropathy screen indicated?:  On ARB Last flu, zoster and/or pneumovax: Pneumococcal vaccination due Immunization History  Administered Date(s) Administered   H1N1 10/06/2008   Influenza Inj Mdck Quad With Preservative 08/06/2018   Influenza Split 08/25/2017, 08/06/2018, 07/23/2019   Influenza Whole 08/01/2007, 08/04/2009   Influenza,inj,Quad PF,6+ Mos 07/14/2016, 08/25/2017, 08/02/2021   Influenza-Unspecified 08/06/2018, 08/24/2020   Moderna Sars-Covid-2 Vaccination 03/31/2021, 09/07/2021   PFIZER(Purple Top)SARS-COV-2 Vaccination 01/08/2020, 01/29/2020, 08/24/2020   Pneumococcal Conjugate-13 06/08/2018   Pneumococcal Polysaccharide-23 08/01/2007   Td 08/01/2007   Tdap 01/23/2018    ROS: Denies dizziness, chest pain.  She suffers from chronic shortness of breath and was recently transitioned over to [place].  She had intolerance to Trelegy  2.  Chronic back pain Patient had MRI recently which showed severe facet arthropathy in her back.  She is seeing Stacy specialist for this and has had Stacy corticosteroid injection so far but will be undergoing 2 other shots and possibly Stacy nerve ablation soon.  Her next follow-up  will be at the end of February.  She does note that she did respond well to the corticosteroid injection and is optimistic about further treatment.  Additionally, osteopenia of the lumbar spine was appreciated on recent DEXA scan with T score of -1.4.  She is not currently taking any vitamin D or calcium supplementation but can start.  Vitamin D was noted to be insufficient at 29.  Her exercise is certainly limited at this time to her back pain.    ROS: Per HPI  Allergies  Allergen Reactions   Avelox [Moxifloxacin Hcl In Nacl] Hives   Ciprofloxacin     REACTION: Rash   Livalo [Pitavastatin]    Metformin And Related     kidney   Moxifloxacin     REACTION: RASH   Bactrim [Sulfamethoxazole-Trimethoprim] Rash   Statins Other (See Comments)    myalgias   Sulfamethoxazole-Trimethoprim Rash    REACTION: Rash   Past Medical History:  Diagnosis Date   Acute bronchitis with COPD ([place]) 01/18/2010   Qualifier: Diagnosis of  By: Valetta Close DO, Karen     Allergic rhinitis 02/25/2008   Qualifier: Diagnosis of  By: Esmeralda Arthur     Angina pectoris ([place]) 06/09/2021   Aortic atherosclerosis ([place]) 08/14/2017   Appreciated on CXR 08/14/2017   Asthma    Asthmatic bronchitis 12/07/2021   Atrophy of vagina 12/07/2021   Centrilobular emphysema ([place]) 05/28/2018   Controlled diabetes mellitus type 2 with complications ([place]) 8/93/7342   Has FLD.   COPD with asthma ([place]) 11/02/2007   Qualifier: Diagnosis of  By: Valetta Close DO, Karen     Coronary artery disease    Drug-induced myopathy  04/21/2020   Dysphagia 12/07/2021   Dyspnea on exertion 08/21/2018   Essential hypertension 12/07/2021   Family history of osteoporosis 04/22/2019   Family history of rheumatoid arthritis 06/05/2019   FATTY LIVER DISEASE 08/06/2008   Qualifier: Diagnosis of  By: Valetta Close DO, Karen     Fatty liver disease, nonalcoholic    Ganglion of hand 12/07/2021   GERD 11/02/2007   Qualifier: Diagnosis of  By: Valetta Close DO, Karen     Hemorrhoids 12/07/2021    Hyperglycemia 12/07/2021   Hyperlipidemia associated with type 2 diabetes mellitus ([place]) 03/24/2020   Hypersomnia with sleep apnea 05/28/2018   HYPERTENSION, BENIGN 12/24/2008   Qualifier: Diagnosis of  By: Stanford Breed, MD, Kandyce Rud    Hypothyroidism 06/13/2007   ? Hashimotos.  Has strong family hx autoimmune d/o No h/o thyroidectomy or radioablation.    IBS (irritable bowel syndrome)    Lateral epicondylitis of left elbow 06/05/2019   Lung disease, bullous ([place]) 08/14/2017   Menopause 12/07/2021   Mixed hyperlipidemia 09/19/2019   Formatting of this note might be different from the original. LDL goal 70 ( DM2, HTN, aortic atherosclerosis)   Obesity (BMI 30.0-34.9) 05/28/2018   Overactive bladder 06/09/2017   Overweight with body mass index (BMI) of 28 to 28.9 in adult 04/22/2019   Pain in female genitalia on intercourse 12/07/2021   Perimenopausal    PERIMENOPAUSAL SYNDROME 05/13/2009   Qualifier: Diagnosis of  By: Valetta Close DO, Karen     Polyp of colon 12/07/2021   Post-menopausal 04/22/2019   Precordial pain    Sees Dr Stanford Breed.  Last cardiac cath 2016 = negative. Strong family h/o MI and CAD   Psychophysiological insomnia 05/28/2018   Pure hypercholesterolemia 12/24/2008   Formatting of this note might be different from the original. Overview:  Qualifier: Diagnosis of  By: Stanford Breed, MD, FACC, [place]:  Management per primary care.   Reactive depression 06/26/2018   Rosacea 08/01/2007   Qualifier: Diagnosis of  By: Valetta Close DO, Karen     Snoring 05/28/2018   Stage 2 moderate COPD by GOLD classification ([place]) 11/02/2007   Formatting of this note might be different from the original. PFT 05/28/2019           ratio 50% FEV1 53% FVC 83%    DLCO 54% PFT 09/25/18 ([facility]) ratio 52% FEV1 56% FVC 108%; DLCO 69%   Statin intolerance 04/10/2019   Stress incontinence in female 06/09/2017   Unspecified abnormal finding in specimens from other organs, systems and tissues 12/07/2021   UTI'S,  RECURRENT 06/13/2007   Qualifier: Diagnosis of  By: Esmeralda Arthur      Current Outpatient Medications:    albuterol (PROVENTIL) (2.5 MG/3ML) 0.083% nebulizer solution, Take 3 mLs (2.5 mg total) by nebulization every 6 (six) hours as needed for wheezing or shortness of breath., Disp: 75 mL, Rfl: 0   albuterol (VENTOLIN HFA) 108 (90 Base) MCG/ACT inhaler, Inhale 2 puffs into the lungs every 6 (six) hours as needed for wheezing or shortness of breath., Disp: 18 g, Rfl: 11   Budeson-Glycopyrrol-Formoterol (BREZTRI AEROSPHERE) 160-9-4.8 MCG/ACT AERO, Inhale 2 puffs into the lungs in the morning and at bedtime., Disp: 10.7 g, Rfl: 5   Evolocumab (REPATHA) 140 MG/ML SOSY, Inject 140 mg into the skin every 14 (fourteen) days., Disp: 2 mL, Rfl: 12   FLUoxetine (PROZAC) 20 MG capsule, Take 1 capsule (20 mg total) by mouth daily., Disp: 90 capsule, Rfl: 2   levothyroxine (SYNTHROID)  75 MCG tablet, Take 1 tablet (75 mcg total) by mouth daily., Disp: 90 tablet, Rfl: 0   loratadine (CLARITIN) 10 MG tablet, Take 10 mg by mouth daily., Disp: , Rfl:    losartan (COZAAR) 25 MG tablet, TAKE 1 TABLET BY MOUTH ONCE DAILY, Disp: 90 tablet, Rfl: 0   methocarbamol (ROBAXIN) 500 MG tablet, Take 1 tablet (500 mg total) by mouth 2 (two) times daily., Disp: 20 tablet, Rfl: 0   omeprazole (PRILOSEC) 20 MG capsule, Take 20 mg by mouth daily., Disp: , Rfl:    Semaglutide,0.25 or 0.5MG/DOS, (OZEMPIC, 0.25 OR 0.5 MG/DOSE,) 2 MG/1.5ML SOPN, Inject 0.25 mg into the skin once Stacy week. (Patient not taking: Reported on 12/15/2021), Disp: , Rfl:  Social History   Socioeconomic History   Marital status: Married    Spouse name: Not on file   Number of children: 2   Years of education: Not on file   Highest education level: Associate degree: academic program  Occupational History   Occupation: Works in Science writer   Occupation: Disabled  Tobacco Use   Smoking status: Former    Packs/day: 0.50    Years: 30.00    Pack years: 15.00     Types: Cigarettes    Quit date: 10/25/2007    Years since quitting: 14.1   Smokeless tobacco: Never  Vaping Use   Vaping Use: Never used  Substance and Sexual Activity   Alcohol use: Yes    Alcohol/week: 1.0 standard drink    Types: 1 Glasses of wine per week    Comment: Occasional-once Stacy year   Drug use: No   Sexual activity: Yes    Birth control/protection: Surgical  Other Topics Concern   Not on file  Social History Narrative   Remarried.    Previous occupation: Customer service manager.       Moved from [facility] in [place]   2 children, has stepchildren and grandchildren   Does not exercise   Social Determinants of Health   Financial Resource Strain: Not on file  Food Insecurity: Not on file  Transportation Needs: Not on file  Physical Activity: Not on file  Stress: Not on file  Social Connections: Not on file  Intimate Partner Violence: Not on file   Family History  Problem Relation Age of Onset   Hypertension Father    Hyperlipidemia Father    Heart disease Father        Died at age 42 of MI   Hypothyroidism Father    Diabetes Maternal Grandmother    Raynaud syndrome Sister    Rheum arthritis Sister    Thyroid disease Sister    CAD Brother        has 4 stents   Rheum arthritis Brother    Pulmonary fibrosis Brother    Heart disease Sister 18       h/o MI '@age'  42   Thyroid disease Sister    Heart disease Mother    Hypothyroidism Mother    Macular degeneration Mother    Breast cancer Neg Hx    Ovarian cancer Neg Hx    Prostate cancer Neg Hx    Colon cancer Neg Hx    Stomach cancer Neg Hx    Esophageal cancer Neg Hx     Objective: Office vital signs reviewed. BP 137/87    Pulse 72    Temp 98.4 F (36.9 C)    Ht 5' 3.6" (1.615 m)    Wt 156 lb 12.8 oz (71.1 kg)  SpO2 96%    BMI 27.25 kg/m   Physical Examination:  General: Awake, alert, well nourished, well-appearing female, No acute distress HEENT: No exophthalmos or goiter Cardio: regular rate and rhythm, S1S2 heard, no  murmurs appreciated Pulm: Decreased breath sounds at the apexes bilaterally with moderate air movement in the lower lungs.  Normal work of breathing on room air.  No wheezes, rhonchi or rales Extremities: warm, well perfused, No edema, cyanosis or clubbing; +2 pulses bilaterally MSK: Ambulating independently   Assessment/ Plan: 66 y.o. female   Vitamin D insufficiency - Plan: Vitamin D, Ergocalciferol, (DRISDOL) 1.25 MG (50000 UNIT) CAPS capsule  Osteopenia of multiple sites - Plan: Vitamin D, Ergocalciferol, (DRISDOL) 1.25 MG (50000 UNIT) CAPS capsule  Controlled type 2 diabetes mellitus with other specified complication, without long-term current use of insulin (HCC) - Plan: Bayer DCA Hb A1c Waived  Hyperlipidemia associated with type 2 diabetes mellitus ([place]) - Plan: Lipid panel, CMP14+EGFR  Hypertension associated with diabetes ([place]) - Plan: CMP14+EGFR  Acquired hypothyroidism - Plan: TSH, T4, free  Facet arthropathy, lumbar  I reviewed her labs, imaging studies.  We will start vitamin D prescription for her osteopenia and vitamin D insufficiency.  We discussed incorporating vitamin D and calcium rich foods as well as weightbearing exercise to promote bone health.  Like to repeat her DEXA scan in 2 years  Despite sugar having risen, her A1c still within acceptable range and considered diet controlled.  No need to resume Ozempic for now.  I placed Stacy future order for A1c recheck in 3 months  Would like to get her cholesterol rechecked during that time as well.  Plan to [place] lab results to her cardiologist once available.  Considering Zetia if Repatha is insufficient  Plan to check her TSH and free T4 during that lab draw as well  Sounds like she has Stacy solid plan in place for her lumbar issues.  Awaiting her results but I am very optimistic based on her improvement with corticosteroid injection so far  No orders of the defined types were placed in this encounter.  No orders of the  defined types were placed in this encounter.    [name], DO [place] [phone]

## 2021-12-21 ENCOUNTER — Other Ambulatory Visit: Payer: Self-pay | Admitting: Family Medicine

## 2021-12-21 DIAGNOSIS — I10 Essential (primary) hypertension: Secondary | ICD-10-CM

## 2021-12-24 ENCOUNTER — Encounter: Payer: Self-pay | Admitting: Emergency Medicine

## 2021-12-24 NOTE — Telephone Encounter (Signed)
Received the following message from patient:  ? ?"Hi Dr Lamonte Sakai, I have tried Trelegy and also Breztri and neither of them work as well as Youth worker.  I would like to go back to using the An[place].  I have one here already so I will not need to have one ordered for another month.  If this is ok with you I am going to start using the Anoro again today. ?  ?Thank you  ?Stacy [name] ?[phone]" ? ?Will forward to RB as a FYI.  ?

## 2021-12-27 ENCOUNTER — Ambulatory Visit (INDEPENDENT_AMBULATORY_CARE_PROVIDER_SITE_OTHER): Payer: Medicare Other

## 2021-12-27 ENCOUNTER — Other Ambulatory Visit: Payer: Self-pay

## 2021-12-27 ENCOUNTER — Other Ambulatory Visit: Payer: Self-pay | Admitting: Family Medicine

## 2021-12-27 VITALS — Wt 155.0 lb

## 2021-12-27 DIAGNOSIS — Z0000 Encounter for general adult medical examination without abnormal findings: Secondary | ICD-10-CM

## 2021-12-27 MED ORDER — OZEMPIC (0.25 OR 0.5 MG/DOSE) 2 MG/1.5ML ~~LOC~~ SOPN: 0.2500 mg | PEN_INJECTOR | SUBCUTANEOUS | 2 refills | Status: DC

## 2021-12-27 NOTE — Patient Instructions (Signed)
Stacy [name] , Thank you for taking time to come for your Medicare Wellness Visit. I appreciate your ongoing commitment to your health goals. Please review the following plan we discussed and let me know if I can assist you in the future.   Screening recommendations/referrals: Colonoscopy: Done 09/04/2018 - Repeat in 5 years *next year Mammogram: Done 03/10/2021 - patient chooses to do this every 2 years Bone Density: Done 04/23/2019 - Repeat every 2 years *due - consider getting at your next visit Recommended yearly ophthalmology/optometry visit for glaucoma screening and checkup Recommended yearly dental visit for hygiene and checkup  Vaccinations: Influenza vaccine: Done 08/02/2021 - Repeat annually  Pneumococcal vaccine: Done 08/01/2007 & 06/08/2018 - consider [id number] Tdap vaccine: Done 01/23/2018 - Repeat in 10 years Shingles vaccine: Due - recommend 2 doses 2-6 months apart   Covid-19:Done 12/1719, 01/29/20, 08/24/2020, 03/31/21, & 09/07/2021  Advanced directives: Please bring a copy of your health care power of attorney and living will to the office to be added to your chart at your convenience.   Conditions/risks identified: Aim for 30 minutes of exercise or brisk walking, 6-8 glasses of water, and 5 servings of fruits and vegetables each day.   Next appointment: Follow up in one year for your annual wellness visit    Preventive Care 65 Years and Older, Female Preventive care refers to lifestyle choices and visits with your health care provider that can promote health and wellness. What does preventive care include? A yearly physical exam. This is also called an annual well check. Dental exams once or twice a year. Routine eye exams. Ask your health care provider how often you should have your eyes checked. Personal lifestyle choices, including: Daily care of your teeth and gums. Regular physical activity. Eating a healthy diet. Avoiding tobacco and drug use. Limiting alcohol  use. Practicing safe sex. Taking low-dose aspirin every day. Taking vitamin and mineral supplements as recommended by your health care provider. What happens during an annual well check? The services and screenings done by your health care provider during your annual well check will depend on your age, overall health, lifestyle risk factors, and family history of disease. Counseling  Your health care provider may ask you questions about your: Alcohol use. Tobacco use. Drug use. Emotional well-being. Home and relationship well-being. Sexual activity. Eating habits. History of falls. Memory and ability to understand (cognition). Work and work Statistician. Reproductive health. Screening  You may have the following tests or measurements: Height, weight, and BMI. Blood pressure. Lipid and cholesterol levels. These may be checked every 5 years, or more frequently if you are over 17 years old. Skin check. Lung cancer screening. You may have this screening every year starting at age 18 if you have a 30-pack-year history of smoking and currently smoke or have quit within the past 15 years. Fecal occult blood test (FOBT) of the stool. You may have this test every year starting at age 102. Flexible sigmoidoscopy or colonoscopy. You may have a sigmoidoscopy every 5 years or a colonoscopy every 10 years starting at age 33. Hepatitis C blood test. Hepatitis B blood test. Sexually transmitted disease (STD) testing. Diabetes screening. This is done by checking your blood sugar (glucose) after you have not eaten for a while (fasting). You may have this done every 1-3 years. Bone density scan. This is done to screen for osteoporosis. You may have this done starting at age 101. Mammogram. This may be done every 1-2 years. Talk to your health  care provider about how often you should have regular mammograms. Talk with your health care provider about your test results, treatment options, and if necessary,  the need for more tests. Vaccines  Your health care provider may recommend certain vaccines, such as: Influenza vaccine. This is recommended every year. Tetanus, diphtheria, and acellular pertussis (Tdap, Td) vaccine. You may need a Td booster every 10 years. Zoster vaccine. You may need this after age 44. Pneumococcal 13-valent conjugate (PCV13) vaccine. One dose is recommended after age 49. Pneumococcal polysaccharide (PPSV23) vaccine. One dose is recommended after age 27. Talk to your health care provider about which screenings and vaccines you need and how often you need them. This information is not intended to replace advice given to you by your health care provider. Make sure you discuss any questions you have with your health care provider. Document Released: 11/06/2015 Document Revised: 06/29/2016 Document Reviewed: 08/11/2015 Elsevier Interactive Patient Education  2017 [place] Prevention in the Home Falls can cause injuries. They can happen to people of all ages. There are many things you can do to make your home safe and to help prevent falls. What can I do on the outside of my home? Regularly fix the edges of walkways and driveways and fix any cracks. Remove anything that might make you trip as you walk through a door, such as a raised step or threshold. Trim any bushes or trees on the path to your home. Use bright outdoor lighting. Clear any walking paths of anything that might make someone trip, such as rocks or tools. Regularly check to see if handrails are loose or broken. Make sure that both sides of any steps have handrails. Any raised decks and porches should have guardrails on the edges. Have any leaves, snow, or ice cleared regularly. Use sand or salt on walking paths during winter. Clean up any spills in your garage right away. This includes oil or grease spills. What can I do in the bathroom? Use night lights. Install grab bars by the toilet and in the  tub and shower. Do not use towel bars as grab bars. Use non-skid mats or decals in the tub or shower. If you need to sit down in the shower, use a plastic, non-slip stool. Keep the floor dry. Clean up any water that spills on the floor as soon as it happens. Remove soap buildup in the tub or shower regularly. Attach bath mats securely with double-sided non-slip rug tape. Do not have throw rugs and other things on the floor that can make you trip. What can I do in the bedroom? Use night lights. Make sure that you have a light by your bed that is easy to reach. Do not use any sheets or blankets that are too big for your bed. They should not hang down onto the floor. Have a firm chair that has side arms. You can use this for support while you get dressed. Do not have throw rugs and other things on the floor that can make you trip. What can I do in the kitchen? Clean up any spills right away. Avoid walking on wet floors. Keep items that you use a lot in easy-to-reach places. If you need to reach something above you, use a strong step stool that has a grab bar. Keep electrical cords out of the way. Do not use floor polish or wax that makes floors slippery. If you must use wax, use non-skid floor wax. Do not have throw  rugs and other things on the floor that can make you trip. What can I do with my stairs? Do not leave any items on the stairs. Make sure that there are handrails on both sides of the stairs and use them. Fix handrails that are broken or loose. Make sure that handrails are as long as the stairways. Check any carpeting to make sure that it is firmly attached to the stairs. Fix any carpet that is loose or worn. Avoid having throw rugs at the top or bottom of the stairs. If you do have throw rugs, attach them to the floor with carpet tape. Make sure that you have a light switch at the top of the stairs and the bottom of the stairs. If you do not have them, ask someone to add them for  you. What else can I do to help prevent falls? Wear shoes that: Do not have high heels. Have rubber bottoms. Are comfortable and fit you well. Are closed at the toe. Do not wear sandals. If you use a stepladder: Make sure that it is fully opened. Do not climb a closed stepladder. Make sure that both sides of the stepladder are locked into place. Ask someone to hold it for you, if possible. Clearly mark and make sure that you can see: Any grab bars or handrails. First and last steps. Where the edge of each step is. Use tools that help you move around (mobility aids) if they are needed. These include: Canes. Walkers. Scooters. Crutches. Turn on the lights when you go into a dark area. Replace any light bulbs as soon as they burn out. Set up your furniture so you have a clear path. Avoid moving your furniture around. If any of your floors are uneven, fix them. If there are any pets around you, be aware of where they are. Review your medicines with your doctor. Some medicines can make you feel dizzy. This can increase your chance of falling. Ask your doctor what other things that you can do to help prevent falls. This information is not intended to replace advice given to you by your health care provider. Make sure you discuss any questions you have with your health care provider. Document Released: 08/06/2009 Document Revised: 03/17/2016 Document Reviewed: 11/14/2014 Elsevier Interactive Patient Education  2017 [employer].

## 2021-12-27 NOTE — Telephone Encounter (Signed)
Pt aware.

## 2021-12-27 NOTE — Telephone Encounter (Signed)
Rx sent 

## 2021-12-27 NOTE — Progress Notes (Signed)
Subjective:   Stacy [name] is a 66 y.o. female who presents for an Initial Medicare Annual Wellness Visit.  Virtual Visit via Telephone Note  I connected with  Stacy [name] on 12/27/21 at  9:45 AM EST by telephone and verified that I am speaking with the correct person using two identifiers.  Location: Patient: Home Provider: [facility] Persons participating in the virtual visit: patient/Nurse Health Advisor   I discussed the limitations, risks, security and privacy concerns of performing an evaluation and management service by telephone and the availability of in person appointments. The patient expressed understanding and agreed to proceed.  Interactive audio and video telecommunications were attempted between this nurse and patient, however failed, due to patient having technical difficulties OR patient did not have access to video capability.  We continued and completed visit with audio only.  Some vital signs may be absent or patient reported.   Clayden Withem E Iyan Flett, LPN r  Review of Systems     Cardiac Risk Factors include: sedentary lifestyle;family history of premature cardiovascular disease;diabetes mellitus;dyslipidemia;hypertension;Other (see comment), Risk factor comments: atherosclerosis, COPD, fatty liver, CAD     Objective:    Today's Vitals   12/27/21 0951  Weight: 155 lb (70.3 kg)   Body mass index is 26.94 kg/m.  Advanced Directives 12/27/2021 08/04/2021 03/15/2021 12/24/2020 09/19/2020 06/12/2015  Does Patient Have a Medical Advance Directive? No No No No No No  Would patient like information on creating a medical advance directive? No - Patient declined - - Yes (MAU/Ambulatory/Procedural Areas - Information given) No - Patient declined Yes - Educational materials given    Current Medications (verified) Outpatient Encounter Medications as of 12/27/2021  Medication Sig   albuterol (PROVENTIL) (2.5 MG/3ML) 0.083% nebulizer solution Take 3 mLs (2.5 mg total) by nebulization  every 6 (six) hours as needed for wheezing or shortness of breath.   albuterol (VENTOLIN HFA) 108 (90 Base) MCG/ACT inhaler Inhale 2 puffs into the lungs every 6 (six) hours as needed for wheezing or shortness of breath.   Evolocumab (REPATHA) 140 MG/ML SOSY Inject 140 mg into the skin every 14 (fourteen) days.   FLUoxetine (PROZAC) 20 MG capsule TAKE 1 CAPSULE BY MOUTH  DAILY   levothyroxine (SYNTHROID) 75 MCG tablet Take 1 tablet (75 mcg total) by mouth daily.   loratadine (CLARITIN) 10 MG tablet Take 10 mg by mouth daily.   losartan (COZAAR) 25 MG tablet TAKE 1 TABLET BY MOUTH ONCE  DAILY   omeprazole (PRILOSEC) 20 MG capsule Take 20 mg by mouth daily.   umeclidinium-vilanterol (ANORO ELLIPTA) 62.5-25 MCG/ACT AEPB Inhale 1 puff into the lungs daily.   Vitamin D, Ergocalciferol, (DRISDOL) 1.25 MG (50000 UNIT) CAPS capsule Take 1 capsule (50,000 Units total) by mouth every 7 (seven) days.   Budeson-Glycopyrrol-Formoterol (BREZTRI AEROSPHERE) 160-9-4.8 MCG/ACT AERO Inhale 2 puffs into the lungs in the morning and at bedtime. (Patient not taking: Reported on 12/27/2021)   methocarbamol (ROBAXIN) 500 MG tablet Take 1 tablet (500 mg total) by mouth 2 (two) times daily. (Patient not taking: Reported on 12/27/2021)   Semaglutide,0.25 or 0.'5MG'$ /DOS, (OZEMPIC, 0.25 OR 0.5 MG/DOSE,) 2 MG/1.5ML SOPN Inject 0.25 mg into the skin once a week. (Patient not taking: Reported on 12/15/2021)   No facility-administered encounter medications on file as of 12/27/2021.    Allergies (verified) Avelox [moxifloxacin hcl in nacl], Ciprofloxacin, Livalo [pitavastatin], Metformin and related, Moxifloxacin, Bactrim [sulfamethoxazole-trimethoprim], Statins, and Sulfamethoxazole-trimethoprim   History: Past Medical History:  Diagnosis Date   Acute  bronchitis with COPD ([place]) 01/18/2010   Qualifier: Diagnosis of  By: Valetta Close DO, Karen     Allergic rhinitis 02/25/2008   Qualifier: Diagnosis of  By: Valetta Close DO, Karen     Angina pectoris  ([place]) 06/09/2021   Aortic atherosclerosis ([place]) 08/14/2017   Appreciated on CXR 08/14/2017   Asthma    Asthmatic bronchitis 12/07/2021   Atrophy of vagina 12/07/2021   Centrilobular emphysema ([place]) 05/28/2018   Controlled diabetes mellitus type 2 with complications ([place]) 3/53/6144   Has FLD.   COPD with asthma ([place]) 11/02/2007   Qualifier: Diagnosis of  By: Valetta Close DO, Karen     Coronary artery disease    Drug-induced myopathy 04/21/2020   Dysphagia 12/07/2021   Dyspnea on exertion 08/21/2018   Essential hypertension 12/07/2021   Family history of osteoporosis 04/22/2019   Family history of rheumatoid arthritis 06/05/2019   FATTY LIVER DISEASE 08/06/2008   Qualifier: Diagnosis of  By: Valetta Close DO, Karen     Fatty liver disease, nonalcoholic    Ganglion of hand 12/07/2021   GERD 11/02/2007   Qualifier: Diagnosis of  By: Valetta Close DO, Karen     Hemorrhoids 12/07/2021   Hyperglycemia 12/07/2021   Hyperlipidemia associated with type 2 diabetes mellitus ([place]) 03/24/2020   Hypersomnia with sleep apnea 05/28/2018   HYPERTENSION, BENIGN 12/24/2008   Qualifier: Diagnosis of  By: Stanford Breed, MD, Kandyce Rud    Hypothyroidism 06/13/2007   ? Hashimotos.  Has strong family hx autoimmune d/o No h/o thyroidectomy or radioablation.    IBS (irritable bowel syndrome)    Lateral epicondylitis of left elbow 06/05/2019   Lung disease, bullous ([place]) 08/14/2017   Menopause 12/07/2021   Mixed hyperlipidemia 09/19/2019   Formatting of this note might be different from the original. LDL goal 70 ( DM2, HTN, aortic atherosclerosis)   Obesity (BMI 30.0-34.9) 05/28/2018   Overactive bladder 06/09/2017   Overweight with body mass index (BMI) of 28 to 28.9 in adult 04/22/2019   Pain in female genitalia on intercourse 12/07/2021   Perimenopausal    PERIMENOPAUSAL SYNDROME 05/13/2009   Qualifier: Diagnosis of  By: Valetta Close DO, Karen     Polyp of colon 12/07/2021   Post-menopausal 04/22/2019   Precordial pain    Sees Dr Stanford Breed.  Last  cardiac cath 2016 = negative. Strong family h/o MI and CAD   Psychophysiological insomnia 05/28/2018   Pure hypercholesterolemia 12/24/2008   Formatting of this note might be different from the original. Overview:  Qualifier: Diagnosis of  By: Stanford Breed, MD, FACC, [place]:  Management per primary care.   Reactive depression 06/26/2018   Rosacea 08/01/2007   Qualifier: Diagnosis of  By: Valetta Close DO, Karen     Snoring 05/28/2018   Stage 2 moderate COPD by GOLD classification ([place]) 11/02/2007   Formatting of this note might be different from the original. PFT 05/28/2019           ratio 50% FEV1 53% FVC 83%    DLCO 54% PFT 09/25/18 ([facility]) ratio 52% FEV1 56% FVC 108%; DLCO 69%   Statin intolerance 04/10/2019   Stress incontinence in female 06/09/2017   Unspecified abnormal finding in specimens from other organs, systems and tissues 12/07/2021   UTI'S, RECURRENT 06/13/2007   Qualifier: Diagnosis of  By: Esmeralda Arthur     Past Surgical History:  Procedure Laterality Date   ABLATION     CARDIAC CATHETERIZATION N/A 06/12/2015   Procedure: Left Heart Cath and Coronary Angiography;  Surgeon: Burnell Blanks, MD;  Location: [facility];  Service: Cardiovascular;  Laterality: N/A;   CESAREAN SECTION     LTCS     x2   SPIROMETRY  01/05/2007   FVC 68% predicted; FEV1 44% predicted; FEV1/FVC 63% predicted = moderate obstruction with low vital capacity possibly due to restriction   TUBAL LIGATION     Family History  Problem Relation Age of Onset   Hypertension Father    Hyperlipidemia Father    Heart disease Father        Died at age 80 of MI   Hypothyroidism Father    Diabetes Maternal Grandmother    Raynaud syndrome Sister    Rheum arthritis Sister    Thyroid disease Sister    CAD Brother        has 4 stents   Rheum arthritis Brother    Pulmonary fibrosis Brother    Heart disease Sister 57       h/o MI '@age'$  38   Thyroid disease Sister    Heart disease Mother     Hypothyroidism Mother    Macular degeneration Mother    Breast cancer Neg Hx    Ovarian cancer Neg Hx    Prostate cancer Neg Hx    Colon cancer Neg Hx    Stomach cancer Neg Hx    Esophageal cancer Neg Hx    Social History   Socioeconomic History   Marital status: Married    Spouse name: Not on file   Number of children: 2   Years of education: Not on file   Highest education level: Associate degree: academic program  Occupational History   Occupation: Works in Science writer   Occupation: Disabled  Tobacco Use   Smoking status: Former    Packs/day: 0.50    Years: 30.00    Pack years: 15.00    Types: Cigarettes    Quit date: 10/25/2007    Years since quitting: 14.1   Smokeless tobacco: Never  Vaping Use   Vaping Use: Never used  Substance and Sexual Activity   Alcohol use: Yes    Alcohol/week: 1.0 standard drink    Types: 1 Glasses of wine per week    Comment: Occasional-once a year   Drug use: No   Sexual activity: Yes    Birth control/protection: Surgical  Other Topics Concern   Not on file  Social History Narrative   Remarried.    Previous occupation: Customer service manager.       M[place] from [facility] [place]   2 children, has stepchildren and grandchildren   Does not exercise   Social Determinants of Health   Financial Resource Strain: Low Risk    Difficulty of Paying Living Expenses: Not hard at all  Food Insecurity: No Food Insecurity   Worried About Charity fundraiser in the Last Year: Never true   Arboriculturist in the Last Year: Never true  Transportation Needs: No Transportation Needs   Lack of Transportation (Medical): No   Lack of Transportation (Non-Medical): No  Physical Activity: Insufficiently Active   Days of Exercise per Week: 3 days   Minutes of Exercise per Session: 30 min  Stress: No Stress Concern Present   Feeling of Stress : Not at all  Social Connections: Socially Integrated   Frequency of Communication with Friends and Family: More than three times a week    Frequency of Social Gatherings with Friends and Family: More than three times a week  Attends Religious Services: More than 4 times per year   Active Member of Clubs or Organizations: Yes   Attends Archivist Meetings: More than 4 times per year   Marital Status: Married    Tobacco Counseling Counseling given: Not Answered   Clinical Intake:  Pre-visit preparation completed: Yes  Pain : No/denies pain     BMI - recorded: 26.94 Nutritional Status: BMI 25 -29 Overweight Nutritional Risks: None Diabetes: Yes CBG done?: No Did pt. bring in CBG monitor from home?: No  How often do you need to have someone help you when you read instructions, pamphlets, or other written materials from your doctor or pharmacy?: 1 - Never  Diabetic? Nutrition Risk Assessment:  Has the patient had any N/V/D within the last 2 months?  No  Does the patient have any non-healing wounds?  No  Has the patient had any unintentional weight loss or weight gain?  No   Diabetes:  Is the patient diabetic?  Yes  If diabetic, was a CBG obtained today?  No  Did the patient bring in their glucometer from home?  No  How often do you monitor your CBG's? Twice per week.   Financial Strains and Diabetes Management:  Are you having any financial strains with the device, your supplies or your medication? No .  Does the patient want to be seen by Chronic Care Management for management of their diabetes?  No  Would the patient like to be referred to a Nutritionist or for Diabetic Management?  No   Diabetic Exams:  Diabetic Eye Exam: Completed 07/07/2021.   Diabetic Foot Exam: Completed 03/10/2021. Pt has been advised about the importance in completing this exam. Pt is scheduled for diabetic foot exam on next appt.    Interpreter Needed?: No  Information entered by :: Delrose Rohwer, LPN   Activities of Daily Living In your present state of health, do you have any difficulty performing the following  activities: 12/27/2021  Hearing? Y  Vision? N  Difficulty concentrating or making decisions? N  Walking or climbing stairs? N  Dressing or bathing? N  Doing errands, shopping? N  Preparing Food and eating ? N  Using the Toilet? N  In the past six months, have you accidently leaked urine? Y  Comment wears pads for protection  Do you have problems with loss of bowel control? N  Managing your Medications? N  Managing your Finances? N  Housekeeping or managing your Housekeeping? N  Some recent data might be hidden    Patient Care Team: [name], DO as PCP - General (Family Medicine) Lavera Guise, [facility], [facility] (Ophthalmology) Revankar, Reita Cliche, MD as Consulting Physician (Cardiology) Collene Gobble, MD as Consulting Physician (Pulmonary Disease)  Indicate any recent Medical Services you may have received from other than [facility] providers in the past year (date may be approximate).     Assessment:   This is a routine wellness examination for [place].  Hearing/Vision screen Hearing Screening - Comments:: C/o mild hearing difficulties  - declines hearing aids at this time Vision Screening - Comments:: Wears rx glasses - up to date with routine eye exams with Ernestine Mcmurray at [facility] issues and exercise activities discussed: Current Exercise Habits: Home exercise routine, Type of exercise: walking;stretching, Time (Minutes): 30, Frequency (Times/Week): 3, Weekly Exercise (Minutes/Week): 90, Intensity: Mild, Exercise limited by: respiratory conditions(s);orthopedic condition(s)   Goals Addressed  This Visit's Progress    AWV   On track    12/24/2020 AWV Goal: Exercise for General Health  Patient will verbalize understanding of the benefits of increased physical activity: Exercising regularly is important. It will improve your overall fitness, flexibility, and endurance. Regular exercise also will improve your overall  health. It can help you control your weight, reduce stress, and improve your bone density. Over the next year, patient will increase physical activity as tolerated with a goal of at least 150 minutes of moderate physical activity per week.  You can tell that you are exercising at a moderate intensity if your heart starts beating faster and you start breathing faster but can still hold a conversation. Moderate-intensity exercise ideas include: Walking 1 mile (1.6 km) in about 15 minutes Biking Hiking Golfing Dancing Water aerobics Patient will verbalize understanding of everyday activities that increase physical activity by providing examples like the following: Yard work, such as: Sales promotion account executive Gardening Washing windows or floors Patient will be able to explain general safety guidelines for exercising:  Before you start a new exercise program, talk with your health care provider. Do not exercise so much that you hurt yourself, feel dizzy, or get very short of breath. Wear comfortable clothes and wear shoes with good support. Drink plenty of water while you exercise to prevent dehydration or heat stroke. Work out until your breathing and your heartbeat get faster.        Depression Screen PHQ 2/9 Scores 12/27/2021 12/15/2021 08/06/2021 03/10/2021 12/29/2020 12/24/2020 12/01/2020  PHQ - 2 Score 0 0 1 0 0 0 0  PHQ- 9 Score '4 4 5 5 '$ 0 - -    Fall Risk Fall Risk  12/27/2021 12/15/2021 08/06/2021 03/10/2021 12/24/2020  Falls in the past year? 0 0 0 0 0  Number falls in past yr: 0 - - - -  Injury with Fall? 0 - - - -  Risk for fall due to : Orthopedic patient - - - -  Follow up Falls prevention discussed - - - -    FALL [place]:  Any stairs in or around the home? Yes  If so, are there any without handrails? No  Home free of loose throw rugs in walkways, pet beds, electrical cords,  etc? Yes  Adequate lighting in your home to reduce risk of falls? Yes   ASSISTIVE DEVICES UTILIZED TO PREVENT FALLS:  Life alert? No  Use of a cane, walker or w/c? No  Grab bars in the bathroom? Yes  Shower chair or bench in shower? No  Elevated toilet seat or a handicapped toilet? Yes   TIMED UP AND GO:  Was the test performed? No . Telephonic visit   Cognitive Function: Normal cognitive status assessed by direct observation by this Nurse Health Advisor. No abnormalities found.       6CIT Screen 12/24/2020  What Year? 0 points  What month? 0 points  What time? 0 points  Count back from 20 0 points  Months in reverse 0 points  Repeat phrase 0 points  Total Score 0    Immunizations Immunization History  Administered Date(s) Administered   H1N1 10/06/2008   Influenza Inj Mdck Quad With Preservative 08/06/2018   Influenza Split 08/25/2017, 08/06/2018, 07/23/2019   Influenza Whole 08/01/2007, 08/04/2009   Influenza,inj,Quad PF,6+ Mos 07/14/2016, 08/25/2017, 08/02/2021   Influenza-Unspecified 08/06/2018, 08/24/2020   Moderna  Sars-Covid-2 Vaccination 03/31/2021, 09/07/2021   PFIZER(Purple Top)SARS-COV-2 Vaccination 01/08/2020, 01/29/2020, 08/24/2020   Pneumococcal Conjugate-13 06/08/2018   Pneumococcal Polysaccharide-23 08/01/2007   Td 08/01/2007   Tdap 01/23/2018    TDAP status: Up to date  Flu Vaccine status: Up to date  Pneumococcal vaccine status: Up to date  Covid-19 vaccine status: Completed vaccines  Qualifies for Shingles Vaccine? Yes   Zostavax completed No   Shingrix Completed?: No.    Education has been provided regarding the importance of this vaccine. Patient has been advised to call insurance company to determine out of pocket expense if they have not yet received this vaccine. Advised may also receive vaccine at local pharmacy or Health Dept. Verbalized acceptance and understanding.  Screening Tests Health Maintenance  Topic Date Due   Zoster  Vaccines- Shingrix (1 of 2) Never done   DEXA SCAN  04/22/2021   COVID-19 Vaccine (6 - Booster for Pfizer series) 12/31/2021 (Originally 11/02/2021)   HIV Screening  03/10/2022 (Originally 03/17/1971)   FOOT EXAM  03/10/2022   HEMOGLOBIN A1C  06/09/2022   OPHTHALMOLOGY EXAM  07/07/2022   MAMMOGRAM  03/04/2023   COLONOSCOPY (Pts 45-66yr Insurance coverage will need to be confirmed)  09/05/2023   PAP SMEAR-Modifier  12/25/2023   TETANUS/TDAP  01/24/2028   INFLUENZA VACCINE  Completed   Hepatitis C Screening  Completed   HPV VACCINES  Aged Out    Health Maintenance  Health Maintenance Due  Topic Date Due   Zoster Vaccines- Shingrix (1 of 2) Never done   DEXA SCAN  04/22/2021    Colorectal cancer screening: Type of screening: Colonoscopy. Completed 09/04/2018. Repeat every 5 years  Mammogram status: Completed 03/10/2021. Repeat every year  Bone Density status: Completed 04/23/2019. Results reflect: Bone density results: OSTEOPENIA. Repeat every 2 years.  Lung Cancer Screening: (Low Dose CT Chest recommended if Age 64-80years, 30 pack-year currently smoking OR have quit w/in 15years.) does not qualify.   Additional Screening:  Hepatitis C Screening: does qualify; Completed 09/28/2016  Vision Screening: Recommended annual ophthalmology exams for early detection of glaucoma and other disorders of the eye. Is the patient up to date with their annual eye exam?  Yes  Who is the provider or what is the name of the office in which the patient attends annual eye exams[facility] [place] pt is not established with a provider, would they like to be referred to a provider to establish care? No .   Dental Screening: Recommended annual dental exams for proper oral hygiene  Community Resource Referral / Chronic Care Management: CRR required this visit?  No   CCM required this visit?  No      Plan:     I have personally reviewed and noted the following in the patients chart:    Medical and social history Use of alcohol, tobacco or illicit drugs  Current medications and supplements including opioid prescriptions. Patient is not currently taking opioid prescriptions. Functional ability and status Nutritional status Physical activity Advanced directives List of other physicians Hospitalizations, surgeries, and ER visits in previous 12 months Vitals Screenings to include cognitive, depression, and falls Referrals and appointments  In addition, I have reviewed and discussed with patient certain preventive protocols, quality metrics, and best practice recommendations. A written personalized care plan for preventive services as well as general preventive health recommendations were provided to patient.     ASandrea Hammond LPN   30/11/7251  Nurse Notes: None

## 2021-12-29 MED ORDER — ANORO ELLIPTA 62.5-25 MCG/ACT IN AEPB: 1.0000 | INHALATION_SPRAY | Freq: Every day | RESPIRATORY_TRACT | 3 refills | Status: DC

## 2021-12-29 NOTE — Telephone Encounter (Signed)
Yes I agree with changing back to the [place] ?

## 2022-01-18 ENCOUNTER — Ambulatory Visit (INDEPENDENT_AMBULATORY_CARE_PROVIDER_SITE_OTHER): Payer: Medicare Other | Admitting: Family

## 2022-01-18 ENCOUNTER — Encounter: Payer: Self-pay | Admitting: Family

## 2022-01-18 VITALS — BP 133/77 | HR 75 | Temp 97.8°F | Ht 63.6 in | Wt 156.8 lb

## 2022-01-18 DIAGNOSIS — R112 Nausea with vomiting, unspecified: Secondary | ICD-10-CM | POA: Diagnosis not present

## 2022-01-18 DIAGNOSIS — R1011 Right upper quadrant pain: Secondary | ICD-10-CM | POA: Diagnosis not present

## 2022-01-18 DIAGNOSIS — R197 Diarrhea, unspecified: Secondary | ICD-10-CM | POA: Diagnosis not present

## 2022-01-18 NOTE — Patient Instructions (Signed)
Cholecystitis ?Cholecystitis is inflammation of the gallbladder. Cholecystitis is often called a gallbladder attack. The gallbladder is a pear-shaped organ that lies beneath the liver on the right side of the body. The gallbladder stores a fluid that helps the body digest fats (bile). If bile builds up in your gallbladder, your gallbladder becomes inflamed and can develop a serious infection. ?This condition may occur suddenly. Cholecystitis is a serious condition and requires treatment. ?What are the causes? ?The most common cause of this condition is gallstones. Gallstones can block the tube (duct) that carries bile out of your gallbladder. This causes bile to build up. ?Other causes include: ?Damage to the gallbladder due to decreased blood flow. ?Infection in the bile duct. ?Scars, kinks, or adhesions in the bile duct. ?Tumors in the liver, pancreas, or gallbladder. ?What increases the risk? ?You are more likely to develop this condition if: ?You are female and between the ages of 35-62. ?You take birth control pills or use estrogen. ?You take certain medicines that increase your likelihood of developing gallstones. ?You are obese. ?You have a severe reaction to an infection (sepsis). The infection may be bacterial, fungal, parasitic, or viral. ?You have been hospitalized due to trauma, such as a burn or critical illness. ?You have not eaten or drank for a long period of time (prolonged fasting). ?What are the signs or symptoms? ?Symptoms of this condition include: ?Tenderness in the upper right part of the abdomen. ?A lump over the gallbladder. ?Bloating in the abdomen. ?Nausea. ?Vomiting. ?Fever. ?Chills. ?How is this diagnosed? ?This condition is diagnosed with a medical history and physical exam. You may also have other tests, including: ?Imaging tests, such as: ?An ultrasound of the abdomen. ?A CT scan of the abdomen. ?A gallbladder nuclear scan (HIDA cholescintigraphy). This allows your health care  provider to see the bile moving from your liver to your gallbladder and to your small intestine. ?MRI. ?Blood tests, such as: ?A complete blood count. The white blood cell count may be higher than normal. ?C-reactive protein (CRP) test. The level of CRP will be higher if there is an infection. ?Liver function tests. Certain types of gallstones cause some results to be higher than normal. ?How is this treated? ?Treatment may include: ?Pain medicine and IV fluids. ?Not eating or drinking (fasting). This helps to take stress off of your gallbladder. ?Antibiotic medicine. This is usually given through an IV. ?Surgery to remove your gallbladder (cholecystectomy). ?Gallbladder drainage. In this procedure, a tube is placed into the gallbladder to drain fluid. This may be done for people with moderate to severe cholecystitis who cannot have surgery. ?Follow these instructions at home: ?Medicines ? ?Take over-the-counter and prescription medicines only as told by your health care provider. ?If you were prescribed an antibiotic medicine, take it as told by your health care provider. Do not stop taking the antibiotic even if you start to feel better. ?General instructions ?Follow instructions from your health care provider about what to eat or drink. When you are allowed to eat, avoid eating or drinking anything that triggers your symptoms. ?Do not use any products that contain nicotine or tobacco. These products include cigarettes, chewing tobacco, and vaping devices, such as e-cigarettes. If you need help quitting, ask your health care provider. ?Keep all follow-up visits. This is important. ?Contact a health care provider if: ?Your pain is not controlled with medicine. ?You have a fever. ?Get help right away if: ?Your pain moves to another part of your abdomen or to your  back. ?You continue to have symptoms or you develop new symptoms even with treatment. ?These symptoms may be an emergency. Get help right away. Call  911. ?Do not wait to see if the symptoms will go away. ?Do not drive yourself to the hospital. ?Summary ?Cholecystitis is inflammation of the gallbladder. ?The most common cause of this condition is gallstones. Gallstones can block the tube (duct) that carries bile out of your gallbladder. ?Common symptoms include tenderness in the abdomen, nausea, vomiting, fever, and chills. ?This condition is treated with fasting, pain medicine, surgery to remove the gallbladder, antibiotic medicines, and gallbladder drainage. ?Follow your health care provider's instructions for eating and drinking. Avoid eating anything that triggers your symptoms. ?This information is not intended to replace advice given to you by your health care provider. Make sure you discuss any questions you have with your health care provider. ?Document Revised: 04/13/2021 Document Reviewed: 04/13/2021 ?Elsevier Patient Education ? [place]. ? ?

## 2022-01-18 NOTE — Progress Notes (Signed)
? ?Subjective:  ? ? Patient ID: Stacy [name], female    DOB: [birth year], 66 y.o.   MRN: [id number] ? ?Chief Complaint  ?Patient presents with  ? Diarrhea  ? Back Pain  ? Gas  ? Nausea  ? ?PT presents to the office today with back pain, vomiting, and diarrhea that started a week ago after getting the shingles vaccine. States these symptoms improved, but three days ago symptoms restarted and worsening.  ? ?States the pain is worsen when she eats. She will have abdomina pain that radiates to her back.  ? ?She reports she has had C dif in the past, but states this does not feel the same.  ?Diarrhea  ?This is a new problem. The current episode started 1 to 4 weeks ago. The problem occurs more than 10 times per day. The problem has been waxing and waning. The stool consistency is described as Watery. The patient states that diarrhea awakens her from sleep. Associated symptoms include abdominal pain, bloating, chills and vomiting. Pertinent negatives include no fever, headaches or myalgias. She has tried anti-motility drug and bismuth subsalicylate for the symptoms. The treatment provided mild relief.  ?Back Pain ?Associated symptoms include abdominal pain. Pertinent negatives include no fever or headaches.  ?Abdominal Pain ?This is a new problem. The current episode started in the past 7 days. The pain is located in the RUQ. The pain is at a severity of 8/10 (at times). The quality of the pain is cramping. Associated symptoms include belching, diarrhea and vomiting. Pertinent negatives include no constipation, fever, frequency, headaches, hematochezia, hematuria, melena or myalgias. The pain is aggravated by eating.  ? ? ? ?Review of Systems  ?Constitutional:  Positive for chills. Negative for fever.  ?Gastrointestinal:  Positive for abdominal pain, bloating, diarrhea and vomiting. Negative for constipation, hematochezia and melena.  ?Genitourinary:  Negative for frequency and hematuria.  ?Musculoskeletal:  Positive for  back pain. Negative for myalgias.  ?Neurological:  Negative for headaches.  ?All other systems reviewed and are negative. ? ?   ?Objective:  ? Physical Exam ?Vitals reviewed.  ?Constitutional:   ?   General: She is not in acute distress. ?   Appearance: She is well-developed. She is obese.  ?HENT:  ?   Head: Normocephalic and atraumatic.  ?   Right Ear: Tympanic membrane normal.  ?   Left Ear: Tympanic membrane normal.  ?Eyes:  ?   Pupils: Pupils are equal, round, and reactive to light.  ?Neck:  ?   Thyroid: No thyromegaly.  ?Cardiovascular:  ?   Rate and Rhythm: Normal rate and regular rhythm.  ?   Heart sounds: Normal heart sounds. No murmur heard. ?Pulmonary:  ?   Effort: Pulmonary effort is normal. No respiratory distress.  ?   Breath sounds: Normal breath sounds. No wheezing.  ?Abdominal:  ?   General: Bowel sounds are normal. There is no distension.  ?   Palpations: Abdomen is soft.  ?   Tenderness: There is abdominal tenderness (RUQ).  ?Musculoskeletal:     ?   General: No tenderness. Normal range of motion.  ?   Cervical back: Normal range of motion and neck supple.  ?Skin: ?   General: Skin is warm and dry.  ?Neurological:  ?   Mental Status: She is alert and oriented to person, place, and time.  ?   Cranial Nerves: No cranial nerve deficit.  ?   Deep Tendon Reflexes: Reflexes are normal and symmetric.  ?Psychiatric:     ?  Behavior: Behavior normal.     ?   Thought Content: Thought content normal.     ?   Judgment: Judgment normal.  ? ? ? ?BP 133/77   Pulse 75   Temp 97.8 ?F (36.6 ?C) (Temporal)   Ht 5' 3.6" (1.615 m)   Wt 156 lb 12.8 oz (71.1 kg)   BMI 27.25 kg/m?  ? ? ?   ?Assessment & Plan:  ?KAEDYNCE TAPP comes in today with chief complaint of Diarrhea, Back Pain, Gas, and Nausea ? ? ?Diagnosis and orders addressed: ? ?1. Diarrhea, unspecified type ?- CMP14+EGFR ?- CBC with Differential/Platelet ?- US Abdomen Complete; Future ? ?2. Nausea and vomiting, unspecified vomiting type ?- CMP14+EGFR ?-  CBC with Differential/Platelet ?- US Abdomen Complete; Future ? ?3. Right upper quadrant abdominal pain ?- CMP14+EGFR ?- CBC with Differential/Platelet ?- US Abdomen Complete; Future ? ? ?Labs pending ?[place] pending ?NPO until [place] ?If [place] negative will need to stool panel  ? ? ?Evelina Dun, FNP ? ? ? ?

## 2022-01-19 ENCOUNTER — Ambulatory Visit: Payer: Medicare Other | Admitting: Family Medicine

## 2022-01-19 ENCOUNTER — Ambulatory Visit (HOSPITAL_COMMUNITY)
Admission: RE | Admit: 2022-01-19 | Discharge: 2022-01-19 | Disposition: A | Discharge status: Home / Self Care | Payer: Medicare Other | Source: Ambulatory Visit | Attending: Family | Admitting: Family

## 2022-01-19 ENCOUNTER — Other Ambulatory Visit: Payer: Self-pay

## 2022-01-19 DIAGNOSIS — R112 Nausea with vomiting, unspecified: Secondary | ICD-10-CM | POA: Diagnosis present

## 2022-01-19 DIAGNOSIS — R197 Diarrhea, unspecified: Secondary | ICD-10-CM | POA: Insufficient documentation

## 2022-01-19 DIAGNOSIS — R1011 Right upper quadrant pain: Secondary | ICD-10-CM | POA: Insufficient documentation

## 2022-01-19 LAB — CBC WITH DIFFERENTIAL/PLATELET
Basophils Absolute: 0 10*3/uL (ref 0.0–0.2)
Basos: 0 %
EOS (ABSOLUTE): 0.3 10*3/uL (ref 0.0–0.4)
Eos: 2 %
Hematocrit: 40.8 % (ref 34.0–46.6)
Hemoglobin: 13.6 g/dL (ref 11.1–15.9)
Immature Grans (Abs): 0 10*3/uL (ref 0.0–0.1)
Immature Granulocytes: 0 %
Lymphocytes Absolute: 2.3 10*3/uL (ref 0.7–3.1)
Lymphs: 20 %
MCH: 32 pg (ref 26.6–33.0)
MCHC: 33.3 g/dL (ref 31.5–35.7)
MCV: 96 fL (ref 79–97)
Monocytes Absolute: 1 10*3/uL — ABNORMAL HIGH (ref 0.1–0.9)
Monocytes: 9 %
Neutrophils Absolute: 7.6 10*3/uL — ABNORMAL HIGH (ref 1.4–7.0)
Neutrophils: 69 %
Platelets: 276 10*3/uL (ref 150–450)
RBC: 4.25 x10E6/uL (ref 3.77–5.28)
RDW: 12.6 % (ref 11.7–15.4)
WBC: 11.2 10*3/uL — ABNORMAL HIGH (ref 3.4–10.8)

## 2022-01-19 LAB — CMP14+EGFR
ALT: 25 IU/L (ref 0–32)
AST: 18 IU/L (ref 0–40)
Albumin/Globulin Ratio: 2.1 (ref 1.2–2.2)
Albumin: 4.2 g/dL (ref 3.8–4.8)
Alkaline Phosphatase: 77 IU/L (ref 44–121)
BUN/Creatinine Ratio: 10 — ABNORMAL LOW (ref 12–28)
BUN: 8 mg/dL (ref 8–27)
Bilirubin Total: 0.4 mg/dL (ref 0.0–1.2)
CO2: 22 mmol/L (ref 20–29)
Calcium: 9.2 mg/dL (ref 8.7–10.3)
Chloride: 106 mmol/L (ref 96–106)
Creatinine, Ser: 0.79 mg/dL (ref 0.57–1.00)
Globulin, Total: 2 g/dL (ref 1.5–4.5)
Glucose: 84 mg/dL (ref 70–99)
Potassium: 4.1 mmol/L (ref 3.5–5.2)
Sodium: 141 mmol/L (ref 134–144)
Total Protein: 6.2 g/dL (ref 6.0–8.5)
eGFR: 84 mL/min/{1.73_m2} (ref >59)

## 2022-01-20 ENCOUNTER — Other Ambulatory Visit: Payer: Self-pay | Admitting: Family Medicine

## 2022-01-20 NOTE — Addendum Note (Signed)
Addended by: Evelina Dun A on: 01/20/2022 02:21 PM ? ? Modules accepted: Orders ? ?

## 2022-01-24 ENCOUNTER — Other Ambulatory Visit: Payer: Self-pay | Admitting: Family Medicine

## 2022-01-24 ENCOUNTER — Other Ambulatory Visit: Payer: Medicare Other

## 2022-01-24 DIAGNOSIS — R197 Diarrhea, unspecified: Secondary | ICD-10-CM

## 2022-01-27 ENCOUNTER — Other Ambulatory Visit: Payer: Self-pay | Admitting: Family

## 2022-01-27 MED ORDER — VANCOMYCIN 50 MG/ML ORAL SOLUTION: 125.0000 mg | Freq: Four times a day (QID) | ORAL | 0 refills | Status: DC

## 2022-01-28 LAB — CDIFF NAA+O+P+STOOL CULTURE
E coli, Shiga toxin Assay: NEGATIVE
Toxigenic C. Difficile by PCR: POSITIVE — AB

## 2022-01-31 ENCOUNTER — Other Ambulatory Visit: Payer: Self-pay | Admitting: Nurse Practitioner

## 2022-01-31 MED ORDER — VANCOMYCIN HCL 125 MG PO CAPS: 125.0000 mg | ORAL_CAPSULE | Freq: Four times a day (QID) | ORAL | 0 refills | Status: AC

## 2022-01-31 NOTE — Telephone Encounter (Signed)
Capsules sent to pharmacy, dosage /frequency/route remains the same as original prescription order. ?

## 2022-02-28 ENCOUNTER — Encounter: Payer: Self-pay | Admitting: Family

## 2022-02-28 ENCOUNTER — Ambulatory Visit (INDEPENDENT_AMBULATORY_CARE_PROVIDER_SITE_OTHER): Payer: Medicare Other | Admitting: Family

## 2022-02-28 DIAGNOSIS — Z8619 Personal history of other infectious and parasitic diseases: Secondary | ICD-10-CM | POA: Diagnosis not present

## 2022-02-28 DIAGNOSIS — A0472 Enterocolitis due to Clostridium difficile, not specified as recurrent: Secondary | ICD-10-CM | POA: Diagnosis not present

## 2022-02-28 DIAGNOSIS — R109 Unspecified abdominal pain: Secondary | ICD-10-CM | POA: Diagnosis not present

## 2022-02-28 MED ORDER — VANCOMYCIN HCL 125 MG PO CAPS: ORAL_CAPSULE | ORAL | 0 refills | Status: DC

## 2022-02-28 NOTE — Progress Notes (Signed)
? ?Virtual Visit  Note ?Due to COVID-19 pandemic this visit was conducted virtually. This visit type was conducted due to national recommendations for restrictions regarding the COVID-19 Pandemic (e.g. social distancing, sheltering in place) in an effort to limit this patient's exposure and mitigate transmission in our community. All issues noted in this document were discussed and addressed.  A physical exam was not performed with this format. ? ?I connected with Stacy [name] on 02/28/22 at 3:01 Pm by telephone and verified that I am speaking with the correct person using two identifiers. Stacy [name] is currently located at home and no one is currently with her during visit. The provider, Evelina Dun, FNP is located in their office at time of visit. ? ?I discussed the limitations, risks, security and privacy concerns of performing an evaluation and management service by telephone and the availability of in person appointments. I also discussed with the patient that there may be a patient responsible charge related to this service. The patient expressed understanding and agreed to proceed. ? ?Stacy [name],you are scheduled for a virtual visit with your provider today.   ? ?Just as we do with appointments in the office, we must obtain your consent to participate.  Your consent will be active for this visit and any virtual visit you may have with one of our providers in the next 365 days.   ? ?If you have a MyChart account, I can also send a copy of this consent to you electronically.  All virtual visits are billed to your insurance company just like a traditional visit in the office.  As this is a virtual visit, video technology does not allow for your provider to perform a traditional examination.  This may limit your provider's ability to fully assess your condition.  If your provider identifies any concerns that need to be evaluated in person or the need to arrange testing such as labs, EKG, etc, we will make  arrangements to do so.   ? ?Although advances in technology are sophisticated, we cannot ensure that it will always work on either your end or our end.  If the connection with a video visit is poor, we may have to switch to a telephone visit.  With either a video or telephone visit, we are not always able to ensure that we have a secure connection.   I need to obtain your verbal consent now.   Are you willing to proceed with your visit today?  ? ?Stacy [name] has provided verbal consent on 02/28/2022 for a virtual visit (video or telephone). ? ? ?Evelina Dun, FNP ?02/28/2022  3:02 PM ? ? ? ?History and Present Illness: ? ?Pt calls the office today with abdominal pain and extreme stool odor. She reports very large BM x4 a day. She has had C Diff twice and states her symptoms are very similar.  She completed vancomycin 02/08/22 and felt good for three days. Then her symptoms returned.  ? ?She saw GI for EGD. She did not talk about her C diff at that time. Denies any blood or fevers.   ?Abdominal Pain ?This is a new problem. The current episode started 1 to 4 weeks ago. The pain is located in the generalized abdominal region.  ? ? ? ?Review of Systems  ?Gastrointestinal:  Positive for abdominal pain.  ? ? ?Observations/Objective: ?No SOB or distress noted  ? ?Assessment and Plan: ?1. Abdominal pain, unspecified abdominal location ?- vancomycin (VANCOCIN) 125 MG capsule; 125  mg PO q 6 hours for 14 days, then 125 mg  q 12 hours for 7 days, then 125 mg PO qday for 7 days, then 125 mg PO q 3 days for 5 weeks.  Dispense: 87 capsule; Refill: 0 ? ?2. H/O Clostridium difficile infection ?- vancomycin (VANCOCIN) 125 MG capsule; 125 mg PO q 6 hours for 14 days, then 125 mg  q 12 hours for 7 days, then 125 mg PO qday for 7 days, then 125 mg PO q 3 days for 5 weeks.  Dispense: 87 capsule; Refill: 0 ? ?3. C. difficile colitis ?- vancomycin (VANCOCIN) 125 MG capsule; 125 mg PO q 6 hours for 14 days, then 125 mg  q 12 hours for 7  days, then 125 mg PO qday for 7 days, then 125 mg PO q 3 days for 5 weeks.  Dispense: 87 capsule; Refill: 0 ? ?4. C. difficile diarrhea ?- vancomycin (VANCOCIN) 125 MG capsule; 125 mg PO q 6 hours for 14 days, then 125 mg  q 12 hours for 7 days, then 125 mg PO qday for 7 days, then 125 mg PO q 3 days for 5 weeks.  Dispense: 87 capsule; Refill: 0 ? ? ?Will do longer course of Vancomycin  ?Wash hands with soap and water ?Use Colrex wipes to wipe bathroom ?Call GI and make follow up appt ?  ?I discussed the assessment and treatment plan with the patient. The patient was provided an opportunity to ask questions and all were answered. The patient agreed with the plan and demonstrated an understanding of the instructions. ?  ?The patient was advised to call back or seek an in-person evaluation if the symptoms worsen or if the condition fails to improve as anticipated. ? ?The above assessment and management plan was discussed with the patient. The patient verbalized understanding of and has agreed to the management plan. Patient is aware to call the clinic if symptoms persist or worsen. Patient is aware when to return to the clinic for a follow-up visit. Patient educated on when it is appropriate to go to the emergency department.  ? ?Time call ended:  3:16 pm  ? ?I provided 15 minutes of  non face-to-face time during this encounter. ? ? ? ?Evelina Dun, FNP ? ? ?

## 2022-02-28 NOTE — Patient Instructions (Signed)
Clostridioides Difficile Infection Clostridioides difficile infection, also known as C. difficile or C. diff infection, happens when too much C. diff bacteria grows. This can cause severe diarrhea and inflammation of the colon (colitis). It is linked to recent use of antibiotic medicine. This infection can be passed from person to person (is contagious). You also may be exposed to the bacteria from contact with food, water, or surfaces that have the bacteria on them. What are the causes? Certain bacteria live in the colon and help to digest food. This infection develops when the balance of helpful bacteria in the colon changes and the C. diff bacteria grow out of control. This is often caused by taking antibiotics. What increases the risk? You may be more likely to develop this condition if you: Take certain antibiotics that kill many types of bacteria or take antibiotics for a long time. Have an extended stay in a hospital or long-term care facility. Are older than age 65. Have had a C. diff infection before or have been exposed to C. diff bacteria. Have a weakened disease-fighting system (immune system). Take a medicine to reduce stomach acid, such as a proton pump inhibitor, for a long time. Have a serious underlying condition, such as colon cancer or inflammatory bowel disease (IBD). Have had a gastrointestinal (GI) tract procedure. What are the signs or symptoms? Symptoms of this condition include: Diarrhea (three or more times a day) for several days. Fever. Loss of appetite. Nausea. Swelling, pain, cramping, or tenderness in the abdomen. How is this diagnosed? This condition is diagnosed with: Your medical history and a physical exam. Tests, which may include: A test for C. diff in your stool (feces). Blood tests. Imaging tests, such as a CT scan of your abdomen. A procedure in which your colon is examined. This is rare. How is this treated? Treatment for this condition may  include: Stopping the antibiotics that you were taking when the C. diff infection began. Do this only as told by your health care provider. Taking certain antibiotics to stop C. diff growth. Taking stool from a healthy person and placing it into your colon (fecal transplant). This may be done if the infection keeps coming back. Having surgery to remove the infected part of the colon. This is rare. Follow these instructions at home: Medicines Take over-the-counter and prescription medicines only as told by your health care provider. Take antibiotic medicine as told by your health care provider. Do not stop taking the antibiotic even if you start to feel better. Do not treat diarrhea with medicines unless your health care provider tells you to. Eating and drinking  Follow instructions from your health care provider about eating and drinking restrictions. Eat bland foods in small amounts that are easy to digest. These include bananas, applesauce, rice, lean meats, toast, and crackers. Follow instructions on replacing body fluid that has been lost (rehydrate). This may include: Drinking clear fluids, such as water, clear fruit juice that is diluted with water, and low-calorie sports drinks. Sucking on ice chips. Taking an oral rehydration solution (ORS). This drink is sold at pharmacies and retail stores. Avoid milk, caffeine, and alcohol. Drink enough fluid to keep your urine pale yellow. General instructions Wash your hands often with soap and water for at least 20 seconds. Bathe or shower using soap and water daily. Return to your normal activities as told by your health care provider. Ask your health care provider what activities are safe for you. Be sure your home is   clean before you leave the hospital or clinic to go home. Then continue daily cleaning for at least a week. Keep all follow-up visits. This is important. How is this prevented? Hand hygiene  Wash your hands with soap and  water for at least 20 seconds before preparing food and after using the bathroom. Make sure the people you live with also wash their hands often with soap and water for at least 20 seconds. If you are being treated at a hospital or clinic, make sure that all health care providers and visitors wash their hands with soap and water before touching you. Contact precautions Tell your health care team right away if you develop diarrhea while in a hospital or long-term care facility. When visiting someone in a hospital or a long-term care facility, follow guidelines for wearing a gown, gloves, or other protective equipment. If possible, avoid contact with people who have diarrhea. Use a separate bathroom if you are sick and live with other people, if possible. Clean environment Clean surfaces that are touched often every day. C. diff bacteria are killed only by cleaning products that contain 10% chlorine bleach solution. Be sure to: Read the product's label to make sure the product will kill the bacteria on the surface you are cleaning. Clean frequently touched surfaces, such as toilet seats and flush handles, bathtubs, sinks, doorknobs, and work surfaces. If you are in the hospital, make sure that staff members clean the surfaces in your room daily. Let a staff person know right away if body fluids have splashed or spilled. Washing clothes and linens Use a powder laundry detergent containing chlorine bleach instead of liquid detergent. Powder detergents contain chlorine bleach in low levels to help kill bacteria. Run your empty washing machine on the hot setting once a month with enough detergent for a full load. This will kill any remaining C. diff bacteria. Contact a health care provider if: Your symptoms do not get better, or they get worse, even with treatment. Your symptoms go away and then come back. You have a fever. You develop new symptoms. Get help right away if: You have more pain or  tenderness in your abdomen. You have stool that is mostly bloody, or looks black and tarry. You cannot eat or drink without vomiting. You have signs of dehydration, such as: Dark urine, very little urine, or no urine. Cracked lips or dry mouth. Not making tears when you cry. Sunken eyes. Sleepiness. Weakness or dizziness. Summary Clostridioides difficile infection, or C. diff infection, can cause severe diarrhea and inflammation of the colon (colitis). It is linked to recent antibiotic use. C. diff infection can spread from person to person (is contagious). You also may be exposed to the bacteria from contact with food, water, or surfaces that have the bacteria on them. This infection may be treated by stopping the antibiotics you were using when the infection began. Fecal transplant or surgery may be needed for repeat or severe infections. Washing hands with soap and water for at least 20 seconds after you use the bathroom and before you eat, and cleaning surfaces with a 10% bleach solution, can help prevent or limit spread of this infection. This information is not intended to replace advice given to you by your health care provider. Make sure you discuss any questions you have with your health care provider. Document Revised: 01/30/2020 Document Reviewed: 01/30/2020 Elsevier Patient Education  2023 [employer]  

## 2022-03-01 ENCOUNTER — Encounter: Payer: Self-pay | Admitting: Emergency Medicine

## 2022-03-01 ENCOUNTER — Ambulatory Visit (INDEPENDENT_AMBULATORY_CARE_PROVIDER_SITE_OTHER): Payer: Medicare Other | Admitting: Emergency Medicine

## 2022-03-01 ENCOUNTER — Ambulatory Visit: Payer: Medicare Other | Admitting: Emergency Medicine

## 2022-03-01 DIAGNOSIS — J449 Chronic obstructive pulmonary disease, unspecified: Secondary | ICD-10-CM

## 2022-03-01 DIAGNOSIS — J432 Centrilobular emphysema: Secondary | ICD-10-CM

## 2022-03-01 LAB — PULMONARY FUNCTION TEST
DL/VA % pred: 90 %
DL/VA: 3.83 ml/min/mmHg/L
DLCO cor % pred: 65 %
DLCO cor: 12.7 ml/min/mmHg
DLCO unc % pred: 66 %
DLCO unc: 12.77 ml/min/mmHg
FEF 25-75 Post: 0.48 L/sec
FEF 25-75 Pre: 0.44 L/sec
FEF2575-%Change-Post: 8 %
FEF2575-%Pred-Post: 22 %
FEF2575-%Pred-Pre: 20 %
FEV1-%Change-Post: 7 %
FEV1-%Pred-Post: 50 %
FEV1-%Pred-Pre: 47 %
FEV1-Post: 1.21 L
FEV1-Pre: 1.13 L
FEV1FVC-%Change-Post: 2 %
FEV1FVC-%Pred-Pre: 64 %
FEV6-%Change-Post: 2 %
FEV6-%Pred-Post: 74 %
FEV6-%Pred-Pre: 72 %
FEV6-Post: 2.22 L
FEV6-Pre: 2.16 L
FEV6FVC-%Change-Post: -1 %
FEV6FVC-%Pred-Post: 98 %
FEV6FVC-%Pred-Pre: 99 %
FVC-%Change-Post: 4 %
FVC-%Pred-Post: 75 %
FVC-%Pred-Pre: 72 %
FVC-Post: 2.35 L
FVC-Pre: 2.25 L
Post FEV1/FVC ratio: 52 %
Post FEV6/FVC ratio: 95 %
Pre FEV1/FVC ratio: 50 %
Pre FEV6/FVC Ratio: 96 %
RV % pred: 142 %
RV: 2.85 L
TLC % pred: 110 %
TLC: 5.4 L

## 2022-03-01 NOTE — Progress Notes (Signed)
? ?Subjective:  ? ? Patient ID: Stacy [name], female    DOB: 1956-02-17, 66 y.o.   MRN: [id number] ? ?Shortness of Breath ?Pertinent negatives include no headaches, leg swelling or wheezing.  ? ?ROV 11/30/21 --  66 yo woman with a history of COPD with positive bronchodilator response.  She also has chronic cough due to this as well as allergic rhinitis and GERD.  [facility] for possible endobronchial valve placement given some focal bullous disease, this was deferred given her current functional capacity.  ?On Anoro.  Today she reports that she has had 2 flares in the last year. She has had some decreased functional capacity - but notes that she is not currently doing pulm rehab, [facility], feels that this has impacted her. She uses albuterol about once a month. She tries to avoid it. She would like to restart exercise again. She is able to walk through a store, then gets SOB.  ?Remains on loratadine.   ? ?ROV 03/01/22 --follow-up visit 66 year old woman with a history of COPD with an asthmatic component.  She is currently managed on.  She has chronic cough in the setting of her COPD as well as allergic rhinitis, GERD.  She has been having persistent dyspnea and so we repeated her pulmonary function testing today as below. ?Today she report that she feels about the same - some good and bad days with breathing limiting her.  ?Her last 6 minute walk was 3 yrs ago  ?Coughing daily, thick mucous. Using albuterol 3x a month ?She is on Anoro - coughed w breztri and trelegy - coughed with these.  ? ?PFT done today and reviewed by me show very severe obstruction with an FEV1 of 1.13 L, no significant bronchodilator response, hyperinflated lung volumes, decreased diffusion capacity that corrects to normal range when adjusted for alveolar volume.  Her FEV1 was 0.97 in March 2019, 1.06 in December 2018. ? ? ?Review of Systems  ?HENT:  Positive for congestion. Negative for postnasal drip and sinus pressure.   ?Respiratory:  Negative for  cough, chest tightness, shortness of breath and wheezing.   ?Cardiovascular:  Negative for palpitations and leg swelling.  ?Allergic/Immunologic: Negative.   ?Neurological:  Negative for headaches.  ?Psychiatric/Behavioral:  The patient is not nervous/anxious.   ? ?Past Medical History:  ?Diagnosis Date  ? Acute bronchitis with COPD ([place]) 01/18/2010  ? Qualifier: Diagnosis of  By: Esmeralda Arthur    ? Allergic rhinitis 02/25/2008  ? Qualifier: Diagnosis of  By: Esmeralda Arthur    ? Angina pectoris ([place]) 06/09/2021  ? Aortic atherosclerosis ([place]) 08/14/2017  ? Appreciated on CXR 08/14/2017  ? Asthma   ? Asthmatic bronchitis 12/07/2021  ? Atrophy of vagina 12/07/2021  ? Centrilobular emphysema ([place]) 05/28/2018  ? Controlled diabetes mellitus type 2 with complications ([place]) 11/23/8655  ? Has FLD.  ? COPD with asthma ([place]) 11/02/2007  ? Qualifier: Diagnosis of  By: Esmeralda Arthur    ? Coronary artery disease   ? Drug-induced myopathy 04/21/2020  ? Dysphagia 12/07/2021  ? Dyspnea on exertion 08/21/2018  ? Essential hypertension 12/07/2021  ? Family history of osteoporosis 04/22/2019  ? Family history of rheumatoid arthritis 06/05/2019  ? FATTY LIVER DISEASE 08/06/2008  ? Qualifier: Diagnosis of  By: Esmeralda Arthur    ? Fatty liver disease, nonalcoholic   ? Ganglion of hand 12/07/2021  ? GERD 11/02/2007  ? Qualifier: Diagnosis of  By: Esmeralda Arthur    ? Hemorrhoids 12/07/2021  ?  Hyperglycemia 12/07/2021  ? Hyperlipidemia associated with type 2 diabetes mellitus ([place]) 03/24/2020  ? Hypersomnia with sleep apnea 05/28/2018  ? HYPERTENSION, BENIGN 12/24/2008  ? Qualifier: Diagnosis of  By: Stanford Breed, MD, Kandyce Rud   ? Hypothyroidism 06/13/2007  ? ? Hashimotos.  Has strong family hx autoimmune d/o No h/o thyroidectomy or radioablation.   ? IBS (irritable bowel syndrome)   ? Lateral epicondylitis of left elbow 06/05/2019  ? Lung disease, bullous ([place]) 08/14/2017  ? Menopause 12/07/2021  ? Mixed hyperlipidemia 09/19/2019  ? Formatting of this  note might be different from the original. LDL goal 70 ( DM2, HTN, aortic atherosclerosis)  ? Obesity (BMI 30.0-34.9) 05/28/2018  ? Overactive bladder 06/09/2017  ? Overweight with body mass index (BMI) of 28 to 28.9 in adult 04/22/2019  ? Pain in female genitalia on intercourse 12/07/2021  ? Perimenopausal   ? PERIMENOPAUSAL SYNDROME 05/13/2009  ? Qualifier: Diagnosis of  By: Esmeralda Arthur    ? Polyp of colon 12/07/2021  ? Post-menopausal 04/22/2019  ? Precordial pain   ? Sees Dr Stanford Breed.  Last cardiac cath 2016 = negative. Strong family h/o MI and CAD  ? Psychophysiological insomnia 05/28/2018  ? Pure hypercholesterolemia 12/24/2008  ? Formatting of this note might be different from the original. Overview:  Qualifier: Diagnosis of  By: Stanford Breed, MD, Kandyce Rud  Last Assessment & Plan:  Management per primary care.  ? Reactive depression 06/26/2018  ? Rosacea 08/01/2007  ? Qualifier: Diagnosis of  By: Esmeralda Arthur    ? Snoring 05/28/2018  ? Stage 2 moderate COPD by GOLD classification ([place]) 11/02/2007  ? Formatting of this note might be different from the original. PFT 05/28/2019           ratio 50% FEV1 53% FVC 83%    DLCO 54% PFT 09/25/18 ([facility]) ratio 52% FEV1 56% FVC 108%; DLCO 69%  ? Statin intolerance 04/10/2019  ? Stress incontinence in female 06/09/2017  ? Unspecified abnormal finding in specimens from other organs, systems and tissues 12/07/2021  ? UTI'S, RECURRENT 06/13/2007  ? Qualifier: Diagnosis of  By: Esmeralda Arthur    ?  ? ?Family History  ?Problem Relation Age of Onset  ? Hypertension Father   ? Hyperlipidemia Father   ? Heart disease Father   ?     Died at age 33 of MI  ? Hypothyroidism Father   ? Diabetes Maternal Grandmother   ? Raynaud syndrome Sister   ? Rheum arthritis Sister   ? Thyroid disease Sister   ? CAD Brother   ?     has 4 stents  ? Rheum arthritis Brother   ? Pulmonary fibrosis Brother   ? Heart disease Sister 47  ?     h/o MI '@age'$  29  ? Thyroid disease Sister   ? Heart disease Mother   ?  Hypothyroidism Mother   ? Macular degeneration Mother   ? Breast cancer Neg Hx   ? Ovarian cancer Neg Hx   ? Prostate cancer Neg Hx   ? Colon cancer Neg Hx   ? Stomach cancer Neg Hx   ? Esophageal cancer Neg Hx   ?  ?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  ?No other occupational exposure.  ?Has lived in [place] and [place] ?No military ? ? ?Allergies  ?Allergen Reactions  ? Avelox [Moxifloxacin Hcl In Nacl] Hives  ? Ciprofloxacin   ?  REACTION: Rash  ? Livalo [Pitavastatin]   ?  Metformin And Related   ?  kidney  ? Moxifloxacin   ?  REACTION: RASH  ? Bactrim [Sulfamethoxazole-Trimethoprim] Rash  ? Statins Other (See Comments)  ?  myalgias  ? Sulfamethoxazole-Trimethoprim Rash  ?  REACTION: Rash  ?  ? ?Outpatient Medications Prior to Visit  ?Medication Sig Dispense Refill  ? albuterol (PROVENTIL) (2.5 MG/3ML) 0.083% nebulizer solution Take 3 mLs (2.5 mg total) by nebulization every 6 (six) hours as needed for wheezing or shortness of breath. 75 mL 0  ? albuterol (VENTOLIN HFA) 108 (90 Base) MCG/ACT inhaler Inhale 2 puffs into the lungs every 6 (six) hours as needed for wheezing or shortness of breath. 18 g 11  ? Evolocumab (REPATHA) 140 MG/ML SOSY Inject 140 mg into the skin every 14 (fourteen) days. 2 mL 12  ? FLUoxetine (PROZAC) 20 MG capsule TAKE 1 CAPSULE BY MOUTH  DAILY 90 capsule 3  ? levothyroxine (SYNTHROID) 75 MCG tablet TAKE 1 TABLET BY MOUTH DAILY 90 tablet 0  ? loratadine (CLARITIN) 10 MG tablet Take 10 mg by mouth daily.    ? losartan (COZAAR) 25 MG tablet TAKE 1 TABLET BY MOUTH ONCE  DAILY 90 tablet 1  ? methocarbamol (ROBAXIN) 500 MG tablet Take 1 tablet (500 mg total) by mouth 2 (two) times daily. 20 tablet 0  ? omeprazole (PRILOSEC) 20 MG capsule Take 20 mg by mouth daily.    ? Semaglutide,0.25 or 0.'5MG'$ /DOS, (OZEMPIC, 0.25 OR 0.5 MG/DOSE,) 2 MG/1.5ML SOPN Inject 0.25 mg into the skin once a week. 1.5 mL 2  ? umeclidinium-vilanterol (ANORO ELLIPTA) 62.5-25 MCG/ACT AEPB Inhale 1 puff into the lungs daily.  180 each 3  ? vancomycin (VANCOCIN) 125 MG capsule 125 mg PO q 6 hours for 14 days, then 125 mg  q 12 hours for 7 days, then 125 mg PO qday for 7 days, then 125 mg PO q 3 days for 5 weeks. 87 capsule 0  ? [facility]

## 2022-03-01 NOTE — Progress Notes (Signed)
PFT done today. 

## 2022-03-01 NOTE — Assessment & Plan Note (Signed)
Pulmonary function testing shows stable FEV1 although severely decreased.  She does have some focal bullous disease and we could consider endobronchial valves.  I think the most important maneuvers to make at this time are to rule out occult desaturation with walking oximetry.  Also get back to an exercise routine and build up her functional capacity.  She felt the best when she was exercising.  Continue her Anoro, albuterol as needed.  If she desaturates I will send her to pulmonary rehab.  If she does not desaturate I will send her back to the [facility] ?

## 2022-03-01 NOTE — Patient Instructions (Addendum)
We will perform a walking oximetry today to ensure that your oxygen does not drop with exertion ?Please continue Anoro once daily. ?Please keep your albuterol available to use 2 puffs when needed for shortness of breath, chest tightness, wheezing. ?We will get you back into an exercise routine, either at the [facility] or through pulmonary rehab depending on how your oxygen is doing. ?Follow with Dr Lamonte Sakai in 6 months or sooner if you have any problems ? ?

## 2022-03-29 ENCOUNTER — Other Ambulatory Visit: Payer: Self-pay | Admitting: Family Medicine

## 2022-03-29 DIAGNOSIS — I10 Essential (primary) hypertension: Secondary | ICD-10-CM

## 2022-04-01 ENCOUNTER — Other Ambulatory Visit: Payer: Self-pay | Admitting: Family Medicine

## 2022-04-01 DIAGNOSIS — I10 Essential (primary) hypertension: Secondary | ICD-10-CM

## 2022-04-14 ENCOUNTER — Other Ambulatory Visit: Payer: Self-pay | Admitting: Family Medicine

## 2022-04-18 ENCOUNTER — Ambulatory Visit (INDEPENDENT_AMBULATORY_CARE_PROVIDER_SITE_OTHER): Payer: Medicare Other | Admitting: Family Medicine

## 2022-04-18 ENCOUNTER — Encounter: Payer: Self-pay | Admitting: Family Medicine

## 2022-04-18 VITALS — BP 121/80 | HR 65 | Temp 98.3°F | Resp 20 | Ht 63.0 in | Wt 155.0 lb

## 2022-04-18 DIAGNOSIS — T466X5A Adverse effect of antihyperlipidemic and antiarteriosclerotic drugs, initial encounter: Secondary | ICD-10-CM | POA: Diagnosis not present

## 2022-04-18 DIAGNOSIS — K760 Fatty (change of) liver, not elsewhere classified: Secondary | ICD-10-CM | POA: Diagnosis not present

## 2022-04-18 DIAGNOSIS — M609 Myositis, unspecified: Secondary | ICD-10-CM | POA: Diagnosis not present

## 2022-04-18 DIAGNOSIS — I152 Hypertension secondary to endocrine disorders: Secondary | ICD-10-CM

## 2022-04-18 DIAGNOSIS — E785 Hyperlipidemia, unspecified: Secondary | ICD-10-CM | POA: Diagnosis not present

## 2022-04-18 DIAGNOSIS — E1169 Type 2 diabetes mellitus with other specified complication: Secondary | ICD-10-CM | POA: Diagnosis not present

## 2022-04-18 DIAGNOSIS — E1159 Type 2 diabetes mellitus with other circulatory complications: Secondary | ICD-10-CM

## 2022-04-18 DIAGNOSIS — E039 Hypothyroidism, unspecified: Secondary | ICD-10-CM

## 2022-04-18 LAB — BAYER DCA HB A1C WAIVED: HB A1C (BAYER DCA - WAIVED): 6.5 % — ABNORMAL HIGH (ref 4.8–5.6)

## 2022-04-18 MED ORDER — LEVOTHYROXINE SODIUM 75 MCG PO TABS
75.0000 ug | ORAL_TABLET | Freq: Every day | ORAL | 3 refills | Status: DC
Start: 2022-04-18 — End: 2023-04-10

## 2022-04-18 MED ORDER — ALBUTEROL SULFATE HFA 108 (90 BASE) MCG/ACT IN AERS
2.0000 | INHALATION_SPRAY | Freq: Four times a day (QID) | RESPIRATORY_TRACT | 1 refills | Status: DC | PRN
Start: 2022-04-18 — End: 2022-04-22

## 2022-04-18 MED ORDER — LOSARTAN POTASSIUM 25 MG PO TABS: 25.0000 mg | ORAL_TABLET | Freq: Every day | ORAL | 3 refills | Status: DC

## 2022-04-18 MED ORDER — FLUOXETINE HCL 10 MG PO CAPS: ORAL_CAPSULE | ORAL | 0 refills | Status: DC

## 2022-04-18 NOTE — Progress Notes (Signed)
Subjective: CC:Dm PCP: Raliegh Ip, DO EGB:TDVVO Stacy [name] is Stacy 66 y.o. female presenting to clinic today for:  1. Type 2 Diabetes with hypertension, hyperlipidemia and NAFLD:  She is compliant with her blood pressure and cholesterol medications.  Her diabetes has been diet controlled.  Last eye exam: UTD Last foot exam: needs Last A1c:  Lab Results  Component Value Date   HGBA1C 6.6 (H) 12/10/2021   Nephropathy screen indicated?: UTD Last flu, zoster and/or pneumovax:  Immunization History  Administered Date(s) Administered   H1N1 10/06/2008   Influenza Inj Mdck Quad With Preservative 08/06/2018   Influenza Split 08/25/2017, 08/06/2018, 07/23/2019   Influenza Whole 08/01/2007, 08/04/2009   Influenza,inj,Quad PF,6+ Mos 07/14/2016, 08/25/2017, 08/02/2021   Influenza-Unspecified 08/06/2018, 08/24/2020   Moderna Sars-Covid-2 Vaccination 03/31/2021, 09/07/2021   PFIZER(Purple Top)SARS-COV-2 Vaccination 01/08/2020, 01/29/2020, 08/24/2020   Pneumococcal Conjugate-13 06/08/2018   Pneumococcal Polysaccharide-23 08/01/2007   Td 08/01/2007   Tdap 01/23/2018    ROS: Denies dizziness, LOC, polyuria, polydipsia, unintended weight loss/gain, foot ulcerations, numbness or tingling in extremities, shortness of breath or chest pain.  2. Hypothyroidism Compliant with Synthroid.  No reports of tremor, heart palpitations or changes in bowel habits  3.  Depression/anxiety She would like to start weaning off of the fluoxetine.  Not sure that she really needs it even though things have been Stacy little bit stressful as of late.  ROS: Per HPI  Allergies  Allergen Reactions   Avelox [Moxifloxacin Hcl In Nacl] Hives   Ciprofloxacin     REACTION: Rash   Livalo [Pitavastatin]    Metformin And Related     kidney   Moxifloxacin     REACTION: RASH   Bactrim [Sulfamethoxazole-Trimethoprim] Rash   Statins Other (See Comments)    myalgias   Sulfamethoxazole-Trimethoprim Rash     REACTION: Rash   Past Medical History:  Diagnosis Date   Acute bronchitis with COPD (HCC) 01/18/2010   Qualifier: Diagnosis of  By: Cathey Endow DO, Karen     Allergic rhinitis 02/25/2008   Qualifier: Diagnosis of  By: Thomos Lemons     Angina pectoris (HCC) 06/09/2021   Aortic atherosclerosis (HCC) 08/14/2017   Appreciated on CXR 08/14/2017   Asthma    Asthmatic bronchitis 12/07/2021   Atrophy of vagina 12/07/2021   Centrilobular emphysema (HCC) 05/28/2018   Controlled diabetes mellitus type 2 with complications (HCC) 05/22/2018   Has FLD.   COPD with asthma (HCC) 11/02/2007   Qualifier: Diagnosis of  By: Cathey Endow DO, Karen     Coronary artery disease    Drug-induced myopathy 04/21/2020   Dysphagia 12/07/2021   Dyspnea on exertion 08/21/2018   Essential hypertension 12/07/2021   Family history of osteoporosis 04/22/2019   Family history of rheumatoid arthritis 06/05/2019   FATTY LIVER DISEASE 08/06/2008   Qualifier: Diagnosis of  By: Cathey Endow DO, Karen     Fatty liver disease, nonalcoholic    Ganglion of hand 12/07/2021   GERD 11/02/2007   Qualifier: Diagnosis of  By: Cathey Endow DO, Karen     Hemorrhoids 12/07/2021   Hyperglycemia 12/07/2021   Hyperlipidemia associated with type 2 diabetes mellitus (HCC) 03/24/2020   Hypersomnia with sleep apnea 05/28/2018   HYPERTENSION, BENIGN 12/24/2008   Qualifier: Diagnosis of  By: Jens Som, MD, Lyn Hollingshead    Hypothyroidism 06/13/2007   ? Hashimotos.  Has strong family hx autoimmune d/o No h/o thyroidectomy or radioablation.    IBS (irritable bowel syndrome)    Lateral epicondylitis of left elbow  06/05/2019   Lung disease, bullous (HCC) 08/14/2017   Menopause 12/07/2021   Mixed hyperlipidemia 09/19/2019   Formatting of this note might be different from the original. LDL goal 70 ( DM2, HTN, aortic atherosclerosis)   Obesity (BMI 30.0-34.9) 05/28/2018   Overactive bladder 06/09/2017   Overweight with body mass index (BMI) of 28 to 28.9 in adult 04/22/2019   Pain in  female genitalia on intercourse 12/07/2021   Perimenopausal    PERIMENOPAUSAL SYNDROME 05/13/2009   Qualifier: Diagnosis of  By: Cathey Endow DO, Karen     Polyp of colon 12/07/2021   Post-menopausal 04/22/2019   Precordial pain    Sees Dr Jens Som.  Last cardiac cath 2016 = negative. Strong family h/o MI and CAD   Psychophysiological insomnia 05/28/2018   Pure hypercholesterolemia 12/24/2008   Formatting of this note might be different from the original. Overview:  Qualifier: Diagnosis of  By: Jens Som, MD, Lyn Hollingshead  Last Assessment & Plan:  Management per primary care.   Reactive depression 06/26/2018   Rosacea 08/01/2007   Qualifier: Diagnosis of  By: Cathey Endow DO, Karen     Snoring 05/28/2018   Stage 2 moderate COPD by GOLD classification (HCC) 11/02/2007   Formatting of this note might be different from the original. PFT 05/28/2019           ratio 50% FEV1 53% FVC 83%    DLCO 54% PFT 09/25/18 ([facility]) ratio 52% FEV1 56% FVC 108%; DLCO 69%   Statin intolerance 04/10/2019   Stress incontinence in female 06/09/2017   Unspecified abnormal finding in specimens from other organs, systems and tissues 12/07/2021   UTI'S, RECURRENT 06/13/2007   Qualifier: Diagnosis of  By: Thomos Lemons      Current Outpatient Medications:    albuterol (PROVENTIL) (2.5 MG/3ML) 0.083% nebulizer solution, Take 3 mLs (2.5 mg total) by nebulization every 6 (six) hours as needed for wheezing or shortness of breath., Disp: 75 mL, Rfl: 0   albuterol (VENTOLIN HFA) 108 (90 Base) MCG/ACT inhaler, Inhale 2 puffs into the lungs every 6 (six) hours as needed for wheezing or shortness of breath., Disp: 18 g, Rfl: 11   Evolocumab (REPATHA) 140 MG/ML SOSY, Inject 140 mg into the skin every 14 (fourteen) days., Disp: 2 mL, Rfl: 12   FLUoxetine (PROZAC) 20 MG capsule, TAKE 1 CAPSULE BY MOUTH  DAILY, Disp: 90 capsule, Rfl: 3   levothyroxine (SYNTHROID) 75 MCG tablet, TAKE 1 TABLET BY MOUTH DAILY, Disp: 90 tablet, Rfl: 0   loratadine (CLARITIN)  10 MG tablet, Take 10 mg by mouth daily., Disp: , Rfl:    losartan (COZAAR) 25 MG tablet, TAKE 1 TABLET BY MOUTH ONCE  DAILY, Disp: 100 tablet, Rfl: 0   methocarbamol (ROBAXIN) 500 MG tablet, Take 1 tablet (500 mg total) by mouth 2 (two) times daily., Disp: 20 tablet, Rfl: 0   Semaglutide,0.25 or 0.5MG /DOS, (OZEMPIC, 0.25 OR 0.5 MG/DOSE,) 2 MG/1.5ML SOPN, Inject 0.25 mg into the skin once Stacy week., Disp: 1.5 mL, Rfl: 2   umeclidinium-vilanterol (ANORO ELLIPTA) 62.5-25 MCG/ACT AEPB, Inhale 1 puff into the lungs daily., Disp: 180 each, Rfl: 3   vancomycin (VANCOCIN) 125 MG capsule, 125 mg PO q 6 hours for 14 days, then 125 mg  q 12 hours for 7 days, then 125 mg PO qday for 7 days, then 125 mg PO q 3 days for 5 weeks., Disp: 87 capsule, Rfl: 0   Vitamin D, Ergocalciferol, (DRISDOL) 1.25 MG (50000 UNIT) CAPS capsule, Take  1 capsule (50,000 Units total) by mouth every 7 (seven) days., Disp: 12 capsule, Rfl: 3 Social History   Socioeconomic History   Marital status: Married    Spouse name: Not on file   Number of children: 2   Years of education: Not on file   Highest education level: Associate degree: academic program  Occupational History   Occupation: Works in Photographer   Occupation: Disabled  Tobacco Use   Smoking status: Former    Packs/day: 0.50    Years: 30.00    Total pack years: 15.00    Types: Cigarettes    Quit date: 10/25/2007    Years since quitting: 14.4   Smokeless tobacco: Never  Vaping Use   Vaping Use: Never used  Substance and Sexual Activity   Alcohol use: Yes    Alcohol/week: 1.0 standard drink of alcohol    Types: 1 Glasses of wine per week    Comment: Occasional-once Stacy year   Drug use: No   Sexual activity: Yes    Birth control/protection: Surgical  Other Topics Concern   Not on file  Social History Narrative   Remarried.    Previous occupation: Psychologist, occupational.       Moved from [facility] in [place]   2 children, has stepchildren and grandchildren   Does not exercise   Social  Determinants of Health   Financial Resource Strain: Low Risk  (12/27/2021)   Overall Financial Resource Strain (CARDIA)    Difficulty of Paying Living Expenses: Not hard at all  Food Insecurity: No Food Insecurity (12/27/2021)   Hunger Vital Sign    Worried About Running Out of Food in the Last Year: Never true    Ran Out of Food in the Last Year: Never true  Transportation Needs: No Transportation Needs (12/27/2021)   PRAPARE - Administrator, Civil Service (Medical): No    Lack of Transportation (Non-Medical): No  Physical Activity: Insufficiently Active (12/27/2021)   Exercise Vital Sign    Days of Exercise per Week: 3 days    Minutes of Exercise per Session: 30 min  Stress: No Stress Concern Present (12/27/2021)   [employer] of Occupational Health - Occupational Stress Questionnaire    Feeling of Stress : Not at all  Social Connections: Socially Integrated (12/27/2021)   Social Connection and Isolation Panel [NHANES]    Frequency of Communication with Friends and Family: More than three times Stacy week    Frequency of Social Gatherings with Friends and Family: More than three times Stacy week    Attends Religious Services: More than 4 times per year    Active Member of [employer] or Organizations: Yes    Attends Engineer, structural: More than 4 times per year    Marital Status: Married  Catering manager Violence: Not At Risk (12/27/2021)   Humiliation, Afraid, Rape, and Kick questionnaire    Fear of Current or Ex-Partner: No    Emotionally Abused: No    Physically Abused: No    Sexually Abused: No   Family History  Problem Relation Age of Onset   Hypertension Father    Hyperlipidemia Father    Heart disease Father        Died at age 63 of MI   Hypothyroidism Father    Diabetes Maternal Grandmother    Raynaud syndrome Sister    Rheum arthritis Sister    Thyroid disease Sister    CAD Brother        has 4  stents   Rheum arthritis Brother    Pulmonary fibrosis  Brother    Heart disease Sister 97       h/o MI @age  55   Thyroid disease Sister    Heart disease Mother    Hypothyroidism Mother    Macular degeneration Mother    Breast cancer Neg Hx    Ovarian cancer Neg Hx    Prostate cancer Neg Hx    Colon cancer Neg Hx    Stomach cancer Neg Hx    Esophageal cancer Neg Hx     Objective: Office vital signs reviewed. BP 121/80   Pulse 65   Temp 98.3 F (36.8 C)   Resp 20   Ht 5\' 3"  (1.6 m)   Wt 155 lb (70.3 kg)   SpO2 97%   BMI 27.46 kg/m   Physical Examination:  General: Awake, alert, well nourished, No acute distress HEENT:sclera white, MMM Cardio: regular rate and rhythm, S1S2 heard, no murmurs appreciated Pulm: Globally decreased breath sounds without wheezes, rhonchi or rales.  Normal work of breathing on room air Neuro: see DM foot Diabetic Foot Exam - Simple   Simple Foot Form Diabetic Foot exam was performed with the following findings: Yes 04/18/2022  5:47 PM  Visual Inspection No deformities, no ulcerations, no other skin breakdown bilaterally: Yes Sensation Testing Intact to touch and monofilament testing bilaterally: Yes Pulse Check Posterior Tibialis and Dorsalis pulse intact bilaterally: Yes Comments      Assessment/ Plan: 66 y.o. female   Controlled type 2 diabetes mellitus with other specified complication, without long-term current use of insulin (HCC) - Plan: Bayer DCA Hb A1c Waived  Hypertension associated with diabetes (HCC) - Plan: CMP14+EGFR, losartan (COZAAR) 25 MG tablet  Hyperlipidemia associated with type 2 diabetes mellitus (HCC) - Plan: CMP14+EGFR, Lipid panel  Fatty liver disease, nonalcoholic  Statin-induced myositis  Acquired hypothyroidism - Plan: T4, free, TSH  Sugar under excellent control.  No changes.  Diabetic foot exam was performed today.  Blood pressure well controlled.  No changes  Check lipid, CMP  Check thyroid levels.  Asymptomatic from Stacy thyroid standpoint  I think  it is reasonable for her to wean off the Prozac if she feels that she is doing well.  We discussed signs and symptoms that would warrant reinitiation.  Reduced dose sent to pharmacy for wean.  No orders of the defined types were placed in this encounter.  Meds ordered this encounter  Medications   losartan (COZAAR) 25 MG tablet    Sig: Take 1 tablet (25 mg total) by mouth daily.    Dispense:  100 tablet    Refill:  3   levothyroxine (SYNTHROID) 75 MCG tablet    Sig: Take 1 tablet (75 mcg total) by mouth daily.    Dispense:  100 tablet    Refill:  3   FLUoxetine (PROZAC) 10 MG capsule    Sig: Take 1 capsule (10 mg total) by mouth daily for 21 days, THEN 1 capsule (10 mg total) every other day for 15 days. Then stop.    Dispense:  30 capsule    Refill:  0   albuterol (VENTOLIN HFA) 108 (90 Base) MCG/ACT inhaler    Sig: Inhale 2 puffs into the lungs every 6 (six) hours as needed for wheezing or shortness of breath.    Dispense:  18 g    Refill:  1     Stacy Llanas Hulen Skains, DO [facility] [phone]

## 2022-04-19 LAB — CMP14+EGFR
ALT: 37 IU/L — ABNORMAL HIGH (ref 0–32)
AST: 33 IU/L (ref 0–40)
Albumin/Globulin Ratio: 2.1 (ref 1.2–2.2)
Albumin: 4.4 g/dL (ref 3.8–4.8)
Alkaline Phosphatase: 90 IU/L (ref 44–121)
BUN/Creatinine Ratio: 15 (ref 12–28)
BUN: 12 mg/dL (ref 8–27)
Bilirubin Total: 0.3 mg/dL (ref 0.0–1.2)
CO2: 21 mmol/L (ref 20–29)
Calcium: 9.5 mg/dL (ref 8.7–10.3)
Chloride: 101 mmol/L (ref 96–106)
Creatinine, Ser: 0.79 mg/dL (ref 0.57–1.00)
Globulin, Total: 2.1 g/dL (ref 1.5–4.5)
Glucose: 142 mg/dL — ABNORMAL HIGH (ref 70–99)
Potassium: 4.6 mmol/L (ref 3.5–5.2)
Sodium: 139 mmol/L (ref 134–144)
Total Protein: 6.5 g/dL (ref 6.0–8.5)
eGFR: 83 mL/min/{1.73_m2} (ref >59)

## 2022-04-19 LAB — LIPID PANEL
Chol/HDL Ratio: 3.5 ratio (ref 0.0–4.4)
Cholesterol, Total: 143 mg/dL (ref 100–199)
HDL: 41 mg/dL (ref >39)
LDL Chol Calc (NIH): 68 mg/dL (ref 0–99)
Triglycerides: 202 mg/dL — ABNORMAL HIGH (ref 0–149)
VLDL Cholesterol Cal: 34 mg/dL (ref 5–40)

## 2022-04-19 LAB — T4, FREE: Free T4: 1.42 ng/dL (ref 0.82–1.77)

## 2022-04-19 LAB — TSH: TSH: 2.88 u[IU]/mL (ref 0.450–4.500)

## 2022-04-22 ENCOUNTER — Telehealth: Payer: Self-pay | Admitting: *Deleted

## 2022-04-22 DIAGNOSIS — J432 Centrilobular emphysema: Secondary | ICD-10-CM

## 2022-04-22 MED ORDER — ALBUTEROL SULFATE HFA 108 (90 BASE) MCG/ACT IN AERS: 2.0000 | INHALATION_SPRAY | Freq: Four times a day (QID) | RESPIRATORY_TRACT | 1 refills | Status: DC | PRN

## 2022-04-22 NOTE — Telephone Encounter (Signed)
Please inquire as to whether pt has a preference of any of the covered formulations and I will gladly send in alternative.

## 2022-04-22 NOTE — Telephone Encounter (Signed)
She does not have a preference, whatever you think is best

## 2022-04-22 NOTE — Addendum Note (Signed)
Addended by: [name] on: 04/22/2022 11:40 AM   Modules accepted: Orders

## 2022-04-22 NOTE — Telephone Encounter (Signed)
Fax from [facility] RE: Ventolin HFA AER 90 mcg base NOT covered by pt's ins Covered alternatives: Proventil HFA, ProAir HFA and ProAir Respiclick Please advise and sent in new script

## 2022-04-26 ENCOUNTER — Other Ambulatory Visit: Payer: Self-pay | Admitting: Family Medicine

## 2022-06-02 ENCOUNTER — Telehealth: Payer: Self-pay | Admitting: Cardiology

## 2022-06-02 NOTE — Telephone Encounter (Signed)
Called patient regarding her symptoms and she reported that when she exercises her heart rate will jump from 102 to 178 and then come back down again. Normally when she exercises her heart rate only goes into the 120's - 130's. She also reports having more shortness of breath. While she was resting yesterday she became short of breath and her 02 sat monitor read 89%. Today she checked it again and her 02 sat was 94%. She stated that she has a history of COPD and she is concerned about these readings.

## 2022-06-02 NOTE — Telephone Encounter (Signed)
  Message from patient, we don't have anything til the end of September here in [facility] and nothing in [place] til 8/22 but I dont even know if the patient would be willing to drive there since she lives in [place]. What would you do with this patient? Would this be considered something urgent? CB # [phone]   Appointment Request From: Stacy [name]   With Provider: Jenean Lindau, MD [place]]   Preferred Date Range: Any   Preferred Times: Any Time   Reason for visit: Office Visit   Comments: Shortness of Breath and BPM increase

## 2022-06-06 NOTE — Telephone Encounter (Signed)
Called patient and informed her of Dr. Julien Nordmann recommendation regarding her symptoms and she was agreeable with contacting her PCP ASAP and/or going to urgent care if she felt it was necessary. Patient had no further questions or concerns at this time.

## 2022-06-08 ENCOUNTER — Ambulatory Visit (INDEPENDENT_AMBULATORY_CARE_PROVIDER_SITE_OTHER): Payer: Medicare Other | Admitting: Family Medicine

## 2022-06-08 ENCOUNTER — Ambulatory Visit: Payer: Medicare Other | Admitting: Family Medicine

## 2022-06-08 ENCOUNTER — Encounter: Payer: Self-pay | Admitting: Family Medicine

## 2022-06-08 VITALS — BP 132/79 | HR 88 | Temp 98.7°F | Ht 63.0 in | Wt 154.4 lb

## 2022-06-08 DIAGNOSIS — J432 Centrilobular emphysema: Secondary | ICD-10-CM

## 2022-06-08 DIAGNOSIS — R0609 Other forms of dyspnea: Secondary | ICD-10-CM

## 2022-06-08 DIAGNOSIS — R0902 Hypoxemia: Secondary | ICD-10-CM | POA: Diagnosis not present

## 2022-06-08 NOTE — Patient Instructions (Signed)
Home Oxygen Use, Adult When a medical condition keeps you from getting enough oxygen, your health care provider may instruct you to take extra oxygen at home. Your health care provider will let you know: When to take oxygen. How long to take oxygen. How quickly oxygen should be delivered (flow rate), in liters per minute (LPM or L/M). Home oxygen can be given through: A mask. A nasal cannula. This is a device or tube that goes in the nostrils. A transtracheal catheter. This is a small, thin tube placed in the windpipe (trachea). A breathing tube (tracheostomy tube) that is surgically placed in the windpipe. This may be used in severe cases. These devices are connected with tubing to an oxygen source, such as: A tank. Tanks hold oxygen in gas form. They must be replaced when the oxygen is used up. A liquid oxygen device. This holds oxygen in liquid form. Liquid oxygen is very cold. It must be replaced when the oxygen is used up. An oxygen concentrator machine. This filters oxygen in the room. There are two types of oxygen concentrator machines--stationary and portable. A stationary oxygen concentrator machine plugs into the main electricity supply at your home. You must have a backup cylinder of oxygen in case the power goes out. A portable oxygen concentrator machine is smaller in size and more lightweight. This machine uses battery supply and can be used outside the home. Work with your health care provider to find equipment that works best for you and your lifestyle. What are the risks? Delivery of supplemental oxygen is generally safe. However, some risks include: Fire. This can happen if the oxygen is exposed to a heat source, flame, or spark. Injury to skin. This can happen if liquid oxygen touches your skin. Damage to the lungs or other organs. This can happen from getting too little or too much oxygen. Supplies needed: To use oxygen, you will need: A mask, nasal cannula, transtracheal  catheter, or tracheostomy. An oxygen tank, a liquid oxygen device, or an oxygen concentrator. The tape that your health care provider recommends (optional). Your health care provider may also recommend: A humidifier to warm and moisten the oxygen delivered. This will depend on how much oxygen you need and the type of home oxygen device you use. A pulse oximeter. This device measures the percentage of oxygen in your blood. How to use oxygen Your health care provider or a person from your medical device company will show you how to use your oxygen device. Follow his or her instructions. The instructions may look something like this: Wash your hands with soap and water. If you use an oxygen concentrator, make sure it is plugged in. Place one end of the tube into the port on the tank, device, or machine. Place the mask over your nose and mouth. Or, place the nasal cannula and secure it with tape if instructed. If you use a tracheostomy or transtracheal catheter, connect it to the oxygen source as directed. Make sure the liter-flow setting on the machine is at the level prescribed by your health care provider. Turn on the machine or adjust the knob on the tank or device to the correct liter-flow setting. When you are done, turn off and unplug the machine, or turn the knob to OFF. How to clean and care for the oxygen supplies Nasal cannula Clean it with a warm, wet cloth daily or as needed. Wash it with a liquid soap once a week. Rinse it thoroughly once or twice a   week. Air-dry it. Replace it every 2-4 weeks. If you have an infection, such as a cold or pneumonia, change the cannula when you get better. Mask Replace it every 2-4 weeks. If you have an infection, such as a cold or pneumonia, change the mask when you get better. Humidifier bottle Wash the bottle between each refill: Wash it with soap and warm water. Rinse it thoroughly. Clean it and its top with a disinfectant cleaner. Air-dry  it. Make sure it is dry before you refill it. Oxygen concentrator Clean the air filter at least twice a week according to directions from your home medical equipment and service company. Wipe down the cabinet every day. To do this: Unplug the unit. Wipe down the cabinet with a damp cloth. Dry the cabinet. Other equipment Change any extra tubing every 1-3 months. Follow instructions from your health care provider about taking care of any other equipment. Safety tips Fire safety tips  Keep your oxygen and oxygen supplies at least 6 ft (2 m) away from sources of heat, flames, and sparks at all times. Do not allow smoking near your oxygen. Put up "no smoking" signs in your home. Avoid smoking areas when in public. Do not use materials that can burn (are flammable) while you use oxygen. This includes: Petroleum jelly. Hair spray or other aerosol sprays. Rubbing alcohol. Hand sanitizer. When you go to a restaurant with portable oxygen, ask to be seated in the non-smoking section. Keep a fire extinguisher close by. Let your fire department know that you have oxygen in your home. Test your home smoke detectors regularly. Traveling Secure your oxygen tank in the vehicle so that it does not move around. Follow instructions from your medical device company about how to safely secure your tank. Make sure you have enough oxygen for the amount of time you will be away from home. If you are planning to travel by public transportation (airplane, train, bus, or boat), contact the company to find out if it allows the use of an approved portable oxygen concentrator. You may also need documents from your health care provider and medical device company before you travel. General safety tips If you use an oxygen cylinder, make sure it is in a stand or secured to an object that will not move (fixed object). If you use liquid oxygen, make sure its container is kept upright at all times. If you use an oxygen  concentrator: Tell your electric company. Make sure you are given priority service in the event that your power goes out. Avoid using extension cords if possible. Follow these instructions at home: Use oxygen only as told by your health care provider. Do not use alcohol or other drugs that make you relax (sedating drugs) unless instructed. They can slow down your breathing rate and make it hard to get in enough oxygen. Know how and when to order a refill of oxygen. Always keep a spare tank of oxygen. Plan ahead for holidays when you may not be able to get a prescription filled. Use water-based lubricants on your lips or nostrils. Do not use oil-based products like petroleum jelly. To prevent skin irritation on your cheeks or behind your ears, tuck some gauze under the tubing. Where to find more information American Lung Association: www.lung.org/oxygen Contact a health care provider if: You get headaches often. You have a lasting cough. You are restless or have anxiety. You develop an illness that affects your breathing. You cannot exercise at your regular level. You   have a fever. You have persistent redness under your nose. Get help right away if: You are confused. You are sleepy all the time. You have blue lips or fingernails. You have difficult or irregular breathing that is getting worse. You are struggling to breathe. These symptoms may represent a serious problem that is an emergency. Do not wait to see if the symptoms will go away. Get medical help right away. Call your local emergency services (911 in the [place] Do not drive yourself to the hospital. Summary Your health care provider or a person from your medical device company will show you how to use your oxygen device. Follow his or her instructions. If you use an oxygen concentrator, make sure it is plugged in. Make sure the liter-flow setting on the machine is at the level prescribed by your health care provider. Use  oxygen only as told by your health care provider. Keep your oxygen and oxygen supplies at least 6 ft (2 m) away from sources of heat, flames, and sparks at all times. This information is not intended to replace advice given to you by your health care provider. Make sure you discuss any questions you have with your health care provider. Document Revised: 02/10/2022 Document Reviewed: 10/08/2019 Elsevier Patient Education  2023 Elsevier Inc.  

## 2022-06-08 NOTE — Progress Notes (Signed)
   Acute Office Visit  Subjective:     Patient ID: Stacy [name], female    DOB: [birth year], 66 y.o.   MRN: [id number]  Chief Complaint  Patient presents with   Shortness of Breath    Shortness of Breath   Patient is in today for shortness of breath. She has a history of COPD but has felt more short of breath with exertion this week than is typical for her. She has been monitoring her 02 sats at home and has had readings in the 80s intermittently. She has also noted a higher heart rate with exercise than is typical for her. Her heart rate has gotten as high as 178. She denies increase in cough or sputum. Denies fever. She has been using her anoro as prescribed and using albuterol usually 1x per day. She is establish with pulmonology. She denies chest pain or edema. No recent illness.   Review of Systems  Respiratory:  Positive for shortness of breath.    As per HPI.      Objective:    BP 132/79   Pulse 88   Temp 98.7 F (37.1 C) (Temporal)   Ht '5\' 3"'$  (1.6 m)   Wt 154 lb 6 oz (70 kg)   SpO2 97%   BMI 27.35 kg/m    Physical Exam Vitals and nursing note reviewed.  Constitutional:      General: She is not in acute distress.    Appearance: She is not ill-appearing, toxic-appearing or diaphoretic.  HENT:     Head: Normocephalic and atraumatic.  Neck:     Vascular: No JVD.  Cardiovascular:     Rate and Rhythm: Normal rate and regular rhythm. No extrasystoles are present.    Heart sounds: No murmur heard. Pulmonary:     Effort: Pulmonary effort is normal. No tachypnea or respiratory distress.     Breath sounds: Normal breath sounds. No decreased breath sounds, wheezing, rhonchi or rales.  Musculoskeletal:     Right lower leg: No edema.     Left lower leg: No edema.  Skin:    General: Skin is warm and dry.  Neurological:     General: No focal deficit present.     Mental Status: She is alert and oriented to person, place, and time.  Psychiatric:        Mood and  Affect: Mood normal.        Behavior: Behavior normal.     No results found for any visits on 06/08/22.      Assessment & Plan:   [name] was seen today for shortness of breath.  Diagnoses and all orders for this visit:  Centrilobular emphysema ([place]) Dyspnea on exertion Hypoxia 02 sat at 95% today on room air at rest. 02 sat dropped to 86% while walking on room air. 02 sat was stable at 96% on 2L of 02 while walking today. Orders for home 02 given. High pulse seen was 115 with walking today. Lung clear on exam and unlabored respirations at rest. Discussed for patient to follow up with pulmonology.  -     For home use only DME oxygen  Return for follow up with pulmonology.  The patient indicates understanding of these issues and agrees with the plan.  Gwenlyn Perking, FNP

## 2022-06-09 ENCOUNTER — Telehealth: Payer: Self-pay | Admitting: Pulmonary Disease

## 2022-06-09 ENCOUNTER — Encounter: Payer: Self-pay | Admitting: Family Medicine

## 2022-06-09 DIAGNOSIS — R0902 Hypoxemia: Secondary | ICD-10-CM | POA: Diagnosis not present

## 2022-06-09 NOTE — Telephone Encounter (Signed)
Called patient and informed her that since her primary care doctor placed her on oxygen and started the order for the oxygen and walked her in office they would be the ones to request the POC for her. Patient verbalized understanding. Nothing further needed

## 2022-06-09 NOTE — Progress Notes (Signed)
Oxygen order community message sent to [facility], [place].

## 2022-07-07 ENCOUNTER — Encounter: Payer: Self-pay | Admitting: Emergency Medicine

## 2022-07-07 ENCOUNTER — Ambulatory Visit: Payer: Medicare Other | Admitting: Emergency Medicine

## 2022-07-07 DIAGNOSIS — J449 Chronic obstructive pulmonary disease, unspecified: Secondary | ICD-10-CM | POA: Diagnosis not present

## 2022-07-07 NOTE — Assessment & Plan Note (Signed)
Severe COPD but strange presentation for her sub-acute hypoxemia.  She did not desaturate on ambulation in May 2023[facility] with significant improvement in her overall breathing and functional capacity.  She then really noticed tachycardia, fatigue, hypoxemia.  This was reproduced on ambulation and she was given supplemental oxygen.  Certainly this could all be due to her underlying lung disease but I think we need to rule out episodic tachycardia, A-fib/SVT with associated desaturations.  She has been seen by Dr. Geraldo Pitter and I will try to set up a visit with him to consider an event monitor.  I will also perform repeat ambulatory oximetry today to see if she desaturates.  If so we will get her a portable oxygen concentrator  Please continue Anoro once daily. Keep albuterol available to use 2 puffs when you needed for shortness of breath, chest tightness, wheezing. We will repeat your walking oximetry today to confirm whether you need to use supplemental oxygen when you exert yourself.  If so we will work on getting you a portable oxygen concentrator. We will set you up to see Dr. Geraldo Pitter with cardiology, specifically to discuss your recent episodic tachycardia that is associated with hypoxemia.  I believe you may benefit from an event monitor to try to catch any evidence of irregular heartbeat. Follow-up with APP in 1 month to review your status Follow with Dr Lamonte Sakai in 6 months or sooner if you have any problems.

## 2022-07-07 NOTE — Progress Notes (Signed)
Subjective:    Patient ID: Stacy [name], female    DOB: [birth year], 66 y.o.   MRN: [id number]  Shortness of Breath Pertinent negatives include no headaches, leg swelling or wheezing.    ROV 03/01/22 --follow-up visit 66 year old woman with a history of COPD with an asthmatic component.  She is currently managed on.  She has chronic cough in the setting of her COPD as well as allergic rhinitis, GERD.  She has been having persistent dyspnea and so we repeated her pulmonary function testing today as below. Today she report that she feels about the same - some good and bad days with breathing limiting her.  Her last 6 minute walk was 3 yrs ago  Coughing daily, thick mucous. Using albuterol 3x a month She is on Anoro - coughed w breztri and trelegy - coughed with these.   PFT done today and reviewed by me show very severe obstruction with an FEV1 of 1.13 L, no significant bronchodilator response, hyperinflated lung volumes, decreased diffusion capacity that corrects to normal range when adjusted for alveolar volume.  Her FEV1 was 0.97 in March 2019, 1.06 in December 2018.  ROV 07/07/2022 --66 year old woman with very severe COPD and a positive bronchodilator response, chronic cough and in the setting of this as well as allergic rhinitis and GERD.   She returns today reporting that she had been doing well, had been going to exercise at the [facility]. She has since noticed significant fatigue with exercise, has seen tachycardia to > 100 and has correlated this with desaturations on RA. No clear precipitating event like a URI She had an ambulatory oximetry 03/01/2022 in our office that did not show desaturation.  Subsequently she has been placed on supplemental oxygen 06/08/2022 by her PCP (86% on room air with ambulation), but is using prn (rarely). She is using albuterol a few times a day (an increase). No wheeze, stable chest tightness.    Review of Systems  HENT:  Positive for congestion. Negative for  postnasal drip and sinus pressure.   Respiratory:  Negative for cough, chest tightness, shortness of breath and wheezing.   Cardiovascular:  Negative for palpitations and leg swelling.  Allergic/Immunologic: Negative.   Neurological:  Negative for headaches.  Psychiatric/Behavioral:  The patient is not nervous/anxious.     Past Medical History:  Diagnosis Date   Acute bronchitis with COPD ([place]) 01/18/2010   Qualifier: Diagnosis of  By: Valetta Close DO, Karen     Allergic rhinitis 02/25/2008   Qualifier: Diagnosis of  By: Valetta Close DO, Karen     Angina pectoris ([place]) 06/09/2021   Aortic atherosclerosis ([place]) 08/14/2017   Appreciated on CXR 08/14/2017   Asthma    Asthmatic bronchitis 12/07/2021   Atrophy of vagina 12/07/2021   Centrilobular emphysema ([place]) 05/28/2018   Controlled diabetes mellitus type 2 with complications ([place]) 5/68/1275   Has FLD.   COPD with asthma ([place]) 11/02/2007   Qualifier: Diagnosis of  By: Valetta Close DO, Karen     Coronary artery disease    Drug-induced myopathy 04/21/2020   Dysphagia 12/07/2021   Dyspnea on exertion 08/21/2018   Essential hypertension 12/07/2021   Family history of osteoporosis 04/22/2019   Family history of rheumatoid arthritis 06/05/2019   FATTY LIVER DISEASE 08/06/2008   Qualifier: Diagnosis of  By: Valetta Close DO, Karen     Fatty liver disease, nonalcoholic    Ganglion of hand 12/07/2021   GERD 11/02/2007   Qualifier: Diagnosis of  By: Esmeralda Arthur  Hemorrhoids 12/07/2021   Hyperglycemia 12/07/2021   Hyperlipidemia associated with type 2 diabetes mellitus ([place]) 03/24/2020   Hypersomnia with sleep apnea 05/28/2018   HYPERTENSION, BENIGN 12/24/2008   Qualifier: Diagnosis of  By: Stanford Breed, MD, Kandyce Rud    Hypothyroidism 06/13/2007   ? [place].  Has strong family hx autoimmune d/o No h/o thyroidectomy or radioablation.    IBS (irritable bowel syndrome)    Lateral epicondylitis of left elbow 06/05/2019   Lung disease, bullous ([place]) 08/14/2017   Menopause  12/07/2021   Mixed hyperlipidemia 09/19/2019   Formatting of this note might be different from the original. LDL goal 70 ( DM2, HTN, aortic atherosclerosis)   Obesity (BMI 30.0-34.9) 05/28/2018   Overactive bladder 06/09/2017   Overweight with body mass index (BMI) of 28 to 28.9 in adult 04/22/2019   Pain in female genitalia on intercourse 12/07/2021   Perimenopausal    PERIMENOPAUSAL SYNDROME 05/13/2009   Qualifier: Diagnosis of  By: Valetta Close DO, Karen     Polyp of colon 12/07/2021   Post-menopausal 04/22/2019   Precordial pain    Sees Dr Stanford Breed.  Last cardiac cath 2016 = negative. Strong family h/o MI and CAD   Psychophysiological insomnia 05/28/2018   Pure hypercholesterolemia 12/24/2008   Formatting of this note might be different from the original. Overview:  Qualifier: Diagnosis of  By: Stanford Breed, MD, FACC, [place]:  Management per primary care.   Reactive depression 06/26/2018   Rosacea 08/01/2007   Qualifier: Diagnosis of  By: Valetta Close DO, Karen     Snoring 05/28/2018   Stage 2 moderate COPD by GOLD classification ([place]) 11/02/2007   Formatting of this note might be different from the original. PFT 05/28/2019           ratio 50% FEV1 53% FVC 83%    DLCO 54% PFT 09/25/18 ([facility]) ratio 52% FEV1 56% FVC 108%; DLCO 69%   Statin intolerance 04/10/2019   Stress incontinence in female 06/09/2017   Unspecified abnormal finding in specimens from other organs, systems and tissues 12/07/2021   UTI'S, RECURRENT 06/13/2007   Qualifier: Diagnosis of  By: Esmeralda Arthur       Family History  Problem Relation Age of Onset   Hypertension Father    Hyperlipidemia Father    Heart disease Father        Died at age 2 of MI   Hypothyroidism Father    Diabetes Maternal Grandmother    Raynaud syndrome Sister    Rheum arthritis Sister    Thyroid disease Sister    CAD Brother        has 4 stents   Rheum arthritis Brother    Pulmonary fibrosis Brother    Heart disease Sister 60       h/o MI  '@age'$  32   Thyroid disease Sister    Heart disease Mother    Hypothyroidism Mother    Macular degeneration Mother    Breast cancer Neg Hx    Ovarian cancer Neg Hx    Prostate cancer Neg Hx    Colon cancer Neg Hx    Stomach cancer Neg Hx    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  No other occupational exposure.  Has lived in [place] and [place] No military   Allergies  Allergen Reactions   Avelox [Moxifloxacin Hcl In Nacl] Hives   Ciprofloxacin     REACTION: Rash  Livalo [Pitavastatin]    Metformin And Related     kidney   Moxifloxacin     REACTION: RASH   Bactrim [Sulfamethoxazole-Trimethoprim] Rash   Statins Other (See Comments)    myalgias   Sulfamethoxazole-Trimethoprim Rash    REACTION: Rash     Outpatient Medications Prior to Visit  Medication Sig Dispense Refill   albuterol (PROVENTIL) (2.5 MG/3ML) 0.083% nebulizer solution Take 3 mLs (2.5 mg total) by nebulization every 6 (six) hours as needed for wheezing or shortness of breath. 75 mL 0   albuterol (VENTOLIN HFA) 108 (90 Base) MCG/ACT inhaler Inhale 2 puffs into the lungs every 6 (six) hours as needed for wheezing or shortness of breath. 16 g 1   Evolocumab (REPATHA) 140 MG/ML SOSY Inject 140 mg into the skin every 14 (fourteen) days. 2 mL 12   levothyroxine (SYNTHROID) 75 MCG tablet Take 1 tablet (75 mcg total) by mouth daily. 100 tablet 3   loratadine (CLARITIN) 10 MG tablet Take 10 mg by mouth daily.     losartan (COZAAR) 25 MG tablet Take 1 tablet (25 mg total) by mouth daily. 100 tablet 3   methocarbamol (ROBAXIN) 500 MG tablet Take 1 tablet (500 mg total) by mouth 2 (two) times daily. 20 tablet 0   umeclidinium-vilanterol (ANORO ELLIPTA) 62.5-25 MCG/ACT AEPB Inhale 1 puff into the lungs daily. 180 each 3   Vitamin D, Ergocalciferol, (DRISDOL) 1.25 MG (50000 UNIT) CAPS capsule Take 1 capsule (50,000 Units total) by mouth every 7 (seven) days. 12 capsule 3   No facility-administered  medications prior to visit.        Objective:   Physical Exam Vitals:   07/07/22 1447  BP: 126/74  Pulse: 100  Temp: 98.3 F (36.8 C)  TempSrc: Oral  SpO2: 96%  Weight: 155 lb 9.6 oz (70.6 kg)  Height: 5' 3.5" (1.613 m)   Gen: Pleasant, well-nourished, in no distress,  normal affect  ENT: No lesions,  mouth clear,  oropharynx clear, no postnasal drip  Neck: No JVD, no stridor  Lungs: No use of accessory muscles, clear without rales or rhonchi on a normal breath.  End expiratory squeak on forced expiration  Cardiovascular: Regular, no evidence of A-fib, heart sounds normal, no murmur or gallops, no peripheral edema  Musculoskeletal: No deformities, no cyanosis or clubbing  Neuro: alert, non focal  Skin: Warm, no lesions or rash       Assessment & Plan:  COPD Gold C Severe COPD but strange presentation for her sub-acute hypoxemia.  She did not desaturate on ambulation in May 2023, [facility] with significant improvement in her overall breathing and functional capacity.  She then really noticed tachycardia, fatigue, hypoxemia.  This was reproduced on ambulation and she was given supplemental oxygen.  Certainly this could all be due to her underlying lung disease but I think we need to rule out episodic tachycardia, A-fib/SVT with associated desaturations.  She has been seen by Dr. Geraldo Pitter and I will try to set up a visit with him to consider an event monitor.  I will also perform repeat ambulatory oximetry today to see if she desaturates.  If so we will get her a portable oxygen concentrator  Please continue Anoro once daily. Keep albuterol available to use 2 puffs when you needed for shortness of breath, chest tightness, wheezing. We will repeat your walking oximetry today to confirm whether you need to use supplemental oxygen when you exert yourself.  If so we will work on getting you a portable oxygen concentrator. We will set you up to see Dr.  Geraldo Pitter with cardiology, specifically to discuss your recent episodic tachycardia that is associated with hypoxemia.  I believe you may benefit from an event monitor to try to catch any evidence of irregular heartbeat. Follow-up with APP in 1 month to review your status Follow with Dr Lamonte Sakai in 6 months or sooner if you have any problems.  Baltazar Apo, MD, PhD 07/07/2022, 3:11 PM [facility] [phone] or if no answer [phone]

## 2022-07-07 NOTE — Patient Instructions (Signed)
Please continue Anoro once daily. Keep albuterol available to use 2 puffs when you needed for shortness of breath, chest tightness, wheezing. We will repeat your walking oximetry today to confirm whether you need to use supplemental oxygen when you exert yourself.  If so we will work on getting you a portable oxygen concentrator. We will set you up to see Dr. Geraldo Pitter with cardiology, specifically to discuss your recent episodic tachycardia that is associated with hypoxemia.  I believe you may benefit from an event monitor to try to catch any evidence of irregular heartbeat. Follow-up with APP in 1 month to review your status Follow with Dr Lamonte Sakai in 6 months or sooner if you have any problems.

## 2022-07-10 ENCOUNTER — Other Ambulatory Visit: Payer: Self-pay | Admitting: Cardiology

## 2022-07-10 DIAGNOSIS — E7800 Pure hypercholesterolemia, unspecified: Secondary | ICD-10-CM

## 2022-07-10 DIAGNOSIS — E782 Mixed hyperlipidemia: Secondary | ICD-10-CM

## 2022-07-10 DIAGNOSIS — E1169 Type 2 diabetes mellitus with other specified complication: Secondary | ICD-10-CM

## 2022-07-10 DIAGNOSIS — R0902 Hypoxemia: Secondary | ICD-10-CM | POA: Diagnosis not present

## 2022-07-10 DIAGNOSIS — I700 Atherosclerosis of aorta: Secondary | ICD-10-CM

## 2022-08-03 ENCOUNTER — Encounter: Payer: Self-pay | Admitting: Cardiology

## 2022-08-03 ENCOUNTER — Ambulatory Visit: Payer: Medicare Other | Attending: Cardiology | Admitting: Cardiology

## 2022-08-03 ENCOUNTER — Ambulatory Visit: Payer: Medicare Other | Attending: Cardiology

## 2022-08-03 VITALS — BP 124/78 | HR 73 | Ht 63.5 in | Wt 153.0 lb

## 2022-08-03 DIAGNOSIS — E039 Hypothyroidism, unspecified: Secondary | ICD-10-CM

## 2022-08-03 DIAGNOSIS — R0602 Shortness of breath: Secondary | ICD-10-CM

## 2022-08-03 DIAGNOSIS — I700 Atherosclerosis of aorta: Secondary | ICD-10-CM | POA: Diagnosis not present

## 2022-08-03 DIAGNOSIS — R002 Palpitations: Secondary | ICD-10-CM

## 2022-08-03 DIAGNOSIS — E785 Hyperlipidemia, unspecified: Secondary | ICD-10-CM | POA: Diagnosis not present

## 2022-08-03 DIAGNOSIS — Z789 Other specified health status: Secondary | ICD-10-CM | POA: Diagnosis not present

## 2022-08-03 DIAGNOSIS — E1169 Type 2 diabetes mellitus with other specified complication: Secondary | ICD-10-CM | POA: Diagnosis not present

## 2022-08-03 DIAGNOSIS — I2510 Atherosclerotic heart disease of native coronary artery without angina pectoris: Secondary | ICD-10-CM

## 2022-08-03 DIAGNOSIS — I10 Essential (primary) hypertension: Secondary | ICD-10-CM | POA: Diagnosis not present

## 2022-08-03 HISTORY — DX: Palpitations: R00.2

## 2022-08-03 LAB — D-DIMER, QUANTITATIVE: D-DIMER: 0.38 mg/L FEU (ref 0.00–0.49)

## 2022-08-03 MED ORDER — METOPROLOL SUCCINATE ER 25 MG PO TB24: 25.0000 mg | ORAL_TABLET | Freq: Every day | ORAL | 3 refills | Status: DC

## 2022-08-03 NOTE — Progress Notes (Signed)
Cardiology Office Note:    Date:  08/03/2022   ID:  Stacy [name], DOB 10/23/1956, MRN [id number]  PCP:  Stacy Norlander, DO  Cardiologist:  Stacy Lindau, [name]   Referring [name]: Stacy Norlander, DO    ASSESSMENT:    1. Aortic atherosclerosis ([place])   2. Coronary artery disease involving native coronary artery of native heart without angina pectoris   3. Essential hypertension   4. Hyperlipidemia associated with type 2 diabetes mellitus (HCC)   5. Statin intolerance    PLAN:    In order of problems listed above:  Mild coronary artery disease: Secondary prevention stressed with the patient.  Importance of compliance with diet medication stressed and she vocalized understanding. Palpitations and tachycardia: We will do complete blood work today.  I will also do a TSH CBC and a D-dimer.  I discussed this with her and she vocalized understanding and questions were answered to her satisfaction.  I would also like to do a 2-week monitor to see if she has any tachyarrhythmias which are pathological.  I have discussed with my nurse to do the D-dimer is a stat and for the lab to call me if the lab work is abnormal. Essential hypertension: Blood pressure stable and diet was emphasized.  Because of the above findings have changed her medication to Toprol-XL 25 mg in the morning. Mixed dyslipidemia: We will do blood work today she is fasting and will check lipids also. Cardiac murmur: Echocardiogram will be done to assess murmur heard on auscultation. Patient will be seen in follow-up appointment in 6 months or earlier if the patient has any concerns    Medication Adjustments/Labs and Tests Ordered: Current medicines are reviewed at length with the patient today.  Concerns regarding medicines are outlined above.  No orders of the defined types were placed in this encounter.  No orders of the defined types were placed in this encounter.    No chief complaint on file.    History  of Present Illness:    Stacy [name] is a 66 y.o. female.  Patient has past medical history of mild nonobstructive coronary artery disease, COPD, essential hypertension and dyslipidemia.  She mentions to me that recently she is having fast heartbeats on exertion.  Her heart rate is gone up to 140 or 150 at times on her pulse ox.  No chest pain orthopnea PND dizziness or syncope.  She is compliant with medications she is concerned about these findings.  She is seeing a pulmonologist for this.  Past Medical History:  Diagnosis Date   Allergic rhinitis 02/25/2008   Qualifier: Diagnosis of  By: Stacy [name]     Angina pectoris ([place]) 06/09/2021   Aortic atherosclerosis ([place]) 08/14/2017   Appreciated on CXR 08/14/2017   Asthma    Asthmatic bronchitis 12/07/2021   Atrophy of vagina 12/07/2021   Centrilobular emphysema ([place]) 05/28/2018   Controlled diabetes mellitus type 2 with complications ([place]) 25/42/7062   Has FLD.   COPD Gold C 11/02/2007   Formatting of this note might be different from the original. PFT 05/28/2019           ratio 50% FEV1 53% FVC 83%    DLCO 54% PFT 09/25/18 ([facility]) ratio 52% FEV1 56% FVC 108%; DLCO 69%   Coronary artery disease    Dysphagia 12/07/2021   Dyspnea on exertion 08/21/2018   Essential hypertension 12/07/2021   Facet arthropathy, lumbar 12/15/2021   Family history of osteoporosis 04/22/2019  Family history of rheumatoid arthritis 06/05/2019   Fatty liver disease, nonalcoholic    Ganglion of hand 12/07/2021   GERD 11/02/2007   Qualifier: Diagnosis of  By: Stacy [name]     Hemorrhoids 12/07/2021   Hyperlipidemia associated with type 2 diabetes mellitus ([place]) 03/24/2020   Hypersomnia with sleep apnea 05/28/2018   HYPERTENSION, BENIGN 12/24/2008   Qualifier: Diagnosis of  By: Stacy [name]    Hypothyroidism 06/13/2007   ? Hashimotos.  Has strong family hx autoimmune d/o No h/o thyroidectomy or radioablation.    IBS (irritable bowel  syndrome)    Lateral epicondylitis of left elbow 06/05/2019   Lung disease, bullous ([place]) 08/14/2017   Menopause 12/07/2021   Obesity (BMI 30.0-34.9) 05/28/2018   Osteopenia of multiple sites 12/15/2021   Overactive bladder 06/09/2017   Pain in female genitalia on intercourse 12/07/2021   Polyp of colon 12/07/2021   Post-menopausal 04/22/2019   Precordial pain    Sees Dr Stacy [name].  Last cardiac cath 2016 = negative. Strong family h/o MI and CAD   Psychophysiological insomnia 05/28/2018   Reactive depression 06/26/2018   Rosacea 08/01/2007   Qualifier: Diagnosis of  By: Stacy [name]     Snoring 05/28/2018   Stage 2 moderate COPD by GOLD classification ([place]) 11/02/2007   Formatting of this note might be different from the original. PFT 05/28/2019           ratio 50% FEV1 53% FVC 83%    DLCO 54% PFT 09/25/18 ([facility]) ratio 52% FEV1 56% FVC 108%; DLCO 69%   Statin intolerance 04/10/2019   Stress incontinence in female 06/09/2017   UTI'S, RECURRENT 06/13/2007   Qualifier: Diagnosis of  By: Stacy [name]     Vitamin D insufficiency 12/15/2021    Past Surgical History:  Procedure Laterality Date   ABLATION     CARDIAC CATHETERIZATION N/A 06/12/2015   Procedure: Left Heart Cath and Coronary Angiography;  Surgeon: Stacy [name];  Location: [facility];  Service: Cardiovascular;  Laterality: N/A;   CESAREAN SECTION     LTCS     x2   SPIROMETRY  01/05/2007   FVC 68% predicted; FEV1 44% predicted; FEV1/FVC 63% predicted = moderate obstruction with low vital capacity possibly due to restriction   TUBAL LIGATION      Current Medications: Current Meds  Medication Sig   albuterol (PROVENTIL) (2.5 MG/3ML) 0.083% nebulizer solution Take 3 mLs (2.5 mg total) by nebulization every 6 (six) hours as needed for wheezing or shortness of breath.   albuterol (VENTOLIN HFA) 108 (90 Base) MCG/ACT inhaler Inhale 2 puffs into the lungs every 6 (six) hours as needed for wheezing or  shortness of breath.   Evolocumab (REPATHA) 140 MG/ML SOSY Inject 140 mg into the skin every 14 (fourteen) days.   levothyroxine (SYNTHROID) 75 MCG tablet Take 1 tablet (75 mcg total) by mouth daily.   loratadine (CLARITIN) 10 MG tablet Take 10 mg by mouth daily.   losartan (COZAAR) 25 MG tablet Take 1 tablet (25 mg total) by mouth daily.   methocarbamol (ROBAXIN) 500 MG tablet Take 1 tablet (500 mg total) by mouth 2 (two) times daily.   umeclidinium-vilanterol (ANORO ELLIPTA) 62.5-25 MCG/ACT AEPB Inhale 1 puff into the lungs daily.   Vitamin D, Ergocalciferol, (DRISDOL) 1.25 MG (50000 UNIT) CAPS capsule Take 1 capsule (50,000 Units total) by mouth every 7 (seven) days.     Allergies:   Avelox [moxifloxacin hcl in nacl],  Ciprofloxacin, Livalo [pitavastatin], Metformin and related, Moxifloxacin, Bactrim [sulfamethoxazole-trimethoprim], Statins, and Sulfamethoxazole-trimethoprim   Social History   Socioeconomic History   Marital status: Married    Spouse name: Not on file   Number of children: 2   Years of education: Not on file   Highest education level: Associate degree: academic program  Occupational History   Occupation: Works in Science writer   Occupation: Disabled  Tobacco Use   Smoking status: Former    Packs/day: 0.50    Years: 30.00    Total pack years: 15.00    Types: Cigarettes    Quit date: 10/25/2007    Years since quitting: 14.7   Smokeless tobacco: Never  Vaping Use   Vaping Use: Never used  Substance and Sexual Activity   Alcohol use: Yes    Alcohol/week: 1.0 standard drink of alcohol    Types: 1 Glasses of wine per week    Comment: Occasional-once a year   Drug use: No   Sexual activity: Yes    Birth control/protection: Surgical  Other Topics Concern   Not on file  Social History Narrative   Remarried.    Previous occupation: Customer service manager.       Moved from [facility] in 2007   2 children, has stepchildren and grandchildren   Does not exercise   Social Determinants of  Health   Financial Resource Strain: Low Risk  (12/27/2021)   Overall Financial Resource Strain (CARDIA)    Difficulty of Paying Living Expenses: Not hard at all  Food Insecurity: No Food Insecurity (12/27/2021)   Hunger Vital Sign    Worried About Running Out of Food in the Last Year: Never true    Ran Out of Food in the Last Year: Never true  Transportation Needs: No Transportation Needs (12/27/2021)   PRAPARE - Hydrologist (Medical): No    Lack of Transportation (Non-Medical): No  Physical Activity: Insufficiently Active (12/27/2021)   Exercise Vital Sign    Days of Exercise per Week: 3 days    Minutes of Exercise per Session: 30 min  Stress: No Stress Concern Present (12/27/2021)   [place]    Feeling of Stress : Not at all  Social Connections: [place] (12/27/2021)   Social Connection and Isolation Panel [NHANES]    Frequency of Communication with Friends and Family: More than three times a week    Frequency of Social Gatherings with Friends and Family: More than three times a week    Attends Religious Services: More than 4 times per year    Active Member of [employer] or Organizations: Yes    Attends Music therapist: More than 4 times per year    Marital Status: Married     Family History: The patient's family history includes CAD in her brother; Diabetes in her maternal grandmother; Heart disease in her father and mother; Heart disease (age of onset: 33) in her sister; Hyperlipidemia in her father; Hypertension in her father; Hypothyroidism in her father and mother; Macular degeneration in her mother; Pulmonary fibrosis in her brother; Raynaud syndrome in her sister; Rheum arthritis in her brother and sister; Thyroid disease in her sister and sister. There is no history of Breast cancer, Ovarian cancer, Prostate cancer, Colon cancer, Stomach cancer, or Esophageal  cancer.  ROS:   Please see the history of present illness.    All other systems reviewed and are negative.  EKGs/Labs/Other Studies Reviewed:  The following studies were reviewed today: I discussed my findings with the patient at length.  EKG revealed sinus rhythm and nonspecific ST-T changes   Recent Labs: 01/18/2022: Hemoglobin 13.6; Platelets 276 04/18/2022: ALT 37; BUN 12; Creatinine, Ser 0.79; Potassium 4.6; Sodium 139; TSH 2.880  Recent Lipid Panel    Component Value Date/Time   CHOL 143 04/18/2022 0924   TRIG 202 (H) 04/18/2022 0924   HDL 41 04/18/2022 0924   CHOLHDL 3.5 04/18/2022 0924   CHOLHDL 3.3 Ratio 08/05/2009 2054   VLDL 21 08/05/2009 2054   LDLCALC 68 04/18/2022 0924    Physical Exam:    VS:  BP 124/78   Pulse 73   Ht 5' 3.5" (1.613 m)   Wt 153 lb (69.4 kg)   SpO2 95%   BMI 26.68 kg/m     Wt Readings from Last 3 Encounters:  08/03/22 153 lb (69.4 kg)  07/07/22 155 lb 9.6 oz (70.6 kg)  06/08/22 154 lb 6 oz (70 kg)     GEN: Patient is in no acute distress HEENT: Normal NECK: No JVD; No carotid bruits LYMPHATICS: No lymphadenopathy CARDIAC: Hear sounds regular, 2/6 systolic murmur at the apex. RESPIRATORY:  Clear to auscultation without rales, wheezing or rhonchi  ABDOMEN: Soft, non-tender, non-distended MUSCULOSKELETAL:  No edema; No deformity  SKIN: Warm and dry NEUROLOGIC:  Alert and oriented x 3 PSYCHIATRIC:  Normal affect   Signed, Stacy Lindau, [name]  08/03/2022 1:20 PM    [facility]

## 2022-08-03 NOTE — Patient Instructions (Signed)
Medication Instructions:  Your physician recommends that you continue on your current medications as directed. Please refer to the Current Medication list given to you today.  *If you need a refill on your cardiac medications before your next appointment, please call your pharmacy*   Lab Work: None ordered If you have labs (blood work) drawn today and your tests are completely normal, you will receive your results only by: [place] (if you have MyChart) OR A paper copy in the mail If you have any lab test that is abnormal or we need to change your treatment, we will call you to review the results.   Testing/Procedures: Your physician has requested that you have an echocardiogram. Echocardiography is a painless test that uses sound waves to create images of your heart. It provides your doctor with information about the size and shape of your heart and how well your heart's chambers and valves are working. This procedure takes approximately one hour. There are no restrictions for this procedure.   WHY IS MY DOCTOR PRESCRIBING ZIO? The Zio system is proven and trusted by physicians to detect and diagnose irregular heart rhythms -- and has been prescribed to hundreds of thousands of patients.  The FDA has cleared the Zio system to monitor for many different kinds of irregular heart rhythms. In a study, physicians were able to reach a diagnosis 90% of the time with the Zio system1.  You can wear the Zio monitor -- a small, discreet, comfortable patch -- during your normal day-to-day activity, including while you sleep, shower, and exercise, while it records every single heartbeat for analysis.  1Barrett, P., et al. Comparison of 24 Hour Holter Monitoring Versus 14 Day Novel Adhesive Patch Electrocardiographic Monitoring. [place], 2014.  ZIO VS. HOLTER MONITORING The Zio monitor can be comfortably worn for up to 14 days. Holter monitors can be worn for 24 to 48  hours, limiting the time to record any irregular heart rhythms you may have. Zio is able to capture data for the 51% of patients who have their first symptom-triggered arrhythmia after 48 hours.1  LIVE WITHOUT RESTRICTIONS The Zio ambulatory cardiac monitor is a small, unobtrusive, and water-resistant patch--you might even forget you're wearing it. The Zio monitor records and stores every beat of your heart, whether you're sleeping, working out, or showering. Wear the monitor for 14 days.    Follow-Up: At [facility], you and your health needs are our priority.  As part of our continuing mission to provide you with exceptional heart care, we have created designated Provider Care Teams.  These Care Teams include your primary Cardiologist (physician) and Advanced Practice Providers (APPs -  Physician Assistants and Nurse Practitioners) who all work together to provide you with the care you need, when you need it.  We recommend signing up for the patient portal called "MyChart".  Sign up information is provided on this After Visit Summary.  MyChart is used to connect with patients for Virtual Visits (Telemedicine).  Patients are able to view lab/test results, encounter notes, upcoming appointments, etc.  Non-urgent messages can be sent to your provider as well.   To learn more about what you can do with MyChart, go to [email or web address].    Your next appointment:   6 month(s)  The format for your next appointment:   In Person  Provider:   Jyl Heinz, MD   Other Instructions Echocardiogram An echocardiogram is a test that uses sound waves (ultrasound) to produce images of  the heart. Images from an echocardiogram can provide important information about: Heart size and shape. The size and thickness and movement of your heart's walls. Heart muscle function and strength. Heart valve function or if you have stenosis. Stenosis is when the heart valves are too narrow. If blood is  flowing backward through the heart valves (regurgitation). A tumor or infectious growth around the heart valves. Areas of heart muscle that are not working well because of poor blood flow or injury from a heart attack. Aneurysm detection. An aneurysm is a weak or damaged part of an artery wall. The wall bulges out from the normal force of blood pumping through the body. Tell a health care provider about: Any allergies you have. All medicines you are taking, including vitamins, herbs, eye drops, creams, and over-the-counter medicines. Any blood disorders you have. Any surgeries you have had. Any medical conditions you have. Whether you are pregnant or may be pregnant. What are the risks? Generally, this is a safe test. However, problems may occur, including an allergic reaction to dye (contrast) that may be used during the test. What happens before the test? No specific preparation is needed. You may eat and drink normally. What happens during the test? You will take off your clothes from the waist up and put on a hospital gown. Electrodes or electrocardiogram (ECG)patches may be placed on your chest. The electrodes or patches are then connected to a device that monitors your heart rate and rhythm. You will lie down on a table for an ultrasound exam. A gel will be applied to your chest to help sound waves pass through your skin. A handheld device, called a transducer, will be pressed against your chest and moved over your heart. The transducer produces sound waves that travel to your heart and bounce back (or "echo" back) to the transducer. These sound waves will be captured in real-time and changed into images of your heart that can be viewed on a video monitor. The images will be recorded on a computer and reviewed by your health care provider. You may be asked to change positions or hold your breath for a short time. This makes it easier to get different views or better views of your heart. In  some cases, you may receive contrast through an IV in one of your veins. This can improve the quality of the pictures from your heart. The procedure may vary among health care providers and hospitals.   What can I expect after the test? You may return to your normal, everyday life, including diet, activities, and medicines, unless your health care provider tells you not to do that. Follow these instructions at home: It is up to you to get the results of your test. Ask your health care provider, or the department that is doing the test, when your results will be ready. Keep all follow-up visits. This is important. Summary An echocardiogram is a test that uses sound waves (ultrasound) to produce images of the heart. Images from an echocardiogram can provide important information about the size and shape of your heart, heart muscle function, heart valve function, and other possible heart problems. You do not need to do anything to prepare before this test. You may eat and drink normally. After the echocardiogram is completed, you may return to your normal, everyday life, unless your health care provider tells you not to do that. This information is not intended to replace advice given to you by your health care provider. Make  sure you discuss any questions you have with your health care provider. Document Revised: 06/02/2020 Document Reviewed: 06/02/2020 Elsevier Patient Education  2021 [employer].

## 2022-08-03 NOTE — Addendum Note (Signed)
Addended by: Truddie Hidden on: 08/03/2022 07:35 PM   Modules accepted: Orders

## 2022-08-04 LAB — BASIC METABOLIC PANEL
BUN/Creatinine Ratio: 13 (ref 12–28)
BUN: 11 mg/dL (ref 8–27)
CO2: 24 mmol/L (ref 20–29)
Calcium: 10.2 mg/dL (ref 8.7–10.3)
Chloride: 102 mmol/L (ref 96–106)
Creatinine, Ser: 0.87 mg/dL (ref 0.57–1.00)
Glucose: 118 mg/dL — ABNORMAL HIGH (ref 70–99)
Potassium: 4.2 mmol/L (ref 3.5–5.2)
Sodium: 144 mmol/L (ref 134–144)
eGFR: 74 mL/min/{1.73_m2} (ref >59)

## 2022-08-04 LAB — LIPID PANEL
Chol/HDL Ratio: 2.8 ratio (ref 0.0–4.4)
Cholesterol, Total: 130 mg/dL (ref 100–199)
HDL: 47 mg/dL (ref >39)
LDL Chol Calc (NIH): 53 mg/dL (ref 0–99)
Triglycerides: 184 mg/dL — ABNORMAL HIGH (ref 0–149)
VLDL Cholesterol Cal: 30 mg/dL (ref 5–40)

## 2022-08-04 LAB — HEPATIC FUNCTION PANEL
ALT: 63 IU/L — ABNORMAL HIGH (ref 0–32)
AST: 48 IU/L — ABNORMAL HIGH (ref 0–40)
Albumin: 4.7 g/dL (ref 3.9–4.9)
Alkaline Phosphatase: 111 IU/L (ref 44–121)
Bilirubin Total: 0.6 mg/dL (ref 0.0–1.2)
Bilirubin, Direct: 0.18 mg/dL (ref 0.00–0.40)
Total Protein: 7.4 g/dL (ref 6.0–8.5)

## 2022-08-04 LAB — CBC
Hematocrit: 46.1 % (ref 34.0–46.6)
Hemoglobin: 15.3 g/dL (ref 11.1–15.9)
MCH: 31.4 pg (ref 26.6–33.0)
MCHC: 33.2 g/dL (ref 31.5–35.7)
MCV: 95 fL (ref 79–97)
Platelets: 332 10*3/uL (ref 150–450)
RBC: 4.87 x10E6/uL (ref 3.77–5.28)
RDW: 12.5 % (ref 11.7–15.4)
WBC: 9.4 10*3/uL (ref 3.4–10.8)

## 2022-08-04 LAB — TSH: TSH: 2.7 u[IU]/mL (ref 0.450–4.500)

## 2022-08-05 DIAGNOSIS — E782 Mixed hyperlipidemia: Secondary | ICD-10-CM | POA: Diagnosis not present

## 2022-08-05 DIAGNOSIS — Z882 Allergy status to sulfonamides status: Secondary | ICD-10-CM | POA: Diagnosis not present

## 2022-08-05 DIAGNOSIS — I2510 Atherosclerotic heart disease of native coronary artery without angina pectoris: Secondary | ICD-10-CM | POA: Diagnosis not present

## 2022-08-05 DIAGNOSIS — Z881 Allergy status to other antibiotic agents status: Secondary | ICD-10-CM | POA: Diagnosis not present

## 2022-08-05 DIAGNOSIS — R0600 Dyspnea, unspecified: Secondary | ICD-10-CM | POA: Diagnosis not present

## 2022-08-05 DIAGNOSIS — R002 Palpitations: Secondary | ICD-10-CM | POA: Diagnosis not present

## 2022-08-05 DIAGNOSIS — I2584 Coronary atherosclerosis due to calcified coronary lesion: Secondary | ICD-10-CM | POA: Diagnosis not present

## 2022-08-05 DIAGNOSIS — Z888 Allergy status to other drugs, medicaments and biological substances status: Secondary | ICD-10-CM | POA: Diagnosis not present

## 2022-08-06 DIAGNOSIS — R002 Palpitations: Secondary | ICD-10-CM | POA: Diagnosis not present

## 2022-08-08 ENCOUNTER — Ambulatory Visit: Payer: Medicare Other | Admitting: Adult Health

## 2022-08-08 ENCOUNTER — Encounter: Payer: Self-pay | Admitting: Adult Health

## 2022-08-08 ENCOUNTER — Ambulatory Visit (INDEPENDENT_AMBULATORY_CARE_PROVIDER_SITE_OTHER): Payer: Medicare Other

## 2022-08-08 VITALS — BP 106/76 | HR 98 | Temp 97.8°F | Ht 63.5 in | Wt 153.4 lb

## 2022-08-08 DIAGNOSIS — J9611 Chronic respiratory failure with hypoxia: Secondary | ICD-10-CM

## 2022-08-08 DIAGNOSIS — R002 Palpitations: Secondary | ICD-10-CM | POA: Diagnosis not present

## 2022-08-08 DIAGNOSIS — J449 Chronic obstructive pulmonary disease, unspecified: Secondary | ICD-10-CM

## 2022-08-08 DIAGNOSIS — J9621 Acute and chronic respiratory failure with hypoxia: Secondary | ICD-10-CM | POA: Insufficient documentation

## 2022-08-08 NOTE — Assessment & Plan Note (Signed)
Continue on oxygen 2 L with activity to maintain O2 saturations greater than 88 to 90%

## 2022-08-08 NOTE — Progress Notes (Signed)
Received flu vaccine on 08/06/2022 at [facility].

## 2022-08-08 NOTE — Progress Notes (Signed)
$'@Patient'n$  ID: Stacy [name], female    DOB: 1956/02/12, 66 y.o.   MRN: [id number]  Chief Complaint  Patient presents with   Follow-up    Referring provider: [name], DO  HPI: 66 year old female former smoker followed for severe COPD, chronic cough and chronic rhinitis, chronic respiratory failure on oxygen with activity  TEST/EVENTS :   08/08/2022 Follow up : COPD  Patient returns for 1 month follow-up.  Patient was seen last visit with complaints of increased shortness of breath and intermittent tachycardia.  She was referred to cardiology.  She has been set up for a Zio patch which has recently been placed in the last few days.  She also has an upcoming echo this week.  Patient says she is on oxygen 2 L with activity but very seldom has to use this.  She denies any increased cough or congestion.  Remains on Anoro daily.  She denies any fever or discolored mucus .   Allergies  Allergen Reactions   Avelox [Moxifloxacin Hcl In Nacl] Hives   Ciprofloxacin     REACTION: Rash   Livalo [Pitavastatin]    Metformin And Related     kidney   Moxifloxacin     REACTION: RASH   Bactrim [Sulfamethoxazole-Trimethoprim] Rash   Statins Other (See Comments)    myalgias   Sulfamethoxazole-Trimethoprim Rash    REACTION: Rash    Immunization History  Administered Date(s) Administered   H1N1 10/06/2008   Influenza Inj Mdck Quad With Preservative 08/06/2018   Influenza Split 08/25/2017, 08/06/2018, 07/23/2019   Influenza Whole 08/01/2007, 08/04/2009   Influenza,inj,Quad PF,6+ Mos 07/14/2016, 08/25/2017, 08/02/2021   Influenza-Unspecified 08/25/2017, 08/06/2018, 07/23/2019, 08/24/2020   Moderna Sars-Covid-2 Vaccination 03/31/2021, 09/07/2021   PFIZER(Purple Top)SARS-COV-2 Vaccination 01/08/2020, 01/29/2020, 08/24/2020   Pneumococcal Conjugate-13 06/08/2018   Pneumococcal Polysaccharide-23 08/01/2007   Td 08/01/2007   Tdap 01/23/2018    Past Medical History:  Diagnosis Date    Allergic rhinitis 02/25/2008   Qualifier: Diagnosis of  By: Valetta Close DO, Karen     Angina pectoris ([place]) 06/09/2021   Aortic atherosclerosis ([place]) 08/14/2017   Appreciated on CXR 08/14/2017   Asthma    Asthmatic bronchitis 12/07/2021   Atrophy of vagina 12/07/2021   Centrilobular emphysema ([place]) 05/28/2018   Controlled diabetes mellitus type 2 with complications ([place]) 55/97/4163   Has FLD.   COPD Gold C 11/02/2007   Formatting of this note might be different from the original. PFT 05/28/2019           ratio 50% FEV1 53% FVC 83%    DLCO 54% PFT 09/25/18 ([facility]) ratio 52% FEV1 56% FVC 108%; DLCO 69%   Coronary artery disease    Dysphagia 12/07/2021   Dyspnea on exertion 08/21/2018   Essential hypertension 12/07/2021   Facet arthropathy, lumbar 12/15/2021   Family history of osteoporosis 04/22/2019   Family history of rheumatoid arthritis 06/05/2019   Fatty liver disease, nonalcoholic    Ganglion of hand 12/07/2021   GERD 11/02/2007   Qualifier: Diagnosis of  By: Valetta Close DO, Karen     Hemorrhoids 12/07/2021   Hyperlipidemia associated with type 2 diabetes mellitus ([place]) 03/24/2020   Hypersomnia with sleep apnea 05/28/2018   HYPERTENSION, BENIGN 12/24/2008   Qualifier: Diagnosis of  By: Stanford Breed, MD, Kandyce Rud    Hypothyroidism 06/13/2007   ? Hashimotos.  Has strong family hx autoimmune d/o No h/o thyroidectomy or radioablation.    IBS (irritable bowel syndrome)    Lateral epicondylitis of left elbow  06/05/2019   Lung disease, bullous ([place]) 08/14/2017   Menopause 12/07/2021   Obesity (BMI 30.0-34.9) 05/28/2018   Osteopenia of multiple sites 12/15/2021   Overactive bladder 06/09/2017   Pain in female genitalia on intercourse 12/07/2021   Polyp of colon 12/07/2021   Post-menopausal 04/22/2019   Precordial pain    Sees Dr Stanford Breed.  Last cardiac cath 2016 = negative. Strong family h/o MI and CAD   Psychophysiological insomnia 05/28/2018   Reactive depression 06/26/2018    Rosacea 08/01/2007   Qualifier: Diagnosis of  By: Valetta Close DO, Karen     Snoring 05/28/2018   Stage 2 moderate COPD by GOLD classification ([place]) 11/02/2007   Formatting of this note might be different from the original. PFT 05/28/2019           ratio 50% FEV1 53% FVC 83%    DLCO 54% PFT 09/25/18 ([facility]) ratio 52% FEV1 56% FVC 108%; DLCO 69%   Statin intolerance 04/10/2019   Stress incontinence in female 06/09/2017   UTI'S, RECURRENT 06/13/2007   Qualifier: Diagnosis of  By: Valetta Close DO, Santiago Glad     Vitamin D insufficiency 12/15/2021    Tobacco History: Social History   Tobacco Use  Smoking Status Former   Packs/day: 0.50   Years: 30.00   Total pack years: 15.00   Types: Cigarettes   Quit date: 10/25/2007   Years since quitting: 14.7  Smokeless Tobacco Never   Counseling given: Not Answered   Outpatient Medications Prior to Visit  Medication Sig Dispense Refill   albuterol (PROVENTIL) (2.5 MG/3ML) 0.083% nebulizer solution Take 3 mLs (2.5 mg total) by nebulization every 6 (six) hours as needed for wheezing or shortness of breath. 75 mL 0   albuterol (VENTOLIN HFA) 108 (90 Base) MCG/ACT inhaler Inhale 2 puffs into the lungs every 6 (six) hours as needed for wheezing or shortness of breath. 16 g 1   Evolocumab (REPATHA) 140 MG/ML SOSY Inject 140 mg into the skin every 14 (fourteen) days. 6 mL 0   furosemide (LASIX) 20 MG tablet Take 20 mg by mouth daily.     levothyroxine (SYNTHROID) 75 MCG tablet Take 1 tablet (75 mcg total) by mouth daily. 100 tablet 3   loratadine (CLARITIN) 10 MG tablet Take 10 mg by mouth daily.     methocarbamol (ROBAXIN) 500 MG tablet Take 1 tablet (500 mg total) by mouth 2 (two) times daily. 20 tablet 0   metoprolol succinate (TOPROL XL) 25 MG 24 hr tablet Take 1 tablet (25 mg total) by mouth daily. 90 tablet 3   umeclidinium-vilanterol (ANORO ELLIPTA) 62.5-25 MCG/ACT AEPB Inhale 1 puff into the lungs daily. 180 each 3   Vitamin D, Ergocalciferol, (DRISDOL) 1.25 MG  (50000 UNIT) CAPS capsule Take 1 capsule (50,000 Units total) by mouth every 7 (seven) days. 12 capsule 3   No facility-administered medications prior to visit.     Review of Systems:   Constitutional:   No  weight loss, night sweats,  Fevers, chills, + fatigue, or  lassitude.  HEENT:   No headaches,  Difficulty swallowing,  Tooth/dental problems, or  Sore throat,                No sneezing, itching, ear ache,  +nasal congestion, post nasal drip,   CV:  No chest pain,  Orthopnea, PND, swelling in lower extremities, anasarca, dizziness, palpitations, syncope.   GI  No heartburn, indigestion, abdominal pain, nausea, vomiting, diarrhea, change in bowel habits, loss of appetite, bloody stools.  Resp:  No chest wall deformity  Skin: no rash or lesions.  GU: no dysuria, change in color of urine, no urgency or frequency.  No flank pain, no hematuria   MS:  No joint pain or swelling.  No decreased range of motion.  No back pain.    Physical Exam  BP 106/76 (BP Location: Left Arm, Patient Position: Sitting, Cuff Size: Normal)   Pulse 98   Temp 97.8 F (36.6 C) (Oral)   Ht 5' 3.5" (1.613 m)   Wt 153 lb 6.4 oz (69.6 kg)   SpO2 96%   BMI 26.75 kg/m   GEN: A/Ox3; pleasant , NAD, well nourished    HEENT:  [place]/AT,   NOSE-clear, THROAT-clear, no lesions, no postnasal drip or exudate noted.   NECK:  Supple w/ fair ROM; no JVD; normal carotid impulses w/o bruits; no thyromegaly or nodules palpated; no lymphadenopathy.    RESP  Clear  P & A; w/o, wheezes/ rales/ or rhonchi. no accessory muscle use, no dullness to percussion  CARD:  RRR, no m/r/g, no peripheral edema, pulses intact, no cyanosis or clubbing.  GI:   Soft & nt; nml bowel sounds; no organomegaly or masses detected.   Musco: Warm bil, no deformities or joint swelling noted.   Neuro: alert, no focal deficits noted.    Skin: Warm, no lesions or rashes    Lab Results:  CBC    BNP   ProBNP No results found  for: "PROBNP"  Imaging: No results found.       Latest Ref Rng & Units 03/01/2022    9:47 AM 09/28/2017   11:53 AM  PFT Results  FVC-Pre L 2.25  1.98   FVC-Predicted Pre % 72  61   FVC-Post L 2.35  2.34   FVC-Predicted Post % 75  72   Pre FEV1/FVC % % 50  54   Post FEV1/FCV % % 52  51   FEV1-Pre L 1.13  1.06   FEV1-Predicted Pre % 47  43   FEV1-Post L 1.21  1.18   DLCO uncorrected ml/min/mmHg 12.77  15.64   DLCO UNC% % 66  68   DLCO corrected ml/min/mmHg 12.70  15.50   DLCO COR %Predicted % 65  67   DLVA Predicted % 90  85   TLC L 5.40  5.90   TLC % Predicted % 110  120   RV % Predicted % 142  176     No results found for: "NITRICOXIDE"      Assessment & Plan:   COPD Gold C Severe COPD continue on Anoro.  Check chest x-ray today.  Plan  Patient Instructions  Chest xray today  Continue on ANORO 1 puff daily  rinse after use.  Activity as tolerated.  Continue on Oxygen 2l/ with activity , to keep O2 sats >88-90%.  Follow up with cardiology as planned and As needed   Follow up with Dr. Lamonte Sakai  3 months and As needed        Chronic respiratory failure with hypoxia ([place]) Continue on oxygen 2 L with activity to maintain O2 saturations greater than 88 to 90%  Palpitations Palpitations and tachycardia.  Continue follow-up with cardiology.     Rexene Edison, NP 08/08/2022

## 2022-08-08 NOTE — Assessment & Plan Note (Signed)
Palpitations and tachycardia.  Continue follow-up with cardiology.

## 2022-08-08 NOTE — Assessment & Plan Note (Signed)
Severe COPD continue on Anoro.  Check chest x-ray today.  Plan  Patient Instructions  Chest xray today  Continue on ANORO 1 puff daily  rinse after use.  Activity as tolerated.  Continue on Oxygen 2l/ with activity , to keep O2 sats >88-90%.  Follow up with cardiology as planned and As needed   Follow up with Dr. Lamonte Sakai  3 months and As needed

## 2022-08-08 NOTE — Patient Instructions (Signed)
Chest xray today  Continue on ANORO 1 puff daily  rinse after use.  Activity as tolerated.  Continue on Oxygen 2l/ with activity , to keep O2 sats >88-90%.  Follow up with cardiology as planned and As needed   Follow up with Dr. Lamonte Sakai  3 months and As needed

## 2022-08-09 DIAGNOSIS — R0902 Hypoxemia: Secondary | ICD-10-CM | POA: Diagnosis not present

## 2022-08-10 DIAGNOSIS — R040 Epistaxis: Secondary | ICD-10-CM | POA: Diagnosis not present

## 2022-08-10 DIAGNOSIS — J343 Hypertrophy of nasal turbinates: Secondary | ICD-10-CM | POA: Diagnosis not present

## 2022-08-10 DIAGNOSIS — J329 Chronic sinusitis, unspecified: Secondary | ICD-10-CM | POA: Diagnosis not present

## 2022-08-10 DIAGNOSIS — D140 Benign neoplasm of middle ear, nasal cavity and accessory sinuses: Secondary | ICD-10-CM | POA: Diagnosis not present

## 2022-08-12 ENCOUNTER — Ambulatory Visit (HOSPITAL_BASED_OUTPATIENT_CLINIC_OR_DEPARTMENT_OTHER)
Admission: RE | Admit: 2022-08-12 | Discharge: 2022-08-12 | Disposition: A | Discharge status: Home / Self Care | Payer: Medicare Other | Source: Ambulatory Visit | Attending: Cardiology | Admitting: Cardiology

## 2022-08-12 DIAGNOSIS — R0602 Shortness of breath: Secondary | ICD-10-CM | POA: Insufficient documentation

## 2022-08-12 DIAGNOSIS — I2510 Atherosclerotic heart disease of native coronary artery without angina pectoris: Secondary | ICD-10-CM | POA: Diagnosis not present

## 2022-08-12 NOTE — Progress Notes (Signed)
  Echocardiogram 2D Echocardiogram has been performed.  Stacy [name] 08/12/2022, 9:58 AM

## 2022-08-14 LAB — ECHOCARDIOGRAM COMPLETE
Area-P 1/2: 3.43 cm2
Calc EF: 55.2 %
MV M vel: 2.49 m/s
MV Peak grad: 24.7 mmHg
S' Lateral: 2.6 cm
Single Plane A2C EF: 57.7 %
Single Plane A4C EF: 50.9 %

## 2022-08-15 NOTE — Progress Notes (Signed)
ATC x1.  No answer.  LVM to return call.

## 2022-08-17 ENCOUNTER — Other Ambulatory Visit: Payer: Self-pay | Admitting: Family Medicine

## 2022-08-17 DIAGNOSIS — K760 Fatty (change of) liver, not elsewhere classified: Secondary | ICD-10-CM

## 2022-08-17 DIAGNOSIS — R7989 Other specified abnormal findings of blood chemistry: Secondary | ICD-10-CM

## 2022-08-26 ENCOUNTER — Telehealth: Payer: Self-pay | Admitting: Internal Medicine

## 2022-08-26 DIAGNOSIS — H35033 Hypertensive retinopathy, bilateral: Secondary | ICD-10-CM | POA: Diagnosis not present

## 2022-08-26 DIAGNOSIS — E119 Type 2 diabetes mellitus without complications: Secondary | ICD-10-CM | POA: Diagnosis not present

## 2022-08-26 DIAGNOSIS — I10 Essential (primary) hypertension: Secondary | ICD-10-CM | POA: Diagnosis not present

## 2022-08-26 DIAGNOSIS — H25813 Combined forms of age-related cataract, bilateral: Secondary | ICD-10-CM | POA: Diagnosis not present

## 2022-08-26 DIAGNOSIS — R002 Palpitations: Secondary | ICD-10-CM | POA: Diagnosis not present

## 2022-08-26 LAB — HM DIABETES EYE EXAM

## 2022-08-26 NOTE — Telephone Encounter (Signed)
Good Afternoon Dr.Perry,  We received a referral on this patient to be seen for fatty liver diseases and elevated LFT. She has GI hx at [facility]. She has requested you as her physician.  We have records for review. Please advise on scheduling, thank you.

## 2022-08-31 ENCOUNTER — Other Ambulatory Visit: Payer: Medicare Other

## 2022-08-31 DIAGNOSIS — R7989 Other specified abnormal findings of blood chemistry: Secondary | ICD-10-CM | POA: Diagnosis not present

## 2022-09-01 LAB — ACUTE HEP PANEL AND HEP B SURFACE AB
Hep A IgM: NEGATIVE
Hep B C IgM: NEGATIVE
Hep C Virus Ab: NONREACTIVE
Hepatitis B Surf Ab Quant: <3.1 m[IU]/mL — ABNORMAL LOW (ref >9.9)
Hepatitis B Surface Ag: NEGATIVE

## 2022-09-01 LAB — HEPATIC FUNCTION PANEL
ALT: 54 IU/L — ABNORMAL HIGH (ref 0–32)
AST: 44 IU/L — ABNORMAL HIGH (ref 0–40)
Albumin: 4.2 g/dL (ref 3.9–4.9)
Alkaline Phosphatase: 97 IU/L (ref 44–121)
Bilirubin Total: 0.5 mg/dL (ref 0.0–1.2)
Bilirubin, Direct: 0.16 mg/dL (ref 0.00–0.40)
Total Protein: 6.5 g/dL (ref 6.0–8.5)

## 2022-09-02 NOTE — Progress Notes (Signed)
Patient returning call. Please call back

## 2022-09-05 ENCOUNTER — Telehealth: Payer: Self-pay | Admitting: Family Medicine

## 2022-09-05 NOTE — Telephone Encounter (Signed)
Pt aware.

## 2022-09-05 NOTE — Telephone Encounter (Signed)
Pt aware of labs- wants me to let you know she seen ent in [place] and they have diagnosed her with squamous carcinoma cells in her nasal passages

## 2022-09-07 ENCOUNTER — Telehealth: Payer: Self-pay | Admitting: Internal Medicine

## 2022-09-07 DIAGNOSIS — D140 Benign neoplasm of middle ear, nasal cavity and accessory sinuses: Secondary | ICD-10-CM | POA: Diagnosis not present

## 2022-09-07 DIAGNOSIS — J3489 Other specified disorders of nose and nasal sinuses: Secondary | ICD-10-CM | POA: Diagnosis not present

## 2022-09-07 DIAGNOSIS — R930 Abnormal findings on diagnostic imaging of skull and head, not elsewhere classified: Secondary | ICD-10-CM | POA: Diagnosis not present

## 2022-09-07 DIAGNOSIS — J342 Deviated nasal septum: Secondary | ICD-10-CM | POA: Diagnosis not present

## 2022-09-07 DIAGNOSIS — G501 Atypical facial pain: Secondary | ICD-10-CM | POA: Diagnosis not present

## 2022-09-07 NOTE — Telephone Encounter (Signed)
Record review and request to be seen  Ander Purpura, You sent me a request to review records regarding this patient being seen.  I am unable to accommodate this request due to the high patient demand of our practice and the busy nature of my practice.  This patient has an established gastroenterologist in town and has been seen as recently as this year.  My recommendation is that she return to her current gastroenterology practice.  Sorry that I could not accommodate this request.  Docia Chuck. Geri [place]in[place]., M.D. [facility] Division of Gastroenterology

## 2022-09-08 NOTE — Telephone Encounter (Signed)
LVM for patient regarding Dr.Perrys decision.

## 2022-09-09 DIAGNOSIS — R0902 Hypoxemia: Secondary | ICD-10-CM | POA: Diagnosis not present

## 2022-09-20 ENCOUNTER — Encounter: Payer: Self-pay | Admitting: Family Medicine

## 2022-09-20 ENCOUNTER — Ambulatory Visit (INDEPENDENT_AMBULATORY_CARE_PROVIDER_SITE_OTHER): Payer: Medicare Other | Admitting: Family Medicine

## 2022-09-20 VITALS — BP 132/83 | HR 77 | Temp 97.3°F | Ht 63.5 in | Wt 153.4 lb

## 2022-09-20 DIAGNOSIS — J0110 Acute frontal sinusitis, unspecified: Secondary | ICD-10-CM | POA: Diagnosis not present

## 2022-09-20 MED ORDER — AMOXICILLIN-POT CLAVULANATE 875-125 MG PO TABS: 1.0000 | ORAL_TABLET | Freq: Two times a day (BID) | ORAL | 0 refills | Status: DC

## 2022-09-20 NOTE — Progress Notes (Signed)
I have separately seen and examined the patient. I have discussed the findings and exam with student Dr Jerline Pain and agree with the below note.  My changes/additions are outlined in BLUE.    S: Patient reports that she has been having a couple day history of left-sided sinus pressure.  She points to her maxillary sinus.  She has a lesion that is due to be excised from this region with her ENT soon.  She has been trying to irrigate her sinuses for presumed sinusitis but that seem to make things a little worse and now she has subsequent fullness.  Has Flonase at home but is not utilizing the medication.  She really wants to try to avoid any antibiotics if possible because she has history of C. difficile colitis.  She does not report any purulence from nares, fevers.  Recovered from COVID-19 last month and this is a separate issue from that illness.  O: Vitals:   09/20/22 1041  BP: 132/83  Pulse: 77  Temp: (!) 97.3 F (36.3 C)  SpO2: 95%    HEENT: TMs intact bilaterally.  Left nare with narrowing, clear mucus.  Right nare open.  Moist mucous membranes.  No facial swelling.  A/P:  Acute frontal sinusitis, recurrence not specified - Plan: amoxicillin-clavulanate (AUGMENTIN) 875-125 MG tablet  Pocket prescription for Augmentin given but I am not convinced this is a sinus infection at this time.  Given her risk of colitis I would highly recommend that she hold off on this unless clinical need arises.  She will utilize Flonase nasal spray in that left nare as well as Afrin if needed.  Caution use no more than 3 days consecutively so as to prevent rebound.  I will CC her note to ENT as Bobetta Lime M. Lajuana Ripple, [facility]   -------------------------------------------------------------------------------------------------------------------------------------------------------------------------------------      Subjective: CC: Sinus pain PCP: [name],  DO QJJ:HERDE A Batta is a 66 y.o. female presenting to clinic today for:  Sinus pain Ms. Yurick is a 66 year old female with a history of COPD and a recently diagnosed nasal squamous cell papilloma who presents to the clinic following 2 days of increased intense sinus pressure after using a neti pot. Ms. Califf is currently seeing [facility] for the management of her squamous cell papilloma, with surgery scheduled for December 29. Following the use of a neti pot over the weekend, she feels increased pressure in her face and ears. She has had clear sinus drainage, Denies purulent drainage, sinus pain, or fevers. She denies pain in her teeth. There is no shortness of breath, wheezing, fatigue, body aches, periorbital edema, vision changes, or altered mental status. The patient has not utilized any nasal spray.  ROS: Per HPI  Allergies  Allergen Reactions   Avelox [Moxifloxacin Hcl In Nacl] Hives   Ciprofloxacin     REACTION: Rash   Livalo [Pitavastatin]    Metformin And Related     kidney   Moxifloxacin     REACTION: RASH   Bactrim [Sulfamethoxazole-Trimethoprim] Rash   Statins Other (See Comments)    myalgias   Sulfamethoxazole-Trimethoprim Rash    REACTION: Rash   Past Medical History:  Diagnosis Date   Allergic rhinitis 02/25/2008   Qualifier: Diagnosis of  By: Valetta Close DO, Karen     Angina pectoris ([place]) 06/09/2021   Aortic atherosclerosis ([place]) 08/14/2017   Appreciated on CXR 08/14/2017   Asthma    Asthmatic bronchitis 12/07/2021   Atrophy of vagina  12/07/2021   Centrilobular emphysema ([place]) 05/28/2018   Controlled diabetes mellitus type 2 with complications ([place]) 16/94/5038   Has FLD.   COPD Gold C 11/02/2007   Formatting of this note might be different from the original. PFT 05/28/2019           ratio 50% FEV1 53% FVC 83%    DLCO 54% PFT 09/25/18 ([facility]) ratio 52% FEV1 56% FVC 108%; DLCO 69%   Coronary artery disease    Dysphagia 12/07/2021   Dyspnea on exertion 08/21/2018    Essential hypertension 12/07/2021   Facet arthropathy, lumbar 12/15/2021   Family history of osteoporosis 04/22/2019   Family history of rheumatoid arthritis 06/05/2019   Fatty liver disease, nonalcoholic    Ganglion of hand 12/07/2021   GERD 11/02/2007   Qualifier: Diagnosis of  By: Valetta Close DO, Karen     Hemorrhoids 12/07/2021   Hyperlipidemia associated with type 2 diabetes mellitus ([place]) 03/24/2020   Hypersomnia with sleep apnea 05/28/2018   HYPERTENSION, BENIGN 12/24/2008   Qualifier: Diagnosis of  By: Stanford Breed, MD, Kandyce Rud    Hypothyroidism 06/13/2007   ? Hashimotos.  Has strong family hx autoimmune d/o No h/o thyroidectomy or radioablation.    IBS (irritable bowel syndrome)    Lateral epicondylitis of left elbow 06/05/2019   Lung disease, bullous ([place]) 08/14/2017   Menopause 12/07/2021   Obesity (BMI 30.0-34.9) 05/28/2018   Osteopenia of multiple sites 12/15/2021   Overactive bladder 06/09/2017   Pain in female genitalia on intercourse 12/07/2021   Polyp of colon 12/07/2021   Post-menopausal 04/22/2019   Precordial pain    Sees Dr Stanford Breed.  Last cardiac cath 2016 = negative. Strong family h/o MI and CAD   Psychophysiological insomnia 05/28/2018   Reactive depression 06/26/2018   Rosacea 08/01/2007   Qualifier: Diagnosis of  By: Valetta Close DO, Karen     Snoring 05/28/2018   Stage 2 moderate COPD by GOLD classification ([place]) 11/02/2007   Formatting of this note might be different from the original. PFT 05/28/2019           ratio 50% FEV1 53% FVC 83%    DLCO 54% PFT 09/25/18 ([facility]) ratio 52% FEV1 56% FVC 108%; DLCO 69%   Statin intolerance 04/10/2019   Stress incontinence in female 06/09/2017   UTI'S, RECURRENT 06/13/2007   Qualifier: Diagnosis of  By: Esmeralda Arthur     Vitamin D insufficiency 12/15/2021    Current Outpatient Medications:    albuterol (PROVENTIL) (2.5 MG/3ML) 0.083% nebulizer solution, Take 3 mLs (2.5 mg total) by nebulization every 6 (six) hours as  needed for wheezing or shortness of breath., Disp: 75 mL, Rfl: 0   albuterol (VENTOLIN HFA) 108 (90 Base) MCG/ACT inhaler, Inhale 2 puffs into the lungs every 6 (six) hours as needed for wheezing or shortness of breath., Disp: 16 g, Rfl: 1   amoxicillin-clavulanate (AUGMENTIN) 875-125 MG tablet, Take 1 tablet by mouth 2 (two) times daily., Disp: 20 tablet, Rfl: 0   Evolocumab (REPATHA) 140 MG/ML SOSY, Inject 140 mg into the skin every 14 (fourteen) days., Disp: 6 mL, Rfl: 0   furosemide (LASIX) 20 MG tablet, Take 20 mg by mouth daily., Disp: , Rfl:    levothyroxine (SYNTHROID) 75 MCG tablet, Take 1 tablet (75 mcg total) by mouth daily., Disp: 100 tablet, Rfl: 3   loratadine (CLARITIN) 10 MG tablet, Take 10 mg by mouth daily., Disp: , Rfl:    methocarbamol (ROBAXIN) 500 MG tablet, Take 1 tablet (500 mg  total) by mouth 2 (two) times daily., Disp: 20 tablet, Rfl: 0   metoprolol succinate (TOPROL XL) 25 MG 24 hr tablet, Take 1 tablet (25 mg total) by mouth daily., Disp: 90 tablet, Rfl: 3   umeclidinium-vilanterol (ANORO ELLIPTA) 62.5-25 MCG/ACT AEPB, Inhale 1 puff into the lungs daily., Disp: 180 each, Rfl: 3   Vitamin D, Ergocalciferol, (DRISDOL) 1.25 MG (50000 UNIT) CAPS capsule, Take 1 capsule (50,000 Units total) by mouth every 7 (seven) days., Disp: 12 capsule, Rfl: 3   Omeprazole-Sodium Bicarbonate (ZEGERID) 20-1100 MG CAPS capsule, Take by mouth., Disp: , Rfl:  Social History   Socioeconomic History   Marital status: Married    Spouse name: Not on file   Number of children: 2   Years of education: Not on file   Highest education level: Associate degree: academic program  Occupational History   Occupation: Works in Science writer   Occupation: Disabled  Tobacco Use   Smoking status: Former    Packs/day: 0.50    Years: 30.00    Total pack years: 15.00    Types: Cigarettes    Quit date: 10/25/2007    Years since quitting: 14.9   Smokeless tobacco: Never  Vaping Use   Vaping Use: Never used   Substance and Sexual Activity   Alcohol use: Yes    Alcohol/week: 1.0 standard drink of alcohol    Types: 1 Glasses of wine per week    Comment: Occasional-once a year   Drug use: No   Sexual activity: Yes    Birth control/protection: Surgical  Other Topics Concern   Not on file  Social History Narrative   Remarried.    Previous occupation: Customer service manager.       Moved from [facility] in [place]   2 children, has stepchildren and grandchildren   Does not exercise   Social Determinants of Health   Financial Resource Strain: Low Risk  (12/27/2021)   Overall Financial Resource Strain (CARDIA)    Difficulty of Paying Living Expenses: Not hard at all  Food Insecurity: No Food Insecurity (12/27/2021)   Hunger Vital Sign    Worried About Running Out of Food in the Last Year: Never true    Ran Out of Food in the Last Year: Never true  Transportation Needs: No Transportation Needs (12/27/2021)   PRAPARE - Hydrologist (Medical): No    Lack of Transportation (Non-Medical): No  Physical Activity: Insufficiently Active (12/27/2021)   Exercise Vital Sign    Days of Exercise per Week: 3 days    Minutes of Exercise per Session: 30 min  Stress: No Stress Concern Present (12/27/2021)   [place]    Feeling of Stress : Not at all  Social Connections: [place] (12/27/2021)   Social Connection and Isolation Panel [NHANES]    Frequency of Communication with Friends and Family: More than three times a week    Frequency of Social Gatherings with Friends and Family: More than three times a week    Attends Religious Services: More than 4 times per year    Active Member of [employer] or Organizations: Yes    Attends Music therapist: More than 4 times per year    Marital Status: Married  Human resources officer Violence: Not At Risk (12/27/2021)   Humiliation, Afraid, Rape, and Kick questionnaire    Fear of Current or  Ex-Partner: No    Emotionally Abused: No    Physically Abused:  No    Sexually Abused: No   Family History  Problem Relation Age of Onset   Hypertension Father    Hyperlipidemia Father    Heart disease Father        Died at age 16 of MI   Hypothyroidism Father    Diabetes Maternal Grandmother    Raynaud syndrome Sister    Rheum arthritis Sister    Thyroid disease Sister    CAD Brother        has 4 stents   Rheum arthritis Brother    Pulmonary fibrosis Brother    Heart disease Sister 13       h/o MI '@age'$  12   Thyroid disease Sister    Heart disease Mother    Hypothyroidism Mother    Macular degeneration Mother    Breast cancer Neg Hx    Ovarian cancer Neg Hx    Prostate cancer Neg Hx    Colon cancer Neg Hx    Stomach cancer Neg Hx    Esophageal cancer Neg Hx     Objective: Office vital signs reviewed. BP 132/83   Pulse 77   Temp (!) 97.3 F (36.3 C)   Ht 5' 3.5" (1.613 m)   Wt 153 lb 6.4 oz (69.6 kg)   SpO2 95%   BMI 26.75 kg/m   Physical Exam Constitutional:      General: She is not in acute distress.    Appearance: Normal appearance. She is not ill-appearing.  HENT:     Head: Normocephalic. No right periorbital erythema or left periorbital erythema.     Right Ear: Tympanic membrane and ear canal normal. There is no impacted cerumen.     Left Ear: Tympanic membrane and ear canal normal. There is no impacted cerumen.     Nose: Mucosal edema, congestion and rhinorrhea present. No nasal deformity or nasal tenderness.     Right Sinus: No maxillary sinus tenderness or frontal sinus tenderness.     Left Sinus: No maxillary sinus tenderness or frontal sinus tenderness.     Comments: No sinus tenderness but some pressure    Mouth/Throat:     Mouth: Mucous membranes are moist.     Pharynx: Oropharynx is clear. No oropharyngeal exudate.  Cardiovascular:     Rate and Rhythm: Normal rate and regular rhythm.  Neurological:     Mental Status: She is alert.     Assessment/ Plan: 66 y.o. female   Ms. Kinder is a 66 year old female who presents to clinic following 2 days of increased sinus pressure. At this point, there is a low likelihood that the patient has a bacterial infection given no fevers, sinus pain, or purulent drainage. Physical exam is negative for erythema or edema over the involved cheekbone or periorbital area, cheek tenderness, or tenderness with percussion. Patient reports feeling increased pressure with palpation of sinuses but not pain. Counseled the patient that she should utilize nasal sprays, including Flonase and Afrin, to try and decrease inflammation to allow for nasal drainage. Given the patient's upcoming surgery, I will also send in a prescription for Augmentin. Counseled the patient on symptoms to look out for the progression to an antibiotic infection. The patient expressed understanding.  No orders of the defined types were placed in this encounter.  Meds ordered this encounter  Medications   amoxicillin-clavulanate (AUGMENTIN) 875-125 MG tablet    Sig: Take 1 tablet by mouth 2 (two) times daily.    Dispense:  20 tablet  Refill:  [place], [facility]

## 2022-09-20 NOTE — Patient Instructions (Signed)
Flonase twice daily/ Afrin (max 3 days) Augmentin given if needed.

## 2022-09-23 DIAGNOSIS — E785 Hyperlipidemia, unspecified: Secondary | ICD-10-CM | POA: Diagnosis not present

## 2022-09-23 DIAGNOSIS — Z79899 Other long term (current) drug therapy: Secondary | ICD-10-CM | POA: Diagnosis not present

## 2022-09-23 DIAGNOSIS — I5032 Chronic diastolic (congestive) heart failure: Secondary | ICD-10-CM | POA: Diagnosis not present

## 2022-09-23 DIAGNOSIS — I10 Essential (primary) hypertension: Secondary | ICD-10-CM | POA: Diagnosis not present

## 2022-09-23 DIAGNOSIS — I2510 Atherosclerotic heart disease of native coronary artery without angina pectoris: Secondary | ICD-10-CM | POA: Diagnosis not present

## 2022-09-23 DIAGNOSIS — J449 Chronic obstructive pulmonary disease, unspecified: Secondary | ICD-10-CM | POA: Diagnosis not present

## 2022-09-23 DIAGNOSIS — R0989 Other specified symptoms and signs involving the circulatory and respiratory systems: Secondary | ICD-10-CM | POA: Diagnosis not present

## 2022-10-09 DIAGNOSIS — R0902 Hypoxemia: Secondary | ICD-10-CM | POA: Diagnosis not present

## 2022-10-12 DIAGNOSIS — J3489 Other specified disorders of nose and nasal sinuses: Secondary | ICD-10-CM | POA: Diagnosis not present

## 2022-10-12 DIAGNOSIS — J343 Hypertrophy of nasal turbinates: Secondary | ICD-10-CM | POA: Diagnosis not present

## 2022-10-12 DIAGNOSIS — J342 Deviated nasal septum: Secondary | ICD-10-CM | POA: Diagnosis not present

## 2022-10-12 DIAGNOSIS — G501 Atypical facial pain: Secondary | ICD-10-CM | POA: Diagnosis not present

## 2022-10-12 DIAGNOSIS — D140 Benign neoplasm of middle ear, nasal cavity and accessory sinuses: Secondary | ICD-10-CM | POA: Diagnosis not present

## 2022-10-21 DIAGNOSIS — J322 Chronic ethmoidal sinusitis: Secondary | ICD-10-CM | POA: Diagnosis not present

## 2022-10-21 DIAGNOSIS — I10 Essential (primary) hypertension: Secondary | ICD-10-CM | POA: Diagnosis not present

## 2022-10-21 DIAGNOSIS — J328 Other chronic sinusitis: Secondary | ICD-10-CM | POA: Diagnosis not present

## 2022-10-21 DIAGNOSIS — I2510 Atherosclerotic heart disease of native coronary artery without angina pectoris: Secondary | ICD-10-CM | POA: Diagnosis not present

## 2022-10-21 DIAGNOSIS — J320 Chronic maxillary sinusitis: Secondary | ICD-10-CM | POA: Diagnosis not present

## 2022-10-21 DIAGNOSIS — Z87891 Personal history of nicotine dependence: Secondary | ICD-10-CM | POA: Diagnosis not present

## 2022-10-21 DIAGNOSIS — J449 Chronic obstructive pulmonary disease, unspecified: Secondary | ICD-10-CM | POA: Diagnosis not present

## 2022-10-21 DIAGNOSIS — Z79899 Other long term (current) drug therapy: Secondary | ICD-10-CM | POA: Diagnosis not present

## 2022-10-21 DIAGNOSIS — J343 Hypertrophy of nasal turbinates: Secondary | ICD-10-CM | POA: Diagnosis not present

## 2022-10-21 DIAGNOSIS — D140 Benign neoplasm of middle ear, nasal cavity and accessory sinuses: Secondary | ICD-10-CM | POA: Diagnosis not present

## 2022-10-21 DIAGNOSIS — E119 Type 2 diabetes mellitus without complications: Secondary | ICD-10-CM | POA: Diagnosis not present

## 2022-10-21 DIAGNOSIS — R0981 Nasal congestion: Secondary | ICD-10-CM | POA: Diagnosis not present

## 2022-10-21 DIAGNOSIS — J3489 Other specified disorders of nose and nasal sinuses: Secondary | ICD-10-CM | POA: Diagnosis not present

## 2022-10-21 DIAGNOSIS — E039 Hypothyroidism, unspecified: Secondary | ICD-10-CM | POA: Diagnosis not present

## 2022-10-21 DIAGNOSIS — J342 Deviated nasal septum: Secondary | ICD-10-CM | POA: Diagnosis not present

## 2022-10-27 DIAGNOSIS — J343 Hypertrophy of nasal turbinates: Secondary | ICD-10-CM | POA: Diagnosis not present

## 2022-10-27 DIAGNOSIS — D140 Benign neoplasm of middle ear, nasal cavity and accessory sinuses: Secondary | ICD-10-CM | POA: Diagnosis not present

## 2022-11-03 DIAGNOSIS — D140 Benign neoplasm of middle ear, nasal cavity and accessory sinuses: Secondary | ICD-10-CM | POA: Diagnosis not present

## 2022-11-09 DIAGNOSIS — R0902 Hypoxemia: Secondary | ICD-10-CM | POA: Diagnosis not present

## 2022-11-15 ENCOUNTER — Encounter: Payer: Self-pay | Admitting: Family Medicine

## 2022-11-23 DIAGNOSIS — J343 Hypertrophy of nasal turbinates: Secondary | ICD-10-CM | POA: Diagnosis not present

## 2022-11-23 DIAGNOSIS — D140 Benign neoplasm of middle ear, nasal cavity and accessory sinuses: Secondary | ICD-10-CM | POA: Diagnosis not present

## 2022-11-23 DIAGNOSIS — J322 Chronic ethmoidal sinusitis: Secondary | ICD-10-CM | POA: Diagnosis not present

## 2022-11-29 DIAGNOSIS — J449 Chronic obstructive pulmonary disease, unspecified: Secondary | ICD-10-CM | POA: Diagnosis not present

## 2022-11-30 ENCOUNTER — Telehealth: Payer: Medicare Other | Admitting: Family Medicine

## 2022-12-06 ENCOUNTER — Emergency Department (HOSPITAL_COMMUNITY)
Admission: EM | Admit: 2022-12-06 | Discharge: 2022-12-06 | Disposition: A | Discharge status: Home / Self Care | Payer: Medicare Other | Source: Home / Self Care | Attending: Emergency Medicine | Admitting: Emergency Medicine

## 2022-12-06 ENCOUNTER — Encounter (HOSPITAL_COMMUNITY): Payer: Self-pay

## 2022-12-06 ENCOUNTER — Emergency Department (HOSPITAL_COMMUNITY): Payer: Medicare Other

## 2022-12-06 DIAGNOSIS — J441 Chronic obstructive pulmonary disease with (acute) exacerbation: Secondary | ICD-10-CM | POA: Insufficient documentation

## 2022-12-06 DIAGNOSIS — E785 Hyperlipidemia, unspecified: Secondary | ICD-10-CM | POA: Diagnosis not present

## 2022-12-06 DIAGNOSIS — E039 Hypothyroidism, unspecified: Secondary | ICD-10-CM | POA: Diagnosis not present

## 2022-12-06 DIAGNOSIS — K760 Fatty (change of) liver, not elsewhere classified: Secondary | ICD-10-CM | POA: Diagnosis not present

## 2022-12-06 DIAGNOSIS — R0981 Nasal congestion: Secondary | ICD-10-CM | POA: Diagnosis not present

## 2022-12-06 DIAGNOSIS — I21A1 Myocardial infarction type 2: Secondary | ICD-10-CM | POA: Diagnosis not present

## 2022-12-06 DIAGNOSIS — R0602 Shortness of breath: Secondary | ICD-10-CM | POA: Diagnosis not present

## 2022-12-06 DIAGNOSIS — R042 Hemoptysis: Secondary | ICD-10-CM | POA: Diagnosis not present

## 2022-12-06 DIAGNOSIS — E119 Type 2 diabetes mellitus without complications: Secondary | ICD-10-CM | POA: Insufficient documentation

## 2022-12-06 DIAGNOSIS — J9601 Acute respiratory failure with hypoxia: Secondary | ICD-10-CM | POA: Diagnosis not present

## 2022-12-06 DIAGNOSIS — Z79899 Other long term (current) drug therapy: Secondary | ICD-10-CM | POA: Insufficient documentation

## 2022-12-06 DIAGNOSIS — R059 Cough, unspecified: Secondary | ICD-10-CM | POA: Diagnosis not present

## 2022-12-06 DIAGNOSIS — R0902 Hypoxemia: Secondary | ICD-10-CM | POA: Diagnosis not present

## 2022-12-06 DIAGNOSIS — E872 Acidosis, unspecified: Secondary | ICD-10-CM | POA: Diagnosis not present

## 2022-12-06 DIAGNOSIS — Z20822 Contact with and (suspected) exposure to covid-19: Secondary | ICD-10-CM | POA: Diagnosis not present

## 2022-12-06 DIAGNOSIS — J432 Centrilobular emphysema: Secondary | ICD-10-CM | POA: Diagnosis not present

## 2022-12-06 DIAGNOSIS — I10 Essential (primary) hypertension: Secondary | ICD-10-CM | POA: Insufficient documentation

## 2022-12-06 DIAGNOSIS — E1169 Type 2 diabetes mellitus with other specified complication: Secondary | ICD-10-CM | POA: Diagnosis not present

## 2022-12-06 DIAGNOSIS — I2510 Atherosclerotic heart disease of native coronary artery without angina pectoris: Secondary | ICD-10-CM | POA: Insufficient documentation

## 2022-12-06 DIAGNOSIS — R0600 Dyspnea, unspecified: Secondary | ICD-10-CM | POA: Diagnosis not present

## 2022-12-06 DIAGNOSIS — M8589 Other specified disorders of bone density and structure, multiple sites: Secondary | ICD-10-CM | POA: Diagnosis not present

## 2022-12-06 DIAGNOSIS — G4709 Other insomnia: Secondary | ICD-10-CM | POA: Diagnosis not present

## 2022-12-06 DIAGNOSIS — N3281 Overactive bladder: Secondary | ICD-10-CM | POA: Diagnosis not present

## 2022-12-06 DIAGNOSIS — Z1152 Encounter for screening for COVID-19: Secondary | ICD-10-CM | POA: Insufficient documentation

## 2022-12-06 DIAGNOSIS — I5032 Chronic diastolic (congestive) heart failure: Secondary | ICD-10-CM | POA: Diagnosis not present

## 2022-12-06 DIAGNOSIS — I110 Hypertensive heart disease with heart failure: Secondary | ICD-10-CM | POA: Diagnosis not present

## 2022-12-06 DIAGNOSIS — R778 Other specified abnormalities of plasma proteins: Secondary | ICD-10-CM | POA: Diagnosis not present

## 2022-12-06 DIAGNOSIS — I25110 Atherosclerotic heart disease of native coronary artery with unstable angina pectoris: Secondary | ICD-10-CM | POA: Diagnosis not present

## 2022-12-06 DIAGNOSIS — J439 Emphysema, unspecified: Secondary | ICD-10-CM | POA: Diagnosis not present

## 2022-12-06 DIAGNOSIS — J440 Chronic obstructive pulmonary disease with acute lower respiratory infection: Secondary | ICD-10-CM | POA: Diagnosis not present

## 2022-12-06 DIAGNOSIS — K219 Gastro-esophageal reflux disease without esophagitis: Secondary | ICD-10-CM | POA: Diagnosis not present

## 2022-12-06 LAB — COMPREHENSIVE METABOLIC PANEL
ALT: 43 U/L (ref 0–44)
AST: 41 U/L (ref 15–41)
Albumin: 3.9 g/dL (ref 3.5–5.0)
Alkaline Phosphatase: 85 U/L (ref 38–126)
Anion gap: 9 (ref 5–15)
BUN: 8 mg/dL (ref 8–23)
CO2: 25 mmol/L (ref 22–32)
Calcium: 8.8 mg/dL — ABNORMAL LOW (ref 8.9–10.3)
Chloride: 103 mmol/L (ref 98–111)
Creatinine, Ser: 0.85 mg/dL (ref 0.44–1.00)
GFR, Estimated: >60 mL/min (ref >60)
Glucose, Bld: 251 mg/dL — ABNORMAL HIGH (ref 70–99)
Potassium: 3.9 mmol/L (ref 3.5–5.1)
Sodium: 137 mmol/L (ref 135–145)
Total Bilirubin: 0.2 mg/dL — ABNORMAL LOW (ref 0.3–1.2)
Total Protein: 7.1 g/dL (ref 6.5–8.1)

## 2022-12-06 LAB — CBC WITH DIFFERENTIAL/PLATELET
Abs Immature Granulocytes: 0.04 10*3/uL (ref 0.00–0.07)
Basophils Absolute: 0 10*3/uL (ref 0.0–0.1)
Basophils Relative: 0 %
Eosinophils Absolute: 0 10*3/uL (ref 0.0–0.5)
Eosinophils Relative: 0 %
HCT: 43.7 % (ref 36.0–46.0)
Hemoglobin: 14.3 g/dL (ref 12.0–15.0)
Immature Granulocytes: 0 %
Lymphocytes Relative: 8 %
Lymphs Abs: 0.8 10*3/uL (ref 0.7–4.0)
MCH: 31.8 pg (ref 26.0–34.0)
MCHC: 32.7 g/dL (ref 30.0–36.0)
MCV: 97.1 fL (ref 80.0–100.0)
Monocytes Absolute: 0.3 10*3/uL (ref 0.1–1.0)
Monocytes Relative: 3 %
Neutro Abs: 8.8 10*3/uL — ABNORMAL HIGH (ref 1.7–7.7)
Neutrophils Relative %: 89 %
Platelets: 285 10*3/uL (ref 150–400)
RBC: 4.5 MIL/uL (ref 3.87–5.11)
RDW: 12.6 % (ref 11.5–15.5)
WBC: 10 10*3/uL (ref 4.0–10.5)
nRBC: 0 % (ref 0.0–0.2)

## 2022-12-06 LAB — TROPONIN I (HIGH SENSITIVITY): Troponin I (High Sensitivity): 4 ng/L (ref <18)

## 2022-12-06 LAB — RESP PANEL BY RT-PCR (RSV, FLU A&B, COVID)  RVPGX2
Influenza A by PCR: NEGATIVE
Influenza B by PCR: NEGATIVE
Resp Syncytial Virus by PCR: NEGATIVE
SARS Coronavirus 2 by RT PCR: NEGATIVE

## 2022-12-06 MED ORDER — BENZONATATE 100 MG PO CAPS: 100.0000 mg | ORAL_CAPSULE | Freq: Three times a day (TID) | ORAL | 0 refills | Status: DC

## 2022-12-06 MED ORDER — IPRATROPIUM-ALBUTEROL 0.5-2.5 (3) MG/3ML IN SOLN
3.0000 mL | Freq: Once | RESPIRATORY_TRACT | Status: AC
  Administered 2022-12-06: 3 mL via RESPIRATORY_TRACT
  Filled 2022-12-06: qty 3

## 2022-12-06 MED ORDER — ALBUTEROL SULFATE (2.5 MG/3ML) 0.083% IN NEBU: 2.5000 mg | INHALATION_SOLUTION | Freq: Four times a day (QID) | RESPIRATORY_TRACT | 12 refills | Status: AC | PRN

## 2022-12-06 MED ORDER — ALBUTEROL SULFATE (2.5 MG/3ML) 0.083% IN NEBU: 2.5000 mg | INHALATION_SOLUTION | Freq: Once | RESPIRATORY_TRACT | Status: DC

## 2022-12-06 MED ORDER — BENZONATATE 100 MG PO CAPS
100.0000 mg | ORAL_CAPSULE | Freq: Once | ORAL | Status: AC
  Administered 2022-12-06: 100 mg via ORAL
  Filled 2022-12-06: qty 1

## 2022-12-06 MED ORDER — METHYLPREDNISOLONE SODIUM SUCC 125 MG IJ SOLR
125.0000 mg | Freq: Once | INTRAMUSCULAR | Status: AC
  Administered 2022-12-06: 125 mg via INTRAVENOUS
  Filled 2022-12-06: qty 2

## 2022-12-06 NOTE — ED Notes (Signed)
Patient verbalizes understanding of d/c instructions. Opportunities for questions and answers were provided. Pt d/c from ED and ambulated to lobby with husband.

## 2022-12-06 NOTE — Discharge Instructions (Addendum)
It was a pleasure taking care of you today.  As discussed, all of your labs are reassuring.  Your chest x-ray did not show evidence of pneumonia.  Keep taking your antibiotic and steroids as prescribed by urgent care.  I am sending home with a prescription for nebulizer solution.  Use as needed for shortness of breath and cough.  Amoxicillin a.m. cough medication.  Please follow-up with PCP within the next few days.  Return to the ER for new or worsening symptoms.

## 2022-12-06 NOTE — ED Provider Triage Note (Signed)
Emergency Medicine Provider Triage Evaluation Note  Stacy [name] , a 67 y.o. female  was evaluated in triage.  Pt complains of shortness of breath. Had sinus surgery in late December around the same time husband tested positive for COVID. She is unsure if she contracted COVID at that time. SOB worsened yesterday. States that she has struggled to sleep as any specific position will trigger a cough. Took 2 aspirin yesterday for headache. Not on blood thinners.  Review of Systems  Positive: As above Negative: As above  Physical Exam  There were no vitals taken for this visit. Gen:   Awake, no distress Resp:  Normal effort  MSK:   Moves extremities without difficulty  Other:    Medical Decision Making  Medically screening exam initiated at 4:13 PM.  Appropriate orders placed.  LIVI AIME was informed that the remainder of the evaluation will be completed by another provider, this initial triage assessment does not replace that evaluation, and the importance of remaining in the ED until their evaluation is complete.     Luvenia Heller, PA-C 12/06/22 1620

## 2022-12-06 NOTE — ED Provider Notes (Signed)
[name] Provider Note   CSN: [id number] Arrival date & time: 12/06/22  1608     History  Chief Complaint  Patient presents with   Shortness of Breath    Stacy [name] is a 67 y.o. female with a past medical history significant for hypertension, COPD, diabetes, obesity, IBS, CAD, and hyperlipidemia who presents to the ED due to worsening shortness of breath, nasal congestion, and productive cough x 1 month. Patient states shortness of breath and cough worsened over the past few days.  Patient was evaluated at urgent care 6 days ago and prescribed Levaquin for a suspected "lung infection" per patient.  Patient states she was started on prednisone 3 days ago.  Patient has as needed home oxygen which she rarely uses however, notes she has been having to use 3 L over the past few days.  Patient notes her oxygen saturation gets to the mid 80s with ambulation.  Admits to some chest pressure.  No lower extremity edema.  No history of CHF.  Denies history of blood clots, recent surgeries or recent long immobilizations, and hormonal treatments.  No fever or chills.  Quit smoking roughly 15 years ago.  History obtained from patient and past medical records. No interpreter used during encounter.       Home Medications Prior to Admission medications   Medication Sig Start Date End Date Taking? Authorizing Provider  albuterol (PROVENTIL) (2.5 MG/3ML) 0.083% nebulizer solution Take 3 mLs (2.5 mg total) by nebulization every 6 (six) hours as needed for wheezing or shortness of breath. 12/06/22  Yes [place], Druscilla Brownie, PA-C  benzonatate (TESSALON) 100 MG capsule Take 1 capsule (100 mg total) by mouth every 8 (eight) hours. 12/06/22  Yes Alroy Portela C, PA-C  albuterol (PROVENTIL) (2.5 MG/3ML) 0.083% nebulizer solution Take 3 mLs (2.5 mg total) by nebulization every 6 (six) hours as needed for wheezing or shortness of breath. 09/19/20   Isla Pence,  MD  albuterol (VENTOLIN HFA) 108 (90 Base) MCG/ACT inhaler Inhale 2 puffs into the lungs every 6 (six) hours as needed for wheezing or shortness of breath. 04/22/22   [name], DO  amoxicillin-clavulanate (AUGMENTIN) 875-125 MG tablet Take 1 tablet by mouth 2 (two) times daily. 09/20/22   [name], DO  Evolocumab (REPATHA) 140 MG/ML SOSY Inject 140 mg into the skin every 14 (fourteen) days. 07/11/22   Revankar, Reita Cliche, MD  furosemide (LASIX) 20 MG tablet Take 20 mg by mouth daily. 08/05/22   [provider]  levothyroxine (SYNTHROID) 75 MCG tablet Take 1 tablet (75 mcg total) by mouth daily. 04/18/22   [name], DO  loratadine (CLARITIN) 10 MG tablet Take 10 mg by mouth daily.    [provider]  methocarbamol (ROBAXIN) 500 MG tablet Take 1 tablet (500 mg total) by mouth 2 (two) times daily. 08/04/21   Tedd Sias, PA  metoprolol succinate (TOPROL XL) 25 MG 24 hr tablet Take 1 tablet (25 mg total) by mouth daily. 08/03/22   Revankar, Reita Cliche, MD  Omeprazole-Sodium Bicarbonate (ZEGERID) 20-1100 MG CAPS capsule Take by mouth.    [provider]  umeclidinium-vilanterol (ANORO ELLIPTA) 62.5-25 MCG/ACT AEPB Inhale 1 puff into the lungs daily. 12/29/21   Collene Gobble, MD  Vitamin D, Ergocalciferol, (DRISDOL) 1.25 MG (50000 UNIT) CAPS capsule Take 1 capsule (50,000 Units total) by mouth every 7 (seven) days. 12/15/21   [name], DO  Allergies    Avelox [moxifloxacin hcl in nacl], Ciprofloxacin, Livalo [pitavastatin], Metformin and related, Moxifloxacin, Bactrim [sulfamethoxazole-trimethoprim], Statins, and Sulfamethoxazole-trimethoprim    Review of Systems   Review of Systems  Constitutional:  Negative for chills and fever.  HENT:  Positive for congestion and rhinorrhea.   Respiratory:  Positive for cough, shortness of breath and wheezing.   Cardiovascular:  Positive for chest pain. Negative for leg swelling.  All other  systems reviewed and are negative.   Physical Exam Updated Vital Signs BP 124/77   Pulse 64   Temp 97.8 F (36.6 C) (Oral)   Resp 20   Ht 5' 3.5" (1.613 m)   Wt 66.2 kg   SpO2 97%   BMI 25.46 kg/m  Physical Exam Vitals and nursing note reviewed.  Constitutional:      General: She is not in acute distress.    Appearance: She is not ill-appearing.  HENT:     Head: Normocephalic.  Eyes:     Pupils: Pupils are equal, round, and reactive to light.  Cardiovascular:     Rate and Rhythm: Normal rate and regular rhythm.     Pulses: Normal pulses.     Heart sounds: Normal heart sounds. No murmur heard.    No friction rub. No gallop.  Pulmonary:     Effort: Pulmonary effort is normal.     Breath sounds: Wheezing and rhonchi present.     Comments: On 3L [place].  Abdominal:     General: Abdomen is flat. There is no distension.     Palpations: Abdomen is soft.     Tenderness: There is no abdominal tenderness. There is no guarding or rebound.  Musculoskeletal:        General: Normal range of motion.     Cervical back: Neck supple.     Comments: No lower extremity edema  Skin:    General: Skin is warm and dry.  Neurological:     General: No focal deficit present.     Mental Status: She is alert.  Psychiatric:        Mood and Affect: Mood normal.        Behavior: Behavior normal.     ED Results / Procedures / Treatments   Labs (all labs ordered are listed, but only abnormal results are displayed) Labs Reviewed  COMPREHENSIVE METABOLIC PANEL - Abnormal; Notable for the following components:      Result Value   Glucose, Bld 251 (*)    Calcium 8.8 (*)    Total Bilirubin 0.2 (*)    All other components within normal limits  CBC WITH DIFFERENTIAL/PLATELET - Abnormal; Notable for the following components:   Neutro Abs 8.8 (*)    All other components within normal limits  RESP PANEL BY RT-PCR (RSV, FLU A&B, COVID)  [id number]  TROPONIN I (HIGH SENSITIVITY)     EKG None  Radiology DG Chest 2 View  Result Date: 12/06/2022 CLINICAL DATA:  Shortness of breath. EXAM: CHEST - 2 VIEW COMPARISON:  Chest x-ray October 16 23. FINDINGS: Mild streaky bibasilar opacities. Hyperinflation. No confluent consolidation. No visible pleural effusions or pneumothorax. Cardiomediastinal silhouette is within normal limits. Polyarticular degenerative change. Osteopenia. IMPRESSION: 1. Mild streaky bibasilar opacities, probably atelectasis. 2. Hyperinflation, suggestive of COPD/emphysema. Electronically Signed   By: Margaretha Sheffield M.D.   On: 12/06/2022 16:56    Procedures Procedures    Medications Ordered in ED Medications  methylPREDNISolone sodium succinate (SOLU-MEDROL) 125 mg/2 mL injection 125 mg (125 mg  Intravenous Given 12/06/22 2119)  ipratropium-albuterol (DUONEB) 0.5-2.5 (3) MG/3ML nebulizer solution 3 mL (3 mLs Nebulization Given 12/06/22 2122)  benzonatate (TESSALON) capsule 100 mg (100 mg Oral Given 12/06/22 2141)  ipratropium-albuterol (DUONEB) 0.5-2.5 (3) MG/3ML nebulizer solution 3 mL (3 mLs Nebulization Given 12/06/22 2202)    ED Course/ Medical Decision Making/ A&P Clinical Course as of 12/06/22 2217  Tue Dec 06, 2022  2214 Troponin I (High Sensitivity): 4 [CA]    Clinical Course User Index [CA] Suzy Bouchard, PA-C                             Medical Decision Making Amount and/or Complexity of Data Reviewed Independent Historian: spouse Labs: ordered. Decision-making details documented in ED Course. Radiology: ordered and independent interpretation performed. Decision-making details documented in ED Course. ECG/medicine tests: ordered and independent interpretation performed. Decision-making details documented in ED Course.  Risk Prescription drug management.   This patient presents to the ED for concern of SOB, this involves an extensive number of treatment options, and is a complaint that carries with it a high risk of  complications and morbidity.  The differential diagnosis includes COPD exacerbation, pneumonia, PE, ACS, etc  67 year old female presents to the ED due to worsening shortness of breath, cough, and nasal congestion x 1 month.  History of COPD.  Symptoms worsened over the past few days.  Currently on Levaquin and prednisone for the past few days from UC.  No history of blood clots.  No history of CHF.  Upon arrival patient afebrile with normal heart rate.  Patient on 3 L nasal cannula.  Patient has as needed at home oxygen which she rarely uses however, notes she has had to over the past few days due to oxygen in the mid 80s.  Routine labs ordered at triage.  Added troponin due to chest pressure to rule out ACS.  IV Solu-Medrol ordered.  Will give 1 DuoNeb and reassess patient.  Feel patient's symptoms are likely due to COPD exacerbation.  Lower suspicion for PE.  CBC reassuring.  No leukocytosis.  Normal hemoglobin.  RVP negative.  CMP significant for hyperglycemia at 251.  No anion gap.  Doubt DKA.  No major electrolyte derangements.  Chest x-ray personally reviewed and interpreted which demonstrates bibasilar opacities likely atelectasis.  Hyperinflation suggestive of COPD.  Troponin normal.  EKG demonstrates normal sinus rhythm.  No signs of acute ischemia.  Low suspicion for ACS.  Suspect symptoms related to COPD exacerbation.  9:44 PM reassessed patient after DuoNeb treatment.  Significant improvement in air movement.  No wheeze or rhonchi.  Shared decision making in regards to admission for COPD exacerbation versus discharge with symptomatic treatment.  Patient prefers to be discharged.  Patient requesting 1 more breathing treatment prior to discharge.  Patient has at home oxygen as needed.  Patient has been bumped down to 2 L nasal cannula with no evidence of hypoxia.  Patient with significant proved after second DuoNeb.  Patient stable for discharge.  Advised patient to continue taking her antibiotics  and steroids as prescribed by urgent care.  Follow-up with PCP within the next few days for recheck.  Return for worsening symptoms. Strict ED precautions discussed with patient. Patient states understanding and agrees to plan. Patient discharged home in no acute distress and stable vitals.  Has PCP       Final Clinical Impression(s) / ED Diagnoses Final diagnoses:  COPD exacerbation ([place])  Rx / DC Orders ED Discharge Orders          Ordered    albuterol (PROVENTIL) (2.5 MG/3ML) 0.083% nebulizer solution  Every 6 hours PRN        12/06/22 2146    benzonatate (TESSALON) 100 MG capsule  Every 8 hours        12/06/22 2147              Karie Kirks 12/06/22 2217    Charlesetta Shanks, MD 12/14/22 [phone]

## 2022-12-06 NOTE — ED Triage Notes (Addendum)
Pt bib ems from home; sinus procedure done end of December, since then pt endorses sob, congestion and cough productive; sob and cough worsening over past few days; home 02 2L chronically; increased 3L today; 96%, NSR, 156/94, t 97.49f CBG 284; 18 lfa; 200 mls ns given pta

## 2022-12-07 ENCOUNTER — Telehealth: Payer: Self-pay | Admitting: Family Medicine

## 2022-12-08 ENCOUNTER — Inpatient Hospital Stay (HOSPITAL_COMMUNITY)
Admission: EM | Admit: 2022-12-08 | Discharge: 2022-12-11 | DRG: 190 | Disposition: A | Discharge status: Home / Self Care | Payer: Medicare Other | Attending: Internal Medicine | Admitting: Internal Medicine

## 2022-12-08 ENCOUNTER — Other Ambulatory Visit: Payer: Self-pay

## 2022-12-08 ENCOUNTER — Emergency Department (HOSPITAL_COMMUNITY): Payer: Medicare Other

## 2022-12-08 ENCOUNTER — Ambulatory Visit (INDEPENDENT_AMBULATORY_CARE_PROVIDER_SITE_OTHER): Payer: Medicare Other | Admitting: Family Medicine

## 2022-12-08 ENCOUNTER — Encounter (HOSPITAL_COMMUNITY): Payer: Self-pay

## 2022-12-08 ENCOUNTER — Encounter: Payer: Self-pay | Admitting: Family Medicine

## 2022-12-08 ENCOUNTER — Telehealth: Payer: Self-pay | Admitting: Emergency Medicine

## 2022-12-08 VITALS — BP 131/74 | HR 83 | Temp 97.2°F | Ht 63.5 in | Wt 149.5 lb

## 2022-12-08 DIAGNOSIS — J441 Chronic obstructive pulmonary disease with (acute) exacerbation: Secondary | ICD-10-CM

## 2022-12-08 DIAGNOSIS — Z9981 Dependence on supplemental oxygen: Secondary | ICD-10-CM

## 2022-12-08 DIAGNOSIS — I5032 Chronic diastolic (congestive) heart failure: Secondary | ICD-10-CM | POA: Diagnosis present

## 2022-12-08 DIAGNOSIS — Z833 Family history of diabetes mellitus: Secondary | ICD-10-CM

## 2022-12-08 DIAGNOSIS — E669 Obesity, unspecified: Secondary | ICD-10-CM | POA: Diagnosis present

## 2022-12-08 DIAGNOSIS — E039 Hypothyroidism, unspecified: Secondary | ICD-10-CM | POA: Diagnosis not present

## 2022-12-08 DIAGNOSIS — E872 Acidosis, unspecified: Secondary | ICD-10-CM | POA: Diagnosis present

## 2022-12-08 DIAGNOSIS — Z20822 Contact with and (suspected) exposure to covid-19: Secondary | ICD-10-CM | POA: Diagnosis present

## 2022-12-08 DIAGNOSIS — J9610 Chronic respiratory failure, unspecified whether with hypoxia or hypercapnia: Secondary | ICD-10-CM | POA: Diagnosis present

## 2022-12-08 DIAGNOSIS — J189 Pneumonia, unspecified organism: Secondary | ICD-10-CM

## 2022-12-08 DIAGNOSIS — Z8249 Family history of ischemic heart disease and other diseases of the circulatory system: Secondary | ICD-10-CM

## 2022-12-08 DIAGNOSIS — Z8262 Family history of osteoporosis: Secondary | ICD-10-CM

## 2022-12-08 DIAGNOSIS — J440 Chronic obstructive pulmonary disease with acute lower respiratory infection: Secondary | ICD-10-CM | POA: Diagnosis present

## 2022-12-08 DIAGNOSIS — J439 Emphysema, unspecified: Secondary | ICD-10-CM | POA: Diagnosis not present

## 2022-12-08 DIAGNOSIS — J432 Centrilobular emphysema: Secondary | ICD-10-CM | POA: Diagnosis present

## 2022-12-08 DIAGNOSIS — R072 Precordial pain: Secondary | ICD-10-CM

## 2022-12-08 DIAGNOSIS — Z79890 Hormone replacement therapy (postmenopausal): Secondary | ICD-10-CM | POA: Diagnosis not present

## 2022-12-08 DIAGNOSIS — M8589 Other specified disorders of bone density and structure, multiple sites: Secondary | ICD-10-CM | POA: Diagnosis present

## 2022-12-08 DIAGNOSIS — Z825 Family history of asthma and other chronic lower respiratory diseases: Secondary | ICD-10-CM

## 2022-12-08 DIAGNOSIS — R778 Other specified abnormalities of plasma proteins: Secondary | ICD-10-CM | POA: Diagnosis not present

## 2022-12-08 DIAGNOSIS — Z83719 Family history of colon polyps, unspecified: Secondary | ICD-10-CM

## 2022-12-08 DIAGNOSIS — E785 Hyperlipidemia, unspecified: Secondary | ICD-10-CM | POA: Diagnosis present

## 2022-12-08 DIAGNOSIS — R042 Hemoptysis: Secondary | ICD-10-CM

## 2022-12-08 DIAGNOSIS — J9601 Acute respiratory failure with hypoxia: Secondary | ICD-10-CM | POA: Diagnosis not present

## 2022-12-08 DIAGNOSIS — R0602 Shortness of breath: Secondary | ICD-10-CM | POA: Diagnosis not present

## 2022-12-08 DIAGNOSIS — G4709 Other insomnia: Secondary | ICD-10-CM | POA: Diagnosis present

## 2022-12-08 DIAGNOSIS — E118 Type 2 diabetes mellitus with unspecified complications: Secondary | ICD-10-CM | POA: Diagnosis not present

## 2022-12-08 DIAGNOSIS — I110 Hypertensive heart disease with heart failure: Secondary | ICD-10-CM | POA: Diagnosis present

## 2022-12-08 DIAGNOSIS — Z87891 Personal history of nicotine dependence: Secondary | ICD-10-CM

## 2022-12-08 DIAGNOSIS — I25110 Atherosclerotic heart disease of native coronary artery with unstable angina pectoris: Secondary | ICD-10-CM | POA: Diagnosis present

## 2022-12-08 DIAGNOSIS — I209 Angina pectoris, unspecified: Secondary | ICD-10-CM | POA: Diagnosis not present

## 2022-12-08 DIAGNOSIS — Z8349 Family history of other endocrine, nutritional and metabolic diseases: Secondary | ICD-10-CM

## 2022-12-08 DIAGNOSIS — K219 Gastro-esophageal reflux disease without esophagitis: Secondary | ICD-10-CM | POA: Diagnosis present

## 2022-12-08 DIAGNOSIS — E1169 Type 2 diabetes mellitus with other specified complication: Secondary | ICD-10-CM | POA: Diagnosis not present

## 2022-12-08 DIAGNOSIS — B9781 Human metapneumovirus as the cause of diseases classified elsewhere: Secondary | ICD-10-CM | POA: Diagnosis present

## 2022-12-08 DIAGNOSIS — F419 Anxiety disorder, unspecified: Secondary | ICD-10-CM | POA: Diagnosis present

## 2022-12-08 DIAGNOSIS — J9621 Acute and chronic respiratory failure with hypoxia: Secondary | ICD-10-CM | POA: Diagnosis present

## 2022-12-08 DIAGNOSIS — Z882 Allergy status to sulfonamides status: Secondary | ICD-10-CM

## 2022-12-08 DIAGNOSIS — G4700 Insomnia, unspecified: Secondary | ICD-10-CM | POA: Diagnosis present

## 2022-12-08 DIAGNOSIS — N3281 Overactive bladder: Secondary | ICD-10-CM | POA: Diagnosis present

## 2022-12-08 DIAGNOSIS — Z881 Allergy status to other antibiotic agents status: Secondary | ICD-10-CM

## 2022-12-08 DIAGNOSIS — J449 Chronic obstructive pulmonary disease, unspecified: Secondary | ICD-10-CM | POA: Diagnosis present

## 2022-12-08 DIAGNOSIS — Z1152 Encounter for screening for COVID-19: Secondary | ICD-10-CM

## 2022-12-08 DIAGNOSIS — J9620 Acute and chronic respiratory failure, unspecified whether with hypoxia or hypercapnia: Secondary | ICD-10-CM | POA: Diagnosis present

## 2022-12-08 DIAGNOSIS — I21A1 Myocardial infarction type 2: Secondary | ICD-10-CM | POA: Diagnosis present

## 2022-12-08 DIAGNOSIS — K760 Fatty (change of) liver, not elsewhere classified: Secondary | ICD-10-CM | POA: Diagnosis present

## 2022-12-08 DIAGNOSIS — I10 Essential (primary) hypertension: Secondary | ICD-10-CM | POA: Diagnosis present

## 2022-12-08 DIAGNOSIS — R739 Hyperglycemia, unspecified: Secondary | ICD-10-CM

## 2022-12-08 DIAGNOSIS — R0600 Dyspnea, unspecified: Secondary | ICD-10-CM | POA: Diagnosis not present

## 2022-12-08 DIAGNOSIS — Z888 Allergy status to other drugs, medicaments and biological substances status: Secondary | ICD-10-CM

## 2022-12-08 DIAGNOSIS — Z7951 Long term (current) use of inhaled steroids: Secondary | ICD-10-CM

## 2022-12-08 DIAGNOSIS — K589 Irritable bowel syndrome without diarrhea: Secondary | ICD-10-CM | POA: Diagnosis present

## 2022-12-08 DIAGNOSIS — R7989 Other specified abnormal findings of blood chemistry: Secondary | ICD-10-CM | POA: Diagnosis present

## 2022-12-08 DIAGNOSIS — R0902 Hypoxemia: Secondary | ICD-10-CM

## 2022-12-08 DIAGNOSIS — Z79899 Other long term (current) drug therapy: Secondary | ICD-10-CM

## 2022-12-08 DIAGNOSIS — Z8261 Family history of arthritis: Secondary | ICD-10-CM

## 2022-12-08 DIAGNOSIS — Z83438 Family history of other disorder of lipoprotein metabolism and other lipidemia: Secondary | ICD-10-CM

## 2022-12-08 HISTORY — DX: Hemoptysis: R04.2

## 2022-12-08 HISTORY — DX: Pneumonia, unspecified organism: J18.9

## 2022-12-08 HISTORY — DX: Myocardial infarction type 2: I21.A1

## 2022-12-08 LAB — COMPREHENSIVE METABOLIC PANEL
ALT: 50 U/L — ABNORMAL HIGH (ref 0–44)
AST: 53 U/L — ABNORMAL HIGH (ref 15–41)
Albumin: 4.1 g/dL (ref 3.5–5.0)
Alkaline Phosphatase: 79 U/L (ref 38–126)
Anion gap: 14 (ref 5–15)
BUN: 20 mg/dL (ref 8–23)
CO2: 24 mmol/L (ref 22–32)
Calcium: 9.2 mg/dL (ref 8.9–10.3)
Chloride: 99 mmol/L (ref 98–111)
Creatinine, Ser: 1.01 mg/dL — ABNORMAL HIGH (ref 0.44–1.00)
GFR, Estimated: >60 mL/min (ref >60)
Glucose, Bld: 238 mg/dL — ABNORMAL HIGH (ref 70–99)
Potassium: 3.8 mmol/L (ref 3.5–5.1)
Sodium: 137 mmol/L (ref 135–145)
Total Bilirubin: 0.5 mg/dL (ref 0.3–1.2)
Total Protein: 7.3 g/dL (ref 6.5–8.1)

## 2022-12-08 LAB — CBC
HCT: 46 % (ref 36.0–46.0)
Hemoglobin: 15.3 g/dL — ABNORMAL HIGH (ref 12.0–15.0)
MCH: 32.2 pg (ref 26.0–34.0)
MCHC: 33.3 g/dL (ref 30.0–36.0)
MCV: 96.8 fL (ref 80.0–100.0)
Platelets: 296 10*3/uL (ref 150–400)
RBC: 4.75 MIL/uL (ref 3.87–5.11)
RDW: 12.6 % (ref 11.5–15.5)
WBC: 17.2 10*3/uL — ABNORMAL HIGH (ref 4.0–10.5)
nRBC: 0 % (ref 0.0–0.2)

## 2022-12-08 LAB — CBG MONITORING, ED: Glucose-Capillary: 205 mg/dL — ABNORMAL HIGH (ref 70–99)

## 2022-12-08 LAB — RESP PANEL BY RT-PCR (RSV, FLU A&B, COVID)  RVPGX2
Influenza A by PCR: NEGATIVE
Influenza B by PCR: NEGATIVE
Resp Syncytial Virus by PCR: NEGATIVE
SARS Coronavirus 2 by RT PCR: NEGATIVE

## 2022-12-08 LAB — BRAIN NATRIURETIC PEPTIDE: B Natriuretic Peptide: 190 pg/mL — ABNORMAL HIGH (ref 0.0–100.0)

## 2022-12-08 LAB — TROPONIN I (HIGH SENSITIVITY): Troponin I (High Sensitivity): 579 ng/L (ref <18)

## 2022-12-08 MED ORDER — SODIUM CHLORIDE 0.9 % IV SOLN
2.0000 g | INTRAVENOUS | Status: DC
  Administered 2022-12-09: 2 g via INTRAVENOUS
  Filled 2022-12-08: qty 20

## 2022-12-08 MED ORDER — ACETAMINOPHEN 325 MG PO TABS: 650.0000 mg | ORAL_TABLET | Freq: Four times a day (QID) | ORAL | Status: DC | PRN

## 2022-12-08 MED ORDER — IPRATROPIUM-ALBUTEROL 0.5-2.5 (3) MG/3ML IN SOLN
3.0000 mL | Freq: Four times a day (QID) | RESPIRATORY_TRACT | Status: DC
  Administered 2022-12-09 (×2): 3 mL via RESPIRATORY_TRACT
  Filled 2022-12-08 (×2): qty 3

## 2022-12-08 MED ORDER — SODIUM CHLORIDE 0.9 % IV SOLN
500.0000 mg | Freq: Once | INTRAVENOUS | Status: AC
  Administered 2022-12-09: 500 mg via INTRAVENOUS
  Filled 2022-12-08: qty 5

## 2022-12-08 MED ORDER — ACETAMINOPHEN 650 MG RE SUPP: 650.0000 mg | Freq: Four times a day (QID) | RECTAL | Status: DC | PRN

## 2022-12-08 MED ORDER — IPRATROPIUM-ALBUTEROL 0.5-2.5 (3) MG/3ML IN SOLN
3.0000 mL | Freq: Once | RESPIRATORY_TRACT | Status: AC
  Administered 2022-12-08: 3 mL via RESPIRATORY_TRACT
  Filled 2022-12-08: qty 3

## 2022-12-08 MED ORDER — PREDNISONE 20 MG PO TABS
40.0000 mg | ORAL_TABLET | Freq: Every day | ORAL | Status: DC
  Administered 2022-12-10 – 2022-12-11 (×2): 40 mg via ORAL
  Filled 2022-12-08 (×2): qty 2

## 2022-12-08 MED ORDER — SODIUM CHLORIDE 0.9% FLUSH
3.0000 mL | Freq: Two times a day (BID) | INTRAVENOUS | Status: DC
  Administered 2022-12-09 – 2022-12-10 (×2): 3 mL via INTRAVENOUS

## 2022-12-08 MED ORDER — IOHEXOL 350 MG/ML SOLN
100.0000 mL | Freq: Once | INTRAVENOUS | Status: AC | PRN
  Administered 2022-12-08: 100 mL via INTRAVENOUS

## 2022-12-08 MED ORDER — METHYLPREDNISOLONE SODIUM SUCC 125 MG IJ SOLR
125.0000 mg | Freq: Once | INTRAMUSCULAR | Status: AC
  Administered 2022-12-08: 125 mg via INTRAVENOUS
  Filled 2022-12-08: qty 2

## 2022-12-08 MED ORDER — INSULIN ASPART 100 UNIT/ML IJ SOLN
0.0000 [IU] | INTRAMUSCULAR | Status: DC
  Administered 2022-12-09: 3 [IU] via SUBCUTANEOUS
  Administered 2022-12-09: 5 [IU] via SUBCUTANEOUS
  Filled 2022-12-08: qty 0.09

## 2022-12-08 MED ORDER — SODIUM CHLORIDE 0.9 % IV SOLN
500.0000 mg | INTRAVENOUS | Status: DC
  Administered 2022-12-10 – 2022-12-11 (×2): 500 mg via INTRAVENOUS
  Filled 2022-12-08 (×2): qty 5

## 2022-12-08 MED ORDER — ALBUTEROL SULFATE (2.5 MG/3ML) 0.083% IN NEBU: 2.5000 mg | INHALATION_SOLUTION | Freq: Four times a day (QID) | RESPIRATORY_TRACT | Status: DC | PRN

## 2022-12-08 MED ORDER — SODIUM CHLORIDE 0.9 % IV SOLN
1.0000 g | Freq: Once | INTRAVENOUS | Status: AC
  Administered 2022-12-09: 1 g via INTRAVENOUS
  Filled 2022-12-08: qty 10

## 2022-12-08 MED ORDER — SODIUM CHLORIDE 0.9% FLUSH: 3.0000 mL | INTRAVENOUS | Status: DC | PRN

## 2022-12-08 MED ORDER — LEVOTHYROXINE SODIUM 75 MCG PO TABS
75.0000 ug | ORAL_TABLET | Freq: Every day | ORAL | Status: DC
  Administered 2022-12-09 – 2022-12-11 (×3): 75 ug via ORAL
  Filled 2022-12-08 (×3): qty 1

## 2022-12-08 MED ORDER — METHYLPREDNISOLONE SODIUM SUCC 40 MG IJ SOLR
40.0000 mg | Freq: Two times a day (BID) | INTRAMUSCULAR | Status: AC
  Administered 2022-12-09 (×2): 40 mg via INTRAVENOUS
  Filled 2022-12-08 (×2): qty 1

## 2022-12-08 MED ORDER — SODIUM CHLORIDE 0.9 % IV SOLN: 250.0000 mL | INTRAVENOUS | Status: DC | PRN

## 2022-12-08 MED ORDER — MAGNESIUM SULFATE 2 GM/50ML IV SOLN
2.0000 g | Freq: Once | INTRAVENOUS | Status: AC
  Administered 2022-12-08: 2 g via INTRAVENOUS
  Filled 2022-12-08: qty 50

## 2022-12-08 MED ORDER — HYDROCODONE-ACETAMINOPHEN 5-325 MG PO TABS: 1.0000 | ORAL_TABLET | ORAL | Status: DC | PRN

## 2022-12-08 NOTE — Assessment & Plan Note (Signed)
In the setting of COPD exacerbation and pneumonia.  Noted to be hypoxic at home with sats down to 70s.

## 2022-12-08 NOTE — Assessment & Plan Note (Signed)
Chronic stable cont synthroid at 75 mcg PO q day  check TSH

## 2022-12-08 NOTE — Telephone Encounter (Signed)
Called and spoke with patient's husband Joe as patient was unable to speak at time of call. He stated that the patient went to the ED on 02/13 since she was coughing up rusty color phlegm. She was given a breathing treatment and sent home. Since she has been home, the coughing has continued. She is also very SOB and per the husband, very weak and pale. Denied any fever or body aches.   He stated that she did not want to go back to the ED as she felt this was a waste of time. She wanted to see [facility]. I explained to Joe that [facility] did not have any openings for today and that the soonest APP appt wasn't until 2/19 at 3pm with Katie. They accepted this appt since her F/U with [facility] wasn't until March.   I advised him that I would send a message to [facility] to see if he had any recommendations. He is aware that if she gets worse, she needs to go back to the ED.   RB, can you please advise? Thanks!

## 2022-12-08 NOTE — H&P (Addendum)
Stacy [name] [id number] DOB: [birth year] DOA: 12/08/2022   PCP: [name], DO   Outpatient Specialists:   CARDS:  Brunetta Genera, MD  [facility]   Pulmonary  Dr. Dr. Lamonte Sakai  Patient arrived to ER on 12/08/22 at 2011 Referred by Attending Tegeler, Gwenyth Allegra, *   Patient coming from:    home Lives With family    Chief Complaint:   Chief Complaint  Patient presents with   Shortness of Breath         HPI: Stacy [name] is a 67 y.o. female with medical history significant of COPD on chronic oxygen, HTN, HLD Dm2, Hypothyrodism    Presented with worsening shortness of breath Pt with known hx of COPD presented to PCP w 2wk hx of COUgh wheezing, SOB, yellow sputum She has oxygen at home but only uses as needed but has to be using it continuously 2 to 3 L to keep O2 in the 90s.  She was seen at urgent care and was started on Levaquin and prednisone then had to go to ER on the 13th for COPD exacerbation at that time chest x-ray showed mild bibasilar opacities felt to be atelectasis respiratory panel was negative she was given IV steroids and DuoNeb treatment and was able to go home but progressed since then.  She has pulmonologist but did not consult them Patient became so short of breath she had hard time speaking.  Also endorses history of rusty colored sputum she laid on attempted to call pulmonology but was told that she needs to go to emergency department  Hypoxic down to 70 at home  On arrival to ER was having CP  No longer smokes  Drinks very rare Reports rust tinged sputum No bright red Report chest pressure feels like she pulled rib coughing   Initial COVID TEST  NEGATIVE*   Lab Results  Component Value Date   [place] 12/08/2022   [place] NEGATIVE 12/06/2022   [place] Not Detected 06/14/2021   [place] NEGATIVE 09/19/2020     Regarding pertinent Chronic problems:     Hyperlipidemia - on statins Repatha Lipid Panel      Component Value Date/Time   CHOL 130 08/03/2022 1342   TRIG 184 (H) 08/03/2022 1342   HDL 47 08/03/2022 1342   CHOLHDL 2.8 08/03/2022 1342   CHOLHDL 3.3 Ratio 08/05/2009 2054   VLDL 21 08/05/2009 2054   LDLCALC 53 08/03/2022 1342   LABVLDL 30 08/03/2022 1342     HTN on LAsix History of diastolic CHF on Lasix   - last echo 2023 EF 66      DM 2 -  Lab Results  Component Value Date   HGBA1C 6.5 (H) 04/18/2022   diet controlled   Hypothyroidism:  Lab Results  Component Value Date   TSH 2.700 08/03/2022   on synthroid      COPD -  followed by pulmonology    on baseline oxygen  2L,  PRN     While in ER:   Found to have evidence of COPD exacerbation white blood cell count elevated 17.2 but that is in the setting of recent steroid use.  Troponin noted to be elevated at 579 She was given a dose of IV mag, solumedrol and Duoneb   CXR -  NON acute   CTA chest -  no PE,  no evidence of infiltrate  Focal area of patchy infiltration demonstrated in the left lower lung and lingula likely representing an  area of pneumonia. Emphysematous changes in the lungs.   Fatty infiltration of the liver.  Following Medications were ordered in ER: Medications  cefTRIAXone (ROCEPHIN) 1 g in sodium chloride 0.9 % 100 mL IVPB (has no administration in time range)  azithromycin (ZITHROMAX) 500 mg in sodium chloride 0.9 % 250 mL IVPB (has no administration in time range)  ipratropium-albuterol (DUONEB) 0.5-2.5 (3) MG/3ML nebulizer solution 3 mL (3 mLs Nebulization Given 12/08/22 2148)  methylPREDNISolone sodium succinate (SOLU-MEDROL) 125 mg/2 mL injection 125 mg (125 mg Intravenous Given 12/08/22 2147)  magnesium sulfate IVPB 2 g 50 mL (0 g Intravenous Stopped 12/08/22 2315)  iohexol (OMNIPAQUE) 350 MG/ML injection 100 mL (100 mLs Intravenous Contrast Given 12/08/22 2217)    _______________________________________________________ ER Provider Called:   Cardiolgy  Dr. Grayce Sessions They Recommend admit to  medicine  y myocardial injury in the setting of this pneumonia. He recommends admission to [facility] and consult cardiology when she gets there. The hemoptysis he will hold on heparinization at this time and agrees with medicine admission.  Will see on arrival to [facility]    ED Triage Vitals  Enc Vitals Group     BP 12/08/22 2029 138/88     Pulse Rate 12/08/22 2029 80     Resp 12/08/22 2029 16     Temp 12/08/22 2029 98.1 F (36.7 C)     Temp Source 12/08/22 2029 Oral     SpO2 12/08/22 2029 99 %     Weight --      Height --      Head Circumference --      Peak Flow --      Pain Score 12/08/22 2027 6     Pain Loc --      Pain Edu? --      Excl. in [place]? --   TMAX(24)@     _________________________________________ Significant initial  Findings: Abnormal Labs Reviewed  CBC - Abnormal; Notable for the following components:      Result Value   WBC 17.2 (*)    Hemoglobin 15.3 (*)    All other components within normal limits  COMPREHENSIVE METABOLIC PANEL - Abnormal; Notable for the following components:   Glucose, Bld 238 (*)    Creatinine, Ser 1.01 (*)    AST 53 (*)    ALT 50 (*)    All other components within normal limits  BRAIN NATRIURETIC PEPTIDE - Abnormal; Notable for the following components:   B Natriuretic Peptide 190.0 (*)    All other components within normal limits  TROPONIN I (HIGH SENSITIVITY) - Abnormal; Notable for the following components:   Troponin I (High Sensitivity) 579 (*)    All other components within normal limits   _____________ Troponin 579   ECG: Ordered Personally reviewed and interpreted by me showing: HR : 72 Rhythm: NSR,   no evidence of ischemic changes QTC 455    The recent clinical data is shown below. Vitals:   12/08/22 2145 12/08/22 2200 12/08/22 2245 12/08/22 2300  BP: 131/89 132/85 (!) 148/82   Pulse: 73 80 77 71  Resp: 14 17 13 12  $ Temp:      TempSrc:      SpO2: 93% 94% 95% 95%      WBC     Component Value Date/Time   WBC  17.2 (H) 12/08/2022 2122   LYMPHSABS 0.8 12/06/2022 1614   LYMPHSABS 2.3 01/18/2022 1634   MONOABS 0.3 12/06/2022 1614   EOSABS 0.0 12/06/2022 1614  EOSABS 0.3 01/18/2022 1634   BASOSABS 0.0 12/06/2022 1614   BASOSABS 0.0 01/18/2022 1634      UA  ordered   Urine analysis:    Component Value Date/Time   COLORURINE YELLOW 12/29/2013 1006   APPEARANCEUR Clear 08/06/2021 1037   LABSPEC 1.010 12/29/2013 1006   PHURINE 5.5 12/29/2013 1006   GLUCOSEU Negative 08/06/2021 1037   HGBUR NEGATIVE 12/29/2013 1006   HGBUR trace-intact 01/18/2010 1534   BILIRUBINUR Negative 08/06/2021 1037   KETONESUR NEGATIVE 12/29/2013 1006   PROTEINUR Negative 08/06/2021 1037   PROTEINUR NEGATIVE 12/29/2013 1006   UROBILINOGEN 0.2 12/29/2013 1006   NITRITE Negative 08/06/2021 1037   NITRITE NEGATIVE 12/29/2013 1006   LEUKOCYTESUR Trace (A) 08/06/2021 1037    Results for orders placed or performed during the hospital encounter of 12/08/22  Resp panel by RT-PCR (RSV, Flu A&B, Covid) Anterior Nasal Swab     Status: None   Collection Time: 12/08/22 10:13 PM   Specimen: Anterior Nasal Swab  Result Value Ref Range Status   SARS Coronavirus 2 by RT PCR NEGATIVE NEGATIVE Final         Influenza A by PCR NEGATIVE NEGATIVE Final   Influenza B by PCR NEGATIVE NEGATIVE Final         Resp Syncytial Virus by PCR NEGATIVE NEGATIVE Final          _______________________________________________ Hospitalist was called for admission for   Pneumonia due to infectious organism, unspecified laterality, unspecified part of lung    Elevated troponin    The following Work up has been ordered so far:  Orders Placed This Encounter  Procedures   Resp panel by RT-PCR (RSV, Flu A&B, Covid) Anterior Nasal Swab   Blood culture (routine x 2)   DG Chest 2 View   CT Angio Chest PE W and/or Wo Contrast   CBC   Comprehensive metabolic panel   Brain natriuretic peptide   Lactic acid, plasma   Cardiac monitoring    Utilize spacer/aerochamber with mdi inhaler for COVID-19 positive patients or PUI for COVID-19   If O2 Sat <94% administer O2 at 2 liters/minute via nasal cannula   Inpatient consult to Cardiology   Consult to hospitalist   Pulse oximetry, continuous   Adult wheeze protocol - initiate by RT   Pulse oximetry, continuous   ED EKG   EKG 12-Lead   EKG 12-Lead     OTHER Significant initial  Findings:  labs showing:    Recent Labs  Lab 12/06/22 1614 12/08/22 2122  NA 137 137  K 3.9 3.8  CO2 25 24  GLUCOSE 251* 238*  BUN 8 20  CREATININE 0.85 1.01*  CALCIUM 8.8* 9.2    Cr    stable,    Lab Results  Component Value Date   CREATININE 1.01 (H) 12/08/2022   CREATININE 0.85 12/06/2022   CREATININE 0.87 08/03/2022    Recent Labs  Lab 12/06/22 1614 12/08/22 2122  AST 41 53*  ALT 43 50*  ALKPHOS 85 79  BILITOT 0.2* 0.5  PROT 7.1 7.3  ALBUMIN 3.9 4.1   Lab Results  Component Value Date   CALCIUM 9.2 12/08/2022    Plt: Lab Results  Component Value Date   PLT 296 12/08/2022        Recent Labs  Lab 12/06/22 1614 12/08/22 2122  WBC 10.0 17.2*  NEUTROABS 8.8*  --   HGB 14.3 15.3*  HCT 43.7 46.0  MCV 97.1 96.8  PLT 285 296  HG/HCT stable,      Component Value Date/Time   HGB 15.3 (H) 12/08/2022 2122   HGB 15.3 08/03/2022 1342   HCT 46.0 12/08/2022 2122   HCT 46.1 08/03/2022 1342   MCV 96.8 12/08/2022 2122   MCV 95 08/03/2022 1342      BNP (last 3 results) Recent Labs    12/08/22 2122  BNP 190.0*    DM  labs:  HbA1C: Recent Labs    12/10/21 1157 04/18/22 0922  HGBA1C 6.6* 6.5*       CBG (last 3)  No results for input(s): "GLUCAP" in the last 72 hours.        Cultures: No results found for: "SDES", "SPECREQUEST", "CULT", "REPTSTATUS"   Radiological Exams on Admission: CT Angio Chest PE W and/or Wo Contrast  Result Date: 12/08/2022 CLINICAL DATA:  Pulmonary embolus suspected with high probability. Worsening shortness of breath.  EXAM: CT ANGIOGRAPHY CHEST WITH CONTRAST TECHNIQUE: Multidetector CT imaging of the chest was performed using the standard protocol during bolus administration of intravenous contrast. Multiplanar CT image reconstructions and MIPs were obtained to evaluate the vascular anatomy. RADIATION DOSE REDUCTION: This exam was performed according to the departmental dose-optimization program which includes automated exposure control, adjustment of the mA and/or kV according to patient size and/or use of iterative reconstruction technique. CONTRAST:  137m OMNIPAQUE IOHEXOL 350 MG/ML SOLN COMPARISON:  Chest radiograph 12/08/2022. Cardiac CT 06/18/2021. CT angio chest 09/19/2020 FINDINGS: Cardiovascular: There is good opacification of the central and segmental pulmonary arteries representing technically adequate study. No focal filling defects are identified. No evidence of significant pulmonary embolus. Normal heart size. No pericardial effusions. Normal caliber thoracic aorta. No evidence of aortic dissection. Calcification of the aorta and coronary arteries. Mediastinum/Nodes: Esophagus is decompressed. No significant lymphadenopathy. Thyroid gland is unremarkable. Lungs/Pleura: Emphysematous changes throughout the lungs with scarring in the lung apices. Focal area of patchy infiltration demonstrated in the left lingula and lower lung which could indicate early pneumonia. Right lung is clear. No pleural effusions. No pneumothorax. Upper Abdomen: Diffuse fatty infiltration of the liver. No acute process demonstrated in the upper abdomen. Musculoskeletal: Degenerative changes in the spine. Review of the MIP images confirms the above findings. IMPRESSION: 1. No evidence of significant pulmonary embolus. 2. Focal area of patchy infiltration demonstrated in the left lower lung and lingula likely representing an area of pneumonia. 3. Aortic atherosclerosis. 4. Emphysematous changes in the lungs. 5. Fatty infiltration of the liver.  Electronically Signed   By: WLucienne CapersM.D.   On: 12/08/2022 22:32   DG Chest 2 View  Result Date: 12/08/2022 CLINICAL DATA:  Dyspnea EXAM: CHEST - 2 VIEW COMPARISON:  12/06/2022 FINDINGS: The lungs are mildly hyperinflated paucity of vasculature within the lung apices in keeping with changes of emphysema. No superimposed confluent pulmonary infiltrate. No pneumothorax or pleural effusion. Cardiac size within normal limits. Pulmonary vascularity is normal. No acute bone abnormality. IMPRESSION: 1. No active cardiopulmonary disease. 2. Emphysema. Electronically Signed   By: AFidela SalisburyM.D.   On: 12/08/2022 20:53   _______________________________________________________________________________________________________ Latest  Blood pressure (!) 148/82, pulse 71, temperature 98.1 F (36.7 C), temperature source Oral, resp. rate 12, SpO2 95 %.   Vitals  labs and radiology finding personally reviewed  Review of Systems:    Pertinent positives include:  shortness of breath at rest. dyspnea on exertion, Constitutional:  No weight loss, night sweats, Fevers, chills, fatigue, weight loss  HEENT:  No headaches, Difficulty swallowing,Tooth/dental problems,Sore throat,  No  sneezing, itching, ear ache, nasal congestion, post nasal drip,  Cardio-vascular:  No chest pain, Orthopnea, PND, anasarca, dizziness, palpitations.no Bilateral lower extremity swelling  GI:  No heartburn, indigestion, abdominal pain, nausea, vomiting, diarrhea, change in bowel habits, loss of appetite, melena, blood in stool, hematemesis Resp:  no  No  No excess mucus, no productive cough, No non-productive cough, No coughing up of blood.No change in color of mucus.No wheezing. Skin:  no rash or lesions. No jaundice GU:  no dysuria, change in color of urine, no urgency or frequency. No straining to urinate.  No flank pain.  Musculoskeletal:  No joint pain or no joint swelling. No decreased range of motion. No back  pain.  Psych:  No change in mood or affect. No depression or anxiety. No memory loss.  Neuro: no localizing neurological complaints, no tingling, no weakness, no double vision, no gait abnormality, no slurred speech, no confusion  All systems reviewed and apart from [place] all are negative _______________________________________________________________________________________________ Past Medical History:   Past Medical History:  Diagnosis Date   Allergic rhinitis 02/25/2008   Qualifier: Diagnosis of  By: Esmeralda Arthur     Angina pectoris ([place]) 06/09/2021   Aortic atherosclerosis ([place]) 08/14/2017   Appreciated on CXR 08/14/2017   Asthma    Asthmatic bronchitis 12/07/2021   Atrophy of vagina 12/07/2021   Centrilobular emphysema ([place]) 05/28/2018   Controlled diabetes mellitus type 2 with complications ([place]) 123XX123   Has FLD.   COPD Gold C 11/02/2007   Formatting of this note might be different from the original. PFT 05/28/2019           ratio 50% FEV1 53% FVC 83%    DLCO 54% PFT 09/25/18 ([facility]) ratio 52% FEV1 56% FVC 108%; DLCO 69%   Coronary artery disease    Dysphagia 12/07/2021   Dyspnea on exertion 08/21/2018   Essential hypertension 12/07/2021   Facet arthropathy, lumbar 12/15/2021   Family history of osteoporosis 04/22/2019   Family history of rheumatoid arthritis 06/05/2019   Fatty liver disease, nonalcoholic    Ganglion of hand 12/07/2021   GERD 11/02/2007   Qualifier: Diagnosis of  By: Valetta Close DO, Karen     Hemorrhoids 12/07/2021   Hyperlipidemia associated with type 2 diabetes mellitus ([place]) 03/24/2020   Hypersomnia with sleep apnea 05/28/2018   HYPERTENSION, BENIGN 12/24/2008   Qualifier: Diagnosis of  By: Stanford Breed, MD, Kandyce Rud    Hypothyroidism 06/13/2007   ? [place].  Has strong family hx autoimmune d/o No h/o thyroidectomy or radioablation.    IBS (irritable bowel syndrome)    Lateral epicondylitis of left elbow 06/05/2019   Lung disease, bullous  ([place]) 08/14/2017   Menopause 12/07/2021   Obesity (BMI 30.0-34.9) 05/28/2018   Osteopenia of multiple sites 12/15/2021   Overactive bladder 06/09/2017   Pain in female genitalia on intercourse 12/07/2021   Polyp of colon 12/07/2021   Post-menopausal 04/22/2019   Precordial pain    Sees Dr Stanford Breed.  Last cardiac cath 2016 = negative. Strong family h/o MI and CAD   Psychophysiological insomnia 05/28/2018   Reactive depression 06/26/2018   Rosacea 08/01/2007   Qualifier: Diagnosis of  By: Valetta Close DO, Karen     Snoring 05/28/2018   Stage 2 moderate COPD by GOLD classification ([place]) 11/02/2007   Formatting of this note might be different from the original. PFT 05/28/2019           ratio 50% FEV1 53% FVC 83%    DLCO 54% PFT  09/25/18 ([facility]) ratio 52% FEV1 56% FVC 108%; DLCO 69%   Statin intolerance 04/10/2019   Stress incontinence in female 06/09/2017   UTI'S, RECURRENT 06/13/2007   Qualifier: Diagnosis of  By: Dorene Sorrow D insufficiency 12/15/2021      Past Surgical History:  Procedure Laterality Date   ABLATION     CARDIAC CATHETERIZATION N/A 06/12/2015   Procedure: Left Heart Cath and Coronary Angiography;  Surgeon: Burnell Blanks, MD;  Location: [facility];  Service: Cardiovascular;  Laterality: N/A;   CESAREAN SECTION     LTCS     x2   NASAL SINUS SURGERY     SPIROMETRY  01/05/2007   FVC 68% predicted; FEV1 44% predicted; FEV1/FVC 63% predicted = moderate obstruction with low vital capacity possibly due to restriction   SQUAMOUS CELL CARCINOMA EXCISION     TUBAL LIGATION      Social History:  Ambulatory   independently     reports that she quit smoking about 15 years ago. Her smoking use included cigarettes. She has a 15.00 pack-year smoking history. She has never used smokeless tobacco. She reports current alcohol use of about 1.0 standard drink of alcohol per week. She reports that she does not use drugs.     Family History:   Family  History  Problem Relation Age of Onset   Hypertension Father    Hyperlipidemia Father    Heart disease Father        Died at age 10 of MI   Hypothyroidism Father    Diabetes Maternal Grandmother    Raynaud syndrome Sister    Rheum arthritis Sister    Thyroid disease Sister    CAD Brother        has 4 stents   Rheum arthritis Brother    Pulmonary fibrosis Brother    Heart disease Sister 61       h/o MI @age$  78   Thyroid disease Sister    Heart disease Mother    Hypothyroidism Mother    Macular degeneration Mother    Breast cancer Neg Hx    Ovarian cancer Neg Hx    Prostate cancer Neg Hx    Colon cancer Neg Hx    Stomach cancer Neg Hx    Esophageal cancer Neg Hx    ______________________________________________________________________________________________ Allergies: Allergies  Allergen Reactions   Avelox [Moxifloxacin Hcl In Nacl] Hives   Ciprofloxacin     REACTION: Rash   Livalo [Pitavastatin]    Metformin And Related     kidney   Moxifloxacin     REACTION: RASH   Bactrim [Sulfamethoxazole-Trimethoprim] Rash   Statins Other (See Comments)    myalgias   Sulfamethoxazole-Trimethoprim Rash    REACTION: Rash     Prior to Admission medications   Medication Sig Start Date End Date Taking? Authorizing Provider  albuterol (PROVENTIL) (2.5 MG/3ML) 0.083% nebulizer solution Take 3 mLs (2.5 mg total) by nebulization every 6 (six) hours as needed for wheezing or shortness of breath. 12/06/22   Suzy Bouchard, PA-C  albuterol (VENTOLIN HFA) 108 (90 Base) MCG/ACT inhaler Inhale 2 puffs into the lungs every 6 (six) hours as needed for wheezing or shortness of breath. 04/22/22   [name], DO  benzonatate (TESSALON) 100 MG capsule Take 1 capsule (100 mg total) by mouth every 8 (eight) hours. 12/06/22   Suzy Bouchard, PA-C  Evolocumab (REPATHA) 140 MG/ML SOSY Inject 140 mg into the skin every  14 (fourteen) days. 07/11/22   Revankar, Reita Cliche, MD  furosemide  (LASIX) 20 MG tablet Take 20 mg by mouth daily. 08/05/22   [provider]  levofloxacin (LEVAQUIN) 750 MG tablet Take 750 mg by mouth daily. 11/29/22   [provider]  levothyroxine (SYNTHROID) 75 MCG tablet Take 1 tablet (75 mcg total) by mouth daily. 04/18/22   [name], DO  loratadine (CLARITIN) 10 MG tablet Take 10 mg by mouth daily.    [provider]  metoprolol succinate (TOPROL XL) 25 MG 24 hr tablet Take 1 tablet (25 mg total) by mouth daily. 08/03/22   Revankar, Reita Cliche, MD  Omeprazole-Sodium Bicarbonate (ZEGERID) 20-1100 MG CAPS capsule Take by mouth.    [provider]  umeclidinium-vilanterol (ANORO ELLIPTA) 62.5-25 MCG/ACT AEPB Inhale 1 puff into the lungs daily. 12/29/21   Collene Gobble, MD  Vitamin D, Ergocalciferol, (DRISDOL) 1.25 MG (50000 UNIT) CAPS capsule Take 1 capsule (50,000 Units total) by mouth every 7 (seven) days. 12/15/21   [name], DO    ___________________________________________________________________________________________________ Physical Exam:    12/08/2022   11:00 PM 12/08/2022   10:45 PM 12/08/2022   10:00 PM  Vitals with BMI  Systolic  [id number]  Diastolic  82 85  Pulse 71 77 80     1. General:  in No  Acute distress   Chronically ill  -appearing 2. Psychological: Alert and   Oriented 3. Head/ENT:    Dry Mucous Membranes                          Head Non traumatic, neck supple                          Poor Dentition 4. SKIN: decreased Skin turgor,  Skin clean Dry and intact no rash 5. Heart: Regular rate and rhythm no  Murmur, no Rub or gallop 6. Lungs:  still occasional  wheezes or crackles   7. Abdomen: Soft,  non-tender, Non distended bowel sounds present 8. Lower extremities: no clubbing, cyanosis, no  edema 9. Neurologically Grossly intact, moving all 4 extremities equally   10. MSK: Normal range of motion    Chart has been  reviewed  ______________________________________________________________________________________________  Assessment/Plan 67 y.o. female with medical history significant of COPD on chronic oxygen, HTN, HLD Dm2, Hypothyrodism  Admitted for   Pneumonia due to infectious organism, unspecified laterality, unspecified part of lung Hypoxia, Elevated troponin     Present on Admission:  CAP (community acquired pneumonia)  Hypothyroidism  HYPERTENSION, BENIGN  Precordial pain  Controlled diabetes mellitus type 2 with complications ([place])  Hyperlipidemia associated with type 2 diabetes mellitus (HCC)  Angina pectoris (HCC)  Acute on chronic respiratory failure with hypoxia (HCC)  COPD with acute exacerbation (HCC)  Hemoptysis  Elevated troponin  Lactic acid increased     CAP (community acquired pneumonia)  - -Patient presenting with  productive cough,  hypoxia  , and infiltrate  on chest CT -Infiltrate on CXR and 2-3 characteristics (fever, leukocytosis, purulent sputum) are consistent with pneumonia. -This appears to be most likely community-acquired pneumonia.      will admit for treatment of CAP will start on appropriate antibiotic coverage. - Rocephin/azithromycin   Obtain:  sputum cultures,                  Obtain respiratory panel  influenza serologies negative                  COVID PCR negative                      sputum cultures ordered                   strep pneumo UA antigen,                    check for Legionella antigen.                Provide oxygen as needed.    Hypothyroidism Chronic stable cont synthroid at 75 mcg PO q day  check TSH  HYPERTENSION, BENIGN Hold metoprolol given COPD exacerbation   Precordial pain Given elevated troponin will admit to [facility] Cardiology is aware  Hold off on anticoagulation given recent hemoptysis  Recent negative Cardiac cath  Will obtain echo in AM  Currently CP free Cardiology will see in consult on  arrival    Controlled diabetes mellitus type 2 with complications ([place]) Order sliding scale diet controlled anticipate some degree of hypoglycemia in the setting of recent steroid use  Hyperlipidemia associated with type 2 diabetes mellitus ([place]) On Repatha  Angina pectoris ([place]) Appreciate cardiology consult chronic chest pain-free continue to cycle cardiac enzymes echogram in the morning.  Hold off on anticoagulation given recent hemoptysis  Acute on chronic respiratory failure with hypoxia (HCC) In the setting of COPD exacerbation and pneumonia.  Noted to be hypoxic at home with sats down to 70s.  COPD with acute exacerbation ([place])  -  - Will initiate: Steroid taper  -  Antibiotics Rocephin azithromycin - Albuterol  PRN, - scheduled duoneb,  -  Breo or Dulera at discharge   -  Mucinex.  Titrate O2 to saturation >90%. Follow patients respiratory status.  Order VBG  -   Currently mentating well no evidence of symptomatic hypercarbia Pulmonology to see in AM  Hemoptysis Isolated event in the setting of pneumonia/COPD exacerbation CTA negative for PE.  Hold off on aspirin/heparin for right now continue to carefully monitor and progressive  Elevated troponin Obtain echogram in a.m. appreciate cardiology consult continue to cycle cardiac enzymes  Lactic acid increased Rehydrate and recheck Suspect in the setting of hypoxia and increased work of breathing  States clinically doing better      Other plan as per orders.  DVT prophylaxis:  SCD     Code Status:    Code Status: Prior FULL CODE  as per patient   I had personally discussed CODE STATUS with patient     Family Communication:   Family  at  Bedside discussed with son    Disposition Plan:      To home once workup is complete and patient is stable   Following barriers for discharge:                                                       Will need consultants to evaluate patient prior to discharge                        Would benefit from PT/OT eval prior to DC  Ordered  Consults called: Cardiology is aware  Admission status:  ED Disposition     ED Disposition  Admit   Condition  --   [place]: [place] [[id number]]  Level of Care: Progressive [102]  Admit to Progressive based on following criteria: CARDIOVASCULAR & THORACIC of moderate stability with acute coronary syndrome symptoms/low risk myocardial infarction/hypertensive urgency/arrhythmias/heart failure potentially compromising stability and stable post cardiovascular intervention patients.  May admit patient to [facility] or [facility] if equivalent level of care is available:: No  Covid Evaluation: Asymptomatic - no recent exposure (last 10 days) testing not required  Diagnosis: CAP (community acquired pneumonia) [id number]  Admitting Physician: Toy Baker [id number]  Attending Physician: Toy Baker [id number]  Certification:: I certify this patient will need inpatient services for at least 2 midnights  Estimated Length of Stay: 2           inpatient     I Expect 2 midnight stay secondary to severity of patient's current illness need for inpatient interventions justified by the following:  hemodynamic instability despite optimal treatment ( hypoxia, )   Severe lab/radiological/exam abnormalities including:   COPD exacerbation and pneumonia and extensive comorbidities including:  DM2    CHF   CAD   COPD/asthma  Obesity    That are currently affecting medical management.   I expect  patient to be hospitalized for 2 midnights requiring inpatient medical care.  Patient is at high risk for adverse outcome (such as loss of life or disability) if not treated.  Indication for inpatient stay as follows:    New or worsening hypoxia   Need for IV antibiotics,      Level of care           progressive tele indefinitely please discontinue once patient no longer  qualifies COVID-19 Labs    Lab Results  Component Value Date   [place] 12/08/2022     Precautions: admitted as   Covid Negative   Bradon Fester 12/09/2022, 12:08 AM    [facility]     after 2 AM please page floor coverage PA If 7AM-7PM, please contact the day team taking care of the patient using [email or web address]   Patient was evaluated in the context of the global COVID-19 pandemic, which necessitated consideration that the patient might be at risk for infection with the SARS-CoV-2 virus that causes COVID-19. Institutional protocols and algorithms that pertain to the evaluation of patients at risk for COVID-19 are in a state of rapid change based on information released by regulatory bodies including the CDC and federal and state organizations. These policies and algorithms were followed during the patient's care.    Because it is abnormal MRISynthCheck TSHroid at 75 mcg p.o. daily

## 2022-12-08 NOTE — ED Triage Notes (Signed)
Patient arrived with complaints of shortness of breath, chest pain, and coughing up a "rust colored sputum" states seen at [facility] two days ago for same and has had no relief. 2L of oxygen at baseline. Hx of COPD

## 2022-12-08 NOTE — Telephone Encounter (Signed)
Patient would like the nurse to call regarding her condition.  She stated she is spitting up rusty color mucus and she believes she should probably go to the hospital.  She has an appt. On 3/27, but wanted to know if Dr. Lamonte Sakai has admitting privileges.  Please advise and call patient back to discuss further.  CB# [phone]

## 2022-12-08 NOTE — Assessment & Plan Note (Signed)
Obtain echogram in a.m. appreciate cardiology consult continue to cycle cardiac enzymes

## 2022-12-08 NOTE — ED Provider Triage Note (Signed)
Emergency Medicine Provider Triage Evaluation Note  Stacy [name] , a 67 y.o. female  was evaluated in triage.  Pt complains of worsening SOB. The patient report that her sputum is more purulent. She is on her last dose of Levaquin and feels worse. She had 72m of prednisone today. Reports some chest pain. Denies any fevers. On 2L chronic O2.  Review of Systems  Positive:  Negative:   Physical Exam  BP 138/88   Pulse 80   Temp 98.1 F (36.7 C) (Oral)   Resp 16   SpO2 99%  Gen:   Awake, no distress   Resp:  Normal effort, expiratory wheezing with prolonged phase. MSK:   Moves extremities without difficulty  Other:    Medical Decision Making  Medically screening exam initiated at 9:13 PM.  Appropriate orders placed.  Stacy SHERKwas informed that the remainder of the evaluation will be completed by another provider, this initial triage assessment does not replace that evaluation, and the importance of remaining in the ED until their evaluation is complete.  Labs ordered   Stacy [name] PVermont02/15/24 [id number]

## 2022-12-08 NOTE — Assessment & Plan Note (Signed)
On Repatha 

## 2022-12-08 NOTE — Assessment & Plan Note (Signed)
Given elevated troponin will admit to [facility] Cardiology is aware  Hold off on anticoagulation given recent hemoptysis  Recent negative Cardiac cath  Will obtain echo in AM  Currently CP free Cardiology will see in consult on arrival

## 2022-12-08 NOTE — Assessment & Plan Note (Signed)
Isolated event in the setting of pneumonia/COPD exacerbation CTA negative for PE.  Hold off on aspirin/heparin for right now continue to carefully monitor and progressive

## 2022-12-08 NOTE — Subjective & Objective (Signed)
Pt with known hx of COPD presented to PCP w 2wk hx of COUgh wheezing, SOB, yellow sputum She has oxygen at home but only uses as needed but has to be using it continuously 2 to 3 L to keep O2 in the 90s.  She was seen at urgent care and was started on Levaquin and prednisone then had to go to ER on the 13th for COPD exacerbation at that time chest x-ray showed mild bibasilar opacities felt to be atelectasis respiratory panel was negative she was given IV steroids and DuoNeb treatment and was able to go home but progressed since then.  She has pulmonologist but did not consult them Patient became so short of breath she had hard time speaking.  Also endorses history of rusty colored sputum she laid on attempted to call pulmonology but was told that she needs to go to emergency department

## 2022-12-08 NOTE — Assessment & Plan Note (Signed)
- -  Patient presenting with  productive cough,  hypoxia  , and infiltrate  on chest CT -Infiltrate on CXR and 2-3 characteristics (fever, leukocytosis, purulent sputum) are consistent with pneumonia. -This appears to be most likely community-acquired pneumonia.      will admit for treatment of CAP will start on appropriate antibiotic coverage. - Rocephin/azithromycin   Obtain:  sputum cultures,                  Obtain respiratory panel                    influenza serologies negative                  COVID PCR negative                      sputum cultures ordered                   strep pneumo UA antigen,                    check for Legionella antigen.                Provide oxygen as needed.

## 2022-12-08 NOTE — Assessment & Plan Note (Signed)
Hold metoprolol given COPD exacerbation

## 2022-12-08 NOTE — Assessment & Plan Note (Signed)
Appreciate cardiology consult chronic chest pain-free continue to cycle cardiac enzymes echogram in the morning.  Hold off on anticoagulation given recent hemoptysis

## 2022-12-08 NOTE — Assessment & Plan Note (Addendum)
-  -   Will initiate: Steroid taper  -  Antibiotics Rocephin azithromycin - Albuterol  PRN, - scheduled duoneb,  -  Breo or Dulera at discharge   -  Mucinex.  Titrate O2 to saturation >90%. Follow patients respiratory status.  Order VBG  -   Currently mentating well no evidence of symptomatic hypercarbia Pulmonology to see in AM

## 2022-12-08 NOTE — Progress Notes (Signed)
Established Patient Office Visit  Subjective   Patient ID: Stacy [name], female    DOB: Mar 09, 1956  Age: 67 y.o. MRN: [id number]  Chief Complaint  Patient presents with   Cough    HPI [name] is here for an ER follow up for a COPD exacerbation. She reports increased shortness of breath, wheezing, and productive cough for 2 weeks. Reprots yellow/rush colored sputum. She has had to wear her oxygen continuously at 2-3L to keep her oxygen saturation over 90%. She has been seen at [facility] and was started on Levaquin and prednisone. She was then seen in the ER on 12/06/22 for COPD exacerbation. She had a CXR that showed mild bibasilar opacities- likely atelectasis and hyperinflation. She had unremarkable CBC. Blood glucose was elevated but no anion gaps. Normal troponin and EKG with NSR. She had a negative respiratory panel. She was given a duoneb treatment and IV steroids.  She was instructed to continue levoquin and oral prednisone. She reports that this has not been improving despite these treatments. She has been taking anoro as prescribed and albuterol prn. She reports that she has tried trelegy and ellipta in the past but that anoro works best for her. She is established with pulmonology but has not called them.      ROS As per HPI.    Objective:     BP 131/74   Pulse 83   Temp (!) 97.2 F (36.2 C) (Temporal)   Ht 5' 3.5" (1.613 m)   Wt 149 lb 8 oz (67.8 kg)   SpO2 94%   BMI 26.07 kg/m    Physical Exam Vitals and nursing note reviewed.  Constitutional:      General: She is not in acute distress.    Appearance: She is not ill-appearing, toxic-appearing or diaphoretic.  Cardiovascular:     Rate and Rhythm: Normal rate and regular rhythm.     Heart sounds: Normal heart sounds. No murmur heard. Pulmonary:     Effort: No respiratory distress.     Breath sounds: Examination of the right-middle field reveals rhonchi. Examination of the left-middle field reveals rhonchi. Examination of  the right-lower field reveals rhonchi. Examination of the left-lower field reveals rhonchi. Rhonchi present. No wheezing or rales.  Musculoskeletal:     Cervical back: Neck supple. No rigidity.     Right lower leg: No edema.     Left lower leg: No edema.  Skin:    General: Skin is warm and dry.  Neurological:     General: No focal deficit present.     Mental Status: She is alert and oriented to person, place, and time.  Psychiatric:        Mood and Affect: Mood normal.        Behavior: Behavior normal.      No results found for any visits on 12/08/22.    The 10-year ASCVD risk score (Arnett DK, et al., 2019) is: 20.2%    Assessment & Plan:   Nadyne was seen today for cough.  Diagnoses and all orders for this visit:  COPD exacerbation ([place]) Uncontrolled. Reviewed ER note, imaging, and labs. She has been treated with levaquin, prednisone, anoro ellipta, and albuterol without improvement. She declined breztri or trelegy inhaler today as she reports anoro ellipta works best for her. She is on 3L of 02 continuously. Recommended that she call pulmonology ASAP for recommendations. Instructed to ER if symptoms worsen or persist as she is not responding to outpatient treatment.  The patient indicates understanding of these issues and agrees with the plan.   Gwenlyn Perking, FNP

## 2022-12-08 NOTE — Assessment & Plan Note (Signed)
Order sliding scale diet controlled anticipate some degree of hypoglycemia in the setting of recent steroid use

## 2022-12-08 NOTE — ED Provider Notes (Signed)
Stacy [name]   CSN: [id number] Arrival date & time: 12/08/22  2011     History  Chief Complaint  Patient presents with   Shortness of Breath         Stacy [name] is a 67 y.o. female.  The history is provided by the patient and medical records. No language interpreter was used.  Shortness of Breath Severity:  Severe Onset quality:  Gradual Duration:  4 days Timing:  Constant Progression:  Worsening Chronicity:  New Context: URI   Relieved by:  Nothing Worsened by:  Deep breathing and coughing Ineffective treatments:  Oxygen Associated symptoms: chest pain, cough, hemoptysis, sputum production and wheezing   Associated symptoms: no abdominal pain, no diaphoresis, no fever, no headaches, no neck pain, no rash and no vomiting   Risk factors: no hx of PE/DVT        Home Medications Prior to Admission medications   Medication Sig Start Date End Date Taking? Authorizing Provider  albuterol (PROVENTIL) (2.5 MG/3ML) 0.083% nebulizer solution Take 3 mLs (2.5 mg total) by nebulization every 6 (six) hours as needed for wheezing or shortness of breath. 12/06/22   Suzy Bouchard, PA-C  albuterol (VENTOLIN HFA) 108 (90 Base) MCG/ACT inhaler Inhale 2 puffs into the lungs every 6 (six) hours as needed for wheezing or shortness of breath. 04/22/22   [name], DO  benzonatate (TESSALON) 100 MG capsule Take 1 capsule (100 mg total) by mouth every 8 (eight) hours. 12/06/22   Suzy Bouchard, PA-C  Evolocumab (REPATHA) 140 MG/ML SOSY Inject 140 mg into the skin every 14 (fourteen) days. 07/11/22   Revankar, Reita Cliche, MD  furosemide (LASIX) 20 MG tablet Take 20 mg by mouth daily. 08/05/22   [provider]  levofloxacin (LEVAQUIN) 750 MG tablet Take 750 mg by mouth daily. 11/29/22   [provider]  levothyroxine (SYNTHROID) 75 MCG tablet Take 1 tablet (75 mcg total) by mouth daily. 04/18/22    [name], DO  loratadine (CLARITIN) 10 MG tablet Take 10 mg by mouth daily.    [provider]  metoprolol succinate (TOPROL XL) 25 MG 24 hr tablet Take 1 tablet (25 mg total) by mouth daily. 08/03/22   Revankar, Reita Cliche, MD  Omeprazole-Sodium Bicarbonate (ZEGERID) 20-1100 MG CAPS capsule Take by mouth.    [provider]  umeclidinium-vilanterol (ANORO ELLIPTA) 62.5-25 MCG/ACT AEPB Inhale 1 puff into the lungs daily. 12/29/21   Collene Gobble, MD  Vitamin D, Ergocalciferol, (DRISDOL) 1.25 MG (50000 UNIT) CAPS capsule Take 1 capsule (50,000 Units total) by mouth every 7 (seven) days. 12/15/21   [name], DO      Allergies    Avelox [moxifloxacin hcl in nacl], Ciprofloxacin, Livalo [pitavastatin], Metformin and related, Moxifloxacin, Bactrim [sulfamethoxazole-trimethoprim], Statins, and Sulfamethoxazole-trimethoprim    Review of Systems   Review of Systems  Constitutional:  Positive for chills and fatigue. Negative for diaphoresis and fever.  HENT:  Negative for congestion.   Eyes:  Negative for visual disturbance.  Respiratory:  Positive for cough, hemoptysis, sputum production, chest tightness, shortness of breath and wheezing.   Cardiovascular:  Positive for chest pain. Negative for palpitations and leg swelling.  Gastrointestinal:  Positive for nausea. Negative for abdominal pain, constipation, diarrhea and vomiting.  Genitourinary:  Negative for dysuria and flank pain.  Musculoskeletal:  Negative for back pain and neck pain.  Skin:  Negative for rash and wound.  Neurological:  Negative for syncope, weakness, light-headedness, numbness and headaches.  Psychiatric/Behavioral:  Negative for agitation.   All other systems reviewed and are negative.   Physical Exam Updated Vital Signs BP 138/88   Pulse 80   Temp 98.1 F (36.7 C) (Oral)   Resp 16   SpO2 99%  Physical Exam Vitals and nursing [name] reviewed.  Constitutional:      General: She is  not in acute distress.    Appearance: She is well-developed. She is ill-appearing. She is not toxic-appearing or diaphoretic.  HENT:     Head: Normocephalic and atraumatic.     Mouth/Throat:     Mouth: Mucous membranes are moist.  Eyes:     Conjunctiva/sclera: Conjunctivae normal.     Pupils: Pupils are equal, round, and reactive to light.  Cardiovascular:     Rate and Rhythm: Regular rhythm. Tachycardia present.     Pulses: Normal pulses.     Heart sounds: No murmur heard. Pulmonary:     Effort: Pulmonary effort is normal. Tachypnea present. No respiratory distress.     Breath sounds: Wheezing and rhonchi present. No rales.  Chest:     Chest wall: No tenderness.  Abdominal:     Palpations: Abdomen is soft.     Tenderness: There is no abdominal tenderness.  Musculoskeletal:        General: No swelling.     Cervical back: Neck supple.     Right lower leg: No edema.     Left lower leg: No edema.  Skin:    General: Skin is warm and dry.     Capillary Refill: Capillary refill takes less than 2 seconds.     Findings: No erythema.  Neurological:     General: No focal deficit present.     Mental Status: She is alert.  Psychiatric:        Mood and Affect: Mood is anxious.     ED Results / Procedures / Treatments   Labs (all labs ordered are listed, but only abnormal results are displayed) Labs Reviewed  CBC - Abnormal; Notable for the following components:      Result Value   WBC 17.2 (*)    Hemoglobin 15.3 (*)    All other components within normal limits  COMPREHENSIVE METABOLIC PANEL - Abnormal; Notable for the following components:   Glucose, Bld 238 (*)    Creatinine, Ser 1.01 (*)    AST 53 (*)    ALT 50 (*)    All other components within normal limits  BRAIN NATRIURETIC PEPTIDE - Abnormal; Notable for the following components:   B Natriuretic Peptide 190.0 (*)    All other components within normal limits  TROPONIN I (HIGH SENSITIVITY) - Abnormal; Notable for the  following components:   Troponin I (High Sensitivity) 579 (*)    All other components within normal limits  RESP PANEL BY RT-PCR (RSV, FLU A&B, COVID)  RVPGX2  CULTURE, BLOOD (ROUTINE X 2)  CULTURE, BLOOD (ROUTINE X 2)  LACTIC ACID, PLASMA  LACTIC ACID, PLASMA  TROPONIN I (HIGH SENSITIVITY)    EKG EKG Interpretation  Date/Time:  Thursday December 08 2022 20:28:50 EST Ventricular Rate:  72 PR Interval:  135 QRS Duration: 86 QT Interval:  415 QTC Calculation: 455 R Axis:   41 Text Interpretation: Sinus rhythm when comp(ared to prior, similar appearance. No STEMI Confirmed by Antony Blackbird [phone]) on 12/08/2022 9:19:30 PM  Radiology CT Angio Chest PE W and/or Wo Contrast  Result  Date: 12/08/2022 CLINICAL DATA:  Pulmonary embolus suspected with high probability. Worsening shortness of breath. EXAM: CT ANGIOGRAPHY CHEST WITH CONTRAST TECHNIQUE: Multidetector CT imaging of the chest was performed using the standard protocol during bolus administration of intravenous contrast. Multiplanar CT image reconstructions and MIPs were obtained to evaluate the vascular anatomy. RADIATION DOSE REDUCTION: This exam was performed according to the departmental dose-optimization program which includes automated exposure control, adjustment of the mA and/or kV according to patient size and/or use of iterative reconstruction technique. CONTRAST:  116m OMNIPAQUE IOHEXOL 350 MG/ML SOLN COMPARISON:  Chest radiograph 12/08/2022. Cardiac CT 06/18/2021. CT angio chest 09/19/2020 FINDINGS: Cardiovascular: There is good opacification of the central and segmental pulmonary arteries representing technically adequate study. No focal filling defects are identified. No evidence of significant pulmonary embolus. Normal heart size. No pericardial effusions. Normal caliber thoracic aorta. No evidence of aortic dissection. Calcification of the aorta and coronary arteries. Mediastinum/Nodes: Esophagus is decompressed. No  significant lymphadenopathy. Thyroid gland is unremarkable. Lungs/Pleura: Emphysematous changes throughout the lungs with scarring in the lung apices. Focal area of patchy infiltration demonstrated in the left lingula and lower lung which could indicate early pneumonia. Right lung is clear. No pleural effusions. No pneumothorax. Upper Abdomen: Diffuse fatty infiltration of the liver. No acute process demonstrated in the upper abdomen. Musculoskeletal: Degenerative changes in the spine. Review of the MIP images confirms the above findings. IMPRESSION: 1. No evidence of significant pulmonary embolus. 2. Focal area of patchy infiltration demonstrated in the left lower lung and lingula likely representing an area of pneumonia. 3. Aortic atherosclerosis. 4. Emphysematous changes in the lungs. 5. Fatty infiltration of the liver. Electronically Signed   By: WLucienne CapersM.D.   On: 12/08/2022 22:32   DG Chest 2 View  Result Date: 12/08/2022 CLINICAL DATA:  Dyspnea EXAM: CHEST - 2 VIEW COMPARISON:  12/06/2022 FINDINGS: The lungs are mildly hyperinflated paucity of vasculature within the lung apices in keeping with changes of emphysema. No superimposed confluent pulmonary infiltrate. No pneumothorax or pleural effusion. Cardiac size within normal limits. Pulmonary vascularity is normal. No acute bone abnormality. IMPRESSION: 1. No active cardiopulmonary disease. 2. Emphysema. Electronically Signed   By: AFidela SalisburyM.D.   On: 12/08/2022 20:53    Procedures Procedures    Medications Ordered in ED Medications  magnesium sulfate IVPB 2 g 50 mL (2 g Intravenous New Bag/Given 12/08/22 2146)  ipratropium-albuterol (DUONEB) 0.5-2.5 (3) MG/3ML nebulizer solution 3 mL (3 mLs Nebulization Given 12/08/22 2148)  methylPREDNISolone sodium succinate (SOLU-MEDROL) 125 mg/2 mL injection 125 mg (125 mg Intravenous Given 12/08/22 2147)  iohexol (OMNIPAQUE) 350 MG/ML injection 100 mL (100 mLs Intravenous Contrast Given  12/08/22 2217)    ED Course/ Medical Decision Making/ A&P                             Medical Decision Making Amount and/or Complexity of Data Reviewed Labs: ordered. Radiology: ordered.  Risk Prescription drug management. Decision regarding hospitalization.    JARNETA MAGOis a 67y.o. female with a past medical history significant for hypertension, asthma, CAD, COPD with intermittent home oxygen use, CAD, GERD, and currently on steroids and antibiotics for persistent coughing and COPD exacerbation who presents with worsened symptoms with hypoxia.  According to patient, she was seen in the hospital several days ago and was able to go home.  She reports that despite taking antibiotics and steroids her symptoms have progressed and worsened.  At home when she takes her oxygen off and tries to ambulate her sats go into the 70s.  She reports she normally has never used oxygen except for once or twice in several months.  She is now currently on oxygen all the time and when she takes it off she desaturates.  She reports her breathing is worse and she is having chest pressure and chest tightness.  This is new.  She denies any nausea or vomiting, constipation, diarrhea, or urinary changes.  No leg pain or leg swelling and no history of blood clots reported.  She reports now she is having a phlegm like sputum and is coughing up some blood discolored sputum.  She does not feel safe at home with symptoms worsening.  When I was speaking with patient her oxygen saturations dropped into the 80s even on the 2 L.  On exam, patient does have wheezing and coarse breath sounds diffusely.  Chest was nontender to palpation and I did not appreciate a murmur.  Intact pulses in extremities.  Legs nontender not critically edematous.  Back nontender.  Patient is slightly tachypneic during my evaluation.  EKG does not show STEMI.  Clinically I am concerned about worsening pneumonia versus pulmonary embolism given the  discomfort and now the possible hemoptysis.  Will get CT PE study to get a better look and get screening labs.  Will make sure she is not having a cardiac cause of symptoms with troponin and BNP.  Due to her worsening symptoms despite home management and the oxygen saturations going into the 70s at home, I do feel patient will likely need admission when workup is completed.  Anticipate admission after workup is done.  Workup began to return.  Chest x-ray showed emphysema but otherwise no acute cardiopulmonary disease.  CT PE study was performed showing evidence of pulm embolism but does show areas of pneumonia.  Blood work shows a leukocytosis of 17.2 which is new and her troponin is also much more elevated at 579.  Due to her crushing chest discomfort, will call cardiology to discuss a plan for NSTEMI versus demand ischemia in the setting of pneumonia.   10:48 PM Spoke to cardiology who feels this is likely myocardial injury in the setting of this pneumonia.  He recommends admission to [facility] and consult cardiology when she gets there.  The hemoptysis he will hold on heparinization at this time and agrees with medicine admission.  Will order antibiotics and call for admission.  If symptoms were to drastically change or worsen, he would want ED transfer but he does not feel it is needed now given her improvement in symptoms and her lack of chest pain currently.          Final Clinical Impression(s) / ED Diagnoses Final diagnoses:  Pneumonia due to infectious organism, unspecified laterality, unspecified part of lung  Hypoxia  Elevated troponin    Clinical Impression: 1. Pneumonia due to infectious organism, unspecified laterality, unspecified part of lung   2. Hypoxia   3. Elevated troponin     Disposition: Admit  This [name] was prepared with assistance of Dragon voice recognition software. Occasional wrong-word or sound-a-like substitutions may have occurred due to the  inherent limitations of voice recognition software.     Dacie Mandel, Gwenyth Allegra, MD 12/08/22 [phone]

## 2022-12-08 NOTE — Telephone Encounter (Signed)
Spoke with the patient.  She is completing levaquin and a prednisone taper.  Purulent sputum, some desaturations.  I believe we are doing everything we can do as an outpatient.  I recommended that if she continues to have the symptoms or certainly if she worsens in any way then she needs to be seen in the ED and probably be admitted for IV therapy.  She voiced understanding and will go be seen if needed.

## 2022-12-09 ENCOUNTER — Other Ambulatory Visit: Payer: Self-pay

## 2022-12-09 ENCOUNTER — Other Ambulatory Visit: Payer: Self-pay | Admitting: Cardiology

## 2022-12-09 ENCOUNTER — Encounter (HOSPITAL_COMMUNITY): Payer: Self-pay | Admitting: Internal Medicine

## 2022-12-09 ENCOUNTER — Inpatient Hospital Stay (HOSPITAL_COMMUNITY): Payer: Medicare Other

## 2022-12-09 DIAGNOSIS — J9620 Acute and chronic respiratory failure, unspecified whether with hypoxia or hypercapnia: Secondary | ICD-10-CM

## 2022-12-09 DIAGNOSIS — R7989 Other specified abnormal findings of blood chemistry: Secondary | ICD-10-CM

## 2022-12-09 DIAGNOSIS — J441 Chronic obstructive pulmonary disease with (acute) exacerbation: Secondary | ICD-10-CM | POA: Diagnosis not present

## 2022-12-09 DIAGNOSIS — J189 Pneumonia, unspecified organism: Secondary | ICD-10-CM | POA: Diagnosis not present

## 2022-12-09 DIAGNOSIS — J9610 Chronic respiratory failure, unspecified whether with hypoxia or hypercapnia: Secondary | ICD-10-CM | POA: Diagnosis present

## 2022-12-09 DIAGNOSIS — E872 Acidosis, unspecified: Secondary | ICD-10-CM | POA: Diagnosis present

## 2022-12-09 DIAGNOSIS — J9601 Acute respiratory failure with hypoxia: Secondary | ICD-10-CM | POA: Diagnosis present

## 2022-12-09 DIAGNOSIS — I200 Unstable angina: Secondary | ICD-10-CM

## 2022-12-09 DIAGNOSIS — R0602 Shortness of breath: Secondary | ICD-10-CM

## 2022-12-09 HISTORY — DX: Acute and chronic respiratory failure, unspecified whether with hypoxia or hypercapnia: J96.20

## 2022-12-09 HISTORY — DX: Acidosis, unspecified: E87.20

## 2022-12-09 LAB — RESPIRATORY PANEL BY PCR

## 2022-12-09 LAB — ECHOCARDIOGRAM COMPLETE
AR max vel: 2.48 cm2
AV Area VTI: 2.15 cm2
AV Area mean vel: 2.25 cm2
AV Mean grad: 4 mmHg
AV Peak grad: 6.7 mmHg
Ao pk vel: 1.29 m/s
Area-P 1/2: 3.6 cm2
Calc EF: 60.5 %
Height: 63.5 in
S' Lateral: 2.8 cm
Single Plane A2C EF: 62.7 %
Single Plane A4C EF: 57.9 %
Weight: 2392 oz

## 2022-12-09 LAB — CBG MONITORING, ED
Glucose-Capillary: 256 mg/dL — ABNORMAL HIGH (ref 70–99)
Glucose-Capillary: 178 mg/dL — ABNORMAL HIGH (ref 70–99)
Glucose-Capillary: 264 mg/dL — ABNORMAL HIGH (ref 70–99)

## 2022-12-09 LAB — COMPREHENSIVE METABOLIC PANEL
ALT: 41 U/L (ref 0–44)
AST: 34 U/L (ref 15–41)
Albumin: 3.8 g/dL (ref 3.5–5.0)
Alkaline Phosphatase: 77 U/L (ref 38–126)
Anion gap: 10 (ref 5–15)
BUN: 12 mg/dL (ref 8–23)
CO2: 26 mmol/L (ref 22–32)
Calcium: 9 mg/dL (ref 8.9–10.3)
Chloride: 104 mmol/L (ref 98–111)
Creatinine, Ser: 0.81 mg/dL (ref 0.44–1.00)
GFR, Estimated: >60 mL/min (ref >60)
Glucose, Bld: 174 mg/dL — ABNORMAL HIGH (ref 70–99)
Potassium: 4.1 mmol/L (ref 3.5–5.1)
Sodium: 140 mmol/L (ref 135–145)
Total Bilirubin: 0.5 mg/dL (ref 0.3–1.2)
Total Protein: 6.9 g/dL (ref 6.5–8.1)

## 2022-12-09 LAB — HIV ANTIBODY (ROUTINE TESTING W REFLEX): HIV Screen 4th Generation wRfx: NONREACTIVE

## 2022-12-09 LAB — CBC
HCT: 46.6 % — ABNORMAL HIGH (ref 36.0–46.0)
Hemoglobin: 15.2 g/dL — ABNORMAL HIGH (ref 12.0–15.0)
MCH: 31.9 pg (ref 26.0–34.0)
MCHC: 32.6 g/dL (ref 30.0–36.0)
MCV: 97.7 fL (ref 80.0–100.0)
Platelets: 306 10*3/uL (ref 150–400)
RBC: 4.77 MIL/uL (ref 3.87–5.11)
RDW: 12.6 % (ref 11.5–15.5)
WBC: 17.5 10*3/uL — ABNORMAL HIGH (ref 4.0–10.5)
nRBC: 0 % (ref 0.0–0.2)

## 2022-12-09 LAB — GLUCOSE, CAPILLARY
Glucose-Capillary: 180 mg/dL — ABNORMAL HIGH (ref 70–99)
Glucose-Capillary: 211 mg/dL — ABNORMAL HIGH (ref 70–99)

## 2022-12-09 LAB — STREP PNEUMONIAE URINARY ANTIGEN: Strep Pneumo Urinary Antigen: NEGATIVE

## 2022-12-09 LAB — LACTIC ACID, PLASMA
Lactic Acid, Venous: 2.1 mmol/L (ref 0.5–1.9)
Lactic Acid, Venous: 3.7 mmol/L (ref 0.5–1.9)
Lactic Acid, Venous: 4.2 mmol/L (ref 0.5–1.9)
Lactic Acid, Venous: 2.6 mmol/L (ref 0.5–1.9)
Lactic Acid, Venous: 2.8 mmol/L (ref 0.5–1.9)

## 2022-12-09 LAB — TROPONIN I (HIGH SENSITIVITY)
Troponin I (High Sensitivity): 413 ng/L (ref <18)
Troponin I (High Sensitivity): 260 ng/L (ref <18)
Troponin I (High Sensitivity): 250 ng/L (ref <18)

## 2022-12-09 LAB — PROCALCITONIN
Procalcitonin: <0.1 ng/mL
Procalcitonin: <0.1 ng/mL

## 2022-12-09 LAB — BLOOD GAS, VENOUS
Acid-Base Excess: 5 mmol/L — ABNORMAL HIGH (ref 0.0–2.0)
Bicarbonate: 29.9 mmol/L — ABNORMAL HIGH (ref 20.0–28.0)
O2 Saturation: 72.8 %
Patient temperature: 37
pCO2, Ven: 44 mmHg (ref 44–60)
pH, Ven: 7.44 — ABNORMAL HIGH (ref 7.25–7.43)
pO2, Ven: 39 mmHg (ref 32–45)

## 2022-12-09 LAB — MAGNESIUM
Magnesium: 3.7 mg/dL — ABNORMAL HIGH (ref 1.7–2.4)
Magnesium: 2.8 mg/dL — ABNORMAL HIGH (ref 1.7–2.4)

## 2022-12-09 LAB — HEMOGLOBIN A1C
Hgb A1c MFr Bld: 6.8 % — ABNORMAL HIGH (ref 4.8–5.6)
Mean Plasma Glucose: 148.46 mg/dL

## 2022-12-09 LAB — PHOSPHORUS
Phosphorus: 2.8 mg/dL (ref 2.5–4.6)
Phosphorus: 3.2 mg/dL (ref 2.5–4.6)

## 2022-12-09 LAB — TSH: TSH: 1.173 u[IU]/mL (ref 0.350–4.500)

## 2022-12-09 LAB — CK: Total CK: 144 U/L (ref 38–234)

## 2022-12-09 MED ORDER — SODIUM CHLORIDE 0.9 % IV BOLUS
500.0000 mL | Freq: Once | INTRAVENOUS | Status: AC
  Administered 2022-12-09: 500 mL via INTRAVENOUS

## 2022-12-09 MED ORDER — HYDROCOD POLI-CHLORPHE POLI ER 10-8 MG/5ML PO SUER
5.0000 mL | Freq: Two times a day (BID) | ORAL | Status: DC
  Administered 2022-12-09 – 2022-12-11 (×4): 5 mL via ORAL
  Filled 2022-12-09 (×4): qty 5

## 2022-12-09 MED ORDER — INSULIN ASPART 100 UNIT/ML IJ SOLN
0.0000 [IU] | Freq: Three times a day (TID) | INTRAMUSCULAR | Status: DC
  Administered 2022-12-09: 5 [IU] via SUBCUTANEOUS
  Administered 2022-12-10: 3 [IU] via SUBCUTANEOUS
  Administered 2022-12-10: 1 [IU] via SUBCUTANEOUS
  Filled 2022-12-09: qty 0.09

## 2022-12-09 MED ORDER — METOPROLOL SUCCINATE ER 25 MG PO TB24
25.0000 mg | ORAL_TABLET | Freq: Every day | ORAL | Status: DC
  Administered 2022-12-09 – 2022-12-11 (×3): 25 mg via ORAL
  Filled 2022-12-09 (×3): qty 1

## 2022-12-09 MED ORDER — SODIUM CHLORIDE 0.9 % IV SOLN: INTRAVENOUS | Status: DC

## 2022-12-09 MED ORDER — UMECLIDINIUM-VILANTEROL 62.5-25 MCG/ACT IN AEPB: 1.0000 | INHALATION_SPRAY | Freq: Every day | RESPIRATORY_TRACT | Status: DC

## 2022-12-09 MED ORDER — ZOLPIDEM TARTRATE 5 MG PO TABS
5.0000 mg | ORAL_TABLET | Freq: Every day | ORAL | Status: DC
  Administered 2022-12-09 – 2022-12-10 (×2): 5 mg via ORAL
  Filled 2022-12-09 (×2): qty 1

## 2022-12-09 MED ORDER — INSULIN ASPART 100 UNIT/ML IJ SOLN
3.0000 [IU] | Freq: Three times a day (TID) | INTRAMUSCULAR | Status: DC
  Administered 2022-12-10: 3 [IU] via SUBCUTANEOUS

## 2022-12-09 MED ORDER — UMECLIDINIUM-VILANTEROL 62.5-25 MCG/ACT IN AEPB
1.0000 | INHALATION_SPRAY | Freq: Every day | RESPIRATORY_TRACT | Status: DC
  Administered 2022-12-10 – 2022-12-11 (×2): 1 via RESPIRATORY_TRACT
  Filled 2022-12-09: qty 14

## 2022-12-09 MED ORDER — ALBUTEROL SULFATE (2.5 MG/3ML) 0.083% IN NEBU
2.5000 mg | INHALATION_SOLUTION | RESPIRATORY_TRACT | Status: DC | PRN
  Administered 2022-12-09 – 2022-12-10 (×2): 2.5 mg via RESPIRATORY_TRACT
  Filled 2022-12-09 (×2): qty 3

## 2022-12-09 MED ORDER — FLUOXETINE HCL 20 MG PO CAPS
20.0000 mg | ORAL_CAPSULE | Freq: Every day | ORAL | Status: DC
  Administered 2022-12-09 – 2022-12-11 (×3): 20 mg via ORAL
  Filled 2022-12-09 (×3): qty 1

## 2022-12-09 NOTE — ED Notes (Signed)
Pt pending cardiology consult and bed admission to [facility] with admitting MD Doutova. Per MD, pt does not need ED to ED transfer for consult at this time. Pt to await bed assignment at [facility] for cardiology consult

## 2022-12-09 NOTE — Consult Note (Signed)
NAME:  Stacy [name], MRN:  [id number], DOB:  Dec 02, 1955, LOS: 1 ADMISSION DATE:  12/08/2022, CONSULTATION DATE:  2/16 REFERRING MD:  Daylene Posey, CHIEF COMPLAINT:  dyspnea   History of Present Illness:  67 year old female with COPD and chronic respiratory failure with hypoxemia followed by Dr. Lamonte Sakai for the same presented to the emergency department 15 2024 complaining of shortness of breath.  SHe says that she hasn't been feeling well since her December 29 sinus surgery.  However in the last 4 days she's had increasing cough, dyspnea, wheezing.  PCCM consulted in the context of a COPD exacerbation. Noted to have elevated lactic acid and troponin.  In the ER she's received IV solumedrol, ceftriaxone, azithromycin, bronchodilators, fluids.  She is feeling better.  Notes some leg swelling.  CT angiogram showed possible pneumonia in lingula, emphysema, no PE.  Resp viral panel positive for metapneumovirus  She was put on oxygen for the first time in 2023 when she was noticing a high heart rate with exercise that started abruptly.  She's been seen by [facility] and [facility].  She says in the last 4 day's she's needed oxygen more due to worsening dyspnea.    Pertinent  Medical History  COPD 02/2022 PFT ratio 50% FEV1 1.13 47% pred Chronic respiratory failure with hypoxemia 2L O2 with exertion CAD 2022 CT angiogram coronaries> calcium score 86, mild non-obstructive CAD  Significant Hospital Events: Including procedures, antibiotic start and stop dates in addition to other pertinent events    2/15 CT angiogram chest > CT angiogram chest images independently reviewed, no pulmonary embolism, moderate to severe upper lobe predominant centrilobular emphysema, small focal area of groundglass opacification in lingula described by radiology as possible pneumonia;  2/16 admission , elevated troponin. Resp viral panel positive for metapneumovirus    Interim History / Subjective:  As  above  Objective   Blood pressure 135/78, pulse 63, temperature 98.1 F (36.7 C), temperature source Oral, resp. rate 13, SpO2 94 %.       No intake or output data in the 24 hours ending 12/09/22 0739 There were no vitals filed for this visit.  Examination: General:  Resting comfortably in bed HENT: NCAT OP clear PULM: Poor air movement but no wheezing B, normal effort, speaking in full sentences CV: RRR, no mgr GI: BS+, soft, nontender MSK: normal bulk and tone Derm: trace pretibial edema Neuro: awake, alert, no distress, MAEW   Resolved Hospital Problem list     Assessment & Plan:  COPD with acute exacerbation CAP due to metapneumovirus Lactic acidosis > unclear etiology.  Never really septic or in shock, hypoxemic? Demand or cardiogenic? Demand ischemia with known mild non-obstructive CAD Acute on chronic respiratory failure with hypoxemia  Discussion: Feeling better with typical treatment for COPD exacerbation.  Unclear to me why her troponin and lactic acid are somewhat disproportionately elevated compared to hypoxemia and other vital signs.  Never received continuous albuterol neb.  Agree with cardiac workup. Have seen multiple patients in the past with acute heart failure when they develop metapneumovirus pneumonia.  Plan: Solumedrol > prednisone as you are doing Stop duoneb Start Anoro (has tried and failed outpatient ICS in past) Albuterol q3h prn dyspea Tele Echo Cardiology evaluation Could stop ceftriaxone/azithromycin if stable on 2/17  PCCM will be available prn over weekend, call if neede   Best Practice (right click and "Reselect all SmartList Selections" daily)   Per primary  Labs   CBC: Recent Labs  Lab  12/06/22 1614 12/08/22 2122 12/09/22 0026  WBC 10.0 17.2* 17.5*  NEUTROABS 8.8*  --   --   HGB 14.3 15.3* 15.2*  HCT 43.7 46.0 46.6*  MCV 97.1 96.8 97.7  PLT 285 296 [id number]    Basic Metabolic Panel: Recent Labs  Lab 12/06/22 1614  12/08/22 2122 12/08/22 2332  NA 137 137  --   K 3.9 3.8  --   CL 103 99  --   CO2 25 24  --   GLUCOSE 251* 238*  --   BUN 8 20  --   CREATININE 0.85 1.01*  --   CALCIUM 8.8* 9.2  --   MG  --   --  3.7*  PHOS  --   --  2.8   GFR: Estimated Creatinine Clearance: 52.7 mL/min (A) (by C-G formula based on SCr of 1.01 mg/dL (H)). Recent Labs  Lab 12/06/22 1614 12/08/22 2122 12/08/22 2232 12/09/22 0026 12/09/22 0255 12/09/22 0305 12/09/22 0531  PROCALCITON  --   --   --   --  <0.10  --   --   WBC 10.0 17.2*  --  17.5*  --   --   --   LATICACIDVEN  --   --  2.1* 3.7*  --  4.2* 2.6*    Liver Function Tests: Recent Labs  Lab 12/06/22 1614 12/08/22 2122  AST 41 53*  ALT 43 50*  ALKPHOS 85 79  BILITOT 0.2* 0.5  PROT 7.1 7.3  ALBUMIN 3.9 4.1   No results for input(s): "LIPASE", "AMYLASE" in the last 168 hours. No results for input(s): "AMMONIA" in the last 168 hours.  ABG    Component Value Date/Time   HCO3 29.9 (H) 12/09/2022 0013   O2SAT 72.8 12/09/2022 0013     Coagulation Profile: No results for input(s): "INR", "PROTIME" in the last 168 hours.  Cardiac Enzymes: Recent Labs  Lab 12/08/22 2332  CKTOTAL 144    HbA1C: HB A1C (BAYER DCA - WAIVED)  Date/Time Value Ref Range Status  04/18/2022 09:22 AM 6.5 (H) 4.8 - 5.6 % Final    Comment:             Prediabetes: 5.7 - 6.4          Diabetes: >6.4          Glycemic control for adults with diabetes: <7.0   08/06/2021 10:50 AM 5.6 4.8 - 5.6 % Final    Comment:             Prediabetes: 5.7 - 6.4          Diabetes: >6.4          Glycemic control for adults with diabetes: <7.0               **Please note reference interval change**    Hgb A1c MFr Bld  Date/Time Value Ref Range Status  12/09/2022 02:55 AM 6.8 (H) 4.8 - 5.6 % Final    Comment:    (NOTE) Pre diabetes:          5.7%-6.4%  Diabetes:              >6.4%  Glycemic control for   <7.0% adults with diabetes   12/10/2021 11:57 AM 6.6 (H) 4.8  - 5.6 % Final    Comment:             Prediabetes: 5.7 - 6.4          Diabetes: >6.4  Glycemic control for adults with diabetes: <7.0     CBG: Recent Labs  Lab 12/08/22 2358 12/09/22 0348  GLUCAP 205* 256*    Review of Systems:   Gen: Denies fever, chills, weight change, fatigue, night sweats HEENT: Denies blurred vision, double vision, hearing loss, tinnitus, sinus congestion, rhinorrhea, sore throat, neck stiffness, dysphagia PULM: per HPI CV: Denies chest pain, + edema, orthopnea, paroxysmal nocturnal dyspnea, palpitations GI: Denies abdominal pain, nausea, vomiting, diarrhea, hematochezia, melena, constipation, change in bowel habits GU: Denies dysuria, hematuria, polyuria, oliguria, urethral discharge Endocrine: Denies hot or cold intolerance, polyuria, polyphagia or appetite change Derm: Denies rash, dry skin, scaling or peeling skin change Heme: Denies easy bruising, bleeding, bleeding gums Neuro: Denies headache, numbness, weakness, slurred speech, loss of memory or consciousness   Past Medical History:  She,  has a past medical history of Allergic rhinitis (02/25/2008), Angina pectoris ([place]) (06/09/2021), Aortic atherosclerosis (HCC) (08/14/2017), Asthma, Asthmatic bronchitis (12/07/2021), Atrophy of vagina (12/07/2021), Centrilobular emphysema ([place]) (05/28/2018), Controlled diabetes mellitus type 2 with complications ([place]) (123XX123), COPD Gold C (11/02/2007), Coronary artery disease, Dysphagia (12/07/2021), Dyspnea on exertion (08/21/2018), Essential hypertension (12/07/2021), Facet arthropathy, lumbar (12/15/2021), Family history of osteoporosis (04/22/2019), Family history of rheumatoid arthritis (06/05/2019), Fatty liver disease, nonalcoholic, Ganglion of hand (12/07/2021), GERD (11/02/2007), Hemorrhoids (12/07/2021), Hyperlipidemia associated with type 2 diabetes mellitus ([place]) (03/24/2020), Hypersomnia with sleep apnea (05/28/2018), HYPERTENSION, BENIGN  (12/24/2008), Hypothyroidism (06/13/2007), IBS (irritable bowel syndrome), Lateral epicondylitis of left elbow (06/05/2019), Lung disease, bullous ([place]) (08/14/2017), Menopause (12/07/2021), Obesity (BMI 30.0-34.9) (05/28/2018), Osteopenia of multiple sites (12/15/2021), Overactive bladder (06/09/2017), Pain in female genitalia on intercourse (12/07/2021), Polyp of colon (12/07/2021), Post-menopausal (04/22/2019), Precordial pain, Psychophysiological insomnia (05/28/2018), Reactive depression (06/26/2018), Rosacea (08/01/2007), Snoring (05/28/2018), Stage 2 moderate COPD by GOLD classification ([place]) (11/02/2007), Statin intolerance (04/10/2019), Stress incontinence in female (06/09/2017), UTI'S, RECURRENT (06/13/2007), and Vitamin D insufficiency (12/15/2021).   Surgical History:   Past Surgical History:  Procedure Laterality Date   ABLATION     CARDIAC CATHETERIZATION N/A 06/12/2015   Procedure: Left Heart Cath and Coronary Angiography;  Surgeon: Burnell Blanks, MD;  Location: [facility];  Service: Cardiovascular;  Laterality: N/A;   CESAREAN SECTION     LTCS     x2   NASAL SINUS SURGERY     SPIROMETRY  01/05/2007   FVC 68% predicted; FEV1 44% predicted; FEV1/FVC 63% predicted = moderate obstruction with low vital capacity possibly due to restriction   SQUAMOUS CELL CARCINOMA EXCISION     TUBAL LIGATION       Social History:   reports that she quit smoking about 15 years ago. Her smoking use included cigarettes. She has a 15.00 pack-year smoking history. She has never used smokeless tobacco. She reports current alcohol use of about 1.0 standard drink of alcohol per week. She reports that she does not use drugs.   Family History:  Her family history includes CAD in her brother; Diabetes in her maternal grandmother; Heart disease in her father and mother; Heart disease (age of onset: 81) in her sister; Hyperlipidemia in her father; Hypertension in her father; Hypothyroidism in her  father and mother; Macular degeneration in her mother; Pulmonary fibrosis in her brother; Raynaud syndrome in her sister; Rheum arthritis in her brother and sister; Thyroid disease in her sister and sister. There is no history of Breast cancer, Ovarian cancer, Prostate cancer, Colon cancer, Stomach cancer, or Esophageal cancer.   Allergies Allergies  Allergen Reactions   Avelox [Moxifloxacin Hcl  In Nacl] Hives   Ciprofloxacin     REACTION: Rash   Livalo [Pitavastatin]    Metformin And Related     kidney   Moxifloxacin     REACTION: RASH   Bactrim [Sulfamethoxazole-Trimethoprim] Rash   Statins Other (See Comments)    myalgias   Sulfamethoxazole-Trimethoprim Rash    REACTION: Rash     Home Medications  Prior to Admission medications   Medication Sig Start Date End Date Taking? Authorizing Provider  albuterol (PROVENTIL) (2.5 MG/3ML) 0.083% nebulizer solution Take 3 mLs (2.5 mg total) by nebulization every 6 (six) hours as needed for wheezing or shortness of breath. 12/06/22  Yes Aberman, Druscilla Brownie, PA-C  albuterol (VENTOLIN HFA) 108 (90 Base) MCG/ACT inhaler Inhale 2 puffs into the lungs every 6 (six) hours as needed for wheezing or shortness of breath. 04/22/22  Yes Gottschalk, Ashly M, DO  benzonatate (TESSALON) 100 MG capsule Take 1 capsule (100 mg total) by mouth every 8 (eight) hours. 12/06/22  Yes Aberman, Druscilla Brownie, PA-C  Evolocumab (REPATHA) 140 MG/ML SOSY Inject 140 mg into the skin every 14 (fourteen) days. 07/11/22  Yes Revankar, Reita Cliche, MD  FLUoxetine (PROZAC) 20 MG capsule Take 20 mg by mouth daily.   Yes [provider]  furosemide (LASIX) 20 MG tablet Take 20 mg by mouth daily. 08/05/22  Yes [provider]  guaiFENesin (MUCINEX) 600 MG 12 hr tablet Take 1,200 mg by mouth 2 (two) times daily as needed for cough.   Yes [provider]  levofloxacin (LEVAQUIN) 750 MG tablet Take 750 mg by mouth daily. 11/29/22  Yes [provider]   levothyroxine (SYNTHROID) 75 MCG tablet Take 1 tablet (75 mcg total) by mouth daily. 04/18/22  Yes Gottschalk, Leatrice Jewels M, DO  loratadine (CLARITIN) 10 MG tablet Take 10 mg by mouth daily.   Yes [provider]  metoprolol succinate (TOPROL XL) 25 MG 24 hr tablet Take 1 tablet (25 mg total) by mouth daily. 08/03/22  Yes Revankar, Reita Cliche, MD  Omeprazole-Sodium Bicarbonate (ZEGERID) 20-1100 MG CAPS capsule Take 1 capsule by mouth at bedtime.   Yes [provider]  predniSONE (DELTASONE) 10 MG tablet Take 10-50 mg by mouth as directed. Dose Pack: Take 50 mg for 2 Days, Then Take 40 mg for 2 Days, Then Take 30 mg for 2 Days, Then Take 20 mg for 2 Days, Then Take 10 mg for 2 Days.   Yes [provider]  umeclidinium-vilanterol (ANORO ELLIPTA) 62.5-25 MCG/ACT AEPB Inhale 1 puff into the lungs daily. 12/29/21  Yes Collene Gobble, MD  Vitamin D, Ergocalciferol, (DRISDOL) 1.25 MG (50000 UNIT) CAPS capsule Take 1 capsule (50,000 Units total) by mouth every 7 (seven) days. 12/15/21  Yes [name], DO     Critical care time: n/a     Roselie Awkward, MD [facility] Pager: [phone] Cell: [phone] After 7:00 pm call Elink  [phone]

## 2022-12-09 NOTE — Discharge Instructions (Signed)
We will arrange outpt cardiac CTA for you.  The office should call you with date and time.  If you have not heard 2 days after you are discharged then call the office and ask.

## 2022-12-09 NOTE — Consult Note (Addendum)
Cardiology Consultation   Patient ID: Stacy [name] MRN: [id number]; DOB: July 08, 1956  Admit date: 12/08/2022 Date of Consult: 12/09/2022  PCP:  Stacy [name], Stacy [name] Providers Cardiologist:  Stacy Lindau, MD        Patient Profile:   Stacy [name] is a 67 y.o. female with a hx of CAD, aortic atherosclerosis, HTN, HLD, statin intolerance, DM-2, hx palpitations and tachycardia, murmur who is being seen 12/09/2022 for the evaluation of elevated troponin at the request of Stacy [name]. Has been seen with Stacy [name].    History of Present Illness:   Stacy [name] had mild CAD by cCTA in 2021, hypothyroidism, severe COPD and palpitations.  Was seen by Stacy [name] 07/2022 and then Stacy [name] with cardiology 09/23/22.   She believes she will return to Stacy [name], Stacy [name] was second opinion.  Stacy [name] died of  MI at 37, sister with MI in 73s, brother with several stents. She has episodic chest pain and with FH has been diligent in following.    Stacy [name] with heart disease as well.   Echo 07/2022 with EF 60-65%, no RWMA, RV normal, valves normal.  Prior cCTA 05/2021 with ca+ score of 86 (83rd percentile),  RCA patent, LAD and LC X with minimal dz  25%, most in LAD 25-49%.   Event monitor 08/2022 was normal  --remote cardiac cath 2016 with normal cors.   Pt presented to ER yesterday with SOB, chest pain, and + productive cough rust colored sputum.  Had been seen at Stacy [name] on 12/06/22 for SOB as well.  Given duo neb and improved.  Was on levoquin and prednisone from UC at that time as well.  She had been using Stacy home 02 for sp02 int he 79s.    Here she noted Stacy sp02 was in the 5s priro to arrival and on arrival she was having CP.   She felt chest pressure like she pulled a rib coughing.  COVID, FLU neg  RSV neg   She rec'd IV solumedrol, ceftriaxone, azithromycin, bronchodilators, fluids. Resp viral panel positive for metapneumovirus   Stacy chest pain yesterday  was mid sternal and a heavy pressure.  Was there most of the day. Today it comes and goes and overall she feels better.  Pain increased with lying back and better sitting up.    CXR with emphysema, CTA chest without PE, focal area of patchy infiltrate in left lower lung , aortic atherosclerosis, emphysematous changes in lungs, fatty infiltrate of liver.  No aortic dissection.  BNP 190, glucose 238, Mg+ 3.7 AST 53 and ALT 50  CK 144 Hs troponin 579, 413, 260, 250  Lactic acid 3.7 4.2, 2.6, 2.8  WBC 17.5 Hgb 15.2 plts 306  Na 137 K+ 3.8 Cr 1.01   EKG:  The EKG was personally reviewed and demonstrates:  SR and no ST changes all similar to EKGs 2/14 and 08/03/22.  Telemetry:  Telemetry was personally reviewed and demonstrates:  SR  BP 129/70 P 90 R 16-21 afebrile.   Past Medical History:  Diagnosis Date   Allergic rhinitis 02/25/2008   Qualifier: Diagnosis of  By: Valetta Close DO, Karen     Angina pectoris ([place]) 06/09/2021   Aortic atherosclerosis ([place]) 08/14/2017   Appreciated on CXR 08/14/2017   Asthma    Asthmatic bronchitis 12/07/2021   Atrophy of vagina 12/07/2021   Centrilobular emphysema ([place]) 05/28/2018   Controlled diabetes mellitus type 2 with  complications ([place]) [id number]   Has FLD.   COPD Gold C 11/02/2007   Formatting of this note might be different from the original. PFT 05/28/2019           ratio 50% FEV1 53% FVC 83%    DLCO 54% PFT 09/25/18 ([facility]) ratio 52% FEV1 56% FVC 108%; DLCO 69%   Coronary artery disease    Dysphagia 12/07/2021   Dyspnea on exertion 08/21/2018   Essential hypertension 12/07/2021   Facet arthropathy, lumbar 12/15/2021   Family history of osteoporosis 04/22/2019   Family history of rheumatoid arthritis 06/05/2019   Fatty liver disease, nonalcoholic    Ganglion of hand 12/07/2021   GERD 11/02/2007   Qualifier: Diagnosis of  By: Valetta Close DO, Karen     Hemorrhoids 12/07/2021   Hyperlipidemia associated with type 2 diabetes mellitus ([place]) 03/24/2020    Hypersomnia with sleep apnea 05/28/2018   HYPERTENSION, BENIGN 12/24/2008   Qualifier: Diagnosis of  By: Stanford Breed, MD, Kandyce Rud    Hypothyroidism 06/13/2007   ? Hashimotos.  Has strong family hx autoimmune d/o No h/o thyroidectomy or radioablation.    IBS (irritable bowel syndrome)    Lateral epicondylitis of left elbow 06/05/2019   Lung disease, bullous ([place]) 08/14/2017   Menopause 12/07/2021   Obesity (BMI 30.0-34.9) 05/28/2018   Osteopenia of multiple sites 12/15/2021   Overactive bladder 06/09/2017   Pain in female genitalia on intercourse 12/07/2021   Polyp of colon 12/07/2021   Post-menopausal 04/22/2019   Precordial pain    Sees Stacy Stanford Breed.  Last cardiac cath 2016 = negative. Strong family h/o MI and CAD   Psychophysiological insomnia 05/28/2018   Reactive depression 06/26/2018   Rosacea 08/01/2007   Qualifier: Diagnosis of  By: Valetta Close DO, Karen     Snoring 05/28/2018   Stage 2 moderate COPD by GOLD classification ([place]) 11/02/2007   Formatting of this note might be different from the original. PFT 05/28/2019           ratio 50% FEV1 53% FVC 83%    DLCO 54% PFT 09/25/18 ([facility]) ratio 52% FEV1 56% FVC 108%; DLCO 69%   Statin intolerance 04/10/2019   Stress incontinence in female 06/09/2017   UTI'S, RECURRENT 06/13/2007   Qualifier: Diagnosis of  By: Esmeralda Arthur     Vitamin D insufficiency 12/15/2021    Past Surgical History:  Procedure Laterality Date   ABLATION     CARDIAC CATHETERIZATION N/A 06/12/2015   Procedure: Left Heart Cath and Coronary Angiography;  Surgeon: Stacy Blanks, MD;  Location: [facility];  Service: Cardiovascular;  Laterality: N/A;   CESAREAN SECTION     LTCS     x2   NASAL SINUS SURGERY     SPIROMETRY  01/05/2007   FVC 68% predicted; FEV1 44% predicted; FEV1/FVC 63% predicted = moderate obstruction with low vital capacity possibly due to restriction   SQUAMOUS CELL CARCINOMA EXCISION     TUBAL LIGATION       Home  Medications:  Prior to Admission medications   Medication Sig Start Date End Date Taking? Authorizing Provider  albuterol (PROVENTIL) (2.5 MG/3ML) 0.083% nebulizer solution Take 3 mLs (2.5 mg total) by nebulization every 6 (six) hours as needed for wheezing or shortness of breath. 12/06/22  Yes Aberman, Druscilla Brownie, PA-C  albuterol (VENTOLIN HFA) 108 (90 Base) MCG/ACT inhaler Inhale 2 puffs into the lungs every 6 (six) hours as needed for wheezing or shortness of breath. 04/22/22  Yes Ronnie Doss  M, DO  benzonatate (TESSALON) 100 MG capsule Take 1 capsule (100 mg total) by mouth every 8 (eight) hours. 12/06/22  Yes Aberman, Druscilla Brownie, PA-C  Evolocumab (REPATHA) 140 MG/ML SOSY Inject 140 mg into the skin every 14 (fourteen) days. 07/11/22  Yes Revankar, Reita Cliche, MD  FLUoxetine (PROZAC) 20 MG capsule Take 20 mg by mouth daily.   Yes [provider]  furosemide (LASIX) 20 MG tablet Take 20 mg by mouth daily. 08/05/22  Yes [provider]  guaiFENesin (MUCINEX) 600 MG 12 hr tablet Take 1,200 mg by mouth 2 (two) times daily as needed for cough.   Yes [provider]  levofloxacin (LEVAQUIN) 750 MG tablet Take 750 mg by mouth daily. 11/29/22  Yes [provider]  levothyroxine (SYNTHROID) 75 MCG tablet Take 1 tablet (75 mcg total) by mouth daily. 04/18/22  Yes Gottschalk, Leatrice Jewels M, DO  loratadine (CLARITIN) 10 MG tablet Take 10 mg by mouth daily.   Yes [provider]  metoprolol succinate (TOPROL XL) 25 MG 24 hr tablet Take 1 tablet (25 mg total) by mouth daily. 08/03/22  Yes Revankar, Reita Cliche, MD  Omeprazole-Sodium Bicarbonate (ZEGERID) 20-1100 MG CAPS capsule Take 1 capsule by mouth at bedtime.   Yes [provider]  predniSONE (DELTASONE) 10 MG tablet Take 10-50 mg by mouth as directed. Dose Pack: Take 50 mg for 2 Days, Then Take 40 mg for 2 Days, Then Take 30 mg for 2 Days, Then Take 20 mg for 2 Days, Then Take 10 mg for 2 Days.   Yes [provider]  umeclidinium-vilanterol (ANORO ELLIPTA) 62.5-25 MCG/ACT AEPB Inhale 1 puff into the lungs daily. 12/29/21  Yes Collene Gobble, MD  Vitamin D, Ergocalciferol, (DRISDOL) 1.25 MG (50000 UNIT) CAPS capsule Take 1 capsule (50,000 Units total) by mouth every 7 (seven) days. 12/15/21  Yes Stacy Norlander, DO    Inpatient Medications: Scheduled Meds:  insulin aspart  0-9 Units Subcutaneous TID WC   levothyroxine  75 mcg Oral Daily   methylPREDNISolone (SOLU-MEDROL) injection  40 mg Intravenous Q12H   Followed by   Derrill Memo ON 12/10/2022] predniSONE  40 mg Oral Q breakfast   sodium chloride flush  3 mL Intravenous Q12H   umeclidinium-vilanterol  1 puff Inhalation Daily   Continuous Infusions:  sodium chloride     sodium chloride Stopped (12/09/22 0607)   azithromycin     cefTRIAXone (ROCEPHIN)  IV     PRN Meds: sodium chloride, acetaminophen **OR** acetaminophen, albuterol, HYDROcodone-acetaminophen, sodium chloride flush  Allergies:    Allergies  Allergen Reactions   Avelox [Moxifloxacin Hcl In Nacl] Hives   Ciprofloxacin     REACTION: Rash   Livalo [Pitavastatin]    Metformin And Related     kidney   Moxifloxacin     REACTION: RASH   Bactrim [Sulfamethoxazole-Trimethoprim] Rash   Statins Other (See Comments)    myalgias   Sulfamethoxazole-Trimethoprim Rash    REACTION: Rash    Social History:   Social History   Socioeconomic History   Marital status: Married    Spouse name: Not on file   Number of children: 2   Years of education: Not on file   Highest education level: Associate degree: academic program  Occupational History   Occupation: Works in Science writer   Occupation: Disabled  Tobacco Use   Smoking status: Former    Packs/day: 0.50    Years: 30.00    Total pack years: 15.00  Types: Cigarettes    Quit date: 10/25/2007    Years since quitting: 15.1   Smokeless tobacco: Never  Vaping Use   Vaping Use: Never used  Substance and Sexual Activity    Alcohol use: Yes    Alcohol/week: 1.0 standard drink of alcohol    Types: 1 Glasses of wine per week    Comment: Occasional-once a year   Drug use: No   Sexual activity: Yes    Birth control/protection: Surgical  Other Topics Concern   Not on file  Social History Narrative   Remarried.    Previous occupation: Customer service manager.       Moved from [facility] in [place]   2 children, has stepchildren and grandchildren   Does not exercise   Social Determinants of Health   Financial Resource Strain: Low Risk  (12/27/2021)   Overall Financial Resource Strain (CARDIA)    Difficulty of Paying Living Expenses: Not hard at all  Food Insecurity: No Food Insecurity (12/27/2021)   Hunger Vital Sign    Worried About Running Out of Food in the Last Year: Never true    Ran Out of Food in the Last Year: Never true  Transportation Needs: No Transportation Needs (12/27/2021)   PRAPARE - Hydrologist (Medical): No    Lack of Transportation (Non-Medical): No  Physical Activity: Insufficiently Active (12/27/2021)   Exercise Vital Sign    Days of Exercise per Week: 3 days    Minutes of Exercise per Session: 30 min  Stress: No Stress Concern Present (12/27/2021)   [place]    Feeling of Stress : Not at all  Social Connections: [place] (12/27/2021)   Social Connection and Isolation Panel [NHANES]    Frequency of Communication with Friends and Family: More than three times a week    Frequency of Social Gatherings with Friends and Family: More than three times a week    Attends Religious Services: More than 4 times per year    Active Member of [employer] or Organizations: Yes    Attends Music therapist: More than 4 times per year    Marital Status: Married  Human resources officer Violence: Not At Risk (12/27/2021)   Humiliation, Afraid, Rape, and Kick questionnaire    Fear of Current or Ex-Partner: No    Emotionally  Abused: No    Physically Abused: No    Sexually Abused: No    Family History:    Family History  Problem Relation Age of Onset   Hypertension Father    Hyperlipidemia Father    Heart disease Father        Died at age 60 of MI   Hypothyroidism Father    Diabetes Maternal Grandmother    Raynaud syndrome Sister    Rheum arthritis Sister    Thyroid disease Sister    CAD Brother        has 4 stents   Rheum arthritis Brother    Pulmonary fibrosis Brother    Heart disease Sister 32       h/o MI @age$  70   Thyroid disease Sister    Heart disease Stacy [name]    Hypothyroidism Stacy [name]    Macular degeneration Stacy [name]    Breast cancer Neg Hx    Ovarian cancer Neg Hx    Prostate cancer Neg Hx    Colon cancer Neg Hx    Stomach cancer Neg Hx    Esophageal cancer Neg  Hx      ROS:  Please see the history of present illness.  General:no colds or fevers, no weight changes Skin:no rashes or ulcers HEENT:no blurred vision, no congestion CV:see HPI PUL:see HPI + Asthma GI:no diarrhea constipation or melena, no indigestion GU:no hematuria, no dysuria MS:no joint pain, no claudication Neuro:no syncope, no lightheadedness Endo:+ diabetes, + thyroid disease  All other ROS reviewed and negative.     Physical Exam/Data:   Vitals:   12/09/22 0900 12/09/22 0924 12/09/22 1100 12/09/22 1315  BP: 133/78 133/78 129/70 (!) 150/82  Pulse: 91 74 70 74  Resp: 19 (!) 22 16 19  $ Temp:  97.7 F (36.5 C)  97.9 F (36.6 C)  TempSrc:  Oral  Oral  SpO2: 93% 93% 94% 94%   No intake or output data in the 24 hours ending 12/09/22 1339    12/08/2022   10:28 AM 12/06/2022    4:14 PM 09/20/2022   10:41 AM  Last 3 Weights  Weight (lbs) 149 lb 8 oz 146 lb 153 lb 6.4 oz  Weight (kg) 67.813 kg 66.225 kg 69.582 kg     There is no height or weight on file to calculate BMI.  General:  Well nourished, well developed, in no acute distress  HEENT: normal Neck: mild JVD when flat Vascular: No carotid bruits;  Distal pulses 2+ bilaterally Cardiac:  normal S1, S2; RRR; no murmur gallup rub or click Lungs:  clear to auscultation bilaterally, occ wheezing,+ rhonchi no rales  Abd: soft, nontender, no hepatomegaly  Ext: no edema Musculoskeletal:  No deformities, BUE and BLE strength normal and equal Skin: warm and dry  Neuro:  alert and oriented X 3 MAE follows commands, no focal abnormalities noted Psych:  Normal affect    Relevant CV Studies: Monitor  08/2022 Patch Wear Time:  13 days and 22 hours (2023-10-14T09:12:21-0400 to 2023-10-28T07:50:07-399)   Patient had a min HR of 52 bpm, max HR of 182 bpm, and avg HR of 88 bpm.    Predominant underlying rhythm was Sinus Rhythm. 4 Supraventricular Tachycardia runs occurred, the run with the fastest interval lasting 12.1 secs with a max rate of 182 bpm (avg 140 bpm); the run with the fastest interval was also the longest.    Isolated SVEs were rare (<1.0%), SVE Couplets were rare (<1.0%), and no SVE Triplets were present. Isolated VEs were rare (<1.0%), and no VE Couplets or VE Triplets were present.    Impression: Unremarkable event monitor.   Echo 08/12/22    1. GLS -15.8. Left ventricular ejection fraction, by estimation, is 60 to  65%. Left ventricular ejection fraction by 3D volume is 66 %. The left  ventricle has normal function. The left ventricle has no regional wall  motion abnormalities. Left  ventricular diastolic parameters were normal.   2. Right ventricular systolic function is normal. The right ventricular  size is normal. There is normal pulmonary artery systolic pressure.   3. The mitral valve is normal in structure. No evidence of mitral valve  regurgitation. No evidence of mitral stenosis.   4. The aortic valve is normal in structure. Aortic valve regurgitation is  not visualized. No aortic stenosis is present.   5. The inferior vena cava is normal in size with greater than 50%  respiratory variability, suggesting right  atrial pressure of 3 mmHg.   FINDINGS   Left Ventricle: GLS -15.8. Left ventricular ejection fraction, by  estimation, is 60 to 65%. Left ventricular ejection fraction by  3D volume  is 66 %. The left ventricle has normal function. The left ventricle has no  regional wall motion abnormalities.  The left ventricular internal cavity size was normal in size. There is no  left ventricular hypertrophy. Left ventricular diastolic parameters were  normal.   Right Ventricle: The right ventricular size is normal. No increase in  right ventricular wall thickness. Right ventricular systolic function is  normal. There is normal pulmonary artery systolic pressure. The tricuspid  regurgitant velocity is 1.36 m/s, and   with an assumed right atrial pressure of 3 mmHg, the estimated right  ventricular systolic pressure is [id number] mmHg.   Left Atrium: Left atrial size was normal in size.   Right Atrium: Right atrial size was normal in size.   Pericardium: There is no evidence of pericardial effusion.   Mitral Valve: The mitral valve is normal in structure. No evidence of  mitral valve regurgitation. No evidence of mitral valve stenosis.   Tricuspid Valve: The tricuspid valve is normal in structure. Tricuspid  valve regurgitation is not demonstrated. No evidence of tricuspid  stenosis.   Aortic Valve: The aortic valve is normal in structure. Aortic valve  regurgitation is not visualized. No aortic stenosis is present.   Pulmonic Valve: The pulmonic valve was normal in structure. Pulmonic valve  regurgitation is not visualized. No evidence of pulmonic stenosis.   Aorta: The aortic root is normal in size and structure.   Venous: The inferior vena cava is normal in size with greater than 50%  respiratory variability, suggesting right atrial pressure of 3 mmHg.   IAS/Shunts: No atrial level shunt detected by color flow Doppler.      CCTA  05/2021 FINDINGS: Coronary calcium score: The patient's  coronary artery calcium score is 86, which places the patient in the 83rd percentile.   Coronary arteries: Normal coronary origins.  Right dominance.   Right Coronary Artery: Small caliber vessel, nondominant. Trivial scattered noncalcified plaque without significant stenosis.   Left Main Coronary Artery: Normal caliber vessel. Minimal noncalcified plaque with no significant stenosis.   Left Anterior Descending Coronary Artery: Normal caliber vessel. Mixed calcified and noncalcified plaque scattered throughout the vessel, predominantly noncalcified. Mixed calcified and noncalcified plaque in proximal LAD with 25-49% stenosis. Mixed calcified and noncalcified plaque in mid LAD with 1-24% stenosis. Gives rise to 2 diagonal branches. Distal LAD wraps apex.   Left Circumflex Artery: Normal caliber vessel, gives rise to PDA. Scattered noncalcified plaque throughout the vessel. Mixed calcified and noncalcified plaque proximal to origin of OM1 with 1-24% stenosis. Gives rise to 2 OM branches.   Aorta: Normal size, 32 mm at the mid ascending aorta (level of the PA bifurcation) measured double oblique. Aortic atherosclerosis. No dissection seen in visualized portions of the aorta.   Aortic Valve: No calcifications. Trileaflet.   Other findings:   Normal pulmonary vein drainage into the left atrium.   Normal left atrial appendage without a thrombus.   Normal size of the pulmonary artery.   Normal appearance of the pericardium.   IMPRESSION: 1. Mild nonobstructive CAD, CADRADS = 2.   2. Coronary calcium score of 86. This was 83rd percentile for age and sex matched control.   3. Normal coronary origin with left dominance.   4. Aortic atherosclerosis.   INTERPRETATION:   1. CAD-RADS 0: No evidence of CAD (0%). Consider non-atherosclerotic causes of chest pain.   2. CAD-RADS 1: Minimal non-obstructive CAD (0-24%). Consider non-atherosclerotic causes of chest pain. Consider  preventive  therapy and risk factor modification.   3. CAD-RADS 2: Mild non-obstructive CAD (25-49%). Consider non-atherosclerotic causes of chest pain. Consider preventive therapy and risk factor modification.   4. CAD-RADS 3: Moderate stenosis (50-69%). Consider symptom-guided anti-ischemic pharmacotherapy as well as risk factor modification per guideline directed care. Additional analysis with CT FFR will be submitted.   5. CAD-RADS 4: Severe stenosis. (70-99% or > 50% left main). Cardiac catheterization or CT FFR is recommended. Consider symptom-guided anti-ischemic pharmacotherapy as well as risk factor modification per guideline directed care. Invasive coronary angiography recommended with revascularization per published guideline statements.   6. CAD-RADS 5: Total coronary occlusion (100%). Consider cardiac catheterization or viability assessment. Consider symptom-guided anti-ischemic pharmacotherapy as well as risk factor modification per guideline directed care.   7. CAD-RADS N: Non-diagnostic study. Obstructive CAD can't be excluded. Alternative evaluation is recommended.  CARDIAC Cath  06/12/15 The left ventricular systolic function is normal.   1. No angiographic evidence of CAD 2. Normal LV systolic function   Recommendations: No further ischemic evaluation.   Laboratory Data:  High Sensitivity Troponin:   Recent Labs  Lab 12/06/22 2118 12/08/22 2122 12/08/22 2332 12/09/22 0255 12/09/22 0531  TROPONINIHS 4 579* 413* 260* 250*     Chemistry Recent Labs  Lab 12/06/22 1614 12/08/22 2122 12/08/22 2332  NA 137 137  --   K 3.9 3.8  --   CL 103 99  --   CO2 25 24  --   GLUCOSE 251* 238*  --   BUN 8 20  --   CREATININE 0.85 1.01*  --   CALCIUM 8.8* 9.2  --   MG  --   --  3.7*  GFRNONAA >60 >60  --   ANIONGAP 9 14  --     Recent Labs  Lab 12/06/22 1614 12/08/22 2122  PROT 7.1 7.3  ALBUMIN 3.9 4.1  AST 41 53*  ALT 43 50*  ALKPHOS 85 79   BILITOT 0.2* 0.5   Lipids No results for input(s): "CHOL", "TRIG", "HDL", "LABVLDL", "LDLCALC", "CHOLHDL" in the last 168 hours.  Hematology Recent Labs  Lab 12/06/22 1614 12/08/22 2122 12/09/22 0026  WBC 10.0 17.2* 17.5*  RBC 4.50 4.75 4.77  HGB 14.3 15.3* 15.2*  HCT 43.7 46.0 46.6*  MCV 97.1 96.8 97.7  MCH 31.8 32.2 31.9  MCHC 32.7 33.3 32.6  RDW 12.6 12.6 12.6  PLT 285 296 306   Thyroid  Recent Labs  Lab 12/09/22 0026  TSH 1.173    BNP Recent Labs  Lab 12/08/22 2122  BNP 190.0*    DDimer No results for input(s): "DDIMER" in the last 168 hours.   Radiology/Studies:  CT Angio Chest PE W and/or Wo Contrast  Result Date: 12/08/2022 CLINICAL DATA:  Pulmonary embolus suspected with high probability. Worsening shortness of breath. EXAM: CT ANGIOGRAPHY CHEST WITH CONTRAST TECHNIQUE: Multidetector CT imaging of the chest was performed using the standard protocol during bolus administration of intravenous contrast. Multiplanar CT image reconstructions and MIPs were obtained to evaluate the vascular anatomy. RADIATION DOSE REDUCTION: This exam was performed according to the departmental dose-optimization program which includes automated exposure control, adjustment of the mA and/or kV according to patient size and/or use of iterative reconstruction technique. CONTRAST:  119m OMNIPAQUE IOHEXOL 350 MG/ML SOLN COMPARISON:  Chest radiograph 12/08/2022. Cardiac CT 06/18/2021. CT angio chest 09/19/2020 FINDINGS: Cardiovascular: There is good opacification of the central and segmental pulmonary arteries representing technically adequate study. No focal filling defects are identified. No evidence  of significant pulmonary embolus. Normal heart size. No pericardial effusions. Normal caliber thoracic aorta. No evidence of aortic dissection. Calcification of the aorta and coronary arteries. Mediastinum/Nodes: Esophagus is decompressed. No significant lymphadenopathy. Thyroid gland is  unremarkable. Lungs/Pleura: Emphysematous changes throughout the lungs with scarring in the lung apices. Focal area of patchy infiltration demonstrated in the left lingula and lower lung which could indicate early pneumonia. Right lung is clear. No pleural effusions. No pneumothorax. Upper Abdomen: Diffuse fatty infiltration of the liver. No acute process demonstrated in the upper abdomen. Musculoskeletal: Degenerative changes in the spine. Review of the MIP images confirms the above findings. IMPRESSION: 1. No evidence of significant pulmonary embolus. 2. Focal area of patchy infiltration demonstrated in the left lower lung and lingula likely representing an area of pneumonia. 3. Aortic atherosclerosis. 4. Emphysematous changes in the lungs. 5. Fatty infiltration of the liver. Electronically Signed   By: Lucienne Capers M.D.   On: 12/08/2022 22:32   DG Chest 2 View  Result Date: 12/08/2022 CLINICAL DATA:  Dyspnea EXAM: CHEST - 2 VIEW COMPARISON:  12/06/2022 FINDINGS: The lungs are mildly hyperinflated paucity of vasculature within the lung apices in keeping with changes of emphysema. No superimposed confluent pulmonary infiltrate. No pneumothorax or pleural effusion. Cardiac size within normal limits. Pulmonary vascularity is normal. No acute bone abnormality. IMPRESSION: 1. No active cardiopulmonary disease. 2. Emphysema. Electronically Signed   By: Fidela Salisbury M.D.   On: 12/08/2022 20:53   DG Chest 2 View  Result Date: 12/06/2022 CLINICAL DATA:  Shortness of breath. EXAM: CHEST - 2 VIEW COMPARISON:  Chest x-ray October 16 23. FINDINGS: Mild streaky bibasilar opacities. Hyperinflation. No confluent consolidation. No visible pleural effusions or pneumothorax. Cardiomediastinal silhouette is within normal limits. Polyarticular degenerative change. Osteopenia. IMPRESSION: 1. Mild streaky bibasilar opacities, probably atelectasis. 2. Hyperinflation, suggestive of COPD/emphysema. Electronically Signed   By:  Margaretha Sheffield M.D.   On: 12/06/2022 16:56     Assessment and Plan:   Chest pain with elevated troponin  with hx of minimal CAD most likely demand ischemia with #2.  With DM, HLD, and strong FH could consider further testing once improved. --she has an echo ordered. If RWMA then would reconsider.  EKG is normal.  Would not transfer to [facility] at this time.  COPD with acute exacerbation/CAP due to metapneumovirus/Acute on chronic respiratory failure with hypoxemia per CCM.   DM/hypthyroid disease per IM  4.   HLD on Repatha every 14 days. In June 2023 HDL 41 and LDL 68  Risk Assessment/Risk Scores:     TIMI Risk Score for Unstable Angina or Non-ST Elevation MI:   The patient's TIMI risk score is 4, which indicates a 20% risk of all cause mortality, new or recurrent myocardial infarction or need for urgent revascularization in the next 14 days.    For questions or updates, please contact [place] Please consult [email or web address] for contact info under   Signed, Cecilie Kicks, NP  12/09/2022 1:39 PM As above, patient seen and examined.  Briefly she is a 67 year old female with past medical history of mild coronary disease, hypertension, hyperlipidemia, severe COPD, diabetes mellitus for evaluation of chest pain and elevated troponin.  Cardiac CTA August 2022 showed calcium score 86 and mild disease in the LAD.  Most recent echocardiogram October 2023 showed normal LV function.  Patient states she had surgery on Stacy sinuses in December.  Since that time she has had some dyspnea.  Over the past 7 to  10 days Stacy symptoms have worsened with significant dyspnea on exertion.  She has orthopnea but no PND or pedal edema.  She has had a cough that is now productive of rust colored sputum.  She was admitted with pneumonia.  She also complained of chest tightness.  It is substernal without radiation.  Both exertional and at rest.  Increases with cough and inspiration at times.  Troponin mildly  elevated and cardiology asked to evaluate.  Chest CT shows no pulmonary embolus and left lower lobe pneumonia.  Troponin is 4, 579, 413, 260 and 250.  P190.  Respiratory panel positive for metapneumovirus.  Electrocardiogram shows normal sinus rhythm with no ST changes. 1 Chest tightness-symptoms are atypical.  Possibly respiratory mediated.  Increased with cough and inspiration.  Electrocardiogram shows no ST changes.  Troponin minimally elevated and likely secondary to ongoing pneumonia.  Would allow patient to recover from pneumonia and we will plan outpatient cardiac CTA to rule out obstructive coronary disease.  Note patient has strong family history.  2 pneumonia/COPD-antibiotics per primary care.  3 hyperlipidemia-resume Repatha at discharge.  Cardiology will sign off.  We will arrange cardiac CTA as an outpatient.  Kirk Ruths, MD

## 2022-12-09 NOTE — ED Notes (Signed)
Assisted pt to the restroom pt ambulatory

## 2022-12-09 NOTE — ED Notes (Signed)
Continued uptrend in lactic acid from 3.7 to 4.2 communicated to floor coverage NP Janan Ridge and MD Doutova. Pt IVF100 ml/hr continue. Pending new orders.

## 2022-12-09 NOTE — Inpatient Diabetes Management (Signed)
Inpatient Diabetes Program Recommendations  AACE/ADA: New Consensus Statement on Inpatient Glycemic Control (2015)  Target Ranges:  Prepandial:   less than 140 mg/dL      Peak postprandial:   less than 180 mg/dL (1-2 hours)      Critically ill patients:  140 - 180 mg/dL   Lab Results  Component Value Date   GLUCAP 264 (H) 12/09/2022   HGBA1C 6.8 (H) 12/09/2022    Review of Glycemic Control  Latest Reference Range & Units 12/08/22 23:58 12/09/22 03:48 12/09/22 07:58 12/09/22 10:46  Glucose-Capillary 70 - 99 mg/dL 205 (H) 256 (H) 178 (H) 264 (H)  (H): Data is abnormally high  Diabetes history: DM Outpatient Diabetes medications: None Current orders for Inpatient glycemic control: Novolog 0-9 units TID, Solumedrol 40 mg Q12H, Prednisone to start tomorrow  Inpatient Diabetes Program Recommendations:    Semglee 10 units QD (0.15 units x 67.8 kg)  Will continue to follow while inpatient.  Thank you, Reche Dixon, MSN, [place] Diabetes Coordinator Inpatient Diabetes Program [phone] (team pager from 8a-5p)

## 2022-12-09 NOTE — H&P (Incomplete)
Stacy [name] [id number] DOB: [birth year] DOA: 12/08/2022   PCP: [name], DO   Outpatient Specialists:   CARDS:  Brunetta Genera, MD  [facility]   Pulmonary  Dr. Dr. Lamonte Sakai    Patient arrived to ER on 12/08/22 at 2011 Referred by Attending Tegeler, Gwenyth Allegra, *   Patient coming from:    home Lives With family    Chief Complaint:   Chief Complaint  Patient presents with  . Shortness of Breath         HPI: Stacy [name] is a 67 y.o. female with medical history significant of COPD on chronic oxygen, HTN, HLD    Presented with worsening shortness of breath Pt with known hx of COPD presented to PCP w 2wk hx of COUgh wheezing, SOB, yellow sputum She has oxygen at home but only uses as needed but has to be using it continuously 2 to 3 L to keep O2 in the 90s.  She was seen at urgent care and was started on Levaquin and prednisone then had to go to ER on the 13th for COPD exacerbation at that time chest x-ray showed mild bibasilar opacities felt to be atelectasis respiratory panel was negative she was given IV steroids and DuoNeb treatment and was able to go home but progressed since then.  She has pulmonologist but did not consult them Patient became so short of breath she had hard time speaking.  Also endorses history of rusty colored sputum she laid on attempted to call pulmonology but was told that she needs to go to emergency department  Hypoxic down to 70 at home  On arrival to ER was having CP    Initial COVID TEST  NEGATIVE*   Lab Results  Component Value Date   [place] 12/08/2022   [place] NEGATIVE 12/06/2022   [place] Not Detected 06/14/2021   [place] NEGATIVE 09/19/2020     Regarding pertinent Chronic problems:     Hyperlipidemia - on statins [place] Lipid Panel     Component Value Date/Time   CHOL 130 08/03/2022 1342   TRIG 184 (H) 08/03/2022 1342   HDL 47 08/03/2022 1342   CHOLHDL 2.8 08/03/2022 1342   CHOLHDL  3.3 Ratio 08/05/2009 2054   VLDL 21 08/05/2009 2054   LDLCALC 53 08/03/2022 1342   LABVLDL 30 08/03/2022 1342     HTN on LAsix History of diastolic CHF on Lasix   - last echo 2023 EF 66      DM 2 -  Lab Results  Component Value Date   HGBA1C 6.5 (H) 04/18/2022   diet controlled   Hypothyroidism:  Lab Results  Component Value Date   TSH 2.700 08/03/2022   on synthroid      COPD -  followed by pulmonology    on baseline oxygen  2L,  PRN  *** OSA -on nocturnal oxygen, *CPAP, *noncompliant with CPAP       While in ER:   Found to have evidence of COPD exacerbation white blood cell count elevated 17.2 but that is in the setting of recent steroid use.  Troponin noted to be elevated at 579 She was given a dose of IV mag, solumedrol and Duoneb      CXR -  NON acute     CTA chest -  no PE,  no evidence of infiltrate  Focal area of patchy infiltration demonstrated in the left lower lung and lingula likely representing an area of pneumonia. Emphysematous changes in  the lungs.   Fatty infiltration of the liver.  Following Medications were ordered in ER: Medications  cefTRIAXone (ROCEPHIN) 1 g in sodium chloride 0.9 % 100 mL IVPB (has no administration in time range)  azithromycin (ZITHROMAX) 500 mg in sodium chloride 0.9 % 250 mL IVPB (has no administration in time range)  ipratropium-albuterol (DUONEB) 0.5-2.5 (3) MG/3ML nebulizer solution 3 mL (3 mLs Nebulization Given 12/08/22 2148)  methylPREDNISolone sodium succinate (SOLU-MEDROL) 125 mg/2 mL injection 125 mg (125 mg Intravenous Given 12/08/22 2147)  magnesium sulfate IVPB 2 g 50 mL (0 g Intravenous Stopped 12/08/22 2315)  iohexol (OMNIPAQUE) 350 MG/ML injection 100 mL (100 mLs Intravenous Contrast Given 12/08/22 2217)    _______________________________________________________ ER Provider Called:   Cardiolgy  Dr. Grayce Sessions They Recommend admit to medicine  y myocardial injury in the setting of this pneumonia. He recommends  admission to [facility] and consult cardiology when she gets there. The hemoptysis he will hold on heparinization at this time and agrees with medicine admission.  Will see on arrival to [facility]    ED Triage Vitals  Enc Vitals Group     BP 12/08/22 2029 138/88     Pulse Rate 12/08/22 2029 80     Resp 12/08/22 2029 16     Temp 12/08/22 2029 98.1 F (36.7 C)     Temp Source 12/08/22 2029 Oral     SpO2 12/08/22 2029 99 %     Weight --      Height --      Head Circumference --      Peak Flow --      Pain Score 12/08/22 2027 6     Pain Loc --      Pain Edu? --      Excl. in [place]? --   TMAX(24)@     _________________________________________ Significant initial  Findings: Abnormal Labs Reviewed  CBC - Abnormal; Notable for the following components:      Result Value   WBC 17.2 (*)    Hemoglobin 15.3 (*)    All other components within normal limits  COMPREHENSIVE METABOLIC PANEL - Abnormal; Notable for the following components:   Glucose, Bld 238 (*)    Creatinine, Ser 1.01 (*)    AST 53 (*)    ALT 50 (*)    All other components within normal limits  BRAIN NATRIURETIC PEPTIDE - Abnormal; Notable for the following components:   B Natriuretic Peptide 190.0 (*)    All other components within normal limits  TROPONIN I (HIGH SENSITIVITY) - Abnormal; Notable for the following components:   Troponin I (High Sensitivity) 579 (*)    All other components within normal limits   _____________ Troponin 579   ECG: Ordered Personally reviewed and interpreted by me showing: HR : 72 Rhythm: NSR,   no evidence of ischemic changes QTC 455    The recent clinical data is shown below. Vitals:   12/08/22 2145 12/08/22 2200 12/08/22 2245 12/08/22 2300  BP: 131/89 132/85 (!) 148/82   Pulse: 73 80 77 71  Resp: 14 17 13 12  $ Temp:      TempSrc:      SpO2: 93% 94% 95% 95%      WBC     Component Value Date/Time   WBC 17.2 (H) 12/08/2022 2122   LYMPHSABS 0.8 12/06/2022 1614   LYMPHSABS 2.3  01/18/2022 1634   MONOABS 0.3 12/06/2022 1614   EOSABS 0.0 12/06/2022 1614   EOSABS 0.3 01/18/2022 1634  BASOSABS 0.0 12/06/2022 1614   BASOSABS 0.0 01/18/2022 1634      UA  ordered   Urine analysis:    Component Value Date/Time   COLORURINE YELLOW 12/29/2013 1006   APPEARANCEUR Clear 08/06/2021 1037   LABSPEC 1.010 12/29/2013 1006   PHURINE 5.5 12/29/2013 1006   GLUCOSEU Negative 08/06/2021 1037   HGBUR NEGATIVE 12/29/2013 1006   HGBUR trace-intact 01/18/2010 1534   BILIRUBINUR Negative 08/06/2021 1037   KETONESUR NEGATIVE 12/29/2013 1006   PROTEINUR Negative 08/06/2021 1037   PROTEINUR NEGATIVE 12/29/2013 1006   UROBILINOGEN 0.2 12/29/2013 1006   NITRITE Negative 08/06/2021 1037   NITRITE NEGATIVE 12/29/2013 1006   LEUKOCYTESUR Trace (A) 08/06/2021 1037    Results for orders placed or performed during the hospital encounter of 12/08/22  Resp panel by RT-PCR (RSV, Flu A&B, Covid) Anterior Nasal Swab     Status: None   Collection Time: 12/08/22 10:13 PM   Specimen: Anterior Nasal Swab  Result Value Ref Range Status   SARS Coronavirus 2 by RT PCR NEGATIVE NEGATIVE Final         Influenza A by PCR NEGATIVE NEGATIVE Final   Influenza B by PCR NEGATIVE NEGATIVE Final         Resp Syncytial Virus by PCR NEGATIVE NEGATIVE Final          _______________________________________________ Hospitalist was called for admission for   Pneumonia due to infectious organism, unspecified laterality, unspecified part of lung    Elevated troponin    The following Work up has been ordered so far:  Orders Placed This Encounter  Procedures  . Resp panel by RT-PCR (RSV, Flu A&B, Covid) Anterior Nasal Swab  . Blood culture (routine x 2)  . DG Chest 2 View  . CT Angio Chest PE W and/or Wo Contrast  . CBC  . Comprehensive metabolic panel  . Brain natriuretic peptide  . Lactic acid, plasma  . Cardiac monitoring  . Utilize spacer/aerochamber with mdi inhaler for COVID-19 positive  patients or PUI for COVID-19  . If O2 Sat <94% administer O2 at 2 liters/minute via nasal cannula  . Inpatient consult to Cardiology  . Consult to hospitalist  . Pulse oximetry, continuous  . Adult wheeze protocol - initiate by RT  . Pulse oximetry, continuous  . ED EKG  . EKG 12-Lead  . EKG 12-Lead     OTHER Significant initial  Findings:  labs showing:    Recent Labs  Lab 12/06/22 1614 12/08/22 2122  NA 137 137  K 3.9 3.8  CO2 25 24  GLUCOSE 251* 238*  BUN 8 20  CREATININE 0.85 1.01*  CALCIUM 8.8* 9.2    Cr    stable,    Lab Results  Component Value Date   CREATININE 1.01 (H) 12/08/2022   CREATININE 0.85 12/06/2022   CREATININE 0.87 08/03/2022    Recent Labs  Lab 12/06/22 1614 12/08/22 2122  AST 41 53*  ALT 43 50*  ALKPHOS 85 79  BILITOT 0.2* 0.5  PROT 7.1 7.3  ALBUMIN 3.9 4.1   Lab Results  Component Value Date   CALCIUM 9.2 12/08/2022    Plt: Lab Results  Component Value Date   PLT 296 12/08/2022      COVID-19 Labs  No results for input(s): "DDIMER", "FERRITIN", "LDH", "CRP" in the last 72 hours.  Lab Results  Component Value Date   SARSCOV2NAA NEGATIVE 12/08/2022   [place] NEGATIVE 12/06/2022   [place] Not Detected 06/14/2021   SARSCOV2NAA NEGATIVE  09/19/2020     Arterial ***Venous  Blood Gas result:  pH *** pCO2 ***; pO2 ***;     %O2 Sat ***.  ABG No results found for: "PHART", "PCO2ART", "PO2ART", "HCO3", "TCO2", "ACIDBASEDEF", "O2SAT"       Recent Labs  Lab 12/06/22 1614 12/08/22 2122  WBC 10.0 17.2*  NEUTROABS 8.8*  --   HGB 14.3 15.3*  HCT 43.7 46.0  MCV 97.1 96.8  PLT 285 296    HG/HCT * stable,  Down *Up from baseline see below    Component Value Date/Time   HGB 15.3 (H) 12/08/2022 2122   HGB 15.3 08/03/2022 1342   HCT 46.0 12/08/2022 2122   HCT 46.1 08/03/2022 1342   MCV 96.8 12/08/2022 2122   MCV 95 08/03/2022 1342      No results for input(s): "LIPASE", "AMYLASE" in the last 168 hours. No  results for input(s): "AMMONIA" in the last 168 hours.    Cardiac Panel (last 3 results) No results for input(s): "CKTOTAL", "CKMB", "TROPONINI", "RELINDX" in the last 72 hours.  .car BNP (last 3 results) Recent Labs    12/08/22 2122  BNP 190.0*      DM  labs:  HbA1C: Recent Labs    12/10/21 1157 04/18/22 0922  HGBA1C 6.6* 6.5*       CBG (last 3)  No results for input(s): "GLUCAP" in the last 72 hours.        Cultures: No results found for: "SDES", "SPECREQUEST", "CULT", "REPTSTATUS"   Radiological Exams on Admission: CT Angio Chest PE W and/or Wo Contrast  Result Date: 12/08/2022 CLINICAL DATA:  Pulmonary embolus suspected with high probability. Worsening shortness of breath. EXAM: CT ANGIOGRAPHY CHEST WITH CONTRAST TECHNIQUE: Multidetector CT imaging of the chest was performed using the standard protocol during bolus administration of intravenous contrast. Multiplanar CT image reconstructions and MIPs were obtained to evaluate the vascular anatomy. RADIATION DOSE REDUCTION: This exam was performed according to the departmental dose-optimization program which includes automated exposure control, adjustment of the mA and/or kV according to patient size and/or use of iterative reconstruction technique. CONTRAST:  159m OMNIPAQUE IOHEXOL 350 MG/ML SOLN COMPARISON:  Chest radiograph 12/08/2022. Cardiac CT 06/18/2021. CT angio chest 09/19/2020 FINDINGS: Cardiovascular: There is good opacification of the central and segmental pulmonary arteries representing technically adequate study. No focal filling defects are identified. No evidence of significant pulmonary embolus. Normal heart size. No pericardial effusions. Normal caliber thoracic aorta. No evidence of aortic dissection. Calcification of the aorta and coronary arteries. Mediastinum/Nodes: Esophagus is decompressed. No significant lymphadenopathy. Thyroid gland is unremarkable. Lungs/Pleura: Emphysematous changes throughout the  lungs with scarring in the lung apices. Focal area of patchy infiltration demonstrated in the left lingula and lower lung which could indicate early pneumonia. Right lung is clear. No pleural effusions. No pneumothorax. Upper Abdomen: Diffuse fatty infiltration of the liver. No acute process demonstrated in the upper abdomen. Musculoskeletal: Degenerative changes in the spine. Review of the MIP images confirms the above findings. IMPRESSION: 1. No evidence of significant pulmonary embolus. 2. Focal area of patchy infiltration demonstrated in the left lower lung and lingula likely representing an area of pneumonia. 3. Aortic atherosclerosis. 4. Emphysematous changes in the lungs. 5. Fatty infiltration of the liver. Electronically Signed   By: WLucienne CapersM.D.   On: 12/08/2022 22:32   DG Chest 2 View  Result Date: 12/08/2022 CLINICAL DATA:  Dyspnea EXAM: CHEST - 2 VIEW COMPARISON:  12/06/2022 FINDINGS: The lungs are mildly hyperinflated paucity of  vasculature within the lung apices in keeping with changes of emphysema. No superimposed confluent pulmonary infiltrate. No pneumothorax or pleural effusion. Cardiac size within normal limits. Pulmonary vascularity is normal. No acute bone abnormality. IMPRESSION: 1. No active cardiopulmonary disease. 2. Emphysema. Electronically Signed   By: Fidela Salisbury M.D.   On: 12/08/2022 20:53   _______________________________________________________________________________________________________ Latest  Blood pressure (!) 148/82, pulse 71, temperature 98.1 F (36.7 C), temperature source Oral, resp. rate 12, SpO2 95 %.   Vitals  labs and radiology finding personally reviewed  Review of Systems:    Pertinent positives include: ***  Constitutional:  No weight loss, night sweats, Fevers, chills, fatigue, weight loss  HEENT:  No headaches, Difficulty swallowing,Tooth/dental problems,Sore throat,  No sneezing, itching, ear ache, nasal congestion, post nasal drip,   Cardio-vascular:  No chest pain, Orthopnea, PND, anasarca, dizziness, palpitations.no Bilateral lower extremity swelling  GI:  No heartburn, indigestion, abdominal pain, nausea, vomiting, diarrhea, change in bowel habits, loss of appetite, melena, blood in stool, hematemesis Resp:  no shortness of breath at rest. No dyspnea on exertion, No excess mucus, no productive cough, No non-productive cough, No coughing up of blood.No change in color of mucus.No wheezing. Skin:  no rash or lesions. No jaundice GU:  no dysuria, change in color of urine, no urgency or frequency. No straining to urinate.  No flank pain.  Musculoskeletal:  No joint pain or no joint swelling. No decreased range of motion. No back pain.  Psych:  No change in mood or affect. No depression or anxiety. No memory loss.  Neuro: no localizing neurological complaints, no tingling, no weakness, no double vision, no gait abnormality, no slurred speech, no confusion  All systems reviewed and apart from [place] all are negative _______________________________________________________________________________________________ Past Medical History:   Past Medical History:  Diagnosis Date  . Allergic rhinitis 02/25/2008   Qualifier: Diagnosis of  By: Esmeralda Arthur    . Angina pectoris ([place]) 06/09/2021  . Aortic atherosclerosis ([place]) 08/14/2017   Appreciated on CXR 08/14/2017  . Asthma   . Asthmatic bronchitis 12/07/2021  . Atrophy of vagina 12/07/2021  . Centrilobular emphysema ([place]) 05/28/2018  . Controlled diabetes mellitus type 2 with complications ([place]) [id number]   Has FLD.  Marland Kitchen COPD Gold C 11/02/2007   Formatting of this note might be different from the original. PFT 05/28/2019           ratio 50% FEV1 53% FVC 83%    DLCO 54% PFT 09/25/18 ([facility]) ratio 52% FEV1 56% FVC 108%; DLCO 69%  . Coronary artery disease   . Dysphagia 12/07/2021  . Dyspnea on exertion 08/21/2018  . Essential hypertension 12/07/2021  . Facet arthropathy,  lumbar 12/15/2021  . Family history of osteoporosis 04/22/2019  . Family history of rheumatoid arthritis 06/05/2019  . Fatty liver disease, nonalcoholic   . Ganglion of hand 12/07/2021  . GERD 11/02/2007   Qualifier: Diagnosis of  By: Esmeralda Arthur    . Hemorrhoids 12/07/2021  . Hyperlipidemia associated with type 2 diabetes mellitus ([place]) 03/24/2020  . Hypersomnia with sleep apnea 05/28/2018  . HYPERTENSION, BENIGN 12/24/2008   Qualifier: Diagnosis of  By: Stanford Breed, MD, Kandyce Rud   . Hypothyroidism 06/13/2007   ? Hashimotos.  Has strong family hx autoimmune d/o No h/o thyroidectomy or radioablation.   . IBS (irritable bowel syndrome)   . Lateral epicondylitis of left elbow 06/05/2019  . Lung disease, bullous ([place]) 08/14/2017  . Menopause 12/07/2021  . Obesity (BMI 30.0-34.9)  05/28/2018  . Osteopenia of multiple sites 12/15/2021  . Overactive bladder 06/09/2017  . Pain in female genitalia on intercourse 12/07/2021  . Polyp of colon 12/07/2021  . Post-menopausal 04/22/2019  . Precordial pain    Sees Dr Stanford Breed.  Last cardiac cath 2016 = negative. Strong family h/o MI and CAD  . Psychophysiological insomnia 05/28/2018  . Reactive depression 06/26/2018  . Rosacea 08/01/2007   Qualifier: Diagnosis of  By: Esmeralda Arthur    . Snoring 05/28/2018  . Stage 2 moderate COPD by GOLD classification ([place]) 11/02/2007   Formatting of this note might be different from the original. PFT 05/28/2019           ratio 50% FEV1 53% FVC 83%    DLCO 54% PFT 09/25/18 ([facility]) ratio 52% FEV1 56% FVC 108%; DLCO 69%  . Statin intolerance 04/10/2019  . Stress incontinence in female 06/09/2017  . UTI'S, RECURRENT 06/13/2007   Qualifier: Diagnosis of  By: Esmeralda Arthur    . Vitamin D insufficiency 12/15/2021      Past Surgical History:  Procedure Laterality Date  . ABLATION    . CARDIAC CATHETERIZATION N/A 06/12/2015   Procedure: Left Heart Cath and Coronary Angiography;  Surgeon: Burnell Blanks, MD;  Location: [facility];  Service: Cardiovascular;  Laterality: N/A;  . CESAREAN SECTION    . LTCS     x2  . NASAL SINUS SURGERY    . SPIROMETRY  01/05/2007   FVC 68% predicted; FEV1 44% predicted; FEV1/FVC 63% predicted = moderate obstruction with low vital capacity possibly due to restriction  . SQUAMOUS CELL CARCINOMA EXCISION    . TUBAL LIGATION      Social History:  Ambulatory *** independently cane, walker  wheelchair bound, bed bound     reports that she quit smoking about 15 years ago. Her smoking use included cigarettes. She has a 15.00 pack-year smoking history. She has never used smokeless tobacco. She reports current alcohol use of about 1.0 standard drink of alcohol per week. She reports that she does not use drugs.     Family History: *** Family History  Problem Relation Age of Onset  . Hypertension Father   . Hyperlipidemia Father   . Heart disease Father        Died at age 59 of MI  . Hypothyroidism Father   . Diabetes Maternal Grandmother   . Raynaud syndrome Sister   . Rheum arthritis Sister   . Thyroid disease Sister   . CAD Brother        has 4 stents  . Rheum arthritis Brother   . Pulmonary fibrosis Brother   . Heart disease Sister 3       h/o MI @age$  9  . Thyroid disease Sister   . Heart disease Mother   . Hypothyroidism Mother   . Macular degeneration Mother   . Breast cancer Neg Hx   . Ovarian cancer Neg Hx   . Prostate cancer Neg Hx   . Colon cancer Neg Hx   . Stomach cancer Neg Hx   . Esophageal cancer Neg Hx    ______________________________________________________________________________________________ Allergies: Allergies  Allergen Reactions  . Avelox [Moxifloxacin Hcl In Nacl] Hives  . Ciprofloxacin     REACTION: Rash  . Livalo [Pitavastatin]   . Metformin And Related     kidney  . Moxifloxacin     REACTION: RASH  . Bactrim [Sulfamethoxazole-Trimethoprim] Rash  . Statins Other (See Comments)  myalgias  . Sulfamethoxazole-Trimethoprim Rash    REACTION: Rash     Prior to Admission medications   Medication Sig Start Date End Date Taking? Authorizing Provider  albuterol (PROVENTIL) (2.5 MG/3ML) 0.083% nebulizer solution Take 3 mLs (2.5 mg total) by nebulization every 6 (six) hours as needed for wheezing or shortness of breath. 12/06/22   Suzy Bouchard, PA-C  albuterol (VENTOLIN HFA) 108 (90 Base) MCG/ACT inhaler Inhale 2 puffs into the lungs every 6 (six) hours as needed for wheezing or shortness of breath. 04/22/22   [name], DO  benzonatate (TESSALON) 100 MG capsule Take 1 capsule (100 mg total) by mouth every 8 (eight) hours. 12/06/22   Suzy Bouchard, PA-C  Evolocumab (REPATHA) 140 MG/ML SOSY Inject 140 mg into the skin every 14 (fourteen) days. 07/11/22   Revankar, Reita Cliche, MD  furosemide (LASIX) 20 MG tablet Take 20 mg by mouth daily. 08/05/22   [provider]  levofloxacin (LEVAQUIN) 750 MG tablet Take 750 mg by mouth daily. 11/29/22   [provider]  levothyroxine (SYNTHROID) 75 MCG tablet Take 1 tablet (75 mcg total) by mouth daily. 04/18/22   [name], DO  loratadine (CLARITIN) 10 MG tablet Take 10 mg by mouth daily.    [provider]  metoprolol succinate (TOPROL XL) 25 MG 24 hr tablet Take 1 tablet (25 mg total) by mouth daily. 08/03/22   Revankar, Reita Cliche, MD  Omeprazole-Sodium Bicarbonate (ZEGERID) 20-1100 MG CAPS capsule Take by mouth.    [provider]  umeclidinium-vilanterol (ANORO ELLIPTA) 62.5-25 MCG/ACT AEPB Inhale 1 puff into the lungs daily. 12/29/21   Collene Gobble, MD  Vitamin D, Ergocalciferol, (DRISDOL) 1.25 MG (50000 UNIT) CAPS capsule Take 1 capsule (50,000 Units total) by mouth every 7 (seven) days. 12/15/21   [name], DO    ___________________________________________________________________________________________________ Physical Exam:    12/08/2022   11:00 PM 12/08/2022    10:45 PM 12/08/2022   10:00 PM  Vitals with BMI  Systolic  [id number]  Diastolic  82 85  Pulse 71 77 80     1. General:  in No ***Acute distress***increased work of breathing ***complaining of severe pain****agitated * Chronically ill *well *cachectic *toxic acutely ill -appearing 2. Psychological: Alert and *** Oriented 3. Head/ENT:   Moist *** Dry Mucous Membranes                          Head Non traumatic, neck supple                          Normal *** Poor Dentition 4. SKIN: normal *** decreased Skin turgor,  Skin clean Dry and intact no rash 5. Heart: Regular rate and rhythm no*** Murmur, no Rub or gallop 6. Lungs: ***Clear to auscultation bilaterally, no wheezes or crackles   7. Abdomen: Soft, ***non-tender, Non distended *** obese ***bowel sounds present 8. Lower extremities: no clubbing, cyanosis, no ***edema 9. Neurologically Grossly intact, moving all 4 extremities equally *** strength 5 out of 5 in all 4 extremities cranial nerves II through XII intact 10. MSK: Normal range of motion    Chart has been reviewed  ______________________________________________________________________________________________  Assessment/Plan  ***  Admitted for *** Pneumonia due to infectious organism, unspecified laterality, unspecified part of lung ***  Hypoxia ***  Elevated troponin ***    Present on Admission: **None**     No problem-specific Assessment &  Plan notes found for this encounter.    Other plan as per orders.  DVT prophylaxis:  SCD *** Lovenox       Code Status:    Code Status: Prior FULL CODE *** DNR/DNI ***comfort care as per patient ***family  I had personally discussed CODE STATUS with patient and family* I had spent *min discussing goals of care and CODE STATUS    Family Communication:   Family not at  Bedside  plan of care was discussed on the phone with *** Son, Daughter, Wife, Husband, Sister, Brother , father, mother  Disposition Plan:    *** likely will need placement for rehabilitation                          Back to current facility when stable                            To home once workup is complete and patient is stable  ***Following barriers for discharge:                            Electrolytes corrected                               Anemia corrected                             Pain controlled with PO medications                               Afebrile, white count improving able to transition to PO antibiotics                             Will need to be able to tolerate PO                            Will likely need home health, home O2, set up                           Will need consultants to evaluate patient prior to discharge  ****EXPECT DC tomorrow                    ***Would benefit from PT/OT eval prior to DC  Ordered                   Swallow eval - SLP ordered                   Diabetes care coordinator                   Transition of care consulted                   Nutrition    consulted                  Wound care  consulted                   Palliative care    consulted  Behavioral health  consulted                    Consults called: ***    Admission status:  ED Disposition     ED Disposition  Admit   Condition  --   Comment  The patient appears reasonably stabilized for admission considering the current resources, flow, and capabilities available in the ED at this time, and I doubt any other [facility] requiring further screening and/or treatment in the ED prior to admission is  present.           inpatient     I Expect 2 midnight stay secondary to severity of patient's current illness need for inpatient interventions justified by the following:  hemodynamic instability despite optimal treatment ( hypoxia, )   Severe lab/radiological/exam abnormalities including:   COPD exacerbation and pneumonia and extensive comorbidities including:  DM2    CHF   CAD   COPD/asthma   Obesity    That are currently affecting medical management.   I expect  patient to be hospitalized for 2 midnights requiring inpatient medical care.  Patient is at high risk for adverse outcome (such as loss of life or disability) if not treated.  Indication for inpatient stay as follows:    New or worsening hypoxia   Need for IV antibiotics,      Level of care           progressive tele indefinitely please discontinue once patient no longer qualifies COVID-19 Labs    Lab Results  Component Value Date   [place] 12/08/2022     Precautions: admitted as   Covid Negative   Torra Pala 12/08/2022, 11:20 PM ***  [facility]     after 2 AM please page floor coverage PA If 7AM-7PM, please contact the day team taking care of the patient using [email or web address]   Patient was evaluated in the context of the global COVID-19 pandemic, which necessitated consideration that the patient might be at risk for infection with the SARS-CoV-2 virus that causes COVID-19. Institutional protocols and algorithms that pertain to the evaluation of patients at risk for COVID-19 are in a state of rapid change based on information released by regulatory bodies including the CDC and federal and state organizations. These policies and algorithms were followed during the patient's care.    Because it is abnormal MRISynthCheck TSHroid at 75 mcg p.o. daily

## 2022-12-09 NOTE — Progress Notes (Signed)
  Echocardiogram 2D Echocardiogram has been performed.  Stacy [name] 12/09/2022, 3:05 PM

## 2022-12-09 NOTE — ED Notes (Signed)
ED TO INPATIENT HANDOFF REPORT  ED Nurse Name and Phone #: Rocco Pauls Name/Age/Gender Stacy [name] 67 y.o. female Room/Bed: WA25/WA25  Code Status   Code Status: Full Code  Home/SNF/Other Home Patient oriented to: self, place, time, and situation Is this baseline? Yes   Triage Complete: Triage complete  Chief Complaint CAP (community acquired pneumonia) [J18.9] Acute hypoxemic respiratory failure ([place]) [J96.01]  Triage Note Patient arrived with complaints of shortness of breath, chest pain, and coughing up a "rust colored sputum" states seen at [facility] two days ago for same and has had no relief. 2L of oxygen at baseline. Hx of COPD    Allergies Allergies  Allergen Reactions   Avelox [Moxifloxacin Hcl In Nacl] Hives   Ciprofloxacin     REACTION: Rash   Livalo [Pitavastatin]    Metformin And Related     kidney   Moxifloxacin     REACTION: RASH   Bactrim [Sulfamethoxazole-Trimethoprim] Rash   Statins Other (See Comments)    myalgias   Sulfamethoxazole-Trimethoprim Rash    REACTION: Rash    Level of Care/Admitting Diagnosis ED Disposition     ED Disposition  Admit   Condition  --   Comment  Hospital Area: [facility] [[id number]]  Level of Care: Progressive [102]  Admit to Progressive based on following criteria: RESPIRATORY PROBLEMS hypoxemic/hypercapnic respiratory failure that is responsive to NIPPV (BiPAP) or High Flow Nasal Cannula (6-80 lpm). Frequent assessment/intervention, no > Q2 hrs < Q4 hrs, to maintain oxygenation and pulmonary hygiene.  May admit patient to [facility] or [facility] if equivalent level of care is available:: No  Covid Evaluation: Asymptomatic - no recent exposure (last 10 days) testing not required  Diagnosis: Acute hypoxemic respiratory failure [id number]  Admitting Physician: [place], [place]  Attending Physician: Debbe Odea [id number]  Certification:: I certify this patient will need inpatient  services for at least 2 midnights          B Medical/Surgery History Past Medical History:  Diagnosis Date   Allergic rhinitis 02/25/2008   Qualifier: Diagnosis of  By: Esmeralda Arthur     Angina pectoris ([place]) 06/09/2021   Aortic atherosclerosis ([place]) 08/14/2017   Appreciated on CXR 08/14/2017   Asthma    Asthmatic bronchitis 12/07/2021   Atrophy of vagina 12/07/2021   Centrilobular emphysema ([place]) 05/28/2018   Controlled diabetes mellitus type 2 with complications ([place]) [id number]   Has FLD.   COPD Gold C 11/02/2007   Formatting of this note might be different from the original. PFT 05/28/2019           ratio 50% FEV1 53% FVC 83%    DLCO 54% PFT 09/25/18 ([facility]) ratio 52% FEV1 56% FVC 108%; DLCO 69%   Coronary artery disease    Dysphagia 12/07/2021   Dyspnea on exertion 08/21/2018   Essential hypertension 12/07/2021   Facet arthropathy, lumbar 12/15/2021   Family history of osteoporosis 04/22/2019   Family history of rheumatoid arthritis 06/05/2019   Fatty liver disease, nonalcoholic    Ganglion of hand 12/07/2021   GERD 11/02/2007   Qualifier: Diagnosis of  By: Valetta Close DO, Karen     Hemorrhoids 12/07/2021   Hyperlipidemia associated with type 2 diabetes mellitus ([place]) 03/24/2020   Hypersomnia with sleep apnea 05/28/2018   HYPERTENSION, BENIGN 12/24/2008   Qualifier: Diagnosis of  By: Stanford Breed, MD, Kandyce Rud    Hypothyroidism 06/13/2007   ? Hashimotos.  Has strong family hx autoimmune d/o No  h/o thyroidectomy or radioablation.    IBS (irritable bowel syndrome)    Lateral epicondylitis of left elbow 06/05/2019   Lung disease, bullous ([place]) 08/14/2017   Menopause 12/07/2021   Obesity (BMI 30.0-34.9) 05/28/2018   Osteopenia of multiple sites 12/15/2021   Overactive bladder 06/09/2017   Pain in female genitalia on intercourse 12/07/2021   Polyp of colon 12/07/2021   Post-menopausal 04/22/2019   Precordial pain    Sees Dr Stanford Breed.  Last cardiac cath 2016 =  negative. Strong family h/o MI and CAD   Psychophysiological insomnia 05/28/2018   Reactive depression 06/26/2018   Rosacea 08/01/2007   Qualifier: Diagnosis of  By: Valetta Close DO, Karen     Snoring 05/28/2018   Stage 2 moderate COPD by GOLD classification ([place]) 11/02/2007   Formatting of this note might be different from the original. PFT 05/28/2019           ratio 50% FEV1 53% FVC 83%    DLCO 54% PFT 09/25/18 ([facility]) ratio 52% FEV1 56% FVC 108%; DLCO 69%   Statin intolerance 04/10/2019   Stress incontinence in female 06/09/2017   UTI'S, RECURRENT 06/13/2007   Qualifier: Diagnosis of  By: Esmeralda Arthur     Vitamin D insufficiency 12/15/2021   Past Surgical History:  Procedure Laterality Date   ABLATION     CARDIAC CATHETERIZATION N/A 06/12/2015   Procedure: Left Heart Cath and Coronary Angiography;  Surgeon: Burnell Blanks, MD;  Location: [facility] [place] [facility];  Service: Cardiovascular;  Laterality: N/A;   CESAREAN SECTION     LTCS     x2   NASAL SINUS SURGERY     SPIROMETRY  01/05/2007   FVC 68% predicted; FEV1 44% predicted; FEV1/FVC 63% predicted = moderate obstruction with low vital capacity possibly due to restriction   SQUAMOUS CELL CARCINOMA EXCISION     TUBAL LIGATION       A IV Location/Drains/Wounds Patient Lines/Drains/Airways Status     Active Line/Drains/Airways     Name Placement date Placement time Site Days   Peripheral IV 12/08/22 20 G 1" Left Antecubital 12/08/22  2123  Antecubital  1            Intake/Output Last 24 hours No intake or output data in the 24 hours ending 12/09/22 1417  Labs/Imaging Results for orders placed or performed during the hospital encounter of 12/08/22 (from the past 48 hour(s))  Strep pneumoniae urinary antigen     Status: None   Collection Time: 12/08/22 12:45 AM  Result Value Ref Range   Strep Pneumo Urinary Antigen NEGATIVE NEGATIVE    Comment:        Infection due to S. pneumoniae cannot be absolutely ruled  out since the antigen present may be below the detection limit of the test. Performed at [facility], [address], [place], [place] [address]   CBC     Status: Abnormal   Collection Time: 12/08/22  9:22 PM  Result Value Ref Range   WBC 17.2 (H) 4.0 - 10.5 K/uL   RBC 4.75 3.87 - 5.11 MIL/uL   Hemoglobin 15.3 (H) 12.0 - 15.0 g/dL   HCT 46.0 36.0 - 46.0 %   MCV 96.8 80.0 - 100.0 fL   MCH 32.2 26.0 - 34.0 pg   MCHC 33.3 30.0 - 36.0 g/dL   RDW 12.6 11.5 - 15.5 %   Platelets 296 150 - 400 K/uL   nRBC 0.0 0.0 - 0.2 %    Comment:  Performed at [facility], [place] [address], [place], [place] [address]  Troponin I (High Sensitivity)     Status: Abnormal   Collection Time: 12/08/22  9:22 PM  Result Value Ref Range   Troponin I (High Sensitivity) 579 (HH) <18 ng/L    Comment: CRITICAL RESULT CALLED TO, READ BACK BY AND VERIFIED WITH [place],K EMTP AT 2207 ON 12/08/22 BY VAZQUEZJ DELTA CHECK NOTED (NOTE) Elevated high sensitivity troponin I (hsTnI) values and significant  changes across serial measurements may suggest ACS but many other  chronic and acute conditions are known to elevate hsTnI results.  Refer to the "Links" section for chest pain algorithms and additional  guidance. Performed at [facility], [place] [address] [place], [place] [address]   Comprehensive metabolic panel     Status: Abnormal   Collection Time: 12/08/22  9:22 PM  Result Value Ref Range   Sodium 137 135 - 145 mmol/L   Potassium 3.8 3.5 - 5.1 mmol/L   Chloride 99 98 - 111 mmol/L   CO2 24 22 - 32 mmol/L   Glucose, Bld 238 (H) 70 - 99 mg/dL    Comment: Glucose reference range applies only to samples taken after fasting for at least 8 hours.   BUN 20 8 - 23 mg/dL   Creatinine, Ser 1.01 (H) 0.44 - 1.00 mg/dL   Calcium 9.2 8.9 - 10.3 mg/dL   Total Protein 7.3 6.5 - 8.1 g/dL   Albumin 4.1 3.5 - 5.0 g/dL   AST 53 (H) 15 - 41 U/L   ALT 50 (H) 0 - 44 U/L   Alkaline  Phosphatase 79 38 - 126 U/L   Total Bilirubin 0.5 0.3 - 1.2 mg/dL   GFR, Estimated >60 >60 mL/min    Comment: (NOTE) Calculated using the CKD-EPI Creatinine Equation (2021)    Anion gap 14 5 - 15    Comment: Performed at [facility], [place] [address], [place], [place] [address]  Brain natriuretic peptide     Status: Abnormal   Collection Time: 12/08/22  9:22 PM  Result Value Ref Range   B Natriuretic Peptide 190.0 (H) 0.0 - 100.0 pg/mL    Comment: Performed at [facility], [place] [address] [place], [place] [address]  Resp panel by RT-PCR (RSV, Flu A&B, Covid) Anterior Nasal Swab     Status: None   Collection Time: 12/08/22 10:13 PM   Specimen: Anterior Nasal Swab  Result Value Ref Range   SARS Coronavirus 2 by RT PCR NEGATIVE NEGATIVE    Comment: (NOTE) SARS-CoV-2 target nucleic acids are NOT DETECTED.  The SARS-CoV-2 RNA is generally detectable in upper respiratory specimens during the acute phase of infection. The lowest concentration of SARS-CoV-2 viral copies this assay can detect is 138 copies/mL. A negative result does not preclude SARS-Cov-2 infection and should not be used as the sole basis for treatment or other patient management decisions. A negative result may occur with  improper specimen collection/handling, submission of specimen other than nasopharyngeal swab, presence of viral mutation(s) within the areas targeted by this assay, and inadequate number of viral copies(<138 copies/mL). A negative result must be combined with clinical observations, patient history, and epidemiological information. The expected result is Negative.  Fact Sheet for Patients:  EntrepreneurPulse.com.au  Fact Sheet for Healthcare Providers:  IncredibleEmployment.be  This test is no t yet approved or cleared by the [place] FDA and  has been authorized for detection and/or diagnosis of SARS-CoV-2  by FDA under  an Emergency Use Authorization (EUA). This EUA will remain  in effect (meaning this test can be used) for the duration of the COVID-19 declaration under Section 564(b)(1) of the Act, 21 U.S.C.section 360bbb-3(b)(1), unless the authorization is terminated  or revoked sooner.       Influenza A by PCR NEGATIVE NEGATIVE   Influenza B by PCR NEGATIVE NEGATIVE    Comment: (NOTE) The Xpert Xpress SARS-CoV-2/FLU/RSV plus assay is intended as an aid in the diagnosis of influenza from Nasopharyngeal swab specimens and should not be used as a sole basis for treatment. Nasal washings and aspirates are unacceptable for Xpert Xpress SARS-CoV-2/FLU/RSV testing.  Fact Sheet for Patients: EntrepreneurPulse.com.au  Fact Sheet for Healthcare Providers: IncredibleEmployment.be  This test is not yet approved or cleared by the [place] FDA and has been authorized for detection and/or diagnosis of SARS-CoV-2 by FDA under an Emergency Use Authorization (EUA). This EUA will remain in effect (meaning this test can be used) for the duration of the COVID-19 declaration under Section 564(b)(1) of the Act, 21 U.S.C. section 360bbb-3(b)(1), unless the authorization is terminated or revoked.     Resp Syncytial Virus by PCR NEGATIVE NEGATIVE    Comment: (NOTE) Fact Sheet for Patients: EntrepreneurPulse.com.au  Fact Sheet for Healthcare Providers: IncredibleEmployment.be  This test is not yet approved or cleared by the [place] FDA and has been authorized for detection and/or diagnosis of SARS-CoV-2 by FDA under an Emergency Use Authorization (EUA). This EUA will remain in effect (meaning this test can be used) for the duration of the COVID-19 declaration under Section 564(b)(1) of the Act, 21 U.S.C. section 360bbb-3(b)(1), unless the authorization is terminated or revoked.  Performed at [facility], [place] [address] [place], [place] [address]   Lactic acid, plasma     Status: Abnormal   Collection Time: 12/08/22 10:32 PM  Result Value Ref Range   Lactic Acid, Venous 2.1 (HH) 0.5 - 1.9 mmol/L    Comment: CRITICAL VALUE NOTED. VALUE IS CONSISTENT WITH PREVIOUSLY REPORTED/CALLED VALUE Performed at [place] [address], [place], [place] [address]   Troponin I (High Sensitivity)     Status: Abnormal   Collection Time: 12/08/22 11:32 PM  Result Value Ref Range   Troponin I (High Sensitivity) 413 (HH) <18 ng/L    Comment: CRITICAL VALUE NOTED. VALUE IS CONSISTENT WITH PREVIOUSLY REPORTED/CALLED VALUE DELTA CHECK NOTED (NOTE) Elevated high sensitivity troponin I (hsTnI) values and significant  changes across serial measurements may suggest ACS but many other  chronic and acute conditions are known to elevate hsTnI results.  Refer to the "Links" section for chest pain algorithms and additional  guidance. Performed at [facility], [place] [address], [place], [place] [address]   Blood culture (routine x 2)     Status: None (Preliminary result)   Collection Time: 12/08/22 11:32 PM   Specimen: Left Antecubital; Blood  Result Value Ref Range   Specimen Description      LEFT ANTECUBITAL BLOOD Performed at [facility], [place] [address] [place], [place] [address]    Special Requests      BOTTLES DRAWN AEROBIC AND ANAEROBIC Blood Culture adequate volume Performed at [place] [address] [place], [place] [address]    Culture PENDING    Report Status PENDING   CK     Status: None   Collection Time: 12/08/22 11:32 PM  Result Value Ref Range  Total CK 144 38 - 234 U/L    Comment: Performed at [facility], [place] [address], [place], [place] [address]  Phosphorus     Status: None   Collection Time: 12/08/22 11:32 PM  Result Value Ref Range   Phosphorus 2.8 2.5 - 4.6 mg/dL    Comment: Performed at  [facility], [place] [address] [place], [place] [address]  Magnesium     Status: Abnormal   Collection Time: 12/08/22 11:32 PM  Result Value Ref Range   Magnesium 3.7 (H) 1.7 - 2.4 mg/dL    Comment: Performed at [facility], [place] [address] [place], [place] [address]  Blood culture (routine x 2)     Status: None (Preliminary result)   Collection Time: 12/08/22 11:43 PM   Specimen: Right Antecubital; Blood  Result Value Ref Range   Specimen Description      RIGHT ANTECUBITAL BLOOD Performed at [facility], [place] [address], [place], [place] [address]    Special Requests      BOTTLES DRAWN AEROBIC AND ANAEROBIC Blood Culture adequate volume Performed at [place] [address], [place], [place] [address]    Culture PENDING    Report Status PENDING   Respiratory (~20 pathogens) panel by PCR     Status: Abnormal   Collection Time: 12/08/22 11:46 PM   Specimen: Nasopharyngeal Swab; Respiratory  Result Value Ref Range   Adenovirus NOT DETECTED NOT DETECTED   Coronavirus 229E NOT DETECTED NOT DETECTED    Comment: (NOTE) The Coronavirus on the Respiratory Panel, DOES NOT test for the novel  Coronavirus (2019 nCoV)    Coronavirus HKU1 NOT DETECTED NOT DETECTED   Coronavirus NL63 NOT DETECTED NOT DETECTED   Coronavirus OC43 NOT DETECTED NOT DETECTED   Metapneumovirus DETECTED (A) NOT DETECTED   Rhinovirus / Enterovirus NOT DETECTED NOT DETECTED   Influenza A NOT DETECTED NOT DETECTED   Influenza B NOT DETECTED NOT DETECTED   Parainfluenza Virus 1 NOT DETECTED NOT DETECTED   Parainfluenza Virus 2 NOT DETECTED NOT DETECTED   Parainfluenza Virus 3 NOT DETECTED NOT DETECTED   Parainfluenza Virus 4 NOT DETECTED NOT DETECTED   Respiratory Syncytial Virus NOT DETECTED NOT DETECTED   Bordetella pertussis NOT DETECTED NOT DETECTED   Bordetella Parapertussis NOT DETECTED NOT DETECTED   Chlamydophila pneumoniae NOT DETECTED NOT  DETECTED   Mycoplasma pneumoniae NOT DETECTED NOT DETECTED    Comment: Performed at [facility], [place] [address] [place], [place] [address]  CBG monitoring, ED     Status: Abnormal   Collection Time: 12/08/22 11:58 PM  Result Value Ref Range   Glucose-Capillary 205 (H) 70 - 99 mg/dL    Comment: Glucose reference range applies only to samples taken after fasting for at least 8 hours.  Blood gas, venous     Status: Abnormal   Collection Time: 12/09/22 12:13 AM  Result Value Ref Range   pH, Ven 7.44 (H) 7.25 - 7.43   pCO2, Ven 44 44 - 60 mmHg   pO2, Ven 39 32 - 45 mmHg   Bicarbonate 29.9 (H) 20.0 - 28.0 mmol/L   Acid-Base Excess 5.0 (H) 0.0 - 2.0 mmol/L   O2 Saturation 72.8 %   Patient temperature 37.0     Comment: Performed at [facility], [place] [address], [place],  [address]  Lactic acid, plasma     Status: Abnormal   Collection Time: 12/09/22 12:26 AM  Result Value Ref Range   Lactic Acid, Venous 3.7 (HH) 0.5 - 1.9 mmol/L    Comment: CRITICAL RESULT CALLED TO, READ BACK BY AND VERIFIED WITH BERRIER,C RN AT 0110 ON 12/09/22 BY VAZQUEZJ Performed at [facility], [place] [address] [place], [place] [address]   TSH     Status: None   Collection Time: 12/09/22 12:26 AM  Result Value Ref Range   TSH 1.173 0.350 - 4.500 uIU/mL    Comment: Performed by a 3rd Generation assay with a functional sensitivity of <=0.01 uIU/mL. Performed at [facility], [place] [address], [place], [place] [address]   HIV Antibody (routine testing w rflx)     Status: None   Collection Time: 12/09/22 12:26 AM  Result Value Ref Range   HIV Screen 4th Generation wRfx Non Reactive Non Reactive    Comment: Performed at [facility], [place] [address], [place], [place] [address]  CBC     Status: Abnormal   Collection Time: 12/09/22 12:26 AM  Result Value Ref Range   WBC 17.5 (H) 4.0 - 10.5 K/uL   RBC 4.77 3.87 - 5.11 MIL/uL   Hemoglobin 15.2  (H) 12.0 - 15.0 g/dL   HCT 46.6 (H) 36.0 - 46.0 %   MCV 97.7 80.0 - 100.0 fL   MCH 31.9 26.0 - 34.0 pg   MCHC 32.6 30.0 - 36.0 g/dL   RDW 12.6 11.5 - 15.5 %   Platelets 306 150 - 400 K/uL   nRBC 0.0 0.0 - 0.2 %    Comment: Performed at [facility], [place] [address], [place], [place] [address]  Hemoglobin A1c     Status: Abnormal   Collection Time: 12/09/22  2:55 AM  Result Value Ref Range   Hgb A1c MFr Bld 6.8 (H) 4.8 - 5.6 %    Comment: (NOTE) Pre diabetes:          5.7%-6.4%  Diabetes:              >6.4%  Glycemic control for   <7.0% adults with diabetes    Mean Plasma Glucose 148.46 mg/dL    Comment: Performed at [place] [address], [place], [place] [address]  Troponin I (High Sensitivity)     Status: Abnormal   Collection Time: 12/09/22  2:55 AM  Result Value Ref Range   Troponin I (High Sensitivity) 260 (HH) <18 ng/L    Comment: CRITICAL VALUE NOTED. VALUE IS CONSISTENT WITH PREVIOUSLY REPORTED/CALLED VALUE DELTA CHECK NOTED (NOTE) Elevated high sensitivity troponin I (hsTnI) values and significant  changes across serial measurements may suggest ACS but many other  chronic and acute conditions are known to elevate hsTnI results.  Refer to the "Links" section for chest pain algorithms and additional  guidance. Performed at [facility], [place] [address] [place],  [address]   Procalcitonin     Status: None   Collection Time: 12/09/22  2:55 AM  Result Value Ref Range   Procalcitonin <0.10 ng/mL    Comment:        Interpretation: PCT (Procalcitonin) <= 0.5 ng/mL: Systemic infection (sepsis) is not likely. Local bacterial infection is possible. (NOTE)       Sepsis PCT Algorithm           Lower Respiratory Tract  Infection PCT Algorithm    ----------------------------     ----------------------------         PCT < 0.25 ng/mL                PCT < 0.10 ng/mL           Strongly encourage             Strongly discourage   discontinuation of antibiotics    initiation of antibiotics    ----------------------------     -----------------------------       PCT 0.25 - 0.50 ng/mL            PCT 0.10 - 0.25 ng/mL               OR       >80% decrease in PCT            Discourage initiation of                                            antibiotics      Encourage discontinuation           of antibiotics    ----------------------------     -----------------------------         PCT >= 0.50 ng/mL              PCT 0.26 - 0.50 ng/mL               AND        <80% decrease in PCT             Encourage initiation of                                             antibiotics       Encourage continuation           of antibiotics    ----------------------------     -----------------------------        PCT >= 0.50 ng/mL                  PCT > 0.50 ng/mL               AND         increase in PCT                  Strongly encourage                                      initiation of antibiotics    Strongly encourage escalation           of antibiotics                                     -----------------------------                                           PCT <= 0.25 ng/mL  OR                                        > 80% decrease in PCT                                      Discontinue / Do not initiate                                             antibiotics  Performed at [place] [address] [place], [place] [address]   Lactic acid, plasma     Status: Abnormal   Collection Time: 12/09/22  3:05 AM  Result Value Ref Range   Lactic Acid, Venous 4.2 (HH) 0.5 - 1.9 mmol/L    Comment: CRITICAL VALUE NOTED. VALUE IS CONSISTENT WITH PREVIOUSLY REPORTED/CALLED VALUE Performed at [place] [address] [place], [place] [address]   CBG monitoring, ED     Status: Abnormal    Collection Time: 12/09/22  3:48 AM  Result Value Ref Range   Glucose-Capillary 256 (H) 70 - 99 mg/dL    Comment: Glucose reference range applies only to samples taken after fasting for at least 8 hours.  Lactic acid, plasma     Status: Abnormal   Collection Time: 12/09/22  5:31 AM  Result Value Ref Range   Lactic Acid, Venous 2.6 (HH) 0.5 - 1.9 mmol/L    Comment: CRITICAL RESULT CALLED TO, READ BACK BY AND VERIFIED WITH OSORIO, B. RN AT [phone] ON 12/09/2022 BY MECIAL J. Performed at [facility], [place] [address] [place], [place] [address]   Troponin I (High Sensitivity)     Status: Abnormal   Collection Time: 12/09/22  5:31 AM  Result Value Ref Range   Troponin I (High Sensitivity) 250 (HH) <18 ng/L    Comment: CRITICAL RESULT CALLED TO, READ BACK BY AND VERIFIED WITH OSORIO, B. RN AT [phone] ON 12/09/2022 BY MECIAL J. (NOTE) Elevated high sensitivity troponin I (hsTnI) values and significant  changes across serial measurements may suggest ACS but many other  chronic and acute conditions are known to elevate hsTnI results.  Refer to the "Links" section for chest pain algorithms and additional  guidance. Performed at [facility], [place] [address], [place], [place] [address]   CBG monitoring, ED     Status: Abnormal   Collection Time: 12/09/22  7:58 AM  Result Value Ref Range   Glucose-Capillary 178 (H) 70 - 99 mg/dL    Comment: Glucose reference range applies only to samples taken after fasting for at least 8 hours.  Lactic acid, plasma     Status: Abnormal   Collection Time: 12/09/22  7:59 AM  Result Value Ref Range   Lactic Acid, Venous 2.8 (HH) 0.5 - 1.9 mmol/L    Comment: CRITICAL RESULT CALLED TO, READ BACK BY AND VERIFIED WITH Rico Junker. RN AT [phone] ON 12/09/2022 BY MECIAL J. Performed at [facility], [place] [address], [place], [place] [address]   CBG monitoring, ED     Status: Abnormal   Collection Time: 12/09/22 10:46  AM  Result Value Ref Range   Glucose-Capillary  264 (H) 70 - 99 mg/dL    Comment: Glucose reference range applies only to samples taken after fasting for at least 8 hours.   CT Angio Chest PE W and/or Wo Contrast  Result Date: 12/08/2022 CLINICAL DATA:  Pulmonary embolus suspected with high probability. Worsening shortness of breath. EXAM: CT ANGIOGRAPHY CHEST WITH CONTRAST TECHNIQUE: Multidetector CT imaging of the chest was performed using the standard protocol during bolus administration of intravenous contrast. Multiplanar CT image reconstructions and MIPs were obtained to evaluate the vascular anatomy. RADIATION DOSE REDUCTION: This exam was performed according to the departmental dose-optimization program which includes automated exposure control, adjustment of the mA and/or kV according to patient size and/or use of iterative reconstruction technique. CONTRAST:  122m OMNIPAQUE IOHEXOL 350 MG/ML SOLN COMPARISON:  Chest radiograph 12/08/2022. Cardiac CT 06/18/2021. CT angio chest 09/19/2020 FINDINGS: Cardiovascular: There is good opacification of the central and segmental pulmonary arteries representing technically adequate study. No focal filling defects are identified. No evidence of significant pulmonary embolus. Normal heart size. No pericardial effusions. Normal caliber thoracic aorta. No evidence of aortic dissection. Calcification of the aorta and coronary arteries. Mediastinum/Nodes: Esophagus is decompressed. No significant lymphadenopathy. Thyroid gland is unremarkable. Lungs/Pleura: Emphysematous changes throughout the lungs with scarring in the lung apices. Focal area of patchy infiltration demonstrated in the left lingula and lower lung which could indicate early pneumonia. Right lung is clear. No pleural effusions. No pneumothorax. Upper Abdomen: Diffuse fatty infiltration of the liver. No acute process demonstrated in the upper abdomen. Musculoskeletal: Degenerative changes in the spine.  Review of the MIP images confirms the above findings. IMPRESSION: 1. No evidence of significant pulmonary embolus. 2. Focal area of patchy infiltration demonstrated in the left lower lung and lingula likely representing an area of pneumonia. 3. Aortic atherosclerosis. 4. Emphysematous changes in the lungs. 5. Fatty infiltration of the liver. Electronically Signed   By: WLucienne CapersM.D.   On: 12/08/2022 22:32   DG Chest 2 View  Result Date: 12/08/2022 CLINICAL DATA:  Dyspnea EXAM: CHEST - 2 VIEW COMPARISON:  12/06/2022 FINDINGS: The lungs are mildly hyperinflated paucity of vasculature within the lung apices in keeping with changes of emphysema. No superimposed confluent pulmonary infiltrate. No pneumothorax or pleural effusion. Cardiac size within normal limits. Pulmonary vascularity is normal. No acute bone abnormality. IMPRESSION: 1. No active cardiopulmonary disease. 2. Emphysema. Electronically Signed   By: AFidela SalisburyM.D.   On: 12/08/2022 20:53    Pending Labs Unresulted Labs (From admission, onward)     Start     Ordered   12/09/22 1245  Comprehensive metabolic panel  Once,   R        12/09/22 1245   12/09/22 1245  Magnesium  Once,   R        12/09/22 1245   12/09/22 1245  Procalcitonin  Once,   R        12/09/22 1245   12/09/22 1245  Phosphorus  Once,   R        12/09/22 1245   12/09/22 0500  Procalcitonin  Daily,   R      12/08/22 2345   12/08/22 2345  Legionella Pneumophila Serogp 1 Ur Ag  Once,   R        12/08/22 2345   12/08/22 2345  Expectorated Sputum Assessment w Gram Stain, Rflx to Resp Cult  Once,   R        12/08/22 2345   12/08/22 2335  Urinalysis,  Complete w Microscopic -Urine, Clean Catch  Once,   R       Question Answer Comment  Release to patient Immediate   Specimen Source Urine, Clean Catch      12/08/22 2335            Vitals/Pain Today's Vitals   12/09/22 0924 12/09/22 1100 12/09/22 1315 12/09/22 1415  BP: 133/78 129/70 (!) 150/82   Pulse:  74 70 74 84  Resp: (!) 22 16 19 18  $ Temp: 97.7 F (36.5 C)  97.9 F (36.6 C)   TempSrc: Oral  Oral   SpO2: 93% 94% 94% 93%  PainSc:        Isolation Precautions Droplet precaution  Medications Medications  levothyroxine (SYNTHROID) tablet 75 mcg (75 mcg Oral Given 12/09/22 0527)  acetaminophen (TYLENOL) tablet 650 mg (has no administration in time range)    Or  acetaminophen (TYLENOL) suppository 650 mg (has no administration in time range)  HYDROcodone-acetaminophen (NORCO/VICODIN) 5-325 MG per tablet 1-2 tablet (has no administration in time range)  methylPREDNISolone sodium succinate (SOLU-MEDROL) 40 mg/mL injection 40 mg (40 mg Intravenous Given 12/09/22 0528)    Followed by  predniSONE (DELTASONE) tablet 40 mg (has no administration in time range)  sodium chloride flush (NS) 0.9 % injection 3 mL (3 mLs Intravenous Not Given 12/09/22 0815)  sodium chloride flush (NS) 0.9 % injection 3 mL (has no administration in time range)  0.9 %  sodium chloride infusion (has no administration in time range)  cefTRIAXone (ROCEPHIN) 2 g in sodium chloride 0.9 % 100 mL IVPB (has no administration in time range)  azithromycin (ZITHROMAX) 500 mg in sodium chloride 0.9 % 250 mL IVPB (has no administration in time range)  0.9 %  sodium chloride infusion (0 mLs Intravenous Stopped 12/09/22 0607)  umeclidinium-vilanterol (ANORO ELLIPTA) 62.5-25 MCG/ACT 1 puff (1 puff Inhalation Not Given 12/09/22 0820)  albuterol (PROVENTIL) (2.5 MG/3ML) 0.083% nebulizer solution 2.5 mg (2.5 mg Nebulization Given 12/09/22 1357)  insulin aspart (novoLOG) injection 0-9 Units (5 Units Subcutaneous Given 12/09/22 1223)  ipratropium-albuterol (DUONEB) 0.5-2.5 (3) MG/3ML nebulizer solution 3 mL (3 mLs Nebulization Given 12/08/22 2148)  methylPREDNISolone sodium succinate (SOLU-MEDROL) 125 mg/2 mL injection 125 mg (125 mg Intravenous Given 12/08/22 2147)  magnesium sulfate IVPB 2 g 50 mL (0 g Intravenous Stopped 12/08/22 2315)   iohexol (OMNIPAQUE) 350 MG/ML injection 100 mL (100 mLs Intravenous Contrast Given 12/08/22 2217)  cefTRIAXone (ROCEPHIN) 1 g in sodium chloride 0.9 % 100 mL IVPB (0 g Intravenous Stopped 12/09/22 0058)  azithromycin (ZITHROMAX) 500 mg in sodium chloride 0.9 % 250 mL IVPB (0 mg Intravenous Stopped 12/09/22 0200)  sodium chloride 0.9 % bolus 500 mL (0 mLs Intravenous Stopped 12/09/22 0713)    Mobility walks     Focused Assessments Pulmonary Assessment Handoff:  Lung sounds: Bilateral Breath Sounds: Expiratory wheezes O2 Device: Nasal Cannula O2 Flow Rate (L/min): 2 L/min    R Recommendations: See Admitting Provider Note  Report given to:   Additional Notes: n/a

## 2022-12-09 NOTE — Progress Notes (Signed)
[facility] Progress Note  Patient: Stacy [name]     [id number]  DOA: 12/08/2022   PCP: [name], DO       Brief hospital course: Stacy [name], a 67 year old woman with a medical history significant for COPD requiring chronic oxygen therapy, hypertension, hyperlipidemia, type 2 diabetes, and hypothyroidism, presented with worsening shortness of breath.  She had visited her primary care physician two weeks ago complaining of cough, wheezing, shortness of breath, and yellow sputum, consistent with her known history of COPD. Although she had oxygen at home, she usually used it on an as-needed basis. However, recently she found herself needing continuous oxygen at a flow rate of 2 to 3 liters per minute to maintain oxygen saturation levels in the 90s. She sought care at urgent care where she was initiated on Levaquin and prednisone. Subsequently, she had to be taken to the ER on the 13th due to a COPD exacerbation. At that time, a chest x-ray revealed mild bibasilar opacities, thought to be atelectasis. The respiratory panel was negative. She received IV steroids and DuoNeb treatment and was discharged home but has since worsened. Although she has a pulmonologist, she did not consult them prior to this visit.  The patient became severely short of breath to the point where speaking became difficult. She also reported a history of rusty-colored sputum. Her oxygen levels dropped to 70 at home. Upon arrival at the ER, she complained of chest pain. It is noted that she no longer smokes and consumes alcohol rarely. She denied bright red sputum but reported chest pressure, describing it as feeling like she "pulled a rib while coughing"  She was found to have troponin of 579, LA of 2.1, WBC of 17.2.  COVID and Flu negative CT chest: Left sided infiltrates and emphysematous changes  Subjective:  She continues to have a cough and central chest pain when she coughs.   Assessment and  Plan:   CAP and COPD exacerbation with acute on chronic resp failure with lactic acisosis - Ceftriaxone and Azithromycin- pulm recommends stopping this tomorrow as long as patient is not getting worse - U strep neg - blood culture neg - lactic acid improving - Solumedrol> Prednisone tomorrow - back on 2 L today, RR in 20s - start Tussionex  Elevated troponin- type 2 MI - Trop max 579 and improving - ECHO shows normal EF and Grade 1 diastolic dysfunction - cardiology recommends CTA as outpatient  Anxiety/ insomnia - due to nebs and steroids - Ambien at bedtime  Elevated LFTs - mild- AST 53, ALT 50 - likely due to infection - recheck tomorrow  HTN - resume Metoprolol - hold Lasix  DM2 A1c 6.8 - not on meds at home - on SSI - add 3 U TID of Novolog       Code Status: Full Code Consultants: PCCM and Cardiology Level of Care: Level of care: Progressive Total time on patient care: 40 min DVT prophylaxis:  SCDs Start: 12/08/22 2345     Objective:   Vitals:   12/09/22 0924 12/09/22 1100 12/09/22 1315 12/09/22 1415  BP: 133/78 129/70 (!) 150/82 123/62  Pulse: 74 70 74 84  Resp: (!) 22 16 19 18  $ Temp: 97.7 F (36.5 C)  97.9 F (36.6 C)   TempSrc: Oral  Oral   SpO2: 93% 94% 94% 93%   There were no vitals filed for this visit. Exam: General exam: Appears comfortable  HEENT: oral mucosa moist Respiratory system: tachypnea,  cough, congestion Cardiovascular system: S1 & S2 heard  Gastrointestinal system: Abdomen soft, non-tender, nondistended. Normal bowel sounds   Extremities: No cyanosis, clubbing or edema Psychiatry:  Mood & affect appropriate.      CBC: Recent Labs  Lab 12/06/22 1614 12/08/22 2122 12/09/22 0026  WBC 10.0 17.2* 17.5*  NEUTROABS 8.8*  --   --   HGB 14.3 15.3* 15.2*  HCT 43.7 46.0 46.6*  MCV 97.1 96.8 97.7  PLT 285 296 [id number]   Basic Metabolic Panel: Recent Labs  Lab 12/06/22 1614 12/08/22 2122 12/08/22 2332  NA 137 137  --   K  3.9 3.8  --   CL 103 99  --   CO2 25 24  --   GLUCOSE 251* 238*  --   BUN 8 20  --   CREATININE 0.85 1.01*  --   CALCIUM 8.8* 9.2  --   MG  --   --  3.7*  PHOS  --   --  2.8   GFR: Estimated Creatinine Clearance: 52.7 mL/min (A) (by C-G formula based on SCr of 1.01 mg/dL (H)).  Scheduled Meds:  insulin aspart  0-9 Units Subcutaneous TID WC   levothyroxine  75 mcg Oral Daily   methylPREDNISolone (SOLU-MEDROL) injection  40 mg Intravenous Q12H   Followed by   Derrill Memo ON 12/10/2022] predniSONE  40 mg Oral Q breakfast   sodium chloride flush  3 mL Intravenous Q12H   umeclidinium-vilanterol  1 puff Inhalation Daily   Continuous Infusions:  sodium chloride     sodium chloride Stopped (12/09/22 0607)   azithromycin     cefTRIAXone (ROCEPHIN)  IV     Imaging and lab data was personally reviewed ECHOCARDIOGRAM COMPLETE  Result Date: 12/09/2022    ECHOCARDIOGRAM REPORT   Patient Name:   Stacy [name] Date of Exam: 12/09/2022 Medical Rec #:  [id number]     Height:       63.5 in Accession #:    [id number]    Weight:       149.5 lb Date of Birth:  1956-06-21     BSA:          1.719 m Patient Age:    47 years      BP:           123/62 mmHg Patient Gender: F             HR:           73 bpm. Exam Location:  Inpatient Procedure: 2D Echo Indications:    elevated troponin  History:        Patient has prior history of Echocardiogram examinations, most                 recent 08/12/2022. COPD; Risk Factors:Hypertension and                 Dyslipidemia.  Sonographer:    Harvie Junior Referring Phys: [id number] ANASTASSIA DOUTOVA  Sonographer Comments: Technically difficult study due to poor echo windows and suboptimal parasternal window. Image acquisition challenging due to COPD. IMPRESSIONS  1. Left ventricular ejection fraction, by estimation, is 65 to 70%. The left ventricle has normal function. The left ventricle has no regional wall motion abnormalities. Left ventricular diastolic parameters are consistent with  Grade I diastolic dysfunction (impaired relaxation).  2. Right ventricular systolic function is normal. The right ventricular size is normal.  3. The mitral valve is normal in structure. No evidence of mitral valve regurgitation.  No evidence of mitral stenosis.  4. The aortic valve is normal in structure. Aortic valve regurgitation is not visualized. No aortic stenosis is present.  5. The inferior vena cava is normal in size with greater than 50% respiratory variability, suggesting right atrial pressure of 3 mmHg. Comparison(s): No significant change from prior study. Prior images reviewed side by side. FINDINGS  Left Ventricle: Left ventricular ejection fraction, by estimation, is 65 to 70%. The left ventricle has normal function. The left ventricle has no regional wall motion abnormalities. The left ventricular internal cavity size was normal in size. There is  no left ventricular hypertrophy. Left ventricular diastolic parameters are consistent with Grade I diastolic dysfunction (impaired relaxation). Right Ventricle: The right ventricular size is normal. No increase in right ventricular wall thickness. Right ventricular systolic function is normal. Left Atrium: Left atrial size was normal in size. Right Atrium: Right atrial size was normal in size. Pericardium: There is no evidence of pericardial effusion. Mitral Valve: The mitral valve is normal in structure. No evidence of mitral valve regurgitation. No evidence of mitral valve stenosis. Tricuspid Valve: The tricuspid valve is normal in structure. Tricuspid valve regurgitation is not demonstrated. No evidence of tricuspid stenosis. Aortic Valve: The aortic valve is normal in structure. Aortic valve regurgitation is not visualized. No aortic stenosis is present. Aortic valve mean gradient measures 4.0 mmHg. Aortic valve peak gradient measures 6.7 mmHg. Aortic valve area, by VTI measures 2.15 cm. Pulmonic Valve: The pulmonic valve was normal in structure.  Pulmonic valve regurgitation is not visualized. No evidence of pulmonic stenosis. Aorta: The aortic root is normal in size and structure. Venous: The inferior vena cava is normal in size with greater than 50% respiratory variability, suggesting right atrial pressure of 3 mmHg. IAS/Shunts: No atrial level shunt detected by color flow Doppler.  LEFT VENTRICLE PLAX 2D LVIDd:         3.90 cm     Diastology LVIDs:         2.80 cm     LV e' medial:    7.67 cm/s LV PW:         0.90 cm     LV E/e' medial:  9.4 LV IVS:        0.80 cm     LV e' lateral:   11.40 cm/s LVOT diam:     1.90 cm     LV E/e' lateral: 6.3 LV SV:         55 LV SV Index:   32 LVOT Area:     2.84 cm  LV Volumes (MOD) LV vol d, MOD A2C: 67.3 ml LV vol d, MOD A4C: 86.6 ml LV vol s, MOD A2C: 25.1 ml LV vol s, MOD A4C: 36.5 ml LV SV MOD A2C:     42.2 ml LV SV MOD A4C:     86.6 ml LV SV MOD BP:      49.0 ml RIGHT VENTRICLE RV Basal diam:  2.90 cm RV Mid diam:    2.20 cm RV S prime:     11.95 cm/s TAPSE (M-mode): 1.7 cm LEFT ATRIUM             Index        RIGHT ATRIUM           Index LA Vol (A2C):   31.1 ml 18.09 ml/m  RA Area:     10.80 cm LA Vol (A4C):   30.0 ml 17.45 ml/m  RA Volume:  25.00 ml  14.55 ml/m LA Biplane Vol: 33.4 ml 19.43 ml/m  AORTIC VALVE AV Area (Vmax):    2.48 cm AV Area (Vmean):   2.25 cm AV Area (VTI):     2.15 cm AV Vmax:           129.00 cm/s AV Vmean:          91.100 cm/s AV VTI:            0.255 m AV Peak Grad:      6.7 mmHg AV Mean Grad:      4.0 mmHg LVOT Vmax:         113.00 cm/s LVOT Vmean:        72.200 cm/s LVOT VTI:          0.193 m LVOT/AV VTI ratio: 0.76  AORTA Ao Root diam: 3.10 cm MITRAL VALVE MV Area (PHT): 3.60 cm    SHUNTS MV Decel Time: 211 msec    Systemic VTI:  0.19 m MV E velocity: 72.00 cm/s  Systemic Diam: 1.90 cm MV A velocity: 85.70 cm/s MV E/A ratio:  0.84 Candee Furbish MD Electronically signed by Candee Furbish MD Signature Date/Time: 12/09/2022/3:30:09 PM    Final    CT Angio Chest PE W and/or Wo  Contrast  Result Date: 12/08/2022 CLINICAL DATA:  Pulmonary embolus suspected with high probability. Worsening shortness of breath. EXAM: CT ANGIOGRAPHY CHEST WITH CONTRAST TECHNIQUE: Multidetector CT imaging of the chest was performed using the standard protocol during bolus administration of intravenous contrast. Multiplanar CT image reconstructions and MIPs were obtained to evaluate the vascular anatomy. RADIATION DOSE REDUCTION: This exam was performed according to the departmental dose-optimization program which includes automated exposure control, adjustment of the mA and/or kV according to patient size and/or use of iterative reconstruction technique. CONTRAST:  172m OMNIPAQUE IOHEXOL 350 MG/ML SOLN COMPARISON:  Chest radiograph 12/08/2022. Cardiac CT 06/18/2021. CT angio chest 09/19/2020 FINDINGS: Cardiovascular: There is good opacification of the central and segmental pulmonary arteries representing technically adequate study. No focal filling defects are identified. No evidence of significant pulmonary embolus. Normal heart size. No pericardial effusions. Normal caliber thoracic aorta. No evidence of aortic dissection. Calcification of the aorta and coronary arteries. Mediastinum/Nodes: Esophagus is decompressed. No significant lymphadenopathy. Thyroid gland is unremarkable. Lungs/Pleura: Emphysematous changes throughout the lungs with scarring in the lung apices. Focal area of patchy infiltration demonstrated in the left lingula and lower lung which could indicate early pneumonia. Right lung is clear. No pleural effusions. No pneumothorax. Upper Abdomen: Diffuse fatty infiltration of the liver. No acute process demonstrated in the upper abdomen. Musculoskeletal: Degenerative changes in the spine. Review of the MIP images confirms the above findings. IMPRESSION: 1. No evidence of significant pulmonary embolus. 2. Focal area of patchy infiltration demonstrated in the left lower lung and lingula likely  representing an area of pneumonia. 3. Aortic atherosclerosis. 4. Emphysematous changes in the lungs. 5. Fatty infiltration of the liver. Electronically Signed   By: WLucienne CapersM.D.   On: 12/08/2022 22:32   DG Chest 2 View  Result Date: 12/08/2022 CLINICAL DATA:  Dyspnea EXAM: CHEST - 2 VIEW COMPARISON:  12/06/2022 FINDINGS: The lungs are mildly hyperinflated paucity of vasculature within the lung apices in keeping with changes of emphysema. No superimposed confluent pulmonary infiltrate. No pneumothorax or pleural effusion. Cardiac size within normal limits. Pulmonary vascularity is normal. No acute bone abnormality. IMPRESSION: 1. No active cardiopulmonary disease. 2. Emphysema. Electronically Signed   By: AFidela Salisbury  M.D.   On: 12/08/2022 20:53    LOS: 1 day   Author: Debbe Odea  12/09/2022 3:44 PM  To contact [facility]>   Check the care team in [facility] and look for the attending/consulting [facility] provider listed  Log into [email or web address] and use [facility]'s universal password   Go to> "[facility]"  and find provider  If you still have difficulty reaching the provider, please page the [facility] (Director on Call) for the Hospitalists listed on amion

## 2022-12-09 NOTE — ED Notes (Signed)
Pt does not wish to wear hospital gown until arrival to [facility].

## 2022-12-09 NOTE — Assessment & Plan Note (Signed)
Rehydrate and recheck Suspect in the setting of hypoxia and increased work of breathing  States clinically doing better

## 2022-12-10 DIAGNOSIS — J189 Pneumonia, unspecified organism: Secondary | ICD-10-CM | POA: Diagnosis not present

## 2022-12-10 LAB — COMPREHENSIVE METABOLIC PANEL
ALT: 37 U/L (ref 0–44)
AST: 33 U/L (ref 15–41)
Albumin: 3.4 g/dL — ABNORMAL LOW (ref 3.5–5.0)
Alkaline Phosphatase: 66 U/L (ref 38–126)
Anion gap: 7 (ref 5–15)
BUN: 16 mg/dL (ref 8–23)
CO2: 26 mmol/L (ref 22–32)
Calcium: 8.4 mg/dL — ABNORMAL LOW (ref 8.9–10.3)
Chloride: 105 mmol/L (ref 98–111)
Creatinine, Ser: 0.85 mg/dL (ref 0.44–1.00)
GFR, Estimated: >60 mL/min (ref >60)
Glucose, Bld: 171 mg/dL — ABNORMAL HIGH (ref 70–99)
Potassium: 4.8 mmol/L (ref 3.5–5.1)
Sodium: 138 mmol/L (ref 135–145)
Total Bilirubin: 0.6 mg/dL (ref 0.3–1.2)
Total Protein: 5.7 g/dL — ABNORMAL LOW (ref 6.5–8.1)

## 2022-12-10 LAB — GLUCOSE, CAPILLARY
Glucose-Capillary: 130 mg/dL — ABNORMAL HIGH (ref 70–99)
Glucose-Capillary: 87 mg/dL (ref 70–99)
Glucose-Capillary: 206 mg/dL — ABNORMAL HIGH (ref 70–99)
Glucose-Capillary: 132 mg/dL — ABNORMAL HIGH (ref 70–99)

## 2022-12-10 MED ORDER — SODIUM BICARBONATE 650 MG PO TABS
1300.0000 mg | ORAL_TABLET | Freq: Every day | ORAL | Status: DC
  Administered 2022-12-10: 1300 mg via ORAL
  Filled 2022-12-10: qty 2

## 2022-12-10 MED ORDER — PANTOPRAZOLE SODIUM 40 MG PO TBEC
40.0000 mg | DELAYED_RELEASE_TABLET | Freq: Every day | ORAL | Status: DC
  Administered 2022-12-10: 40 mg via ORAL
  Filled 2022-12-10: qty 1

## 2022-12-10 NOTE — Plan of Care (Signed)
  Problem: Activity: Goal: Ability to tolerate increased activity will improve Outcome: Progressing   Problem: Respiratory: Goal: Ability to maintain a clear airway will improve Outcome: Progressing   Problem: Education: Goal: Knowledge of General Education information will improve Description: Including pain rating scale, medication(s)/side effects and non-pharmacologic comfort measures Outcome: Progressing   Problem: Activity: Goal: Risk for activity intolerance will decrease Outcome: Progressing   Problem: Coping: Goal: Level of anxiety will decrease Outcome: Progressing   Problem: Elimination: Goal: Will not experience complications related to urinary retention Outcome: Progressing   Problem: Pain Managment: Goal: General experience of comfort will improve Outcome: Progressing   Problem: Safety: Goal: Ability to remain free from injury will improve Outcome: Progressing   Problem: Skin Integrity: Goal: Risk for impaired skin integrity will decrease Outcome: Progressing

## 2022-12-10 NOTE — Progress Notes (Signed)
[facility] Progress Note  Patient: Stacy [name]     [id number]  DOA: 12/08/2022   PCP: Stacy Norlander, DO       Brief hospital course: Stacy [name], a 67 year old woman with a medical history significant for COPD requiring chronic oxygen therapy, hypertension, hyperlipidemia, type 2 diabetes, and hypothyroidism, presented with worsening shortness of breath.  She had visited her primary care physician two weeks ago complaining of cough, wheezing, shortness of breath, and yellow sputum, consistent with her known history of COPD. Although she had oxygen at home, she usually used it on an as-needed basis. However, recently she found herself needing continuous oxygen at a flow rate of 2 to 3 liters per minute to maintain oxygen saturation levels in the 90s. She sought care at urgent care where she was initiated on Levaquin and prednisone. Subsequently, she had to be taken to the ER on the 13th due to a COPD exacerbation. At that time, a chest x-ray revealed mild bibasilar opacities, thought to be atelectasis. The respiratory panel was negative. She received IV steroids and DuoNeb treatment and was discharged home but has since worsened. Although she has a pulmonologist, she did not consult them prior to this visit.  The patient became severely short of breath to the point where speaking became difficult. She also reported a history of rusty-colored sputum. Her oxygen levels dropped to 70 at home. Upon arrival at the ER, she complained of chest pain. It is noted that she no longer smokes and consumes alcohol rarely. She denied bright red sputum but reported chest pressure, describing it as feeling like she "pulled a rib while coughing"  She was found to have troponin of 579, LA of 2.1, WBC of 17.2.  COVID and Flu negative CT chest: Left sided infiltrates and emphysematous changes  Subjective:  Has cough with congested chest and more sputum production today. She still has chest pain  when she coughs. Appetite is improving.   Assessment and Plan:   CAP and COPD exacerbation with acute resp failure with lactic acisosis - Ceftriaxone and Azithromycin started- procal neg - stop CTX today - U strep neg - blood culture neg - lactic acid improving - Solumedrol> Prednisone today - still very symptomatic - cont Tussionex- encourage increased ambulation if possible  Poor oral intake - improved- stop iVF  Elevated troponin- type 2 MI - Trop max 579 and improving - ECHO shows normal EF and Grade 1 diastolic dysfunction - cardiology recommends CTA as outpatient  Anxiety/ insomnia - due to nebs and steroids - Ambien at bedtime  Elevated LFTs - mild- AST 53, ALT 50 - likely due to infection - resolved  HTN - resume Metoprolol - hold Lasix  DM2 A1c 6.8 - not on meds at home - on SSI - added 3 U TID of Novolog which is helping       Code Status: Full Code Consultants: PCCM and Cardiology Level of Care: Level of care: Progressive Total time on patient care: 30 min DVT prophylaxis:  SCDs Start: 12/08/22 2345     Objective:   Vitals:   12/10/22 0004 12/10/22 0609 12/10/22 0753 12/10/22 1014  BP: 127/66 129/77  126/64  Pulse: 77 70  65  Resp: 18 18 18   $ Temp: 98 F (36.7 C) 98 F (36.7 C)    TempSrc: Oral Oral    SpO2: 94% 96% 96%   Weight:      Height:       Filed  Weights   12/09/22 1632  Weight: 67.8 kg   Exam: General exam: Appears comfortable  HEENT: oral mucosa moist Respiratory system: Wheezing Cardiovascular system: S1 & S2 heard  Gastrointestinal system: Abdomen soft, non-tender, nondistended. Normal bowel sounds   Extremities: No cyanosis, clubbing or edema Psychiatry:  Mood & affect appropriate.    CBC: Recent Labs  Lab 12/06/22 1614 12/08/22 2122 12/09/22 0026  WBC 10.0 17.2* 17.5*  NEUTROABS 8.8*  --   --   HGB 14.3 15.3* 15.2*  HCT 43.7 46.0 46.6*  MCV 97.1 96.8 97.7  PLT 285 296 [id number]    Basic Metabolic  Panel: Recent Labs  Lab 12/06/22 1614 12/08/22 2122 12/08/22 2332 12/09/22 1646 12/10/22 0443  NA 137 137  --  140 138  K 3.9 3.8  --  4.1 4.8  CL 103 99  --  104 105  CO2 25 24  --  26 26  GLUCOSE 251* 238*  --  174* 171*  BUN 8 20  --  12 16  CREATININE 0.85 1.01*  --  0.81 0.85  CALCIUM 8.8* 9.2  --  9.0 8.4*  MG  --   --  3.7* 2.8*  --   PHOS  --   --  2.8 3.2  --     GFR: Estimated Creatinine Clearance: 61.9 mL/min (by C-G formula based on SCr of 0.85 mg/dL).  Scheduled Meds:  chlorpheniramine-HYDROcodone  5 mL Oral Q12H   FLUoxetine  20 mg Oral Daily   insulin aspart  0-9 Units Subcutaneous TID WC   insulin aspart  3 Units Subcutaneous TID WC   levothyroxine  75 mcg Oral Daily   metoprolol succinate  25 mg Oral Daily   pantoprazole  40 mg Oral QHS   predniSONE  40 mg Oral Q breakfast   sodium bicarbonate  1,300 mg Oral QHS   sodium chloride flush  3 mL Intravenous Q12H   umeclidinium-vilanterol  1 puff Inhalation Daily   zolpidem  5 mg Oral QHS   Continuous Infusions:  sodium chloride     azithromycin 500 mg (12/10/22 0047)   Imaging and lab data was personally reviewed ECHOCARDIOGRAM COMPLETE  Result Date: 12/09/2022    ECHOCARDIOGRAM REPORT   Patient Name:   Stacy [name] Date of Exam: 12/09/2022 Medical Rec #:  [id number]     Height:       63.5 in Accession #:    [id number]    Weight:       149.5 lb Date of Birth:  01/29/1956     BSA:          1.719 m Patient Age:    38 years      BP:           123/62 mmHg Patient Gender: F             HR:           73 bpm. Exam Location:  Inpatient Procedure: 2D Echo Indications:    elevated troponin  History:        Patient has prior history of Echocardiogram examinations, most                 recent 08/12/2022. COPD; Risk Factors:Hypertension and                 Dyslipidemia.  Sonographer:    Stacy [name] Referring Phys: [id number] Stacy [name]  Sonographer Comments: Technically difficult study due to poor echo windows and  suboptimal  parasternal window. Image acquisition challenging due to COPD. IMPRESSIONS  1. Left ventricular ejection fraction, by estimation, is 65 to 70%. The left ventricle has normal function. The left ventricle has no regional wall motion abnormalities. Left ventricular diastolic parameters are consistent with Grade I diastolic dysfunction (impaired relaxation).  2. Right ventricular systolic function is normal. The right ventricular size is normal.  3. The mitral valve is normal in structure. No evidence of mitral valve regurgitation. No evidence of mitral stenosis.  4. The aortic valve is normal in structure. Aortic valve regurgitation is not visualized. No aortic stenosis is present.  5. The inferior vena cava is normal in size with greater than 50% respiratory variability, suggesting right atrial pressure of 3 mmHg. Comparison(s): No significant change from prior study. Prior images reviewed side by side. FINDINGS  Left Ventricle: Left ventricular ejection fraction, by estimation, is 65 to 70%. The left ventricle has normal function. The left ventricle has no regional wall motion abnormalities. The left ventricular internal cavity size was normal in size. There is  no left ventricular hypertrophy. Left ventricular diastolic parameters are consistent with Grade I diastolic dysfunction (impaired relaxation). Right Ventricle: The right ventricular size is normal. No increase in right ventricular wall thickness. Right ventricular systolic function is normal. Left Atrium: Left atrial size was normal in size. Right Atrium: Right atrial size was normal in size. Pericardium: There is no evidence of pericardial effusion. Mitral Valve: The mitral valve is normal in structure. No evidence of mitral valve regurgitation. No evidence of mitral valve stenosis. Tricuspid Valve: The tricuspid valve is normal in structure. Tricuspid valve regurgitation is not demonstrated. No evidence of tricuspid stenosis. Aortic Valve: The  aortic valve is normal in structure. Aortic valve regurgitation is not visualized. No aortic stenosis is present. Aortic valve mean gradient measures 4.0 mmHg. Aortic valve peak gradient measures 6.7 mmHg. Aortic valve area, by VTI measures 2.15 cm. Pulmonic Valve: The pulmonic valve was normal in structure. Pulmonic valve regurgitation is not visualized. No evidence of pulmonic stenosis. Aorta: The aortic root is normal in size and structure. Venous: The inferior vena cava is normal in size with greater than 50% respiratory variability, suggesting right atrial pressure of 3 mmHg. IAS/Shunts: No atrial level shunt detected by color flow Doppler.  LEFT VENTRICLE PLAX 2D LVIDd:         3.90 cm     Diastology LVIDs:         2.80 cm     LV e' medial:    7.67 cm/s LV PW:         0.90 cm     LV E/e' medial:  9.4 LV IVS:        0.80 cm     LV e' lateral:   11.40 cm/s LVOT diam:     1.90 cm     LV E/e' lateral: 6.3 LV SV:         55 LV SV Index:   32 LVOT Area:     2.84 cm  LV Volumes (MOD) LV vol d, MOD A2C: 67.3 ml LV vol d, MOD A4C: 86.6 ml LV vol s, MOD A2C: 25.1 ml LV vol s, MOD A4C: 36.5 ml LV SV MOD A2C:     42.2 ml LV SV MOD A4C:     86.6 ml LV SV MOD BP:      49.0 ml RIGHT VENTRICLE RV Basal diam:  2.90 cm RV Mid diam:    2.20 cm RV S  prime:     11.95 cm/s TAPSE (M-mode): 1.7 cm LEFT ATRIUM             Index        RIGHT ATRIUM           Index LA Vol (A2C):   31.1 ml 18.09 ml/m  RA Area:     10.80 cm LA Vol (A4C):   30.0 ml 17.45 ml/m  RA Volume:   25.00 ml  14.55 ml/m LA Biplane Vol: 33.4 ml 19.43 ml/m  AORTIC VALVE AV Area (Vmax):    2.48 cm AV Area (Vmean):   2.25 cm AV Area (VTI):     2.15 cm AV Vmax:           129.00 cm/s AV Vmean:          91.100 cm/s AV VTI:            0.255 m AV Peak Grad:      6.7 mmHg AV Mean Grad:      4.0 mmHg LVOT Vmax:         113.00 cm/s LVOT Vmean:        72.200 cm/s LVOT VTI:          0.193 m LVOT/AV VTI ratio: 0.76  AORTA Ao Root diam: 3.10 cm MITRAL VALVE MV Area (PHT):  3.60 cm    SHUNTS MV Decel Time: 211 msec    Systemic VTI:  0.19 m MV E velocity: 72.00 cm/s  Systemic Diam: 1.90 cm MV A velocity: 85.70 cm/s MV E/A ratio:  0.84 Candee Furbish MD Electronically signed by Candee Furbish MD Signature Date/Time: 12/09/2022/3:30:09 PM    Final    CT Angio Chest PE W and/or Wo Contrast  Result Date: 12/08/2022 CLINICAL DATA:  Pulmonary embolus suspected with high probability. Worsening shortness of breath. EXAM: CT ANGIOGRAPHY CHEST WITH CONTRAST TECHNIQUE: Multidetector CT imaging of the chest was performed using the standard protocol during bolus administration of intravenous contrast. Multiplanar CT image reconstructions and MIPs were obtained to evaluate the vascular anatomy. RADIATION DOSE REDUCTION: This exam was performed according to the departmental dose-optimization program which includes automated exposure control, adjustment of the mA and/or kV according to patient size and/or use of iterative reconstruction technique. CONTRAST:  186m OMNIPAQUE IOHEXOL 350 MG/ML SOLN COMPARISON:  Chest radiograph 12/08/2022. Cardiac CT 06/18/2021. CT angio chest 09/19/2020 FINDINGS: Cardiovascular: There is good opacification of the central and segmental pulmonary arteries representing technically adequate study. No focal filling defects are identified. No evidence of significant pulmonary embolus. Normal heart size. No pericardial effusions. Normal caliber thoracic aorta. No evidence of aortic dissection. Calcification of the aorta and coronary arteries. Mediastinum/Nodes: Esophagus is decompressed. No significant lymphadenopathy. Thyroid gland is unremarkable. Lungs/Pleura: Emphysematous changes throughout the lungs with scarring in the lung apices. Focal area of patchy infiltration demonstrated in the left lingula and lower lung which could indicate early pneumonia. Right lung is clear. No pleural effusions. No pneumothorax. Upper Abdomen: Diffuse fatty infiltration of the liver. No acute  process demonstrated in the upper abdomen. Musculoskeletal: Degenerative changes in the spine. Review of the MIP images confirms the above findings. IMPRESSION: 1. No evidence of significant pulmonary embolus. 2. Focal area of patchy infiltration demonstrated in the left lower lung and lingula likely representing an area of pneumonia. 3. Aortic atherosclerosis. 4. Emphysematous changes in the lungs. 5. Fatty infiltration of the liver. Electronically Signed   By: WLucienne CapersM.D.   On: 12/08/2022 22:32  DG Chest 2 View  Result Date: 12/08/2022 CLINICAL DATA:  Dyspnea EXAM: CHEST - 2 VIEW COMPARISON:  12/06/2022 FINDINGS: The lungs are mildly hyperinflated paucity of vasculature within the lung apices in keeping with changes of emphysema. No superimposed confluent pulmonary infiltrate. No pneumothorax or pleural effusion. Cardiac size within normal limits. Pulmonary vascularity is normal. No acute bone abnormality. IMPRESSION: 1. No active cardiopulmonary disease. 2. Emphysema. Electronically Signed   By: Fidela Salisbury M.D.   On: 12/08/2022 20:53    LOS: 2 days   Author: Debbe Odea  12/10/2022 4:45 PM  To contact [facility]>   Check the care team in [facility] and look for the attending/consulting [facility] provider listed  Log into [email or web address] and use [place]'s universal password   Go to> "[facility]"  and find provider  If you still have difficulty reaching the provider, please page the [facility] (Director on Call) for the Hospitalists listed on amion

## 2022-12-11 DIAGNOSIS — R7989 Other specified abnormal findings of blood chemistry: Secondary | ICD-10-CM

## 2022-12-11 DIAGNOSIS — J9601 Acute respiratory failure with hypoxia: Secondary | ICD-10-CM | POA: Diagnosis not present

## 2022-12-11 HISTORY — DX: Other specified abnormal findings of blood chemistry: R79.89

## 2022-12-11 LAB — LACTIC ACID, PLASMA: Lactic Acid, Venous: 1.5 mmol/L (ref 0.5–1.9)

## 2022-12-11 LAB — GLUCOSE, CAPILLARY: Glucose-Capillary: 106 mg/dL — ABNORMAL HIGH (ref 70–99)

## 2022-12-11 MED ORDER — ZOLPIDEM TARTRATE 5 MG PO TABS: 5.0000 mg | ORAL_TABLET | Freq: Every evening | ORAL | 0 refills | Status: AC | PRN

## 2022-12-11 MED ORDER — PREDNISONE 20 MG PO TABS: 40.0000 mg | ORAL_TABLET | ORAL | 0 refills | Status: AC

## 2022-12-11 MED ORDER — AZITHROMYCIN 250 MG PO TABS: 250.0000 mg | ORAL_TABLET | Freq: Every day | ORAL | 0 refills | Status: AC

## 2022-12-11 MED ORDER — GLIPIZIDE 5 MG PO TABS: 5.0000 mg | ORAL_TABLET | Freq: Two times a day (BID) | ORAL | 0 refills | Status: DC

## 2022-12-11 MED ORDER — HYDROCOD POLI-CHLORPHE POLI ER 10-8 MG/5ML PO SUER: 5.0000 mL | Freq: Two times a day (BID) | ORAL | 0 refills | Status: AC

## 2022-12-11 NOTE — Discharge Summary (Signed)
Physician Discharge Summary  Stacy [name] [id number] DOB: 10/24/1956 DOA: 12/08/2022  PCP: [name], DO  Admit date: 12/08/2022 Discharge date: 12/11/2022 Discharging to: home Recommendations for Outpatient Follow-up:  Please discuss complications of fatty liver and encourage weight loss She will need to f/u with cardiology for a CTA  Consults:  PCCM Cardiology  Procedures:  none   Discharge Diagnoses:   Principal Problem:   Acute hypoxemic respiratory failure ([place]) Active Problems:   COPD with acute exacerbation ([place])   CAP (community acquired pneumonia)   Type 2 MI (myocardial infarction) ([place])   Hypothyroidism   HYPERTENSION, BENIGN   Controlled diabetes mellitus type 2 with complications ([place])   Insomnia- related to menopause   Hyperlipidemia associated with type 2 diabetes mellitus (HCC)   Hemoptysis   Lactic acid increased   Elevated LFTs     Hospital Course:  Stacy [name], a 67 year old woman with a medical history significant for COPD requiring 2 L O2 for the past 2 wks, hypertension, hyperlipidemia, type 2 diabetes, and hypothyroidism, presented with worsening shortness of breath, cough, wheezing, and yellow sputum. Although she had oxygen at home, she usually used it on an as-needed basis. However, 2 wks ago she found herself needing continuous oxygen at a flow rate of 2 to 3 liters per minute to maintain oxygen saturation levels in the 90s. She sought care at urgent care where she was initiated on Levaquin and prednisone.   Feb 13th> had to be taken to the ER due to a COPD exacerbation. - chest x-ray revealed mild bibasilar opacities, thought to be atelectasis. The respiratory panel was negative. She received IV steroids and DuoNeb treatment and was discharged home but has since worsened.     2/15>  - The patient became severely short of breath to the point where speaking became difficult. She also reported a rusty-colored sputum.  -oxygen levels  dropped to 70 at home - Upon arrival at the ER, she complained of chest pain. It is noted that she no longer smokes - CXR > clear - CT chest >  Focal area of patchy infiltration demonstrated in the left lower lung and lingula likely representing an area of pneumonia.Marland Kitchen      She was found to have troponin of 579, LA of 2.1, WBC of 17.2.  COVID and Flu negative CT chest: Left sided infiltrates and emphysematous changes    Assessment and Plan:   CAP (likely viral) in left lung  COPD exacerbation   Lactic acidosis & hemoptysis  - I believe her hypoxic resp failure is acute and not chronic since she did not require O2 2 wks ago. I feel she has been having a COPD exacerbation for the past 2 wks - Ceftriaxone and Azithromycin started- procal neg - stopped CTX yesterday - Solumedrol, Tussionex and Nebs given - U strep neg - blood culture neg - lactic acid improving - cont Tussionex, Azithromycin and Prednisone and nebs at home for 4 more days  - pulse ox improved to 88% on room air on exertion today - she has a pulse ox and knows to use O2 if her pulse ox < 88%    Poor oral intake - improved   Elevated troponin- type 2 MI - Trop max 579 and improving - ECHO shows normal EF and Grade 1 diastolic dysfunction - cardiology recommends CTA as outpatient   Anxiety/ insomnia - due to nebs and steroids - Ambien at bedtime   Elevated LFTs - mild-  AST 53, ALT 50 - likely due to infection - resolved   HTN - resume Metoprolol - held Lasix in the hospital   DM2 A1c 6.8 - not on meds at home- A1c ~ 6 - added Glipizide temporarily for home while she is on Prednisone      Discharge Instructions  Discharge Instructions     Diet - low sodium heart healthy   Complete by: As directed    Diet Carb Modified   Complete by: As directed       Allergies as of 12/11/2022       Reactions   Avelox [moxifloxacin Hcl In Nacl] Hives   Ciprofloxacin    REACTION: Rash   Livalo [pitavastatin]     Metformin And Related    kidney   Moxifloxacin    REACTION: RASH   Bactrim [sulfamethoxazole-trimethoprim] Rash   Statins Other (See Comments)   myalgias   Sulfamethoxazole-trimethoprim Rash   REACTION: Rash        Medication List     STOP taking these medications    Levaquin 750 MG tablet Generic drug: levofloxacin       TAKE these medications    albuterol 108 (90 Base) MCG/ACT inhaler Commonly known as: VENTOLIN HFA Inhale 2 puffs into the lungs every 6 (six) hours as needed for wheezing or shortness of breath.   albuterol (2.5 MG/3ML) 0.083% nebulizer solution Commonly known as: PROVENTIL Take 3 mLs (2.5 mg total) by nebulization every 6 (six) hours as needed for wheezing or shortness of breath.   Anoro Ellipta 62.5-25 MCG/ACT Aepb Generic drug: umeclidinium-vilanterol Inhale 1 puff into the lungs daily.   azithromycin 250 MG tablet Commonly known as: Zithromax Take 1 tablet (250 mg total) by mouth daily for 3 days.   benzonatate 100 MG capsule Commonly known as: TESSALON Take 1 capsule (100 mg total) by mouth every 8 (eight) hours.   chlorpheniramine-HYDROcodone 10-8 MG/5ML Commonly known as: TUSSIONEX Take 5 mLs by mouth every 12 (twelve) hours for 30 doses.   FLUoxetine 20 MG capsule Commonly known as: PROZAC Take 20 mg by mouth daily.   furosemide 20 MG tablet Commonly known as: LASIX Take 20 mg by mouth daily.   glipiZIDE 5 MG tablet Commonly known as: GLUCOTROL Take 1 tablet (5 mg total) by mouth 2 (two) times daily for 10 days.   guaiFENesin 600 MG 12 hr tablet Commonly known as: MUCINEX Take 1,200 mg by mouth 2 (two) times daily as needed for cough.   levothyroxine 75 MCG tablet Commonly known as: SYNTHROID Take 1 tablet (75 mcg total) by mouth daily.   loratadine 10 MG tablet Commonly known as: CLARITIN Take 10 mg by mouth daily.   metoprolol succinate 25 MG 24 hr tablet Commonly known as: Toprol XL Take 1 tablet (25 mg total)  by mouth daily.   Omeprazole-Sodium Bicarbonate 20-1100 MG Caps capsule Commonly known as: ZEGERID Take 1 capsule by mouth at bedtime.   predniSONE 20 MG tablet Commonly known as: DELTASONE Take 2 tablets (40 mg total) by mouth as directed for 4 days. Dose Pack: Take 50 mg for 2 Days, Then Take 40 mg for 2 Days, Then Take 30 mg for 2 Days, Then Take 20 mg for 2 Days, Then Take 10 mg for 2 Days. What changed:  medication strength how much to take   Repatha 140 MG/ML Sosy Generic drug: Evolocumab Inject 140 mg into the skin every 14 (fourteen) days.   Vitamin D (Ergocalciferol) 1.25 MG (  50000 UNIT) Caps capsule Commonly known as: DRISDOL Take 1 capsule (50,000 Units total) by mouth every 7 (seven) days.   zolpidem 5 MG tablet Commonly known as: Ambien Take 1 tablet (5 mg total) by mouth at bedtime as needed for sleep.        Follow-up Information     Revankar, Reita Cliche, MD Follow up on 01/11/2023.   Specialty: Cardiology Why: at 1:20 pm  for follow up Contact information: [address]  [place] [address] [phone]                    The results of significant diagnostics from this hospitalization (including imaging, microbiology, ancillary and laboratory) are listed below for reference.    ECHOCARDIOGRAM COMPLETE  Result Date: 12/09/2022    ECHOCARDIOGRAM REPORT   Patient Name:   Stacy [name] Date of Exam: 12/09/2022 Medical Rec #:  [id number]     Height:       63.5 in Accession #:    [id number]    Weight:       149.5 lb Date of Birth:  1956/03/12     BSA:          1.719 m Patient Age:    81 years      BP:           123/62 mmHg Patient Gender: F             HR:           73 bpm. Exam Location:  Inpatient Procedure: 2D Echo Indications:    elevated troponin  History:        Patient has prior history of Echocardiogram examinations, most                 recent 08/12/2022. COPD; Risk Factors:Hypertension and                 Dyslipidemia.   Sonographer:    Harvie Junior Referring Phys: [id number] ANASTASSIA DOUTOVA  Sonographer Comments: Technically difficult study due to poor echo windows and suboptimal parasternal window. Image acquisition challenging due to COPD. IMPRESSIONS  1. Left ventricular ejection fraction, by estimation, is 65 to 70%. The left ventricle has normal function. The left ventricle has no regional wall motion abnormalities. Left ventricular diastolic parameters are consistent with Grade I diastolic dysfunction (impaired relaxation).  2. Right ventricular systolic function is normal. The right ventricular size is normal.  3. The mitral valve is normal in structure. No evidence of mitral valve regurgitation. No evidence of mitral stenosis.  4. The aortic valve is normal in structure. Aortic valve regurgitation is not visualized. No aortic stenosis is present.  5. The inferior vena cava is normal in size with greater than 50% respiratory variability, suggesting right atrial pressure of 3 mmHg. Comparison(s): No significant change from prior study. Prior images reviewed side by side. FINDINGS  Left Ventricle: Left ventricular ejection fraction, by estimation, is 65 to 70%. The left ventricle has normal function. The left ventricle has no regional wall motion abnormalities. The left ventricular internal cavity size was normal in size. There is  no left ventricular hypertrophy. Left ventricular diastolic parameters are consistent with Grade I diastolic dysfunction (impaired relaxation). Right Ventricle: The right ventricular size is normal. No increase in right ventricular wall thickness. Right ventricular systolic function is normal. Left Atrium: Left atrial size was normal in size. Right Atrium: Right atrial size was normal in size. Pericardium: There  is no evidence of pericardial effusion. Mitral Valve: The mitral valve is normal in structure. No evidence of mitral valve regurgitation. No evidence of mitral valve stenosis. Tricuspid Valve:  The tricuspid valve is normal in structure. Tricuspid valve regurgitation is not demonstrated. No evidence of tricuspid stenosis. Aortic Valve: The aortic valve is normal in structure. Aortic valve regurgitation is not visualized. No aortic stenosis is present. Aortic valve mean gradient measures 4.0 mmHg. Aortic valve peak gradient measures 6.7 mmHg. Aortic valve area, by VTI measures 2.15 cm. Pulmonic Valve: The pulmonic valve was normal in structure. Pulmonic valve regurgitation is not visualized. No evidence of pulmonic stenosis. Aorta: The aortic root is normal in size and structure. Venous: The inferior vena cava is normal in size with greater than 50% respiratory variability, suggesting right atrial pressure of 3 mmHg. IAS/Shunts: No atrial level shunt detected by color flow Doppler.  LEFT VENTRICLE PLAX 2D LVIDd:         3.90 cm     Diastology LVIDs:         2.80 cm     LV e' medial:    7.67 cm/s LV PW:         0.90 cm     LV E/e' medial:  9.4 LV IVS:        0.80 cm     LV e' lateral:   11.40 cm/s LVOT diam:     1.90 cm     LV E/e' lateral: 6.3 LV SV:         55 LV SV Index:   32 LVOT Area:     2.84 cm  LV Volumes (MOD) LV vol d, MOD A2C: 67.3 ml LV vol d, MOD A4C: 86.6 ml LV vol s, MOD A2C: 25.1 ml LV vol s, MOD A4C: 36.5 ml LV SV MOD A2C:     42.2 ml LV SV MOD A4C:     86.6 ml LV SV MOD BP:      49.0 ml RIGHT VENTRICLE RV Basal diam:  2.90 cm RV Mid diam:    2.20 cm RV S prime:     11.95 cm/s TAPSE (M-mode): 1.7 cm LEFT ATRIUM             Index        RIGHT ATRIUM           Index LA Vol (A2C):   31.1 ml 18.09 ml/m  RA Area:     10.80 cm LA Vol (A4C):   30.0 ml 17.45 ml/m  RA Volume:   25.00 ml  14.55 ml/m LA Biplane Vol: 33.4 ml 19.43 ml/m  AORTIC VALVE AV Area (Vmax):    2.48 cm AV Area (Vmean):   2.25 cm AV Area (VTI):     2.15 cm AV Vmax:           129.00 cm/s AV Vmean:          91.100 cm/s AV VTI:            0.255 m AV Peak Grad:      6.7 mmHg AV Mean Grad:      4.0 mmHg LVOT Vmax:          113.00 cm/s LVOT Vmean:        72.200 cm/s LVOT VTI:          0.193 m LVOT/AV VTI ratio: 0.76  AORTA Ao Root diam: 3.10 cm MITRAL VALVE MV Area (PHT): 3.60 cm    SHUNTS MV  Decel Time: 211 msec    Systemic VTI:  0.19 m MV E velocity: 72.00 cm/s  Systemic Diam: 1.90 cm MV A velocity: 85.70 cm/s MV E/A ratio:  0.84 Candee Furbish MD Electronically signed by Candee Furbish MD Signature Date/Time: 12/09/2022/3:30:09 PM    Final    CT Angio Chest PE W and/or Wo Contrast  Result Date: 12/08/2022 CLINICAL DATA:  Pulmonary embolus suspected with high probability. Worsening shortness of breath. EXAM: CT ANGIOGRAPHY CHEST WITH CONTRAST TECHNIQUE: Multidetector CT imaging of the chest was performed using the standard protocol during bolus administration of intravenous contrast. Multiplanar CT image reconstructions and MIPs were obtained to evaluate the vascular anatomy. RADIATION DOSE REDUCTION: This exam was performed according to the departmental dose-optimization program which includes automated exposure control, adjustment of the mA and/or kV according to patient size and/or use of iterative reconstruction technique. CONTRAST:  158m OMNIPAQUE IOHEXOL 350 MG/ML SOLN COMPARISON:  Chest radiograph 12/08/2022. Cardiac CT 06/18/2021. CT angio chest 09/19/2020 FINDINGS: Cardiovascular: There is good opacification of the central and segmental pulmonary arteries representing technically adequate study. No focal filling defects are identified. No evidence of significant pulmonary embolus. Normal heart size. No pericardial effusions. Normal caliber thoracic aorta. No evidence of aortic dissection. Calcification of the aorta and coronary arteries. Mediastinum/Nodes: Esophagus is decompressed. No significant lymphadenopathy. Thyroid gland is unremarkable. Lungs/Pleura: Emphysematous changes throughout the lungs with scarring in the lung apices. Focal area of patchy infiltration demonstrated in the left lingula and lower lung which could  indicate early pneumonia. Right lung is clear. No pleural effusions. No pneumothorax. Upper Abdomen: Diffuse fatty infiltration of the liver. No acute process demonstrated in the upper abdomen. Musculoskeletal: Degenerative changes in the spine. Review of the MIP images confirms the above findings. IMPRESSION: 1. No evidence of significant pulmonary embolus. 2. Focal area of patchy infiltration demonstrated in the left lower lung and lingula likely representing an area of pneumonia. 3. Aortic atherosclerosis. 4. Emphysematous changes in the lungs. 5. Fatty infiltration of the liver. Electronically Signed   By: WLucienne CapersM.D.   On: 12/08/2022 22:32   DG Chest 2 View  Result Date: 12/08/2022 CLINICAL DATA:  Dyspnea EXAM: CHEST - 2 VIEW COMPARISON:  12/06/2022 FINDINGS: The lungs are mildly hyperinflated paucity of vasculature within the lung apices in keeping with changes of emphysema. No superimposed confluent pulmonary infiltrate. No pneumothorax or pleural effusion. Cardiac size within normal limits. Pulmonary vascularity is normal. No acute bone abnormality. IMPRESSION: 1. No active cardiopulmonary disease. 2. Emphysema. Electronically Signed   By: AFidela SalisburyM.D.   On: 12/08/2022 20:53   DG Chest 2 View  Result Date: 12/06/2022 CLINICAL DATA:  Shortness of breath. EXAM: CHEST - 2 VIEW COMPARISON:  Chest x-ray October 16 23. FINDINGS: Mild streaky bibasilar opacities. Hyperinflation. No confluent consolidation. No visible pleural effusions or pneumothorax. Cardiomediastinal silhouette is within normal limits. Polyarticular degenerative change. Osteopenia. IMPRESSION: 1. Mild streaky bibasilar opacities, probably atelectasis. 2. Hyperinflation, suggestive of COPD/emphysema. Electronically Signed   By: FMargaretha SheffieldM.D.   On: 12/06/2022 16:56   Labs:   Basic Metabolic Panel: Recent Labs  Lab 12/06/22 1614 12/08/22 2122 12/08/22 2332 12/09/22 1646 12/10/22 0443  NA 137 137  --  140  138  K 3.9 3.8  --  4.1 4.8  CL 103 99  --  104 105  CO2 25 24  --  26 26  GLUCOSE 251* 238*  --  174* 171*  BUN 8 20  --  12 16  CREATININE 0.85 1.01*  --  0.81 0.85  CALCIUM 8.8* 9.2  --  9.0 8.4*  MG  --   --  3.7* 2.8*  --   PHOS  --   --  2.8 3.2  --      CBC: Recent Labs  Lab 12/06/22 1614 12/08/22 2122 12/09/22 0026  WBC 10.0 17.2* 17.5*  NEUTROABS 8.8*  --   --   HGB 14.3 15.3* 15.2*  HCT 43.7 46.0 46.6*  MCV 97.1 96.8 97.7  PLT 285 296 306         SIGNED:   Debbe Odea, MD  [facility] 12/11/2022, 4:23 PM

## 2022-12-11 NOTE — TOC Initial Note (Signed)
Transition of Care [facility]) - Initial/Assessment Note    Patient Details  Name: Stacy [name] MRN: [id number] Date of Birth: 1956-04-03  [facility]) CM/SW Contact:    Stacy Dine, RN Phone Number: 12/11/2022, 11:10 AM  Clinical Narrative:               CM consult for d/c planning; pt is from [facility] and she plans to return at d/c; she says she has transportation; pt denies IPV, food insecurity, and difficulty paying utilities; pt identified POC husband Stacy [name] [phone]); pt says she has glasses; she does not have HA or dentures; she says she does not have DME or [place] services; there are bars in her shower; pt says she has home oxygen w/ [employer]; she says she has a travel tank and it is full; notified at [facility] of pt d/c; no TOC needs.   Expected Discharge Plan: Home/Self Care Barriers to Discharge: No Barriers Identified   Patient Goals and CMS Choice Patient states their goals for this hospitalization and ongoing recovery are:: home          Expected Discharge Plan and Services   Discharge Planning Services: CM Consult Post Acute Care Choice: Resumption of Svcs/PTA Provider, Durable Medical Equipment (home oxygen w/ [employer]) Living arrangements for the past 2 months: Single Family Home Expected Discharge Date: 12/11/22                                    Prior Living Arrangements/Services Living arrangements for the past 2 months: Single Family Home Lives with:: Self Patient language and need for interpreter reviewed:: Yes Do you feel safe going back to the place where you live?: Yes      Need for Family Participation in Patient Care: Yes (Comment) Care giver support system in place?: Yes (comment) Current home services: DME (home oxygen w/ [employer]) Criminal Activity/Legal Involvement Pertinent to Current Situation/Hospitalization: No - Comment as needed  Activities of Daily Living Home Assistive Devices/Equipment: None ADL Screening  (condition at time of admission) Patient's cognitive ability adequate to safely complete daily activities?: Yes Is the patient deaf or have difficulty hearing?: No Does the patient have difficulty seeing, even when wearing glasses/contacts?: No Does the patient have difficulty concentrating, remembering, or making decisions?: No Patient able to express need for assistance with ADLs?: Yes Does the patient have difficulty dressing or bathing?: No Independently performs ADLs?: Yes (appropriate for developmental age) Does the patient have difficulty walking or climbing stairs?: No Weakness of Legs: None Weakness of Arms/Hands: None  Permission Sought/Granted Permission sought to share information with : Case Manager Permission granted to share information with : Yes, Verbal Permission Granted  Share Information with NAME: Stacy [name], CM     Permission granted to share info w Relationship: Ramsey Leverington (spouse) [phone]     Emotional Assessment Appearance:: Appears stated age Attitude/Demeanor/Rapport: Gracious Affect (typically observed): Accepting Orientation: : Oriented to Self, Oriented to Place, Oriented to  Time, Oriented to Situation Alcohol / Substance Use: Not Applicable Psych Involvement: No (comment)  Admission diagnosis:  Hypoxia [R09.02] Elevated troponin [R79.89] CAP (community acquired pneumonia) [J18.9] Acute hypoxemic respiratory failure (HCC) [J96.01] Pneumonia due to infectious organism, unspecified laterality, unspecified part of lung [J18.9] Patient Active Problem List   Diagnosis Date Noted   Lactic acid increased 12/09/2022   Acute hypoxemic respiratory failure ([place]) 12/09/2022   CAP (community acquired pneumonia) 12/08/2022  Hemoptysis 12/08/2022   Elevated troponin 12/08/2022   Acute on chronic respiratory failure with hypoxia ([place]) 08/08/2022   Palpitations 08/03/2022   Osteopenia of multiple sites 12/15/2021   Vitamin D insufficiency  12/15/2021   Facet arthropathy, lumbar 12/15/2021   Asthma 12/07/2021   Coronary artery disease 12/07/2021   Atrophy of vagina 12/07/2021   Dysphagia 12/07/2021   Ganglion of hand 12/07/2021   Hemorrhoids 12/07/2021   Polyp of colon 12/07/2021   Asthmatic bronchitis 12/07/2021   Pain in female genitalia on intercourse 12/07/2021   Essential hypertension 12/07/2021   Menopause 12/07/2021   Angina pectoris ([place]) 06/09/2021   Fatty liver disease, nonalcoholic [id number]   IBS (irritable bowel syndrome) 06/07/2021   Hyperlipidemia associated with type 2 diabetes mellitus ([place]) 03/24/2020   Family history of rheumatoid arthritis 06/05/2019   Lateral epicondylitis of left elbow 06/05/2019   Family history of osteoporosis 04/22/2019   Post-menopausal 04/22/2019   Statin intolerance 04/10/2019   Dyspnea on exertion 08/21/2018   Reactive depression 06/26/2018   Psychophysiological insomnia 05/28/2018   Snoring 05/28/2018   Hypersomnia with sleep apnea 05/28/2018   Centrilobular emphysema ([place]) 05/28/2018   Obesity (BMI 30.0-34.9) 05/28/2018   Controlled diabetes mellitus type 2 with complications ([place]) [id number]   Lung disease, bullous ([place]) 08/14/2017   Aortic atherosclerosis ([place]) 08/14/2017   Stress incontinence in female 06/09/2017   Overactive bladder 06/09/2017   Precordial pain    HYPERTENSION, BENIGN 12/24/2008   Allergic rhinitis 02/25/2008   GERD 11/02/2007   COPD with acute exacerbation ([place]) 11/02/2007   PERIMENOPAUSAL SYNDROME 11/02/2007   Rosacea 08/01/2007   Hypothyroidism 06/13/2007   UTI'S, RECURRENT 06/13/2007   PCP:  [name], DO Pharmacy:   [id number] [place]Phone: [phone]: [phone] [facility] [address] [facility]Phone: [phone]:  [phone] [address] [place]Phone: [phone]: [phone]    Social Determinants of Health (SDOH) Social History: SDOH Screenings   Food Insecurity: No Food Insecurity (12/11/2022)  Housing: Low Risk  (12/11/2022)  Transportation Needs: No Transportation Needs (12/11/2022)  Utilities: Not At Risk (12/11/2022)  Alcohol Screen: Low Risk  (12/27/2021)  Depression (PHQ2-9): Low Risk  (06/08/2022)  Financial Resource Strain: Low Risk  (12/27/2021)  Physical Activity: Insufficiently Active (12/27/2021)  Social Connections: Socially Integrated (12/27/2021)  Stress: No Stress Concern Present (12/27/2021)  Tobacco Use: Medium Risk (12/09/2022)   SDOH Interventions: Food Insecurity Interventions: Inpatient TOC Housing Interventions: Inpatient TOC Transportation Interventions: Inpatient TOC Utilities Interventions: Inpatient TOC   Readmission Risk Interventions     No data to display

## 2022-12-11 NOTE — Progress Notes (Signed)
Mobility Specialist - Progress Note  Nurse requested Mobility Specialist to perform oxygen saturation test with pt which includes removing pt from oxygen both at rest and while ambulating.  Below are the results from that testing.     Patient Saturations on Room Air at Rest = spO2 94%  Patient Saturations on Room Air while Ambulating = sp02 88% .   Patient Saturations on 2 Liters of oxygen while Ambulating = sp02 92%  At end of testing pt left in room on 2  Liters of oxygen.  Reported results to nurse.     12/11/22 1040  Mobility  Activity Ambulated independently in hallway  Level of Assistance Independent  Assistive Device None  Distance Ambulated (ft) 500 ft  Range of Motion/Exercises Active  Activity Response Tolerated well  Mobility Referral Yes  $Mobility charge 1 Mobility   (RA) Pre-mobility: 94% SpO2 During mobility: 88% SpO2 Post-mobility: 90% SPO2  (2L [place]) Pre-mobility: 97% SpO2 During mobility: 92% SpO2 Post-mobility: 94% SPO2  Pt was found in room sitting on chair and agreeable to ambulate. Pt ambulate on RA ~228f, had some SOB towards EOS and once back in room SPO2 was 88%. After a couple minutes pt then ambulated ~2513fon 2L [place] and stated feeling much better this time around than the first. Had no complaints and at EOS returned to sit EOB. Husband in room and RN notified of session.  BrFerd Hibbsobility Specialist

## 2022-12-11 NOTE — Progress Notes (Signed)
D/c instructions reviewed with patient. All questions answered. IV removed. Pt discharged in stable condition with all patient belongings. Patient with personal home O2 tank in car.

## 2022-12-12 ENCOUNTER — Ambulatory Visit: Payer: Medicare Other | Admitting: Nurse Practitioner

## 2022-12-12 ENCOUNTER — Telehealth: Payer: Self-pay

## 2022-12-12 ENCOUNTER — Encounter: Payer: Self-pay | Admitting: Nurse Practitioner

## 2022-12-12 ENCOUNTER — Telehealth (HOSPITAL_COMMUNITY): Payer: Self-pay | Admitting: Emergency Medicine

## 2022-12-12 VITALS — BP 118/72 | HR 66 | Ht 63.5 in | Wt 146.2 lb

## 2022-12-12 DIAGNOSIS — I2510 Atherosclerotic heart disease of native coronary artery without angina pectoris: Secondary | ICD-10-CM

## 2022-12-12 DIAGNOSIS — J9621 Acute and chronic respiratory failure with hypoxia: Secondary | ICD-10-CM | POA: Diagnosis not present

## 2022-12-12 DIAGNOSIS — J189 Pneumonia, unspecified organism: Secondary | ICD-10-CM

## 2022-12-12 DIAGNOSIS — R079 Chest pain, unspecified: Secondary | ICD-10-CM

## 2022-12-12 DIAGNOSIS — J441 Chronic obstructive pulmonary disease with (acute) exacerbation: Secondary | ICD-10-CM

## 2022-12-12 DIAGNOSIS — J432 Centrilobular emphysema: Secondary | ICD-10-CM

## 2022-12-12 LAB — LEGIONELLA PNEUMOPHILA SEROGP 1 UR AG: L. pneumophila Serogp 1 Ur Ag: NEGATIVE

## 2022-12-12 MED ORDER — METOPROLOL TARTRATE 100 MG PO TABS: 100.0000 mg | ORAL_TABLET | Freq: Once | ORAL | 0 refills | Status: DC

## 2022-12-12 MED ORDER — ALBUTEROL SULFATE HFA 108 (90 BASE) MCG/ACT IN AERS: 2.0000 | INHALATION_SPRAY | Freq: Four times a day (QID) | RESPIRATORY_TRACT | 2 refills | Status: AC | PRN

## 2022-12-12 NOTE — Patient Instructions (Addendum)
Continue Albuterol inhaler 2 puffs or 3 mL neb every 6 hours as needed for shortness of breath or wheezing. Notify if symptoms persist despite rescue inhaler/neb use.  Continue Anoro 1 puff daily Continue loratadine 1 tab daily  Continue tussionex 5 mL every 12 hours as needed for severe coughing. If you don't use the Tussionex in the morning, you can take 2 tsp of delsym cough syrup over the counter Continue benzonatate 1 capsule Three times a day for cough - use consistently over the next 3-4 days Continue guaifenesin (mucinex) 1200 mg Twice daily for cough/congestion Continue supplemental oxygen 2 lpm with activity and at night. You qualified for [employer] today so we will change your oxygen company to [employer]   Prednisone taper. Take prednisone 40 mg for the next three days then 30 mg for 5 days, 20 mg for 5 days, 10 mg for 5 days then stop. Take in AM with food. Let me know if you don't have enough tablets of prednisone at home to complete   Follow up with cardiology as scheduled  Follow up as scheduled with Dr. Lamonte Sakai. If symptoms do not improve or worsen, please contact office for sooner follow up or seek emergency care.

## 2022-12-12 NOTE — Transitions of Care (Post Inpatient/ED Visit) (Signed)
   12/12/2022  Name: MCKENZI ZIESER MRN: [id number] DOB: 02-01-1956  Today's TOC FU Call Status: Today's TOC FU Call Status:: Unsuccessul Call (1st Attempt) Unsuccessful Call (1st Attempt) Date: 12/12/22  Attempted to reach the patient regarding the most recent Inpatient/ED visit.  Follow Up Plan: Additional outreach attempts will be made to reach the patient to complete the Transitions of Care (Post Inpatient/ED visit) call.   Johnney Killian, RN, BSN, CCM Care Management Coordinator [place] Phone: [phone]: [phone]

## 2022-12-12 NOTE — Telephone Encounter (Signed)
Appt made for 2/27   Mychart instructions sent  16m metoprolol tartrate sent to pharm on file  [facility] [phone]  [phone]

## 2022-12-12 NOTE — Progress Notes (Deleted)
$'@Patient'R$  ID: Stacy [name], female    DOB: 1956/06/27, 67 y.o.   MRN: [id number]  Chief Complaint  Patient presents with   Follow-up    Pt HFU she was admitted for PNA, she is currently using 2L when exerting.     Referring provider: [name], DO  HPI: 67 year old female, former smoker followed for severe COPD and chronic respiratory failure on supplemental O2.  She is a patient Dr. Agustina Caroli and last seen in office 08/08/2022.  Past medical history significant for hypertension, CAD, allergic rhinitis, GERD, hypothyroid, DM 2, insomnia, obesity.  She was recently admitted from 12/08/2022 to 12/11/2022 for acute on chronic respiratory failure secondary to AECOPD and CAP due to metapneumovirus.  She was also found to have a lactic acidosis of unclear etiology.  Was not felt to be septic or in shock.  She had completed Levaquin course outpatient and was treated with ceftriaxone/azithromycin upon admission.  Was felt to be viral in nature so antibiotics were discontinued.  Suspicion that increased lactate was was related to demand or cardiogenic cause as her troponins were also significantly elevated.  She had cardiology evaluation with echocardiogram revealing grade 1 diastolic dysfunction; otherwise normal.  She was advised to follow-up outpatient and plans for cardiac CTA.  She was treated with nebs, steroids and transitioned to Anoro at discharge.  She was 88% on room air with exertion so was advised to continue using 2 L supplemental O2 once home and plan to reevaluate at follow-up.  TEST/EVENTS:   12/12/2022: Today - follow up Patient presents today for hospital follow-up.  She was discharged yesterday, 2/18.  Feels stable since discharge.  She is feeling better compared to when she was first admitted.  Does not feel like her breathing is entirely back to her baseline and she is still having a productive cough.  Still occasionally has some discomfort that is primarily in the center of  her chest. She's very concerned something is wrong with her heart. She has a significant family history of heart disease. Plans for cardiac CT 2/27 and outpatient visit with cardiology 3/20. She denies any fevers, chills, hemoptysis, orthopnea, leg swelling, palpitations, dizziness or syncope. She is currently on 40 mg of prednisone daily and will be done with this in 2 days. She is using her Anoro daily and neb treatments twice a day. Monitoring her oxygen at home and able to maintain above 88% on 2 lpm. She would like to see if she could switch to [employer] and have a POC machine as the tanks are very heavy for her.   Allergies  Allergen Reactions   Avelox [Moxifloxacin Hcl In Nacl] Hives   Ciprofloxacin     REACTION: Rash   Livalo [Pitavastatin]    Metformin And Related     kidney   Moxifloxacin     REACTION: RASH   Bactrim [Sulfamethoxazole-Trimethoprim] Rash   Statins Other (See Comments)    myalgias   Sulfamethoxazole-Trimethoprim Rash    REACTION: Rash    Immunization History  Administered Date(s) Administered   H1N1 10/06/2008   Influenza Inj Mdck Quad With Preservative 08/06/2018   Influenza Split 08/25/2017, 08/06/2018, 07/23/2019   Influenza Whole 08/01/2007, 08/04/2009   Influenza,inj,Quad PF,6+ Mos 07/14/2016, 08/25/2017, 08/02/2021   Influenza-Unspecified 08/25/2017, 08/06/2018, 07/23/2019, 08/24/2020   Moderna Sars-Covid-2 Vaccination 03/31/2021, 09/07/2021   PFIZER(Purple Top)SARS-COV-2 Vaccination 01/08/2020, 01/29/2020, 08/24/2020   Pneumococcal Conjugate-13 06/08/2018   Pneumococcal Polysaccharide-23 08/01/2007   Td 08/01/2007   Tdap 01/23/2018  Past Medical History:  Diagnosis Date   Allergic rhinitis 02/25/2008   Qualifier: Diagnosis of  By: Valetta Close DO, Karen     Angina pectoris ([place]) 06/09/2021   Aortic atherosclerosis ([place]) 08/14/2017   Appreciated on CXR 08/14/2017   Asthma    Asthmatic bronchitis 12/07/2021   Atrophy of vagina 12/07/2021    Centrilobular emphysema ([place]) 05/28/2018   Controlled diabetes mellitus type 2 with complications ([place]) 123XX123   Has FLD.   COPD Gold C 11/02/2007   Formatting of this note might be different from the original. PFT 05/28/2019           ratio 50% FEV1 53% FVC 83%    DLCO 54% PFT 09/25/18 ([facility]) ratio 52% FEV1 56% FVC 108%; DLCO 69%   Coronary artery disease    Dysphagia 12/07/2021   Dyspnea on exertion 08/21/2018   Essential hypertension 12/07/2021   Facet arthropathy, lumbar 12/15/2021   Family history of osteoporosis 04/22/2019   Family history of rheumatoid arthritis 06/05/2019   Fatty liver disease, nonalcoholic    Ganglion of hand 12/07/2021   GERD 11/02/2007   Qualifier: Diagnosis of  By: Valetta Close DO, Karen     Hemorrhoids 12/07/2021   Hyperlipidemia associated with type 2 diabetes mellitus ([place]) 03/24/2020   Hypersomnia with sleep apnea 05/28/2018   HYPERTENSION, BENIGN 12/24/2008   Qualifier: Diagnosis of  By: Stanford Breed, MD, Kandyce Rud    Hypothyroidism 06/13/2007   ? Hashimotos.  Has strong family hx autoimmune d/o No h/o thyroidectomy or radioablation.    IBS (irritable bowel syndrome)    Lateral epicondylitis of left elbow 06/05/2019   Lung disease, bullous ([place]) 08/14/2017   Menopause 12/07/2021   Obesity (BMI 30.0-34.9) 05/28/2018   Osteopenia of multiple sites 12/15/2021   Overactive bladder 06/09/2017   Pain in female genitalia on intercourse 12/07/2021   Polyp of colon 12/07/2021   Post-menopausal 04/22/2019   Precordial pain    Sees Dr Stanford Breed.  Last cardiac cath 2016 = negative. Strong family h/o MI and CAD   Psychophysiological insomnia 05/28/2018   Reactive depression 06/26/2018   Rosacea 08/01/2007   Qualifier: Diagnosis of  By: Valetta Close DO, Karen     Snoring 05/28/2018   Stage 2 moderate COPD by GOLD classification ([place]) 11/02/2007   Formatting of this note might be different from the original. PFT 05/28/2019           ratio 50% FEV1 53% FVC 83%    DLCO  54% PFT 09/25/18 ([facility]) ratio 52% FEV1 56% FVC 108%; DLCO 69%   Statin intolerance 04/10/2019   Stress incontinence in female 06/09/2017   UTI'S, RECURRENT 06/13/2007   Qualifier: Diagnosis of  By: Valetta Close DO, Santiago Glad     Vitamin D insufficiency 12/15/2021    Tobacco History: Social History   Tobacco Use  Smoking Status Former   Packs/day: 0.50   Years: 30.00   Total pack years: 15.00   Types: Cigarettes   Quit date: 10/25/2007   Years since quitting: 15.1  Smokeless Tobacco Never   Counseling given: Not Answered   Outpatient Medications Prior to Visit  Medication Sig Dispense Refill   albuterol (PROVENTIL) (2.5 MG/3ML) 0.083% nebulizer solution Take 3 mLs (2.5 mg total) by nebulization every 6 (six) hours as needed for wheezing or shortness of breath. 75 mL 12   azithromycin (ZITHROMAX) 250 MG tablet Take 1 tablet (250 mg total) by mouth daily for 3 days. 3 tablet 0   benzonatate (TESSALON) 100 MG capsule Take 1  capsule (100 mg total) by mouth every 8 (eight) hours. 21 capsule 0   chlorpheniramine-HYDROcodone (TUSSIONEX) 10-8 MG/5ML Take 5 mLs by mouth every 12 (twelve) hours for 30 doses. 150 mL 0   Evolocumab (REPATHA) 140 MG/ML SOSY Inject 140 mg into the skin every 14 (fourteen) days. 6 mL 0   FLUoxetine (PROZAC) 20 MG capsule Take 20 mg by mouth daily.     furosemide (LASIX) 20 MG tablet Take 20 mg by mouth daily.     glipiZIDE (GLUCOTROL) 5 MG tablet Take 1 tablet (5 mg total) by mouth 2 (two) times daily for 10 days. 20 tablet 0   guaiFENesin (MUCINEX) 600 MG 12 hr tablet Take 1,200 mg by mouth 2 (two) times daily as needed for cough.     levothyroxine (SYNTHROID) 75 MCG tablet Take 1 tablet (75 mcg total) by mouth daily. 100 tablet 3   loratadine (CLARITIN) 10 MG tablet Take 10 mg by mouth daily.     metoprolol succinate (TOPROL XL) 25 MG 24 hr tablet Take 1 tablet (25 mg total) by mouth daily. 90 tablet 3   metoprolol tartrate (LOPRESSOR) 100 MG tablet Take 1 tablet (100 mg  total) by mouth once for 1 dose. Please take one time dose '100mg'$  metoprolol tartrate 2 hr prior to cardiac CT for HR control IF HR >55bpm. 1 tablet 0   Omeprazole-Sodium Bicarbonate (ZEGERID) 20-1100 MG CAPS capsule Take 1 capsule by mouth at bedtime.     predniSONE (DELTASONE) 20 MG tablet Take 2 tablets (40 mg total) by mouth as directed for 4 days. Dose Pack: Take 50 mg for 2 Days, Then Take 40 mg for 2 Days, Then Take 30 mg for 2 Days, Then Take 20 mg for 2 Days, Then Take 10 mg for 2 Days. 8 tablet 0   umeclidinium-vilanterol (ANORO ELLIPTA) 62.5-25 MCG/ACT AEPB Inhale 1 puff into the lungs daily. 180 each 3   Vitamin D, Ergocalciferol, (DRISDOL) 1.25 MG (50000 UNIT) CAPS capsule Take 1 capsule (50,000 Units total) by mouth every 7 (seven) days. 12 capsule 3   zolpidem (AMBIEN) 5 MG tablet Take 1 tablet (5 mg total) by mouth at bedtime as needed for sleep. 30 tablet 0   albuterol (VENTOLIN HFA) 108 (90 Base) MCG/ACT inhaler Inhale 2 puffs into the lungs every 6 (six) hours as needed for wheezing or shortness of breath. 16 g 1   No facility-administered medications prior to visit.     Review of Systems:   Constitutional: No weight loss or gain, night sweats, fevers, chills, fatigue, or lassitude. HEENT: No headaches, difficulty swallowing, tooth/dental problems, or sore throat. No sneezing, itching, ear ache, nasal congestion, or post nasal drip CV:  No chest pain, orthopnea, PND, swelling in lower extremities, anasarca, dizziness, palpitations, syncope Resp: No shortness of breath with exertion or at rest. No excess mucus or change in color of mucus. No productive or non-productive. No hemoptysis. No wheezing.  No chest wall deformity GI:  No heartburn, indigestion, abdominal pain, nausea, vomiting, diarrhea, change in bowel habits, loss of appetite, bloody stools.  GU: No dysuria, change in color of urine, urgency or frequency.  No flank pain, no hematuria  Skin: No rash, lesions,  ulcerations MSK:  No joint pain or swelling.  No decreased range of motion.  No back pain. Neuro: No dizziness or lightheadedness.  Psych: No depression or anxiety. Mood stable.     Physical Exam:  BP 118/72   Pulse 66   Ht 5'  3.5" (1.613 m)   Wt 146 lb 3.2 oz (66.3 kg)   SpO2 97%   BMI 25.49 kg/m   GEN: Pleasant, interactive, well-nourished/chronically-ill appearing/acutely-ill appearing/poorly-nourished/morbidly obese; in no acute distress.****** HEENT:  Normocephalic and atraumatic. EACs patent bilaterally. TM pearly gray with present light reflex bilaterally. PERRLA. Sclera white. Nasal turbinates pink, moist and patent bilaterally. No rhinorrhea present. Oropharynx pink and moist, without exudate or edema. No lesions, ulcerations, or postnasal drip.  NECK:  Supple w/ fair ROM. No JVD present. Normal carotid impulses w/o bruits. Thyroid symmetrical with no goiter or nodules palpated. No lymphadenopathy.   CV: RRR, no m/r/g, no peripheral edema. Pulses intact, +2 bilaterally. No cyanosis, pallor or clubbing. PULMONARY:  Unlabored, regular breathing. Clear bilaterally A&P w/o wheezes/rales/rhonchi. No accessory muscle use.  GI: BS present and normoactive. Soft, non-tender to palpation. No organomegaly or masses detected. No CVA tenderness. MSK: No erythema, warmth or tenderness. Cap refil <2 sec all extrem. No deformities or joint swelling noted.  Neuro: A/Ox3. No focal deficits noted.   Skin: Warm, no lesions or rashe Psych: Normal affect and behavior. Judgement and thought content appropriate.     Lab Results:  CBC    Component Value Date/Time   WBC 17.5 (H) 12/09/2022 0026   RBC 4.77 12/09/2022 0026   HGB 15.2 (H) 12/09/2022 0026   HGB 15.3 08/03/2022 1342   HCT 46.6 (H) 12/09/2022 0026   HCT 46.1 08/03/2022 1342   PLT 306 12/09/2022 0026   PLT 332 08/03/2022 1342   MCV 97.7 12/09/2022 0026   MCV 95 08/03/2022 1342   MCH 31.9 12/09/2022 0026   MCHC 32.6 12/09/2022  0026   RDW 12.6 12/09/2022 0026   RDW 12.5 08/03/2022 1342   LYMPHSABS 0.8 12/06/2022 1614   LYMPHSABS 2.3 01/18/2022 1634   MONOABS 0.3 12/06/2022 1614   EOSABS 0.0 12/06/2022 1614   EOSABS 0.3 01/18/2022 1634   BASOSABS 0.0 12/06/2022 1614   BASOSABS 0.0 01/18/2022 1634    BMET    Component Value Date/Time   NA 138 12/10/2022 0443   NA 144 08/03/2022 1342   K 4.8 12/10/2022 0443   CL 105 12/10/2022 0443   CO2 26 12/10/2022 0443   GLUCOSE 171 (H) 12/10/2022 0443   BUN 16 12/10/2022 0443   BUN 11 08/03/2022 1342   BUN 9 09/29/2016 0000   CREATININE 0.85 12/10/2022 0443   CREATININE 0.68 06/10/2015 0001   CALCIUM 8.4 (L) 12/10/2022 0443   CALCIUM 9.7 09/28/2016 0000   GFRNONAA >60 12/10/2022 0443   GFRAA 96 12/01/2020 1451    BNP    Component Value Date/Time   BNP 190.0 (H) 12/08/2022 2122     Imaging:  ECHOCARDIOGRAM COMPLETE  Result Date: 12/09/2022    ECHOCARDIOGRAM REPORT   Patient Name:   Stacy [name] Date of Exam: 12/09/2022 Medical Rec #:  [id number]     Height:       63.5 in Accession #:    [id number]    Weight:       149.5 lb Date of Birth:  11-14-1955     BSA:          1.719 m Patient Age:    30 years      BP:           123/62 mmHg Patient Gender: F             HR:           73 bpm. Exam Location:  Inpatient Procedure: 2D Echo Indications:    elevated troponin  History:        Patient has prior history of Echocardiogram examinations, most                 recent 08/12/2022. COPD; Risk Factors:Hypertension and                 Dyslipidemia.  Sonographer:    Harvie Junior Referring Phys: [id number] ANASTASSIA DOUTOVA  Sonographer Comments: Technically difficult study due to poor echo windows and suboptimal parasternal window. Image acquisition challenging due to COPD. IMPRESSIONS  1. Left ventricular ejection fraction, by estimation, is 65 to 70%. The left ventricle has normal function. The left ventricle has no regional wall motion abnormalities. Left ventricular diastolic  parameters are consistent with Grade I diastolic dysfunction (impaired relaxation).  2. Right ventricular systolic function is normal. The right ventricular size is normal.  3. The mitral valve is normal in structure. No evidence of mitral valve regurgitation. No evidence of mitral stenosis.  4. The aortic valve is normal in structure. Aortic valve regurgitation is not visualized. No aortic stenosis is present.  5. The inferior vena cava is normal in size with greater than 50% respiratory variability, suggesting right atrial pressure of 3 mmHg. Comparison(s): No significant change from prior study. Prior images reviewed side by side. FINDINGS  Left Ventricle: Left ventricular ejection fraction, by estimation, is 65 to 70%. The left ventricle has normal function. The left ventricle has no regional wall motion abnormalities. The left ventricular internal cavity size was normal in size. There is  no left ventricular hypertrophy. Left ventricular diastolic parameters are consistent with Grade I diastolic dysfunction (impaired relaxation). Right Ventricle: The right ventricular size is normal. No increase in right ventricular wall thickness. Right ventricular systolic function is normal. Left Atrium: Left atrial size was normal in size. Right Atrium: Right atrial size was normal in size. Pericardium: There is no evidence of pericardial effusion. Mitral Valve: The mitral valve is normal in structure. No evidence of mitral valve regurgitation. No evidence of mitral valve stenosis. Tricuspid Valve: The tricuspid valve is normal in structure. Tricuspid valve regurgitation is not demonstrated. No evidence of tricuspid stenosis. Aortic Valve: The aortic valve is normal in structure. Aortic valve regurgitation is not visualized. No aortic stenosis is present. Aortic valve mean gradient measures 4.0 mmHg. Aortic valve peak gradient measures 6.7 mmHg. Aortic valve area, by VTI measures 2.15 cm. Pulmonic Valve: The pulmonic valve  was normal in structure. Pulmonic valve regurgitation is not visualized. No evidence of pulmonic stenosis. Aorta: The aortic root is normal in size and structure. Venous: The inferior vena cava is normal in size with greater than 50% respiratory variability, suggesting right atrial pressure of 3 mmHg. IAS/Shunts: No atrial level shunt detected by color flow Doppler.  LEFT VENTRICLE PLAX 2D LVIDd:         3.90 cm     Diastology LVIDs:         2.80 cm     LV e' medial:    7.67 cm/s LV PW:         0.90 cm     LV E/e' medial:  9.4 LV IVS:        0.80 cm     LV e' lateral:   11.40 cm/s LVOT diam:     1.90 cm     LV E/e' lateral: 6.3 LV SV:         55 LV SV Index:  32 LVOT Area:     2.84 cm  LV Volumes (MOD) LV vol d, MOD A2C: 67.3 ml LV vol d, MOD A4C: 86.6 ml LV vol s, MOD A2C: 25.1 ml LV vol s, MOD A4C: 36.5 ml LV SV MOD A2C:     42.2 ml LV SV MOD A4C:     86.6 ml LV SV MOD BP:      49.0 ml RIGHT VENTRICLE RV Basal diam:  2.90 cm RV Mid diam:    2.20 cm RV S prime:     11.95 cm/s TAPSE (M-mode): 1.7 cm LEFT ATRIUM             Index        RIGHT ATRIUM           Index LA Vol (A2C):   31.1 ml 18.09 ml/m  RA Area:     10.80 cm LA Vol (A4C):   30.0 ml 17.45 ml/m  RA Volume:   25.00 ml  14.55 ml/m LA Biplane Vol: 33.4 ml 19.43 ml/m  AORTIC VALVE AV Area (Vmax):    2.48 cm AV Area (Vmean):   2.25 cm AV Area (VTI):     2.15 cm AV Vmax:           129.00 cm/s AV Vmean:          91.100 cm/s AV VTI:            0.255 m AV Peak Grad:      6.7 mmHg AV Mean Grad:      4.0 mmHg LVOT Vmax:         113.00 cm/s LVOT Vmean:        72.200 cm/s LVOT VTI:          0.193 m LVOT/AV VTI ratio: 0.76  AORTA Ao Root diam: 3.10 cm MITRAL VALVE MV Area (PHT): 3.60 cm    SHUNTS MV Decel Time: 211 msec    Systemic VTI:  0.19 m MV E velocity: 72.00 cm/s  Systemic Diam: 1.90 cm MV A velocity: 85.70 cm/s MV E/A ratio:  0.84 Candee Furbish MD Electronically signed by Candee Furbish MD Signature Date/Time: 12/09/2022/3:30:09 PM    Final    CT Angio  Chest PE W and/or Wo Contrast  Result Date: 12/08/2022 CLINICAL DATA:  Pulmonary embolus suspected with high probability. Worsening shortness of breath. EXAM: CT ANGIOGRAPHY CHEST WITH CONTRAST TECHNIQUE: Multidetector CT imaging of the chest was performed using the standard protocol during bolus administration of intravenous contrast. Multiplanar CT image reconstructions and MIPs were obtained to evaluate the vascular anatomy. RADIATION DOSE REDUCTION: This exam was performed according to the departmental dose-optimization program which includes automated exposure control, adjustment of the mA and/or kV according to patient size and/or use of iterative reconstruction technique. CONTRAST:  18m OMNIPAQUE IOHEXOL 350 MG/ML SOLN COMPARISON:  Chest radiograph 12/08/2022. Cardiac CT 06/18/2021. CT angio chest 09/19/2020 FINDINGS: Cardiovascular: There is good opacification of the central and segmental pulmonary arteries representing technically adequate study. No focal filling defects are identified. No evidence of significant pulmonary embolus. Normal heart size. No pericardial effusions. Normal caliber thoracic aorta. No evidence of aortic dissection. Calcification of the aorta and coronary arteries. Mediastinum/Nodes: Esophagus is decompressed. No significant lymphadenopathy. Thyroid gland is unremarkable. Lungs/Pleura: Emphysematous changes throughout the lungs with scarring in the lung apices. Focal area of patchy infiltration demonstrated in the left lingula and lower lung which could indicate early pneumonia. Right lung is clear. No pleural effusions. No pneumothorax. Upper  Abdomen: Diffuse fatty infiltration of the liver. No acute process demonstrated in the upper abdomen. Musculoskeletal: Degenerative changes in the spine. Review of the MIP images confirms the above findings. IMPRESSION: 1. No evidence of significant pulmonary embolus. 2. Focal area of patchy infiltration demonstrated in the left lower lung  and lingula likely representing an area of pneumonia. 3. Aortic atherosclerosis. 4. Emphysematous changes in the lungs. 5. Fatty infiltration of the liver. Electronically Signed   By: Lucienne Capers M.D.   On: 12/08/2022 22:32   DG Chest 2 View  Result Date: 12/08/2022 CLINICAL DATA:  Dyspnea EXAM: CHEST - 2 VIEW COMPARISON:  12/06/2022 FINDINGS: The lungs are mildly hyperinflated paucity of vasculature within the lung apices in keeping with changes of emphysema. No superimposed confluent pulmonary infiltrate. No pneumothorax or pleural effusion. Cardiac size within normal limits. Pulmonary vascularity is normal. No acute bone abnormality. IMPRESSION: 1. No active cardiopulmonary disease. 2. Emphysema. Electronically Signed   By: Fidela Salisbury M.D.   On: 12/08/2022 20:53   DG Chest 2 View  Result Date: 12/06/2022 CLINICAL DATA:  Shortness of breath. EXAM: CHEST - 2 VIEW COMPARISON:  Chest x-ray October 16 23. FINDINGS: Mild streaky bibasilar opacities. Hyperinflation. No confluent consolidation. No visible pleural effusions or pneumothorax. Cardiomediastinal silhouette is within normal limits. Polyarticular degenerative change. Osteopenia. IMPRESSION: 1. Mild streaky bibasilar opacities, probably atelectasis. 2. Hyperinflation, suggestive of COPD/emphysema. Electronically Signed   By: Margaretha Sheffield M.D.   On: 12/06/2022 16:56         Latest Ref Rng & Units 03/01/2022    9:47 AM 09/28/2017   11:53 AM  PFT Results  FVC-Pre L 2.25  1.98   FVC-Predicted Pre % 72  61   FVC-Post L 2.35  2.34   FVC-Predicted Post % 75  72   Pre FEV1/FVC % % 50  54   Post FEV1/FCV % % 52  51   FEV1-Pre L 1.13  1.06   FEV1-Predicted Pre % 47  43   FEV1-Post L 1.21  1.18   DLCO uncorrected ml/min/mmHg 12.77  15.64   DLCO UNC% % 66  68   DLCO corrected ml/min/mmHg 12.70  15.50   DLCO COR %Predicted % 65  67   DLVA Predicted % 90  85   TLC L 5.40  5.90   TLC % Predicted % 110  120   RV % Predicted % 142  176      No results found for: "NITRICOXIDE"      Assessment & Plan:   COPD with acute exacerbation (HCC) Slow to resolve AECOPD secondary to viral pna r/t metapneumovirus. We will extend her prednisone. Target cough control measures. Maximize bronchodilator regimen. Strict return/ED precautions.  Patient Instructions  Continue Albuterol inhaler 2 puffs or 3 mL neb every 6 hours as needed for shortness of breath or wheezing. Notify if symptoms persist despite rescue inhaler/neb use.  Continue Anoro 1 puff daily Continue loratadine 1 tab daily  Continue tussionex 5 mL every 12 hours as needed for severe coughing. If you don't use the Tussionex in the morning, you can take 2 tsp of delsym cough syrup over the counter Continue benzonatate 1 capsule Three times a day for cough - use consistently over the next 3-4 days Continue guaifenesin (mucinex) 1200 mg Twice daily for cough/congestion Continue supplemental oxygen 2 lpm with activity and at night. You qualified for [place] today so we will change your oxygen company to [employer]   Prednisone taper. Take prednisone  40 mg for the next three days then 30 mg for 5 days, 20 mg for 5 days, 10 mg for 5 days then stop. Take in AM with food. Let me know if you don't have enough tablets of prednisone at home to complete   Follow up with cardiology as scheduled  Follow up as scheduled with Dr. Lamonte Sakai. If symptoms do not improve or worsen, please contact office for sooner follow up or seek emergency care.    Acute on chronic respiratory failure (HCC) Walking oximetry today with SpO2 low 88% on room air. Qualifying walk on POC; able to maintain on 2 lpm pulsed dose. Orders placed to change DME company to [place]. Goal >88-90%  Coronary artery disease Troponin and lactate disproportionately elevated when compared to level of hypoxia. Felt to be related to demand ischemia secondary to mild non-obstructive CAD. She has cardiac CT scheduled for 2/27 and plans to  follow up with cardiology outpatient. Understands that if she develops worsening CP, she should go to the ED for further evaluation.    I spent 45 minutes of dedicated to the care of this patient on the date of this encounter to include pre-visit review of records, face-to-face time with the patient discussing conditions above, post visit ordering of testing, clinical documentation with the electronic health record, making appropriate referrals as documented, and communicating necessary findings to members of the patients care team.  Clayton Bibles, NP 12/13/2022  Pt aware and understands NP's role.

## 2022-12-13 ENCOUNTER — Telehealth: Payer: Self-pay

## 2022-12-13 NOTE — Assessment & Plan Note (Signed)
Walking oximetry today with SpO2 low 88% on room air. Qualifying walk on POC; able to maintain on 2 lpm pulsed dose. Orders placed to change DME company to [employer]. Goal >88-90%

## 2022-12-13 NOTE — Transitions of Care (Post Inpatient/ED Visit) (Signed)
   12/13/2022  Name: Stacy [name] MRN: [id number] DOB: [birth year]  Today's TOC FU Call Status: Today's TOC FU Call Status:: Successful TOC FU Call Competed TOC FU Call Complete Date: 12/13/22  Transition Care Management Follow-up Telephone Call Date of Discharge: 12/11/22 Discharge Facility: [facility]) Type of Discharge: Inpatient Admission Primary Inpatient Discharge Diagnosis:: Pneumonia due to infectious agent How have you been since you were released from the hospital?: Better Any questions or concerns?: No  Items Reviewed: Did you receive and understand the discharge instructions provided?: Yes Medications obtained and verified?: Yes (Medications Reviewed) Any new allergies since your discharge?: No Dietary orders reviewed?: No Do you have support at home?: Yes People in Home: child(ren), dependent Name of Support/Comfort Primary Source: [facility] and Equipment/Supplies: [place] Ordered?: No Any new equipment or medical supplies ordered?: Yes Name of Medical supply agency?: [employer] ([employer]) Were you able to get the equipment/medical supplies?: Yes Do you have any questions related to the use of the equipment/supplies?: No  Functional Questionnaire: Do you need assistance with bathing/showering or dressing?: No Do you need assistance with meal preparation?: No Do you need assistance with eating?: No Do you have difficulty maintaining continence: No Do you need assistance with getting out of bed/getting out of a chair/moving?: No Do you have difficulty managing or taking your medications?: No  Folllow up appointments reviewed: PCP Follow-up appointment confirmed?: Yes Date of PCP follow-up appointment?: 12/12/22 Follow-up Provider: De Hollingshead, NP [facility] Follow-up appointment confirmed?: NA Do you need transportation to your follow-up appointment?: No Do you understand care options if your condition(s) worsen?: Yes-patient  verbalized understanding  SDOH Interventions Today    Flowsheet Row Most Recent Value  SDOH Interventions   Food Insecurity Interventions Intervention Not Indicated  Housing Interventions Intervention Not Indicated      Johnney Killian, RN, BSN, CCM Care Management Coordinator [facility] Phone: [phone]: [phone]

## 2022-12-13 NOTE — Assessment & Plan Note (Signed)
Slow to resolve AECOPD secondary to viral pna r/t metapneumovirus. We will extend her prednisone. Target cough control measures. Maximize bronchodilator regimen. Strict return/ED precautions.  Patient Instructions  Continue Albuterol inhaler 2 puffs or 3 mL neb every 6 hours as needed for shortness of breath or wheezing. Notify if symptoms persist despite rescue inhaler/neb use.  Continue Anoro 1 puff daily Continue loratadine 1 tab daily  Continue tussionex 5 mL every 12 hours as needed for severe coughing. If you don't use the Tussionex in the morning, you can take 2 tsp of delsym cough syrup over the counter Continue benzonatate 1 capsule Three times a day for cough - use consistently over the next 3-4 days Continue guaifenesin (mucinex) 1200 mg Twice daily for cough/congestion Continue supplemental oxygen 2 lpm with activity and at night. You qualified for [facility] today so we will change your oxygen company to [employer]   Prednisone taper. Take prednisone 40 mg for the next three days then 30 mg for 5 days, 20 mg for 5 days, 10 mg for 5 days then stop. Take in AM with food. Let me know if you don't have enough tablets of prednisone at home to complete   Follow up with cardiology as scheduled  Follow up as scheduled with Dr. Lamonte Sakai. If symptoms do not improve or worsen, please contact office for sooner follow up or seek emergency care.

## 2022-12-13 NOTE — Assessment & Plan Note (Signed)
Troponin and lactate disproportionately elevated when compared to level of hypoxia. Felt to be related to demand ischemia secondary to mild non-obstructive CAD. She has cardiac CT scheduled for 2/27 and plans to follow up with cardiology outpatient. Understands that if she develops worsening CP, she should go to the ED for further evaluation.

## 2022-12-14 LAB — CULTURE, BLOOD (ROUTINE X 2)
Culture: NO GROWTH
Culture: NO GROWTH
Special Requests: ADEQUATE
Special Requests: ADEQUATE

## 2022-12-14 NOTE — Telephone Encounter (Signed)
Mychart message sent by pt:  Jacklynn Barnacle [facility] (supporting Marland Kitchen V, NP)3 hours ago (6:20 AM)    Good Morning Belenda Cruise, I still have not received a call from my new oxygen company and I am running low on supplies.  Should I be calling them and if so which company do you want me to use?   I appreciate your help. Thank you. Camille Antar    Routing to [facility] for help with this.

## 2022-12-14 NOTE — Telephone Encounter (Signed)
Order was placed on 2/19 and was sent to [employer].  I called our rep Legrand Como and he states she was approved and he will have CSR rep to call her now.  Person calling will be Megan.  He states to make pt aware of this and to also give pt his name and # Legrand Como [phone] - and she can call him anytime.  I will route this to triage so pt can be made aware thru [place].

## 2022-12-15 DIAGNOSIS — I700 Atherosclerosis of aorta: Secondary | ICD-10-CM | POA: Diagnosis not present

## 2022-12-15 DIAGNOSIS — L139 Bullous disorder, unspecified: Secondary | ICD-10-CM | POA: Diagnosis not present

## 2022-12-15 DIAGNOSIS — J449 Chronic obstructive pulmonary disease, unspecified: Secondary | ICD-10-CM | POA: Diagnosis not present

## 2022-12-15 DIAGNOSIS — J432 Centrilobular emphysema: Secondary | ICD-10-CM | POA: Diagnosis not present

## 2022-12-15 DIAGNOSIS — J9601 Acute respiratory failure with hypoxia: Secondary | ICD-10-CM | POA: Diagnosis not present

## 2022-12-16 ENCOUNTER — Encounter: Payer: Self-pay | Admitting: Nurse Practitioner

## 2022-12-16 NOTE — Assessment & Plan Note (Signed)
Viral pneumonia secondary to metapneumovirus.  She had already completed Levaquin course outpatient and was treated with ceftriaxone/azithromycin upon admission.  Given her positive RVP, antibiotics were discontinued.  See above plan.  Will plan to repeat her imaging at follow-up to ensure resolution.

## 2022-12-16 NOTE — Progress Notes (Signed)
$'@Patient'k$  ID: Stacy [name], female    DOB: 1956/10/15, 67 y.o.   MRN: [id number]  Chief Complaint  Patient presents with   Follow-up    Pt HFU she was admitted for PNA, she is currently using 2L when exerting.     Referring provider: [name], DO  HPI: 67 year old female, former smoker followed for severe COPD and chronic respiratory failure on supplemental O2.  She is a patient Dr. Agustina Caroli and last seen in office 08/08/2022.  Past medical history significant for hypertension, CAD, allergic rhinitis, GERD, hypothyroid, DM 2, insomnia, obesity.  She was recently admitted from 12/08/2022 to 12/11/2022 for acute on chronic respiratory failure secondary to AECOPD and CAP due to metapneumovirus.  She was also found to have a lactic acidosis of unclear etiology.  Was not felt to be septic or in shock.  She had completed Levaquin course outpatient and was treated with ceftriaxone/azithromycin upon admission.  Was felt to be viral in nature so antibiotics were discontinued.  Suspicion that increased lactate was was related to demand or cardiogenic cause as her troponins were also significantly elevated.  She had cardiology evaluation with echocardiogram revealing grade 1 diastolic dysfunction; otherwise normal.  She was advised to follow-up outpatient and plans for cardiac CTA.  She was treated with nebs, steroids and transitioned to Anoro at discharge.  She was 88% on room air with exertion so was advised to continue using 2 L supplemental O2 once home and plan to reevaluate at follow-up.  TEST/EVENTS:  03/01/2022 PFT: FVC 72, FEV1 47, ratio 52, TLC 110, DLCOcor 65. No BD 12/08/2022 CTA chest: No evidence of PE.  Calcifications of the aorta and coronary arteries.  Esophagus is decompressed.  Emphysematous changes throughout the lungs with scarring in the lung apices.  Focal area of patchy infiltration demonstrated in the left lingula and lower lung, which could represent early pneumonia.  Right  lung is clear.  Diffuse fatty infiltration of the liver. 12/09/2022 echo: EF 65-70%, G1DD, RV size and function nl.  12/12/2022: Today - follow up Patient presents today for hospital follow-up.  She was discharged yesterday, 2/18.  Feels stable since discharge.  She is feeling better compared to when she was first admitted.  Does not feel like her breathing is entirely back to her baseline and she is still having a productive cough.  Still occasionally has some discomfort that is primarily in the center of her chest. She's very concerned something is wrong with her heart. She has a significant family history of heart disease. Plans for cardiac CT 2/27 and outpatient visit with cardiology 3/20. She denies any fevers, chills, hemoptysis, orthopnea, leg swelling, palpitations, dizziness or syncope. She is currently on 40 mg of prednisone daily and will be done with this in 2 days. She is using her Anoro daily and neb treatments twice a day. Monitoring her oxygen at home and able to maintain above 88% on 2 lpm. She would like to see if she could switch to [employer] and have a POC machine as the tanks are very heavy for her.   Allergies  Allergen Reactions   Avelox [Moxifloxacin Hcl In Nacl] Hives   Ciprofloxacin     REACTION: Rash   Livalo [Pitavastatin]    Metformin And Related     kidney   Moxifloxacin     REACTION: RASH   Bactrim [Sulfamethoxazole-Trimethoprim] Rash   Statins Other (See Comments)    myalgias   Sulfamethoxazole-Trimethoprim Rash    REACTION:  Rash    Immunization History  Administered Date(s) Administered   H1N1 10/06/2008   Influenza Inj Mdck Quad With Preservative 08/06/2018   Influenza Split 08/25/2017, 08/06/2018, 07/23/2019   Influenza Whole 08/01/2007, 08/04/2009   Influenza,inj,Quad PF,6+ Mos 07/14/2016, 08/25/2017, 08/02/2021   Influenza-Unspecified 08/25/2017, 08/06/2018, 07/23/2019, 08/24/2020   Moderna Sars-Covid-2 Vaccination 03/31/2021, 09/07/2021   PFIZER(Purple  Top)SARS-COV-2 Vaccination 01/08/2020, 01/29/2020, 08/24/2020   Pneumococcal Conjugate-13 06/08/2018   Pneumococcal Polysaccharide-23 08/01/2007   Td 08/01/2007   Tdap 01/23/2018    Past Medical History:  Diagnosis Date   Allergic rhinitis 02/25/2008   Qualifier: Diagnosis of  By: Valetta Close DO, Karen     Angina pectoris ([place]) 06/09/2021   Aortic atherosclerosis ([place]) 08/14/2017   Appreciated on CXR 08/14/2017   Asthma    Asthmatic bronchitis 12/07/2021   Atrophy of vagina 12/07/2021   Centrilobular emphysema ([place]) 05/28/2018   Controlled diabetes mellitus type 2 with complications ([place]) 123XX123   Has FLD.   COPD Gold C 11/02/2007   Formatting of this note might be different from the original. PFT 05/28/2019           ratio 50% FEV1 53% FVC 83%    DLCO 54% PFT 09/25/18 ([facility]) ratio 52% FEV1 56% FVC 108%; DLCO 69%   Coronary artery disease    Dysphagia 12/07/2021   Dyspnea on exertion 08/21/2018   Essential hypertension 12/07/2021   Facet arthropathy, lumbar 12/15/2021   Family history of osteoporosis 04/22/2019   Family history of rheumatoid arthritis 06/05/2019   Fatty liver disease, nonalcoholic    Ganglion of hand 12/07/2021   GERD 11/02/2007   Qualifier: Diagnosis of  By: Valetta Close DO, Karen     Hemorrhoids 12/07/2021   Hyperlipidemia associated with type 2 diabetes mellitus ([place]) 03/24/2020   Hypersomnia with sleep apnea 05/28/2018   HYPERTENSION, BENIGN 12/24/2008   Qualifier: Diagnosis of  By: Stanford Breed, MD, Kandyce Rud    Hypothyroidism 06/13/2007   ? Hashimotos.  Has strong family hx autoimmune d/o No h/o thyroidectomy or radioablation.    IBS (irritable bowel syndrome)    Lateral epicondylitis of left elbow 06/05/2019   Lung disease, bullous ([place]) 08/14/2017   Menopause 12/07/2021   Obesity (BMI 30.0-34.9) 05/28/2018   Osteopenia of multiple sites 12/15/2021   Overactive bladder 06/09/2017   Pain in female genitalia on intercourse 12/07/2021   Polyp of colon  12/07/2021   Post-menopausal 04/22/2019   Precordial pain    Sees Dr Stanford Breed.  Last cardiac cath 2016 = negative. Strong family h/o MI and CAD   Psychophysiological insomnia 05/28/2018   Reactive depression 06/26/2018   Rosacea 08/01/2007   Qualifier: Diagnosis of  By: Valetta Close DO, Karen     Snoring 05/28/2018   Stage 2 moderate COPD by GOLD classification ([place]) 11/02/2007   Formatting of this note might be different from the original. PFT 05/28/2019           ratio 50% FEV1 53% FVC 83%    DLCO 54% PFT 09/25/18 ([facility]) ratio 52% FEV1 56% FVC 108%; DLCO 69%   Statin intolerance 04/10/2019   Stress incontinence in female 06/09/2017   UTI'S, RECURRENT 06/13/2007   Qualifier: Diagnosis of  By: Valetta Close DO, Karen     Vitamin D insufficiency 12/15/2021    Tobacco History: Social History   Tobacco Use  Smoking Status Former   Packs/day: 0.50   Years: 30.00   Total pack years: 15.00   Types: Cigarettes   Quit date: 10/25/2007   Years since  quitting: 15.1  Smokeless Tobacco Never   Counseling given: Not Answered   Outpatient Medications Prior to Visit  Medication Sig Dispense Refill   albuterol (PROVENTIL) (2.5 MG/3ML) 0.083% nebulizer solution Take 3 mLs (2.5 mg total) by nebulization every 6 (six) hours as needed for wheezing or shortness of breath. 75 mL 12   azithromycin (ZITHROMAX) 250 MG tablet Take 1 tablet (250 mg total) by mouth daily for 3 days. 3 tablet 0   benzonatate (TESSALON) 100 MG capsule Take 1 capsule (100 mg total) by mouth every 8 (eight) hours. 21 capsule 0   chlorpheniramine-HYDROcodone (TUSSIONEX) 10-8 MG/5ML Take 5 mLs by mouth every 12 (twelve) hours for 30 doses. 150 mL 0   Evolocumab (REPATHA) 140 MG/ML SOSY Inject 140 mg into the skin every 14 (fourteen) days. 6 mL 0   FLUoxetine (PROZAC) 20 MG capsule Take 20 mg by mouth daily.     furosemide (LASIX) 20 MG tablet Take 20 mg by mouth daily.     glipiZIDE (GLUCOTROL) 5 MG tablet Take 1 tablet (5 mg total) by mouth 2  (two) times daily for 10 days. 20 tablet 0   guaiFENesin (MUCINEX) 600 MG 12 hr tablet Take 1,200 mg by mouth 2 (two) times daily as needed for cough.     levothyroxine (SYNTHROID) 75 MCG tablet Take 1 tablet (75 mcg total) by mouth daily. 100 tablet 3   loratadine (CLARITIN) 10 MG tablet Take 10 mg by mouth daily.     metoprolol succinate (TOPROL XL) 25 MG 24 hr tablet Take 1 tablet (25 mg total) by mouth daily. 90 tablet 3   metoprolol tartrate (LOPRESSOR) 100 MG tablet Take 1 tablet (100 mg total) by mouth once for 1 dose. Please take one time dose '100mg'$  metoprolol tartrate 2 hr prior to cardiac CT for HR control IF HR >55bpm. 1 tablet 0   Omeprazole-Sodium Bicarbonate (ZEGERID) 20-1100 MG CAPS capsule Take 1 capsule by mouth at bedtime.     predniSONE (DELTASONE) 20 MG tablet Take 2 tablets (40 mg total) by mouth as directed for 4 days. Dose Pack: Take 50 mg for 2 Days, Then Take 40 mg for 2 Days, Then Take 30 mg for 2 Days, Then Take 20 mg for 2 Days, Then Take 10 mg for 2 Days. 8 tablet 0   umeclidinium-vilanterol (ANORO ELLIPTA) 62.5-25 MCG/ACT AEPB Inhale 1 puff into the lungs daily. 180 each 3   Vitamin D, Ergocalciferol, (DRISDOL) 1.25 MG (50000 UNIT) CAPS capsule Take 1 capsule (50,000 Units total) by mouth every 7 (seven) days. 12 capsule 3   zolpidem (AMBIEN) 5 MG tablet Take 1 tablet (5 mg total) by mouth at bedtime as needed for sleep. 30 tablet 0   albuterol (VENTOLIN HFA) 108 (90 Base) MCG/ACT inhaler Inhale 2 puffs into the lungs every 6 (six) hours as needed for wheezing or shortness of breath. 16 g 1   No facility-administered medications prior to visit.     Review of Systems:   Constitutional: No weight loss or gain, night sweats, fevers, chills, or lassitude. +fatigue HEENT: No headaches, difficulty swallowing, tooth/dental problems, or sore throat. No sneezing, itching, ear ache, nasal congestion, or post nasal drip CV:  +chest pain (intermittent). No orthopnea, PND,  swelling in lower extremities, anasarca, dizziness, palpitations, syncope Resp: + shortness of breath with exertion; productive cough. No hemoptysis. No wheezing.  No chest wall deformity GI:  No heartburn, indigestion, abdominal pain, nausea, vomiting, diarrhea, change in bowel habits, loss of  appetite, bloody stools.  GU: No dysuria, change in color of urine, urgency or frequency.  Skin: No rash, lesions, ulcerations MSK:  No joint pain or swelling.   Neuro: No dizziness or lightheadedness.  Psych: No depression or anxiety. Mood stable.     Physical Exam:  BP 118/72   Pulse 66   Ht 5' 3.5" (1.613 m)   Wt 146 lb 3.2 oz (66.3 kg)   SpO2 97%   BMI 25.49 kg/m   GEN: Pleasant, interactive, chronically-ill appearing; in no acute distress HEENT:  Normocephalic and atraumatic. PERRLA. Sclera white. Nasal turbinates pink, moist and patent bilaterally. No rhinorrhea present. Oropharynx pink and moist, without exudate or edema. No lesions, ulcerations, or postnasal drip.  NECK:  Supple w/ fair ROM. No JVD present. Normal carotid impulses w/o bruits. Thyroid symmetrical with no goiter or nodules palpated. No lymphadenopathy.   CV: RRR, no m/r/g, no peripheral edema. Pulses intact, +2 bilaterally. No cyanosis, pallor or clubbing. PULMONARY:  Unlabored, regular breathing. Diminished with minimal scattered rhonchi bilaterally A&P. No accessory muscle use.  GI: BS present and normoactive. Soft, non-tender to palpation. No organomegaly or masses detected.  MSK: No erythema, warmth or tenderness. Cap refil <2 sec all extrem. No deformities or joint swelling noted.  Neuro: A/Ox3. No focal deficits noted.   Skin: Warm, no lesions or rashe Psych: Normal affect and behavior. Judgement and thought content appropriate.     Lab Results:  CBC    Component Value Date/Time   WBC 17.5 (H) 12/09/2022 0026   RBC 4.77 12/09/2022 0026   HGB 15.2 (H) 12/09/2022 0026   HGB 15.3 08/03/2022 1342   HCT 46.6  (H) 12/09/2022 0026   HCT 46.1 08/03/2022 1342   PLT 306 12/09/2022 0026   PLT 332 08/03/2022 1342   MCV 97.7 12/09/2022 0026   MCV 95 08/03/2022 1342   MCH 31.9 12/09/2022 0026   MCHC 32.6 12/09/2022 0026   RDW 12.6 12/09/2022 0026   RDW 12.5 08/03/2022 1342   LYMPHSABS 0.8 12/06/2022 1614   LYMPHSABS 2.3 01/18/2022 1634   MONOABS 0.3 12/06/2022 1614   EOSABS 0.0 12/06/2022 1614   EOSABS 0.3 01/18/2022 1634   BASOSABS 0.0 12/06/2022 1614   BASOSABS 0.0 01/18/2022 1634    BMET    Component Value Date/Time   NA 138 12/10/2022 0443   NA 144 08/03/2022 1342   K 4.8 12/10/2022 0443   CL 105 12/10/2022 0443   CO2 26 12/10/2022 0443   GLUCOSE 171 (H) 12/10/2022 0443   BUN 16 12/10/2022 0443   BUN 11 08/03/2022 1342   BUN 9 09/29/2016 0000   CREATININE 0.85 12/10/2022 0443   CREATININE 0.68 06/10/2015 0001   CALCIUM 8.4 (L) 12/10/2022 0443   CALCIUM 9.7 09/28/2016 0000   GFRNONAA >60 12/10/2022 0443   GFRAA 96 12/01/2020 1451    BNP    Component Value Date/Time   BNP 190.0 (H) 12/08/2022 2122     Imaging:  ECHOCARDIOGRAM COMPLETE  Result Date: 12/09/2022    ECHOCARDIOGRAM REPORT   Patient Name:   Stacy [name] Date of Exam: 12/09/2022 Medical Rec #:  [id number]     Height:       63.5 in Accession #:    [id number]    Weight:       149.5 lb Date of Birth:  [birth year]     BSA:          1.719 m Patient Age:    79 years  BP:           123/62 mmHg Patient Gender: F             HR:           73 bpm. Exam Location:  Inpatient Procedure: 2D Echo Indications:    elevated troponin  History:        Patient has prior history of Echocardiogram examinations, most                 recent 08/12/2022. COPD; Risk Factors:Hypertension and                 Dyslipidemia.  Sonographer:    Harvie Junior Referring Phys: [id number] ANASTASSIA DOUTOVA  Sonographer Comments: Technically difficult study due to poor echo windows and suboptimal parasternal window. Image acquisition challenging due to COPD.  IMPRESSIONS  1. Left ventricular ejection fraction, by estimation, is 65 to 70%. The left ventricle has normal function. The left ventricle has no regional wall motion abnormalities. Left ventricular diastolic parameters are consistent with Grade I diastolic dysfunction (impaired relaxation).  2. Right ventricular systolic function is normal. The right ventricular size is normal.  3. The mitral valve is normal in structure. No evidence of mitral valve regurgitation. No evidence of mitral stenosis.  4. The aortic valve is normal in structure. Aortic valve regurgitation is not visualized. No aortic stenosis is present.  5. The inferior vena cava is normal in size with greater than 50% respiratory variability, suggesting right atrial pressure of 3 mmHg. Comparison(s): No significant change from prior study. Prior images reviewed side by side. FINDINGS  Left Ventricle: Left ventricular ejection fraction, by estimation, is 65 to 70%. The left ventricle has normal function. The left ventricle has no regional wall motion abnormalities. The left ventricular internal cavity size was normal in size. There is  no left ventricular hypertrophy. Left ventricular diastolic parameters are consistent with Grade I diastolic dysfunction (impaired relaxation). Right Ventricle: The right ventricular size is normal. No increase in right ventricular wall thickness. Right ventricular systolic function is normal. Left Atrium: Left atrial size was normal in size. Right Atrium: Right atrial size was normal in size. Pericardium: There is no evidence of pericardial effusion. Mitral Valve: The mitral valve is normal in structure. No evidence of mitral valve regurgitation. No evidence of mitral valve stenosis. Tricuspid Valve: The tricuspid valve is normal in structure. Tricuspid valve regurgitation is not demonstrated. No evidence of tricuspid stenosis. Aortic Valve: The aortic valve is normal in structure. Aortic valve regurgitation is not  visualized. No aortic stenosis is present. Aortic valve mean gradient measures 4.0 mmHg. Aortic valve peak gradient measures 6.7 mmHg. Aortic valve area, by VTI measures 2.15 cm. Pulmonic Valve: The pulmonic valve was normal in structure. Pulmonic valve regurgitation is not visualized. No evidence of pulmonic stenosis. Aorta: The aortic root is normal in size and structure. Venous: The inferior vena cava is normal in size with greater than 50% respiratory variability, suggesting right atrial pressure of 3 mmHg. IAS/Shunts: No atrial level shunt detected by color flow Doppler.  LEFT VENTRICLE PLAX 2D LVIDd:         3.90 cm     Diastology LVIDs:         2.80 cm     LV e' medial:    7.67 cm/s LV PW:         0.90 cm     LV E/e' medial:  9.4 LV IVS:  0.80 cm     LV e' lateral:   11.40 cm/s LVOT diam:     1.90 cm     LV E/e' lateral: 6.3 LV SV:         55 LV SV Index:   32 LVOT Area:     2.84 cm  LV Volumes (MOD) LV vol d, MOD A2C: 67.3 ml LV vol d, MOD A4C: 86.6 ml LV vol s, MOD A2C: 25.1 ml LV vol s, MOD A4C: 36.5 ml LV SV MOD A2C:     42.2 ml LV SV MOD A4C:     86.6 ml LV SV MOD BP:      49.0 ml RIGHT VENTRICLE RV Basal diam:  2.90 cm RV Mid diam:    2.20 cm RV S prime:     11.95 cm/s TAPSE (M-mode): 1.7 cm LEFT ATRIUM             Index        RIGHT ATRIUM           Index LA Vol (A2C):   31.1 ml 18.09 ml/m  RA Area:     10.80 cm LA Vol (A4C):   30.0 ml 17.45 ml/m  RA Volume:   25.00 ml  14.55 ml/m LA Biplane Vol: 33.4 ml 19.43 ml/m  AORTIC VALVE AV Area (Vmax):    2.48 cm AV Area (Vmean):   2.25 cm AV Area (VTI):     2.15 cm AV Vmax:           129.00 cm/s AV Vmean:          91.100 cm/s AV VTI:            0.255 m AV Peak Grad:      6.7 mmHg AV Mean Grad:      4.0 mmHg LVOT Vmax:         113.00 cm/s LVOT Vmean:        72.200 cm/s LVOT VTI:          0.193 m LVOT/AV VTI ratio: 0.76  AORTA Ao Root diam: 3.10 cm MITRAL VALVE MV Area (PHT): 3.60 cm    SHUNTS MV Decel Time: 211 msec    Systemic VTI:  0.19 m MV  E velocity: 72.00 cm/s  Systemic Diam: 1.90 cm MV A velocity: 85.70 cm/s MV E/A ratio:  0.84 Candee Furbish MD Electronically signed by Candee Furbish MD Signature Date/Time: 12/09/2022/3:30:09 PM    Final    CT Angio Chest PE W and/or Wo Contrast  Result Date: 12/08/2022 CLINICAL DATA:  Pulmonary embolus suspected with high probability. Worsening shortness of breath. EXAM: CT ANGIOGRAPHY CHEST WITH CONTRAST TECHNIQUE: Multidetector CT imaging of the chest was performed using the standard protocol during bolus administration of intravenous contrast. Multiplanar CT image reconstructions and MIPs were obtained to evaluate the vascular anatomy. RADIATION DOSE REDUCTION: This exam was performed according to the departmental dose-optimization program which includes automated exposure control, adjustment of the mA and/or kV according to patient size and/or use of iterative reconstruction technique. CONTRAST:  143m OMNIPAQUE IOHEXOL 350 MG/ML SOLN COMPARISON:  Chest radiograph 12/08/2022. Cardiac CT 06/18/2021. CT angio chest 09/19/2020 FINDINGS: Cardiovascular: There is good opacification of the central and segmental pulmonary arteries representing technically adequate study. No focal filling defects are identified. No evidence of significant pulmonary embolus. Normal heart size. No pericardial effusions. Normal caliber thoracic aorta. No evidence of aortic dissection. Calcification of the aorta and coronary arteries. Mediastinum/Nodes: Esophagus is decompressed. No significant  lymphadenopathy. Thyroid gland is unremarkable. Lungs/Pleura: Emphysematous changes throughout the lungs with scarring in the lung apices. Focal area of patchy infiltration demonstrated in the left lingula and lower lung which could indicate early pneumonia. Right lung is clear. No pleural effusions. No pneumothorax. Upper Abdomen: Diffuse fatty infiltration of the liver. No acute process demonstrated in the upper abdomen. Musculoskeletal:  Degenerative changes in the spine. Review of the MIP images confirms the above findings. IMPRESSION: 1. No evidence of significant pulmonary embolus. 2. Focal area of patchy infiltration demonstrated in the left lower lung and lingula likely representing an area of pneumonia. 3. Aortic atherosclerosis. 4. Emphysematous changes in the lungs. 5. Fatty infiltration of the liver. Electronically Signed   By: Lucienne Capers M.D.   On: 12/08/2022 22:32   DG Chest 2 View  Result Date: 12/08/2022 CLINICAL DATA:  Dyspnea EXAM: CHEST - 2 VIEW COMPARISON:  12/06/2022 FINDINGS: The lungs are mildly hyperinflated paucity of vasculature within the lung apices in keeping with changes of emphysema. No superimposed confluent pulmonary infiltrate. No pneumothorax or pleural effusion. Cardiac size within normal limits. Pulmonary vascularity is normal. No acute bone abnormality. IMPRESSION: 1. No active cardiopulmonary disease. 2. Emphysema. Electronically Signed   By: Fidela Salisbury M.D.   On: 12/08/2022 20:53   DG Chest 2 View  Result Date: 12/06/2022 CLINICAL DATA:  Shortness of breath. EXAM: CHEST - 2 VIEW COMPARISON:  Chest x-ray October 16 23. FINDINGS: Mild streaky bibasilar opacities. Hyperinflation. No confluent consolidation. No visible pleural effusions or pneumothorax. Cardiomediastinal silhouette is within normal limits. Polyarticular degenerative change. Osteopenia. IMPRESSION: 1. Mild streaky bibasilar opacities, probably atelectasis. 2. Hyperinflation, suggestive of COPD/emphysema. Electronically Signed   By: Margaretha Sheffield M.D.   On: 12/06/2022 16:56         Latest Ref Rng & Units 03/01/2022    9:47 AM 09/28/2017   11:53 AM  PFT Results  FVC-Pre L 2.25  1.98   FVC-Predicted Pre % 72  61   FVC-Post L 2.35  2.34   FVC-Predicted Post % 75  72   Pre FEV1/FVC % % 50  54   Post FEV1/FCV % % 52  51   FEV1-Pre L 1.13  1.06   FEV1-Predicted Pre % 47  43   FEV1-Post L 1.21  1.18   DLCO uncorrected  ml/min/mmHg 12.77  15.64   DLCO UNC% % 66  68   DLCO corrected ml/min/mmHg 12.70  15.50   DLCO COR %Predicted % 65  67   DLVA Predicted % 90  85   TLC L 5.40  5.90   TLC % Predicted % 110  120   RV % Predicted % 142  176     No results found for: "NITRICOXIDE"      Assessment & Plan:   COPD with acute exacerbation (HCC) Slow to resolve AECOPD secondary to viral pna r/t metapneumovirus. We will extend her prednisone. Target cough control measures. Maximize bronchodilator regimen. Strict return/ED precautions.  Patient Instructions  Continue Albuterol inhaler 2 puffs or 3 mL neb every 6 hours as needed for shortness of breath or wheezing. Notify if symptoms persist despite rescue inhaler/neb use.  Continue Anoro 1 puff daily Continue loratadine 1 tab daily  Continue tussionex 5 mL every 12 hours as needed for severe coughing. If you don't use the Tussionex in the morning, you can take 2 tsp of delsym cough syrup over the counter Continue benzonatate 1 capsule Three times a day for cough - use  consistently over the next 3-4 days Continue guaifenesin (mucinex) 1200 mg Twice daily for cough/congestion Continue supplemental oxygen 2 lpm with activity and at night. You qualified for [place] today so we will change your oxygen company to [employer]   Prednisone taper. Take prednisone 40 mg for the next three days then 30 mg for 5 days, 20 mg for 5 days, 10 mg for 5 days then stop. Take in AM with food. Let me know if you don't have enough tablets of prednisone at home to complete   Follow up with cardiology as scheduled  Follow up as scheduled with Dr. Lamonte Sakai. If symptoms do not improve or worsen, please contact office for sooner follow up or seek emergency care.    Acute on chronic respiratory failure (HCC) Walking oximetry today with SpO2 low 88% on room air. Qualifying walk on POC; able to maintain on 2 lpm pulsed dose. Orders placed to change DME company to [employer]. Goal >88-90%  Coronary  artery disease Troponin and lactate disproportionately elevated when compared to level of hypoxia. Felt to be related to demand ischemia secondary to mild non-obstructive CAD. She has cardiac CT scheduled for 2/27 and plans to follow up with cardiology outpatient. Understands that if she develops worsening CP, she should go to the ED for further evaluation.   CAP (community acquired pneumonia) Viral pneumonia secondary to metapneumovirus.  She had already completed Levaquin course outpatient and was treated with ceftriaxone/azithromycin upon admission.  Given her positive RVP, antibiotics were discontinued.  See above plan.  Will plan to repeat her imaging at follow-up to ensure resolution.   I spent 45 minutes of dedicated to the care of this patient on the date of this encounter to include pre-visit review of records, face-to-face time with the patient discussing conditions above, post visit ordering of testing, clinical documentation with the electronic health record, making appropriate referrals as documented, and communicating necessary findings to members of the patients care team.  Clayton Bibles, NP 12/16/2022  Pt aware and understands NP's role.

## 2022-12-20 ENCOUNTER — Ambulatory Visit (HOSPITAL_COMMUNITY)
Admission: RE | Admit: 2022-12-20 | Discharge: 2022-12-20 | Disposition: A | Discharge status: Home / Self Care | Payer: Medicare Other | Source: Ambulatory Visit | Attending: Cardiology | Admitting: Cardiology

## 2022-12-20 DIAGNOSIS — I200 Unstable angina: Secondary | ICD-10-CM | POA: Diagnosis not present

## 2022-12-20 MED ORDER — NITROGLYCERIN 0.4 MG SL SUBL
SUBLINGUAL_TABLET | SUBLINGUAL | Status: AC
  Filled 2022-12-20: qty 2

## 2022-12-20 MED ORDER — IOHEXOL 350 MG/ML SOLN
100.0000 mL | Freq: Once | INTRAVENOUS | Status: AC | PRN
  Administered 2022-12-20: 100 mL via INTRAVENOUS

## 2022-12-20 MED ORDER — NITROGLYCERIN 0.4 MG SL SUBL
0.8000 mg | SUBLINGUAL_TABLET | Freq: Once | SUBLINGUAL | Status: AC
  Administered 2022-12-20: 0.8 mg via SUBLINGUAL

## 2022-12-23 ENCOUNTER — Other Ambulatory Visit: Payer: Self-pay | Admitting: Emergency Medicine

## 2022-12-23 ENCOUNTER — Telehealth: Payer: Self-pay | Admitting: *Deleted

## 2022-12-23 NOTE — Telephone Encounter (Signed)
RN called patient .  Per Dr Stanford Breed  result information was given to patient , she verbalized understanding.  She states she will keep appointment with Dr Geraldo Pitter

## 2022-12-23 NOTE — Telephone Encounter (Signed)
Stacy Serge, NP  Lelon Perla, MD; Raiford Simmonds, RN  Pt we saw in hospital.  She will follow up with Dr. Geraldo Pitter.  Ivin Booty will you please let pt know she does have some coronary artery disease but not severe but should be treated with cholesterol medication and keep follow up with cardiology.

## 2022-12-26 ENCOUNTER — Other Ambulatory Visit: Payer: Self-pay

## 2022-12-26 DIAGNOSIS — R072 Precordial pain: Secondary | ICD-10-CM | POA: Insufficient documentation

## 2022-12-29 ENCOUNTER — Ambulatory Visit (INDEPENDENT_AMBULATORY_CARE_PROVIDER_SITE_OTHER): Payer: Medicare Other

## 2022-12-29 VITALS — Ht 63.0 in | Wt 141.0 lb

## 2022-12-29 DIAGNOSIS — Z0000 Encounter for general adult medical examination without abnormal findings: Secondary | ICD-10-CM

## 2022-12-29 DIAGNOSIS — Z1231 Encounter for screening mammogram for malignant neoplasm of breast: Secondary | ICD-10-CM

## 2022-12-29 DIAGNOSIS — Z780 Asymptomatic menopausal state: Secondary | ICD-10-CM

## 2022-12-29 NOTE — Patient Instructions (Signed)
Stacy [name] , Thank you for taking time to come for your Medicare Wellness Visit. I appreciate your ongoing commitment to your health goals. Please review the following plan we discussed and let me know if I can assist you in the future.   These are the goals we discussed:  Goals      AWV     12/24/2020 AWV Goal: Exercise for General Health  Patient will verbalize understanding of the benefits of increased physical activity: Exercising regularly is important. It will improve your overall fitness, flexibility, and endurance. Regular exercise also will improve your overall health. It can help you control your weight, reduce stress, and improve your bone density. Over the next year, patient will increase physical activity as tolerated with a goal of at least 150 minutes of moderate physical activity per week.  You can tell that you are exercising at a moderate intensity if your heart starts beating faster and you start breathing faster but can still hold a conversation. Moderate-intensity exercise ideas include: Walking 1 mile (1.6 km) in about 15 minutes Biking Hiking Golfing Dancing Water aerobics Patient will verbalize understanding of everyday activities that increase physical activity by providing examples like the following: Yard work, such as: Sales promotion account executive Gardening Washing windows or floors Patient will be able to explain general safety guidelines for exercising:  Before you start a new exercise program, talk with your health care provider. Do not exercise so much that you hurt yourself, feel dizzy, or get very short of breath. Wear comfortable clothes and wear shoes with good support. Drink plenty of water while you exercise to prevent dehydration or heat stroke. Work out until your breathing and your heartbeat get faster.      DIET - INCREASE WATER INTAKE        This is a list of the  screening recommended for you and due dates:  Health Maintenance  Topic Date Due   Yearly kidney health urinalysis for diabetes  Never done   Zoster (Shingles) Vaccine (1 of 2) Never done   Mammogram  03/03/2022   COVID-19 Vaccine (6 - 2023-24 season) 06/24/2022   DEXA scan (bone density measurement)  04/19/2023*   Complete foot exam   04/19/2023   Hemoglobin A1C  06/09/2023   Eye exam for diabetics  08/27/2023   Colon Cancer Screening  09/05/2023   Yearly kidney function blood test for diabetes  12/11/2023   Pap Smear  12/25/2023   Medicare Annual Wellness Visit  12/29/2023   DTaP/Tdap/Td vaccine (3 - Td or Tdap) 01/24/2028   Flu Shot  Completed   Hepatitis C Screening: USPSTF Recommendation to screen - Ages 18-79 yo.  Completed   HIV Screening  Completed   HPV Vaccine  Aged Out  *Topic was postponed. The date shown is not the original due date.    Advanced directives: Advance directive discussed with you today. I have provided a copy for you to complete at home and have notarized. Once this is complete please bring a copy in to our office so we can scan it into your chart.   Conditions/risks identified: Aim for 30 minutes of exercise or brisk walking, 6-8 glasses of water, and 5 servings of fruits and vegetables each day.   Next appointment: Follow up in one year for your annual wellness visit.   Preventive Care 40-64 Years, Female Preventive care refers to lifestyle choices and visits with  your health care provider that can promote health and wellness. What does preventive care include? A yearly physical exam. This is also called an annual well check. Dental exams once or twice a year. Routine eye exams. Ask your health care provider how often you should have your eyes checked. Personal lifestyle choices, including: Daily care of your teeth and gums. Regular physical activity. Eating a healthy diet. Avoiding tobacco and drug use. Limiting alcohol use. Practicing safe  sex. Taking low-dose aspirin daily starting at age 47. Taking vitamin and mineral supplements as recommended by your health care provider. What happens during an annual well check? The services and screenings done by your health care provider during your annual well check will depend on your age, overall health, lifestyle risk factors, and family history of disease. Counseling  Your health care provider may ask you questions about your: Alcohol use. Tobacco use. Drug use. Emotional well-being. Home and relationship well-being. Sexual activity. Eating habits. Work and work Statistician. Method of birth control. Menstrual cycle. Pregnancy history. Screening  You may have the following tests or measurements: Height, weight, and BMI. Blood pressure. Lipid and cholesterol levels. These may be checked every 5 years, or more frequently if you are over 51 years old. Skin check. Lung cancer screening. You may have this screening every year starting at age 57 if you have a 30-pack-year history of smoking and currently smoke or have quit within the past 15 years. Fecal occult blood test (FOBT) of the stool. You may have this test every year starting at age 39. Flexible sigmoidoscopy or colonoscopy. You may have a sigmoidoscopy every 5 years or a colonoscopy every 10 years starting at age 67. Hepatitis C blood test. Hepatitis B blood test. Sexually transmitted disease (STD) testing. Diabetes screening. This is done by checking your blood sugar (glucose) after you have not eaten for a while (fasting). You may have this done every 1-3 years. Mammogram. This may be done every 1-2 years. Talk to your health care provider about when you should start having regular mammograms. This may depend on whether you have a family history of breast cancer. BRCA-related cancer screening. This may be done if you have a family history of breast, ovarian, tubal, or peritoneal cancers. Pelvic exam and Pap test. This  may be done every 3 years starting at age 19. Starting at age 81, this may be done every 5 years if you have a Pap test in combination with an HPV test. Bone density scan. This is done to screen for osteoporosis. You may have this scan if you are at high risk for osteoporosis. Discuss your test results, treatment options, and if necessary, the need for more tests with your health care provider. Vaccines  Your health care provider may recommend certain vaccines, such as: Influenza vaccine. This is recommended every year. Tetanus, diphtheria, and acellular pertussis (Tdap, Td) vaccine. You may need a Td booster every 10 years. Zoster vaccine. You may need this after age 4. Pneumococcal 13-valent conjugate (PCV13) vaccine. You may need this if you have certain conditions and were not previously vaccinated. Pneumococcal polysaccharide (PPSV23) vaccine. You may need one or two doses if you smoke cigarettes or if you have certain conditions. Talk to your health care provider about which screenings and vaccines you need and how often you need them. This information is not intended to replace advice given to you by your health care provider. Make sure you discuss any questions you have with your health care  provider. Document Released: 11/06/2015 Document Revised: 06/29/2016 Document Reviewed: 08/11/2015 Elsevier Interactive Patient Education  2017 [place] Prevention in the Home Falls can cause injuries. They can happen to people of all ages. There are many things you can do to make your home safe and to help prevent falls. What can I do on the outside of my home? Regularly fix the edges of walkways and driveways and fix any cracks. Remove anything that might make you trip as you walk through a door, such as a raised step or threshold. Trim any bushes or trees on the path to your home. Use bright outdoor lighting. Clear any walking paths of anything that might make someone trip, such  as rocks or tools. Regularly check to see if handrails are loose or broken. Make sure that both sides of any steps have handrails. Any raised decks and porches should have guardrails on the edges. Have any leaves, snow, or ice cleared regularly. Use sand or salt on walking paths during winter. Clean up any spills in your garage right away. This includes oil or grease spills. What can I do in the bathroom? Use night lights. Install grab bars by the toilet and in the tub and shower. Do not use towel bars as grab bars. Use non-skid mats or decals in the tub or shower. If you need to sit down in the shower, use a plastic, non-slip stool. Keep the floor dry. Clean up any water that spills on the floor as soon as it happens. Remove soap buildup in the tub or shower regularly. Attach bath mats securely with double-sided non-slip rug tape. Do not have throw rugs and other things on the floor that can make you trip. What can I do in the bedroom? Use night lights. Make sure that you have a light by your bed that is easy to reach. Do not use any sheets or blankets that are too big for your bed. They should not hang down onto the floor. Have a firm chair that has side arms. You can use this for support while you get dressed. Do not have throw rugs and other things on the floor that can make you trip. What can I do in the kitchen? Clean up any spills right away. Avoid walking on wet floors. Keep items that you use a lot in easy-to-reach places. If you need to reach something above you, use a strong step stool that has a grab bar. Keep electrical cords out of the way. Do not use floor polish or wax that makes floors slippery. If you must use wax, use non-skid floor wax. Do not have throw rugs and other things on the floor that can make you trip. What can I do with my stairs? Do not leave any items on the stairs. Make sure that there are handrails on both sides of the stairs and use them. Fix  handrails that are broken or loose. Make sure that handrails are as long as the stairways. Check any carpeting to make sure that it is firmly attached to the stairs. Fix any carpet that is loose or worn. Avoid having throw rugs at the top or bottom of the stairs. If you do have throw rugs, attach them to the floor with carpet tape. Make sure that you have a light switch at the top of the stairs and the bottom of the stairs. If you do not have them, ask someone to add them for you. What else can  I do to help prevent falls? Wear shoes that: Do not have high heels. Have rubber bottoms. Are comfortable and fit you well. Are closed at the toe. Do not wear sandals. If you use a stepladder: Make sure that it is fully opened. Do not climb a closed stepladder. Make sure that both sides of the stepladder are locked into place. Ask someone to hold it for you, if possible. Clearly mark and make sure that you can see: Any grab bars or handrails. First and last steps. Where the edge of each step is. Use tools that help you move around (mobility aids) if they are needed. These include: Canes. Walkers. Scooters. Crutches. Turn on the lights when you go into a dark area. Replace any light bulbs as soon as they burn out. Set up your furniture so you have a clear path. Avoid moving your furniture around. If any of your floors are uneven, fix them. If there are any pets around you, be aware of where they are. Review your medicines with your doctor. Some medicines can make you feel dizzy. This can increase your chance of falling. Ask your doctor what other things that you can do to help prevent falls. This information is not intended to replace advice given to you by your health care provider. Make sure you discuss any questions you have with your health care provider. Document Released: 08/06/2009 Document Revised: 03/17/2016 Document Reviewed: 11/14/2014 Elsevier Interactive Patient Education  2017  [employer].

## 2022-12-29 NOTE — Progress Notes (Signed)
Subjective:   Stacy [name] is a 67 y.o. female who presents for Medicare Annual (Subsequent) preventive examination. I connected with  [name] on 12/29/22 by a audio enabled telemedicine application and verified that I am speaking with the correct person using two identifiers.  Patient Location: Home  Provider Location: Home Office  I discussed the limitations of evaluation and management by telemedicine. The patient expressed understanding and agreed to proceed.  Review of Systems     Cardiac Risk Factors include: advanced age (>34mn, >>73women);diabetes mellitus;dyslipidemia;hypertension     Objective:    Today's Vitals   12/29/22 0955  Weight: 141 lb (64 kg)  Height: '5\' 3"'$  (1.6 m)   Body mass index is 24.98 kg/m.     12/29/2022   10:00 AM 12/08/2022    9:24 PM 12/08/2022    8:27 PM 12/27/2021   10:07 AM 08/04/2021   10:24 AM 03/15/2021    3:14 PM 12/24/2020    9:26 AM  Advanced Directives  Does Patient Have a Medical Advance Directive? No No No No No No No  Would patient like information on creating a medical advance directive? Yes (MAU/Ambulatory/Procedural Areas - Information given) No - Patient declined No - Patient declined No - Patient declined   Yes (MAU/Ambulatory/Procedural Areas - Information given)    Current Medications (verified) Outpatient Encounter Medications as of 12/29/2022  Medication Sig   albuterol (PROVENTIL) (2.5 MG/3ML) 0.083% nebulizer solution Take 3 mLs (2.5 mg total) by nebulization every 6 (six) hours as needed for wheezing or shortness of breath.   albuterol (VENTOLIN HFA) 108 (90 Base) MCG/ACT inhaler Inhale 2 puffs into the lungs every 6 (six) hours as needed for wheezing or shortness of breath.   ANORO ELLIPTA 62.5-25 MCG/ACT AEPB USE 1 INHALATION BY MOUTH ONCE  DAILY AT THE SAME TIME EACH DAY   benzonatate (TESSALON) 100 MG capsule Take 1 capsule (100 mg total) by mouth every 8 (eight) hours.   Evolocumab (REPATHA) 140 MG/ML SOSY  Inject 140 mg into the skin every 14 (fourteen) days.   FLUoxetine (PROZAC) 20 MG capsule Take 20 mg by mouth daily.   furosemide (LASIX) 20 MG tablet Take 20 mg by mouth daily.   guaiFENesin (MUCINEX) 600 MG 12 hr tablet Take 1,200 mg by mouth 2 (two) times daily as needed for cough.   levothyroxine (SYNTHROID) 75 MCG tablet Take 1 tablet (75 mcg total) by mouth daily.   loratadine (CLARITIN) 10 MG tablet Take 10 mg by mouth daily.   metoprolol succinate (TOPROL XL) 25 MG 24 hr tablet Take 1 tablet (25 mg total) by mouth daily.   Omeprazole-Sodium Bicarbonate (ZEGERID) 20-1100 MG CAPS capsule Take 1 capsule by mouth at bedtime.   Vitamin D, Ergocalciferol, (DRISDOL) 1.25 MG (50000 UNIT) CAPS capsule Take 1 capsule (50,000 Units total) by mouth every 7 (seven) days.   zolpidem (AMBIEN) 5 MG tablet Take 1 tablet (5 mg total) by mouth at bedtime as needed for sleep.   No facility-administered encounter medications on file as of 12/29/2022.    Allergies (verified) Avelox [moxifloxacin hcl in nacl], Ciprofloxacin, Livalo [pitavastatin], Metformin and related, Moxifloxacin, Bactrim [sulfamethoxazole-trimethoprim], Statins, and Sulfamethoxazole-trimethoprim   History: Past Medical History:  Diagnosis Date   Acute on chronic respiratory failure ([place] 12/09/2022   Allergic rhinitis 02/25/2008   Qualifier: Diagnosis of  By: BValetta CloseDO, Karen     Angina pectoris ([place] 06/09/2021   [place] 08/14[place]   Appreciated on CXR 08/14/2017  Asthma    Asthmatic bronchitis 12/07/2021   Atrophy of vagina 12/07/2021   CAP (community acquired pneumonia) 12/08/2022   Centrilobular emphysema ([place]) 05/28/2018   Controlled diabetes mellitus type 2 with complications ([place]) [id number]   Has FLD.   COPD with acute exacerbation ([place]) 11/02/2007   Formatting of this note might be different from the original.  PFT 05/28/2019           ratio 50% FEV1 53% FVC 83%    DLCO 54%  PFT 09/25/18 ([facility]) ratio  52% FEV1 56% FVC 108%; DLCO 69%   Coronary artery disease    Dysphagia 12/07/2021   Dyspnea on exertion 08/21/2018   Elevated LFTs 12/11/2022   Essential hypertension 12/07/2021   Facet arthropathy, lumbar 12/15/2021   Family history of osteoporosis 04/22/2019   Family history of rheumatoid arthritis 06/05/2019   Fatty liver disease, nonalcoholic    Ganglion of hand 12/07/2021   GERD 11/02/2007   Qualifier: Diagnosis of  By: Valetta Close DO, Karen     Hemoptysis 12/08/2022   Hemorrhoids 12/07/2021   Hyperlipidemia associated with type 2 diabetes mellitus ([place]) 03/24/2020   Hypersomnia with sleep apnea 05/28/2018   HYPERTENSION, BENIGN 12/24/2008   Qualifier: Diagnosis of  By: Stanford Breed, MD, Kandyce Rud    Hypothyroidism 06/13/2007   ? Hashimot[place].  Has strong family hx autoimmune d/o No h/o thyroidectomy or radioablation.    IBS (irritable bowel syndrome)    Insomnia- related to menopause 05/28/2018   Lactic acid increased 12/09/2022   Lateral epicondylitis of left elbow 06/05/2019   Lung disease, bullous ([place]) 08/14/2017   Menopause 12/07/2021   Obesity (BMI 30.0-34.9) 05/28/2018   Osteopenia of multiple sites 12/15/2021   Overactive bladder 06/09/2017   Pain in female genitalia on intercourse 12/07/2021   Palpitations 08/03/2022   Polyp of colon 12/07/2021   Post-menopausal 04/22/2019   Precordial pain    Sees Dr Stanford Breed.  Last cardiac cath 2016 = negative. Strong family h/o MI and CAD   Psychophysiological insomnia 05/28/2018   Reactive depression 06/26/2018   Rosacea 08/01/2007   Qualifier: Diagnosis of  By: Valetta Close DO, Karen     Snoring 05/28/2018   Stage 2 moderate COPD by GOLD classification ([place]) 11/02/2007   Formatting of this note might be different from the original. PFT 05/28/2019           ratio 50% FEV1 53% FVC 83%    DLCO 54% PFT 09/25/18 ([facility]) ratio 52% FEV1 56% FVC 108%; DLCO 69%   Statin intolerance 04/10/2019   Stress incontinence in female 06/09/2017    Type 2 MI (myocardial infarction) ([place]) 12/08/2022   UTI'S, RECURRENT 06/13/2007   Qualifier: Diagnosis of  By: Esmeralda Arthur     Vitamin D insufficiency 12/15/2021   Past Surgical History:  Procedure Laterality Date   ABLATION     CARDIAC CATHETERIZATION N/A 06/12/2015   Procedure: Left Heart Cath and Coronary Angiography;  Surgeon: Burnell Blanks, MD;  Location: [facility];  Service: Cardiovascular;  Laterality: N/A;   CESAREAN SECTION     LTCS     x2   NASAL SINUS SURGERY     SPIROMETRY  01/05/2007   FVC 68% predicted; FEV1 44% predicted; FEV1/FVC 63% predicted = moderate obstruction with low vital capacity possibly due to restriction   SQUAMOUS CELL CARCINOMA EXCISION     TUBAL LIGATION     Family History  Problem Relation Age of Onset   Hypertension Father  Hyperlipidemia Father    Heart disease Father        Died at age 4 of MI   Hypothyroidism Father    Diabetes Maternal Grandmother    Raynaud syndrome Sister    Rheum arthritis Sister    Thyroid disease Sister    CAD Brother        has 4 stents   Rheum arthritis Brother    Pulmonary fibrosis Brother    Heart disease Sister 74       h/o MI '@age'$  41   Thyroid disease Sister    Heart disease Mother    Hypothyroidism Mother    Macular degeneration Mother    Breast cancer Neg Hx    Ovarian cancer Neg Hx    Prostate cancer Neg Hx    Colon cancer Neg Hx    Stomach cancer Neg Hx    Esophageal cancer Neg Hx    Social History   Socioeconomic History   Marital status: Married    Spouse name: Not on file   Number of children: 2   Years of education: Not on file   Highest education level: Associate degree: academic program  Occupational History   Occupation: Works in Science writer   Occupation: Disabled  Tobacco Use   Smoking status: Former    Packs/day: 0.50    Years: 30.00    Total pack years: 15.00    Types: Cigarettes    Quit date: 10/25/2007    Years since quitting: 15.1   Smokeless  tobacco: Never  Vaping Use   Vaping Use: Never used  Substance and Sexual Activity   Alcohol use: Yes    Alcohol/week: 1.0 standard drink of alcohol    Types: 1 Glasses of wine per week    Comment: Occasional-once a year   Drug use: No   Sexual activity: Yes    Birth control/protection: Surgical  Other Topics Concern   Not on file  Social History Narrative   Remarried.    Previous occupation: Customer service manager.       Moved from [facility] in [place]   2 children, has stepchildren and grandchildren   Does not exercise   Social Determinants of Health   Financial Resource Strain: Low Risk  (12/29/2022)   Overall Financial Resource Strain (CARDIA)    Difficulty of Paying Living Expenses: Not hard at all  Food Insecurity: No Food Insecurity (12/29/2022)   Hunger Vital Sign    Worried About Running Out of Food in the Last Year: Never true    Ran Out of Food in the Last Year: Never true  Transportation Needs: No Transportation Needs (12/29/2022)   PRAPARE - Hydrologist (Medical): No    Lack of Transportation (Non-Medical): No  Physical Activity: Inactive (12/29/2022)   Exercise Vital Sign    Days of Exercise per Week: 0 days    Minutes of Exercise per Session: 0 min  Stress: No Stress Concern Present (12/29/2022)   [place]    Feeling of Stress : Not at all  Social Connections: Moderately Integrated (12/29/2022)   Social Connection and Isolation Panel [NHANES]    Frequency of Communication with Friends and Family: More than three times a week    Frequency of Social Gatherings with Friends and Family: More than three times a week    Attends Religious Services: 1 to 4 times per year    Active Member of Clubs or Organizations: No  Attends Archivist Meetings: Never    Marital Status: Married    Tobacco Counseling Counseling given: Not Answered   Clinical Intake:  Pre-visit preparation completed:  Yes  Pain : No/denies pain     Nutritional Risks: None Diabetes: Yes CBG done?: No Did pt. bring in CBG monitor from home?: No  How often do you need to have someone help you when you read instructions, pamphlets, or other written materials from your doctor or pharmacy?: 1 - Never  Diabetic?yes  Nutrition Risk Assessment:  Has the patient had any N/V/D within the last 2 months?  No  Does the patient have any non-healing wounds?  No  Has the patient had any unintentional weight loss or weight gain?  No   Diabetes:  Is the patient diabetic?  Yes  If diabetic, was a CBG obtained today?  No  Did the patient bring in their glucometer from home?  No  How often do you monitor your CBG's? 3 x day .   Financial Strains and Diabetes Management:  Are you having any financial strains with the device, your supplies or your medication? No .  Does the patient want to be seen by Chronic Care Management for management of their diabetes?  No  Would the patient like to be referred to a Nutritionist or for Diabetic Management?  No   Diabetic Exams:  Diabetic Eye Exam: Completed 07/2023 Diabetic Foot Exam: Overdue, Pt has been advised about the importance in completing this exam. Pt is scheduled for diabetic foot exam on next office visit .  Hearing/Vision screen Vision Screening - Comments:: Wears rx glasses - up to date with routine eye exams with  [facility]  Dietary issues and exercise activities discussed: Current Exercise Habits: The patient does not participate in regular exercise at present, Exercise limited by: respiratory conditions(s)   Goals Addressed             This Visit's Progress    DIET - INCREASE WATER INTAKE         Depression Screen    12/29/2022    9:58 AM 12/08/2022   10:37 AM 06/08/2022    3:47 PM 04/18/2022    9:35 AM 12/27/2021   10:01 AM 12/15/2021   10:39 AM 08/06/2021   10:17 AM  PHQ 2/9 Scores  PHQ - 2 Score 0  0 0 0 0 1  PHQ- 9 Score   '4 2 4 4 5   '$ Exception Documentation  Patient refusal         Fall Risk    12/29/2022    9:56 AM 12/08/2022   10:36 AM 06/08/2022    3:47 PM 04/18/2022    9:35 AM 12/27/2021    9:56 AM  Fall Risk   Falls in the past year? 0 0 0 0 0  Number falls in past yr: 0    0  Injury with Fall? 0    0  Risk for fall due to : No Fall Risks    Orthopedic patient  Follow up Falls prevention discussed    Falls prevention discussed    FALL RISK PREVENTION PERTAINING TO THE HOME:  Any stairs in or around the home? Yes  If so, are there any without handrails? No  Home free of loose throw rugs in walkways, pet beds, electrical cords, etc? Yes  Adequate lighting in your home to reduce risk of falls? Yes   ASSISTIVE DEVICES UTILIZED TO PREVENT FALLS:  Life alert? No  Use of a cane, walker or w/c? No  Grab bars in the bathroom? Yes  Shower chair or bench in shower? No  Elevated toilet seat or a handicapped toilet? No       12/29/2022   10:01 AM 12/24/2020    9:28 AM  6CIT Screen  What Year? 0 points 0 points  What month? 0 points 0 points  What time? 0 points 0 points  Count back from 20 0 points 0 points  Months in reverse 0 points 0 points  Repeat phrase 0 points 0 points  Total Score 0 points 0 points    Immunizations Immunization History  Administered Date(s) Administered   H1N1 10/06/2008   Influenza Inj Mdck Quad Pf 08/06/2022   Influenza Inj Mdck Quad With Preservative 08/06/2018   Influenza Split 08/25/2017, 08/06/2018, 07/23/2019   Influenza Whole 08/01/2007, 08/04/2009   Influenza,inj,Quad PF,6+ Mos 07/14/2016, 08/25/2017, 08/02/2021   Influenza-Unspecified 08/25/2017, 08/06/2018, 07/23/2019, 08/24/2020   Moderna Sars-Covid-2 Vaccination 03/31/2021, 09/07/2021   PFIZER(Purple Top)SARS-COV-2 Vaccination 01/08/2020, 01/29/2020, 08/24/2020   Pneumococcal Conjugate-13 06/08/2018   Pneumococcal Polysaccharide-23 08/01/2007   Td 08/01/2007   Tdap 01/23/2018    TDAP status: Up to date  Flu  Vaccine status: Up to date  Pneumococcal vaccine status: Up to date  Covid-19 vaccine status: Completed vaccines  Qualifies for Shingles Vaccine? Yes   Zostavax completed Yes   Shingrix Completed?: Yes  Screening Tests Health Maintenance  Topic Date Due   Diabetic kidney evaluation - Urine ACR  Never done   Zoster Vaccines- Shingrix (1 of 2) Never done   MAMMOGRAM  03/03/2022   COVID-19 Vaccine (6 - 2023-24 season) 06/24/2022   DEXA SCAN  04/19/2023 (Originally 04/22/2021)   FOOT EXAM  04/19/2023   HEMOGLOBIN A1C  06/09/2023   OPHTHALMOLOGY EXAM  08/27/2023   COLONOSCOPY (Pts 45-7yr Insurance coverage will need to be confirmed)  09/05/2023   Diabetic kidney evaluation - eGFR measurement  12/11/2023   PAP SMEAR-Modifier  12/25/2023   Medicare Annual Wellness (AWV)  12/29/2023   DTaP/Tdap/Td (3 - Td or Tdap) 01/24/2028   INFLUENZA VACCINE  Completed   Hepatitis C Screening  Completed   HIV Screening  Completed   HPV VACCINES  Aged Out    Health Maintenance  Health Maintenance Due  Topic Date Due   Diabetic kidney evaluation - Urine ACR  Never done   Zoster Vaccines- Shingrix (1 of 2) Never done   MAMMOGRAM  03/03/2022   COVID-19 Vaccine (6 - 2023-24 season) 06/24/2022    Colorectal cancer screening: Type of screening: Colonoscopy. Completed 09/04/2018. Repeat every 5 years  Mammogram status: Ordered 12/29/2022. Pt provided with contact info and advised to call to schedule appt.   Bone Density status: Ordered 12/29/2022. Pt provided with contact info and advised to call to schedule appt.  Lung Cancer Screening: (Low Dose CT Chest recommended if Age 65-80years, 30 pack-year currently smoking OR have quit w/in 15years.) does not qualify.   Lung Cancer Screening Referral: n/a  Additional Screening:  Hepatitis C Screening: does not qualify; Completed 08/31/2022  Vision Screening: Recommended annual ophthalmology exams for early detection of glaucoma and other  disorders of the eye. Is the patient up to date with their annual eye exam?  Yes  Who is the provider or what is the name of the office in which the patient attends annual eye exams? [email or web address]  If pt is not established with a provider, would they like to be referred to a  provider to establish care? No .   Dental Screening: Recommended annual dental exams for proper oral hygiene  Community Resource Referral / Chronic Care Management: CRR required this visit?  No   CCM required this visit?  No      Plan:     I have personally reviewed and noted the following in the patient's chart:   Medical and social history Use of alcohol, tobacco or illicit drugs  Current medications and supplements including opioid prescriptions. Patient is not currently taking opioid prescriptions. Functional ability and status Nutritional status Physical activity Advanced directives List of other physicians Hospitalizations, surgeries, and ER visits in previous 12 months Vitals Screenings to include cognitive, depression, and falls Referrals and appointments  In addition, I have reviewed and discussed with patient certain preventive protocols, quality metrics, and best practice recommendations. A written personalized care plan for preventive services as well as general preventive health recommendations were provided to patient.     Daphane Shepherd, LPN   [id number]   Nurse Notes: Will have shingrix vaccines records sent over

## 2023-01-04 DIAGNOSIS — D140 Benign neoplasm of middle ear, nasal cavity and accessory sinuses: Secondary | ICD-10-CM | POA: Diagnosis not present

## 2023-01-11 ENCOUNTER — Encounter: Payer: Self-pay | Admitting: Cardiology

## 2023-01-11 ENCOUNTER — Ambulatory Visit: Payer: Medicare Other | Attending: Cardiology | Admitting: Cardiology

## 2023-01-11 VITALS — BP 106/54 | HR 58 | Ht 63.6 in | Wt 145.0 lb

## 2023-01-11 DIAGNOSIS — E118 Type 2 diabetes mellitus with unspecified complications: Secondary | ICD-10-CM | POA: Diagnosis not present

## 2023-01-11 DIAGNOSIS — I700 Atherosclerosis of aorta: Secondary | ICD-10-CM

## 2023-01-11 DIAGNOSIS — E1169 Type 2 diabetes mellitus with other specified complication: Secondary | ICD-10-CM | POA: Diagnosis not present

## 2023-01-11 DIAGNOSIS — I10 Essential (primary) hypertension: Secondary | ICD-10-CM | POA: Diagnosis not present

## 2023-01-11 DIAGNOSIS — E785 Hyperlipidemia, unspecified: Secondary | ICD-10-CM

## 2023-01-11 DIAGNOSIS — I2510 Atherosclerotic heart disease of native coronary artery without angina pectoris: Secondary | ICD-10-CM | POA: Diagnosis not present

## 2023-01-11 DIAGNOSIS — R0609 Other forms of dyspnea: Secondary | ICD-10-CM | POA: Diagnosis not present

## 2023-01-11 NOTE — Patient Instructions (Addendum)
Medication Instructions:  Your physician recommends that you continue on your current medications as directed. Please refer to the Current Medication list given to you today.  *If you need a refill on your cardiac medications before your next appointment, please call your pharmacy*   Lab Work: Your physician recommends that you return for lab work in: the next few days for CMP and lipids. You need to have labs done when you are fasting. [facility] lab is located on the 3rd floor, [address]. Hours are Monday - Friday 8 am to 4 pm, closed 11:30 am to 1:00 pm. You do NOT need an appointment.   If you have labs (blood work) drawn today and your tests are completely normal, you will receive your results only by: [place] (if you have MyChart) OR A paper copy in the mail If you have any lab test that is abnormal or we need to change your treatment, we will call you to review the results.   Testing/Procedures: None ordered   Follow-Up: At [facility], you and your health needs are our priority.  As part of our continuing mission to provide you with exceptional heart care, we have created designated Provider Care Teams.  These Care Teams include your primary Cardiologist (physician) and Advanced Practice Providers (APPs -  Physician Assistants and Nurse Practitioners) who all work together to provide you with the care you need, when you need it.  We recommend signing up for the patient portal called "MyChart".  Sign up information is provided on this After Visit Summary.  MyChart is used to connect with patients for Virtual Visits (Telemedicine).  Patients are able to view lab/test results, encounter notes, upcoming appointments, etc.  Non-urgent messages can be sent to your provider as well.   To learn more about what you can do with MyChart, go to [email or web address].    Your next appointment:   9 month(s)  The format for your next appointment:   In Person  Provider:    Jyl Heinz, MD    Other Instructions none  Important Information About Sugar

## 2023-01-11 NOTE — Progress Notes (Signed)
Cardiology Office Note:    Date:  01/11/2023   ID:  Stacy [name], DOB [birth year], MRN [id number]  PCP:  [name], DO  Cardiologist:  Jenean Lindau, MD   Referring MD: [name], DO    ASSESSMENT:    1. Coronary artery disease involving native coronary artery of native heart without angina pectoris   2. Aortic atherosclerosis ([place])   3. Essential hypertension   4. Hyperlipidemia associated with type 2 diabetes mellitus ([place])   5. Controlled type 2 diabetes mellitus with complication, without long-term current use of insulin (HCC)    PLAN:    In order of problems listed above:  Dyspnea on exertion: Recent troponin elevation with patient being diagnosed with pneumonitis and admitted to the hospital.  Interestingly coronary angiography has revealed nonobstructive disease.  She denies any chest pain orthopnea or PND.  She is going to resume her exercise with oxygen.  I told her to make sure that her oxygenation is greater than 90% when she exercises.  She will also be assisted for this by her pulmonologist. Coronary artery disease: Stable at this time.  Coronary angiography report discussed with the patient at length and questions were answered to her satisfaction. Mixed dyslipidemia: Will do lab work in the next few days.  If lipids are not at goal we might consider adding low-dose statin.  Patient is taking injectable medications only once a month not twice a month because she cannot afford it.  She had tolerated low-dose statin well in the past. COPD on oxygen: Followed by primary.  She is very compliant with medical advice. Patient will be seen in follow-up appointment in 6 months or earlier if the patient has any concerns.    Medication Adjustments/Labs and Tests Ordered: Current medicines are reviewed at length with the patient today.  Concerns regarding medicines are outlined above.  No orders of the defined types were placed in this encounter.  No orders  of the defined types were placed in this encounter.    No chief complaint on file.    History of Present Illness:    Stacy [name] is a 67 y.o. female.  Patient has past medical history of coronary artery disease, essential hypertension, dyslipidemia..  Patient has past medical history of recently being admitted to the hospital for pneumonitis.  She has COPD.  She is on oxygen.  She denies any chest pain orthopnea or PND.  She is seeing her pulmonologist for dyspnea.  At the time of my evaluation, the patient is alert awake oriented and in no distress.  Past Medical History:  Diagnosis Date   Acute on chronic respiratory failure ([place]) 12/09/2022   Allergic rhinitis 02/25/2008   Qualifier: Diagnosis of  By: Valetta Close DO, Karen     Angina pectoris ([place]) 06/09/2021   Aortic atherosclerosis ([place]) 08/14/2017   Appreciated on CXR 08/14/2017   Asthma    Asthmatic bronchitis 12/07/2021   Atrophy of vagina 12/07/2021   CAP (community acquired pneumonia) 12/08/2022   Centrilobular emphysema ([place]) 05/28/2018   Controlled diabetes mellitus type 2 with complications ([place]) 123XX123   Has FLD.   COPD with acute exacerbation ([place]) 11/02/2007   Formatting of this note might be different from the original.  PFT 05/28/2019           ratio 50% FEV1 53% FVC 83%    DLCO 54%  PFT 09/25/18 ([facility]) ratio 52% FEV1 56% FVC 108%; DLCO 69%   Coronary artery disease  Dysphagia 12/07/2021   Dyspnea on exertion 08/21/2018   Elevated LFTs 12/11/2022   Essential hypertension 12/07/2021   Facet arthropathy, lumbar 12/15/2021   Family history of osteoporosis 04/22/2019   Family history of rheumatoid arthritis 06/05/2019   Fatty liver disease, nonalcoholic    Ganglion of hand 12/07/2021   GERD 11/02/2007   Qualifier: Diagnosis of  By: Valetta Close DO, Karen     Hemoptysis 12/08/2022   Hemorrhoids 12/07/2021   Hyperlipidemia associated with type 2 diabetes mellitus ([place]) 03/24/2020   Hypersomnia with sleep apnea  05/28/2018   HYPERTENSION, BENIGN 12/24/2008   Qualifier: Diagnosis of  By: Stanford Breed, MD, Kandyce Rud    Hypothyroidism 06/13/2007   ? Hashimotos.  Has strong family hx autoimmune d/o No h/o thyroidectomy or radioablation.    IBS (irritable bowel syndrome)    Insomnia- related to menopause 05/28/2018   Lactic acid increased 12/09/2022   Lateral epicondylitis of left elbow 06/05/2019   Lung disease, bullous ([place]) 08/14/2017   Menopause 12/07/2021   Obesity (BMI 30.0-34.9) 05/28/2018   Osteopenia of multiple sites 12/15/2021   Overactive bladder 06/09/2017   Pain in female genitalia on intercourse 12/07/2021   Palpitations 08/03/2022   Polyp of colon 12/07/2021   Post-menopausal 04/22/2019   Precordial pain    Sees Dr Stanford Breed.  Last cardiac cath 2016 = negative. Strong family h/o MI and CAD   Psychophysiological insomnia 05/28/2018   Reactive depression 06/26/2018   Rosacea 08/01/2007   Qualifier: Diagnosis of  By: Valetta Close DO, Karen     Snoring 05/28/2018   Stage 2 moderate COPD by GOLD classification ([place]) 11/02/2007   Formatting of this note might be different from the original. PFT 05/28/2019           ratio 50% FEV1 53% FVC 83%    DLCO 54% PFT 09/25/18 ([facility]) ratio 52% FEV1 56% FVC 108%; DLCO 69%   Statin intolerance 04/10/2019   Stress incontinence in female 06/09/2017   Type 2 MI (myocardial infarction) ([place]) 12/08/2022   UTI'S, RECURRENT 06/13/2007   Qualifier: Diagnosis of  By: Esmeralda Arthur     Vitamin D insufficiency 12/15/2021    Past Surgical History:  Procedure Laterality Date   ABLATION     CARDIAC CATHETERIZATION N/A 06/12/2015   Procedure: Left Heart Cath and Coronary Angiography;  Surgeon: Burnell Blanks, MD;  Location: [facility];  Service: Cardiovascular;  Laterality: N/A;   CESAREAN SECTION     LTCS     x2   NASAL SINUS SURGERY     SPIROMETRY  01/05/2007   FVC 68% predicted; FEV1 44% predicted; FEV1/FVC 63% predicted = moderate  obstruction with low vital capacity possibly due to restriction   SQUAMOUS CELL CARCINOMA EXCISION     TUBAL LIGATION      Current Medications: Current Meds  Medication Sig   albuterol (PROVENTIL) (2.5 MG/3ML) 0.083% nebulizer solution Take 3 mLs (2.5 mg total) by nebulization every 6 (six) hours as needed for wheezing or shortness of breath.   albuterol (VENTOLIN HFA) 108 (90 Base) MCG/ACT inhaler Inhale 2 puffs into the lungs every 6 (six) hours as needed for wheezing or shortness of breath.   ANORO ELLIPTA 62.5-25 MCG/ACT AEPB USE 1 INHALATION BY MOUTH ONCE  DAILY AT THE SAME TIME EACH DAY   benzonatate (TESSALON) 100 MG capsule Take 1 capsule (100 mg total) by mouth every 8 (eight) hours.   Evolocumab (REPATHA) 140 MG/ML SOSY Inject 140 mg into the skin every  14 (fourteen) days.   FLUoxetine (PROZAC) 20 MG capsule Take 20 mg by mouth daily.   furosemide (LASIX) 20 MG tablet Take 20 mg by mouth daily.   guaiFENesin (MUCINEX) 600 MG 12 hr tablet Take 1,200 mg by mouth 2 (two) times daily as needed for cough.   levothyroxine (SYNTHROID) 75 MCG tablet Take 1 tablet (75 mcg total) by mouth daily.   loratadine (CLARITIN) 10 MG tablet Take 10 mg by mouth daily.   metoprolol succinate (TOPROL XL) 25 MG 24 hr tablet Take 1 tablet (25 mg total) by mouth daily.   Omeprazole-Sodium Bicarbonate (ZEGERID) 20-1100 MG CAPS capsule Take 1 capsule by mouth at bedtime.   Vitamin D, Ergocalciferol, (DRISDOL) 1.25 MG (50000 UNIT) CAPS capsule Take 1 capsule (50,000 Units total) by mouth every 7 (seven) days.   zolpidem (AMBIEN) 5 MG tablet Take 1 tablet (5 mg total) by mouth at bedtime as needed for sleep.     Allergies:   Avelox [moxifloxacin hcl in nacl], Ciprofloxacin, Livalo [pitavastatin], Metformin and related, Moxifloxacin, Bactrim [sulfamethoxazole-trimethoprim], Statins, and Sulfamethoxazole-trimethoprim   Social History   Socioeconomic History   Marital status: Married    Spouse name: Not on  file   Number of children: 2   Years of education: Not on file   Highest education level: Associate degree: academic program  Occupational History   Occupation: Works in Science writer   Occupation: Disabled  Tobacco Use   Smoking status: Former    Packs/day: 0.50    Years: 30.00    Additional pack years: 0.00    Total pack years: 15.00    Types: Cigarettes    Quit date: 10/25/2007    Years since quitting: 15.2   Smokeless tobacco: Never  Vaping Use   Vaping Use: Never used  Substance and Sexual Activity   Alcohol use: Yes    Alcohol/week: 1.0 standard drink of alcohol    Types: 1 Glasses of wine per week    Comment: Occasional-once a year   Drug use: No   Sexual activity: Yes    Birth control/protection: Surgical  Other Topics Concern   Not on file  Social History Narrative   Remarried.    Previous occupation: Customer service manager.       Moved from [place] [facility] in [place]   2 children, has stepchildren and grandchildren   Does not exercise   Social Determinants of Health   Financial Resource Strain: Low Risk  (12/29/2022)   Overall Financial Resource Strain (CARDIA)    Difficulty of Paying Living Expenses: Not hard at all  Food Insecurity: No Food Insecurity (12/29/2022)   Hunger Vital Sign    Worried About Running Out of Food in the Last Year: Never true    Ran Out of Food in the Last Year: Never true  Transportation Needs: No Transportation Needs (12/29/2022)   PRAPARE - Hydrologist (Medical): No    Lack of Transportation (Non-Medical): No  Physical Activity: Inactive (12/29/2022)   Exercise Vital Sign    Days of Exercise per Week: 0 days    Minutes of Exercise per Session: 0 min  Stress: No Stress Concern Present (12/29/2022)   [place]    Feeling of Stress : Not at all  Social Connections: Moderately Integrated (12/29/2022)   Social Connection and Isolation Panel [NHANES]    Frequency of Communication  with Friends and Family: More than three times a week    Frequency  of Social Gatherings with Friends and Family: More than three times a week    Attends Religious Services: 1 to 4 times per year    Active Member of [employer] or Organizations: No    Attends Music therapist: Never    Marital Status: Married     Family History: The patient's family history includes CAD in her brother; Diabetes in her maternal grandmother; Heart disease in her father and mother; Heart disease (age of onset: 34) in her sister; Hyperlipidemia in her father; Hypertension in her father; Hypothyroidism in her father and mother; Macular degeneration in her mother; Pulmonary fibrosis in her brother; Raynaud syndrome in her sister; Rheum arthritis in her brother and sister; Thyroid disease in her sister and sister. There is no history of Breast cancer, Ovarian cancer, Prostate cancer, Colon cancer, Stomach cancer, or Esophageal cancer.  ROS:   Please see the history of present illness.    All other systems reviewed and are negative.  EKGs/Labs/Other Studies Reviewed:    The following studies were reviewed today: I discussed my findings with the patient at length.   Recent Labs: 12/08/2022: B Natriuretic Peptide 190.0 12/09/2022: Hemoglobin 15.2; Magnesium 2.8; Platelets 306; TSH 1.173 12/10/2022: ALT 37; BUN 16; Creatinine, Ser 0.85; Potassium 4.8; Sodium 138  Recent Lipid Panel    Component Value Date/Time   CHOL 130 08/03/2022 1342   TRIG 184 (H) 08/03/2022 1342   HDL 47 08/03/2022 1342   CHOLHDL 2.8 08/03/2022 1342   CHOLHDL 3.3 Ratio 08/05/2009 2054   VLDL 21 08/05/2009 2054   LDLCALC 53 08/03/2022 1342    Physical Exam:    VS:  BP (!) 106/54   Pulse (!) 58   Ht 5' 3.6" (1.615 m)   Wt 145 lb 0.6 oz (65.8 kg)   SpO2 96%   BMI 25.21 kg/m     Wt Readings from Last 3 Encounters:  01/11/23 145 lb 0.6 oz (65.8 kg)  12/29/22 141 lb (64 kg)  12/12/22 146 lb 3.2 oz (66.3 kg)     GEN:  Patient is in no acute distress HEENT: Normal NECK: No JVD; No carotid bruits LYMPHATICS: No lymphadenopathy CARDIAC: Hear sounds regular, 2/6 systolic murmur at the apex. RESPIRATORY:  Clear to auscultation without rales, wheezing or rhonchi  ABDOMEN: Soft, non-tender, non-distended MUSCULOSKELETAL:  No edema; No deformity  SKIN: Warm and dry NEUROLOGIC:  Alert and oriented x 3 PSYCHIATRIC:  Normal affect   Signed, Jenean Lindau, MD  01/11/2023 1:30 PM    [place]

## 2023-01-13 DIAGNOSIS — E1169 Type 2 diabetes mellitus with other specified complication: Secondary | ICD-10-CM | POA: Diagnosis not present

## 2023-01-13 DIAGNOSIS — L139 Bullous disorder, unspecified: Secondary | ICD-10-CM | POA: Diagnosis not present

## 2023-01-13 DIAGNOSIS — I700 Atherosclerosis of aorta: Secondary | ICD-10-CM | POA: Diagnosis not present

## 2023-01-13 DIAGNOSIS — I2510 Atherosclerotic heart disease of native coronary artery without angina pectoris: Secondary | ICD-10-CM | POA: Diagnosis not present

## 2023-01-13 DIAGNOSIS — E785 Hyperlipidemia, unspecified: Secondary | ICD-10-CM | POA: Diagnosis not present

## 2023-01-13 DIAGNOSIS — J9601 Acute respiratory failure with hypoxia: Secondary | ICD-10-CM | POA: Diagnosis not present

## 2023-01-13 DIAGNOSIS — J432 Centrilobular emphysema: Secondary | ICD-10-CM | POA: Diagnosis not present

## 2023-01-13 DIAGNOSIS — J449 Chronic obstructive pulmonary disease, unspecified: Secondary | ICD-10-CM | POA: Diagnosis not present

## 2023-01-14 LAB — COMPREHENSIVE METABOLIC PANEL
ALT: 36 IU/L — ABNORMAL HIGH (ref 0–32)
AST: 27 IU/L (ref 0–40)
Albumin/Globulin Ratio: 1.8 (ref 1.2–2.2)
Albumin: 4.2 g/dL (ref 3.9–4.9)
Alkaline Phosphatase: 98 IU/L (ref 44–121)
BUN/Creatinine Ratio: 13 (ref 12–28)
BUN: 12 mg/dL (ref 8–27)
Bilirubin Total: 0.6 mg/dL (ref 0.0–1.2)
CO2: 22 mmol/L (ref 20–29)
Calcium: 9.2 mg/dL (ref 8.7–10.3)
Chloride: 105 mmol/L (ref 96–106)
Creatinine, Ser: 0.9 mg/dL (ref 0.57–1.00)
Globulin, Total: 2.3 g/dL (ref 1.5–4.5)
Glucose: 133 mg/dL — ABNORMAL HIGH (ref 70–99)
Potassium: 4.5 mmol/L (ref 3.5–5.2)
Sodium: 142 mmol/L (ref 134–144)
Total Protein: 6.5 g/dL (ref 6.0–8.5)
eGFR: 71 mL/min/{1.73_m2} (ref >59)

## 2023-01-14 LAB — LIPID PANEL
Chol/HDL Ratio: 2.6 ratio (ref 0.0–4.4)
Cholesterol, Total: 119 mg/dL (ref 100–199)
HDL: 45 mg/dL (ref >39)
LDL Chol Calc (NIH): 50 mg/dL (ref 0–99)
Triglycerides: 135 mg/dL (ref 0–149)
VLDL Cholesterol Cal: 24 mg/dL (ref 5–40)

## 2023-01-18 ENCOUNTER — Ambulatory Visit: Payer: Medicare Other | Admitting: Emergency Medicine

## 2023-01-18 ENCOUNTER — Encounter: Payer: Self-pay | Admitting: Emergency Medicine

## 2023-01-18 VITALS — BP 126/72 | HR 58 | Temp 98.7°F | Ht 63.5 in | Wt 146.0 lb

## 2023-01-18 DIAGNOSIS — J449 Chronic obstructive pulmonary disease, unspecified: Secondary | ICD-10-CM

## 2023-01-18 DIAGNOSIS — J9611 Chronic respiratory failure with hypoxia: Secondary | ICD-10-CM

## 2023-01-18 NOTE — Progress Notes (Signed)
Subjective:    Patient ID: Stacy [name], female    DOB: Jan 05, 1956, 67 y.o.   MRN: [id number]  Shortness of Breath Pertinent negatives include no headaches, leg swelling or wheezing.    ROV 07/07/2022 --67 year old woman with very severe COPD and a positive bronchodilator response, chronic cough and in the setting of this as well as allergic rhinitis and GERD.   She returns today reporting that she had been doing well, had been going to exercise at the [facility]. She has since noticed significant fatigue with exercise, has seen tachycardia to > 100 and has correlated this with desaturations on RA. No clear precipitating event like a URI She had an ambulatory oximetry 03/01/2022 in our office that did not show desaturation.  Subsequently she has been placed on supplemental oxygen 06/08/2022 by her PCP (86% on room air with ambulation), but is using prn (rarely). She is using albuterol a few times a day (an increase). No wheeze, stable chest tightness.   ROV 01/18/23 --67 year old woman with very severe COPD and a positive bronchodilator response.  She has associated chronic cough with allergic rhinitis and GERD.  Documented desaturation in our office on 12/12/2022, titrated to 2 L/min.  She was unfortunately hospitalized in mid February for an acute exacerbation and possible associated pneumonia.  She had been treated with Levaquin and prednisone as an outpatient but she failed to respond and required admission.  Today she reports that she is feeling better. She is using oxygen with heavier exertion. Follows her SpO2. She averages 2 flares a year. Has not felt as good on Trelegy or Breztri.    Review of Systems  HENT:  Positive for congestion. Negative for postnasal drip and sinus pressure.   Respiratory:  Negative for cough, chest tightness, shortness of breath and wheezing.   Cardiovascular:  Negative for palpitations and leg swelling.  Allergic/Immunologic: Negative.   Neurological:  Negative for  headaches.  Psychiatric/Behavioral:  The patient is not nervous/anxious.     Past Medical History:  Diagnosis Date   Acute on chronic respiratory failure ([place]) 12/09/2022   Allergic rhinitis 02/25/2008   Qualifier: Diagnosis of  By: Valetta Close DO, Karen     Angina pectoris ([place]) 06/09/2021   Aortic atherosclerosis ([place]) 08/14/2017   Appreciated on CXR 08/14/2017   Asthma    Asthmatic bronchitis 12/07/2021   Atrophy of vagina 12/07/2021   CAP (community acquired pneumonia) 12/08/2022   Centrilobular emphysema ([place]) 05/28/2018   Controlled diabetes mellitus type 2 with complications ([place]) 123XX123   Has FLD.   COPD with acute exacerbation ([place]) 11/02/2007   Formatting of this note might be different from the original.  PFT 05/28/2019           ratio 50% FEV1 53% FVC 83%    DLCO 54%  PFT 09/25/18 ([facility]) ratio 52% FEV1 56% FVC 108%; DLCO 69%   Coronary artery disease    Dysphagia 12/07/2021   Dyspnea on exertion 08/21/2018   Elevated LFTs 12/11/2022   Essential hypertension 12/07/2021   Facet arthropathy, lumbar 12/15/2021   Family history of osteoporosis 04/22/2019   Family history of rheumatoid arthritis 06/05/2019   Fatty liver disease, nonalcoholic    Ganglion of hand 12/07/2021   GERD 11/02/2007   Qualifier: Diagnosis of  By: Valetta Close DO, Karen     Hemoptysis 12/08/2022   Hemorrhoids 12/07/2021   Hyperlipidemia associated with type 2 diabetes mellitus ([place]) 03/24/2020   Hypersomnia with sleep apnea 05/28/2018   HYPERTENSION, BENIGN 12/24/2008  Qualifier: Diagnosis of  By: Stanford Breed, MD, Leonidas Romberg Pauline Good    Hypothyroidism 06/13/2007   ? Hashimotos.  Has strong family hx autoimmune d/o No h/o thyroidectomy or radioablation.    IBS (irritable bowel syndrome)    Insomnia- related to menopause 05/28/2018   Lactic acid increased 12/09/2022   Lateral epicondylitis of left elbow 06/05/2019   Lung disease, bullous ([place]) 08/14/2017   Menopause 12/07/2021   Obesity (BMI 30.0-34.9)  05/28/2018   Osteopenia of multiple sites 12/15/2021   Overactive bladder 06/09/2017   Pain in female genitalia on intercourse 12/07/2021   Palpitations 08/03/2022   Polyp of colon 12/07/2021   Post-menopausal 04/22/2019   Precordial pain    Sees Dr Stanford Breed.  Last cardiac cath 2016 = negative. Strong family h/o MI and CAD   Psychophysiological insomnia 05/28/2018   Reactive depression 06/26/2018   Rosacea 08/01/2007   Qualifier: Diagnosis of  By: Valetta Close DO, Karen     Snoring 05/28/2018   Stage 2 moderate COPD by GOLD classification ([place]) 11/02/2007   Formatting of this note might be different from the original. PFT 05/28/2019           ratio 50% FEV1 53% FVC 83%    DLCO 54% PFT 09/25/18 ([facility]) ratio 52% FEV1 56% FVC 108%; DLCO 69%   Statin intolerance 04/10/2019   Stress incontinence in female 06/09/2017   Type 2 MI (myocardial infarction) ([place]) 12/08/2022   UTI'S, RECURRENT 06/13/2007   Qualifier: Diagnosis of  By: Valetta Close DO, Santiago Glad     Vitamin D insufficiency 12/15/2021     Family History  Problem Relation Age of Onset   Hypertension Father    Hyperlipidemia Father    Heart disease Father        Died at age 51 of MI   Hypothyroidism Father    Diabetes Maternal Grandmother    Raynaud syndrome Sister    Rheum arthritis Sister    Thyroid disease Sister    CAD Brother        has 4 stents   Rheum arthritis Brother    Pulmonary fibrosis Brother    Heart disease Sister 57       h/o MI @age  66   Thyroid disease Sister    Heart disease Mother    Hypothyroidism Mother    Macular degeneration Mother    Breast cancer Neg Hx    Ovarian cancer Neg Hx    Prostate cancer Neg Hx    Colon cancer Neg Hx    Stomach cancer Neg Hx    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  No other occupational exposure.  Has lived in [place] and [place] No military   Allergies  Allergen Reactions   Avelox [Moxifloxacin Hcl In Nacl] Hives   Ciprofloxacin     REACTION:  Rash   Livalo [Pitavastatin]    Metformin And Related     kidney   Moxifloxacin     REACTION: RASH   Bactrim [Sulfamethoxazole-Trimethoprim] Rash   Statins Other (See Comments)    myalgias   Sulfamethoxazole-Trimethoprim Rash    REACTION: Rash     Outpatient Medications Prior to Visit  Medication Sig Dispense Refill   albuterol (PROVENTIL) (2.5 MG/3ML) 0.083% nebulizer solution Take 3 mLs (2.5 mg total) by nebulization every 6 (six) hours as needed for wheezing or shortness of breath. 75 mL 12   albuterol (VENTOLIN HFA) 108 (90 Base) MCG/ACT inhaler Inhale 2 puffs into  the lungs every 6 (six) hours as needed for wheezing or shortness of breath. 8 g 2   ANORO ELLIPTA 62.5-25 MCG/ACT AEPB USE 1 INHALATION BY MOUTH ONCE  DAILY AT THE SAME TIME EACH DAY 180 each 3   benzonatate (TESSALON) 100 MG capsule Take 1 capsule (100 mg total) by mouth every 8 (eight) hours. 21 capsule 0   Evolocumab (REPATHA) 140 MG/ML SOSY Inject 140 mg into the skin every 14 (fourteen) days. 6 mL 0   FLUoxetine (PROZAC) 20 MG capsule Take 20 mg by mouth daily.     furosemide (LASIX) 20 MG tablet Take 20 mg by mouth daily.     guaiFENesin (MUCINEX) 600 MG 12 hr tablet Take 1,200 mg by mouth 2 (two) times daily as needed for cough.     levothyroxine (SYNTHROID) 75 MCG tablet Take 1 tablet (75 mcg total) by mouth daily. 100 tablet 3   loratadine (CLARITIN) 10 MG tablet Take 10 mg by mouth daily.     metoprolol succinate (TOPROL XL) 25 MG 24 hr tablet Take 1 tablet (25 mg total) by mouth daily. 90 tablet 3   Omeprazole-Sodium Bicarbonate (ZEGERID) 20-1100 MG CAPS capsule Take 1 capsule by mouth at bedtime.     Vitamin D, Ergocalciferol, (DRISDOL) 1.25 MG (50000 UNIT) CAPS capsule Take 1 capsule (50,000 Units total) by mouth every 7 (seven) days. 12 capsule 3   zolpidem (AMBIEN) 5 MG tablet Take 1 tablet (5 mg total) by mouth at bedtime as needed for sleep. 30 tablet 0   No facility-administered medications prior to  visit.        Objective:   Physical Exam Vitals:   01/18/23 1319  BP: 126/72  Pulse: (!) 58  Temp: 98.7 F (37.1 C)  TempSrc: Oral  SpO2: 97%  Weight: 146 lb (66.2 kg)  Height: 5' 3.5" (1.613 m)   Gen: Pleasant, well-nourished, in no distress,  normal affect  ENT: No lesions,  mouth clear,  oropharynx clear, no postnasal drip  Neck: No JVD, no stridor  Lungs: No use of accessory muscles, clear without rales or rhonchi on a normal breath.  End expiratory squeak on forced expiration  Cardiovascular: Regular, no evidence of A-fib, heart sounds normal, no murmur or gallops, no peripheral edema  Musculoskeletal: No deformities, no cyanosis or clubbing  Neuro: alert, non focal  Skin: Warm, no lesions or rash       Assessment & Plan:  COPD (chronic obstructive pulmonary disease) (HCC) COPD with bullous disease.  She is been evaluated at [facility] for possible endobronchial valve placement.  It was felt that this was not indicated at least at that time.  She was admitted with a severe exacerbation and pneumonia in February, now improved.  She has averaged 1-2 exacerbations annually and likely will need to ultimately be on an inhaled steroid.  We have tried changing her Anoro to [employer] and also Trelegy but she never did as well on these medications.  If she flares again this year then I will try adding an ICS to the Anoro, possibly Arnuity or other.  She will continue to work on her exercise and conditioning, knows to use her supplemental oxygen with heavier exertion.  Chronic respiratory failure (HCC) With hypoxia on heavy exertion.  She has an Inogen, uses 2 L/min pulsed when needed.  Follows her saturations, knows her goal is greater than 90%.  Baltazar Apo, MD, PhD 01/18/2023, 1:44 PM [facility] [phone] or if no answer  [phone]  

## 2023-01-18 NOTE — Patient Instructions (Addendum)
Please continue your Anoro once daily. Keep albuterol available to use 2 puffs up to every 4 hours if needed for shortness of breath, chest tightness, wheezing.  Use your oxygen at 2 L/min with heavy exertion.  Our goal is to keep your saturations > 90%. Please call our office if you develop any breathing difficulty, new respiratory symptoms. Continue loratadine 10 mg once daily Follow with Dr Lamonte Sakai in 6 months or sooner if you have any problems

## 2023-01-18 NOTE — Assessment & Plan Note (Signed)
With hypoxia on heavy exertion.  She has an Inogen, uses 2 L/min pulsed when needed.  Follows her saturations, knows her goal is greater than 90%.

## 2023-01-18 NOTE — Assessment & Plan Note (Signed)
COPD with bullous disease.  She is been evaluated at [facility] for possible endobronchial valve placement.  It was felt that this was not indicated at least at that time.  She was admitted with a severe exacerbation and pneumonia in February, now improved.  She has averaged 1-2 exacerbations annually and likely will need to ultimately be on an inhaled steroid.  We have tried changing her Anoro to [employer] and also Trelegy but she never did as well on these medications.  If she flares again this year then I will try adding an ICS to the Anoro, possibly Arnuity or other.  She will continue to work on her exercise and conditioning, knows to use her supplemental oxygen with heavier exertion.

## 2023-02-13 DIAGNOSIS — I700 Atherosclerosis of aorta: Secondary | ICD-10-CM | POA: Diagnosis not present

## 2023-02-13 DIAGNOSIS — M47816 Spondylosis without myelopathy or radiculopathy, lumbar region: Secondary | ICD-10-CM | POA: Diagnosis not present

## 2023-02-13 DIAGNOSIS — J449 Chronic obstructive pulmonary disease, unspecified: Secondary | ICD-10-CM | POA: Diagnosis not present

## 2023-02-13 DIAGNOSIS — J9601 Acute respiratory failure with hypoxia: Secondary | ICD-10-CM | POA: Diagnosis not present

## 2023-02-13 DIAGNOSIS — J432 Centrilobular emphysema: Secondary | ICD-10-CM | POA: Diagnosis not present

## 2023-02-13 DIAGNOSIS — L139 Bullous disorder, unspecified: Secondary | ICD-10-CM | POA: Diagnosis not present

## 2023-03-06 ENCOUNTER — Ambulatory Visit (INDEPENDENT_AMBULATORY_CARE_PROVIDER_SITE_OTHER): Payer: Medicare Other

## 2023-03-06 ENCOUNTER — Other Ambulatory Visit: Payer: Self-pay | Admitting: Family Medicine

## 2023-03-06 ENCOUNTER — Ambulatory Visit
Admission: RE | Admit: 2023-03-06 | Discharge: 2023-03-06 | Disposition: A | Discharge status: Home / Self Care | Payer: Medicare Other | Source: Ambulatory Visit | Attending: Family Medicine | Admitting: Family Medicine

## 2023-03-06 DIAGNOSIS — M8588 Other specified disorders of bone density and structure, other site: Secondary | ICD-10-CM | POA: Diagnosis not present

## 2023-03-06 DIAGNOSIS — Z780 Asymptomatic menopausal state: Secondary | ICD-10-CM

## 2023-03-06 DIAGNOSIS — Z1231 Encounter for screening mammogram for malignant neoplasm of breast: Secondary | ICD-10-CM

## 2023-03-06 DIAGNOSIS — Z0000 Encounter for general adult medical examination without abnormal findings: Secondary | ICD-10-CM

## 2023-03-10 ENCOUNTER — Other Ambulatory Visit: Payer: Self-pay | Admitting: Family Medicine

## 2023-03-10 DIAGNOSIS — R928 Other abnormal and inconclusive findings on diagnostic imaging of breast: Secondary | ICD-10-CM

## 2023-03-14 NOTE — Progress Notes (Signed)
Patient has appt on 03/17/2023

## 2023-03-15 DIAGNOSIS — J9601 Acute respiratory failure with hypoxia: Secondary | ICD-10-CM | POA: Diagnosis not present

## 2023-03-15 DIAGNOSIS — I700 Atherosclerosis of aorta: Secondary | ICD-10-CM | POA: Diagnosis not present

## 2023-03-15 DIAGNOSIS — L139 Bullous disorder, unspecified: Secondary | ICD-10-CM | POA: Diagnosis not present

## 2023-03-15 DIAGNOSIS — J449 Chronic obstructive pulmonary disease, unspecified: Secondary | ICD-10-CM | POA: Diagnosis not present

## 2023-03-15 DIAGNOSIS — M47816 Spondylosis without myelopathy or radiculopathy, lumbar region: Secondary | ICD-10-CM | POA: Diagnosis not present

## 2023-03-15 DIAGNOSIS — J432 Centrilobular emphysema: Secondary | ICD-10-CM | POA: Diagnosis not present

## 2023-03-17 ENCOUNTER — Ambulatory Visit
Admission: RE | Admit: 2023-03-17 | Discharge: 2023-03-17 | Disposition: A | Discharge status: Home / Self Care | Payer: Medicare Other | Source: Ambulatory Visit | Attending: Family Medicine | Admitting: Family Medicine

## 2023-03-17 ENCOUNTER — Ambulatory Visit: Payer: Medicare Other

## 2023-03-17 DIAGNOSIS — R92321 Mammographic fibroglandular density, right breast: Secondary | ICD-10-CM | POA: Diagnosis not present

## 2023-03-17 DIAGNOSIS — R922 Inconclusive mammogram: Secondary | ICD-10-CM | POA: Diagnosis not present

## 2023-03-17 DIAGNOSIS — R928 Other abnormal and inconclusive findings on diagnostic imaging of breast: Secondary | ICD-10-CM

## 2023-03-29 ENCOUNTER — Other Ambulatory Visit (HOSPITAL_COMMUNITY): Payer: Self-pay

## 2023-03-29 ENCOUNTER — Other Ambulatory Visit: Payer: Self-pay

## 2023-04-07 ENCOUNTER — Other Ambulatory Visit: Payer: Self-pay | Admitting: Family Medicine

## 2023-04-12 DIAGNOSIS — J322 Chronic ethmoidal sinusitis: Secondary | ICD-10-CM | POA: Diagnosis not present

## 2023-04-12 DIAGNOSIS — J342 Deviated nasal septum: Secondary | ICD-10-CM | POA: Diagnosis not present

## 2023-04-12 DIAGNOSIS — D140 Benign neoplasm of middle ear, nasal cavity and accessory sinuses: Secondary | ICD-10-CM | POA: Diagnosis not present

## 2023-04-12 DIAGNOSIS — M47816 Spondylosis without myelopathy or radiculopathy, lumbar region: Secondary | ICD-10-CM | POA: Diagnosis not present

## 2023-04-12 DIAGNOSIS — J310 Chronic rhinitis: Secondary | ICD-10-CM | POA: Diagnosis not present

## 2023-04-15 DIAGNOSIS — L139 Bullous disorder, unspecified: Secondary | ICD-10-CM | POA: Diagnosis not present

## 2023-04-15 DIAGNOSIS — J9601 Acute respiratory failure with hypoxia: Secondary | ICD-10-CM | POA: Diagnosis not present

## 2023-04-15 DIAGNOSIS — J449 Chronic obstructive pulmonary disease, unspecified: Secondary | ICD-10-CM | POA: Diagnosis not present

## 2023-04-15 DIAGNOSIS — J432 Centrilobular emphysema: Secondary | ICD-10-CM | POA: Diagnosis not present

## 2023-04-15 DIAGNOSIS — I700 Atherosclerosis of aorta: Secondary | ICD-10-CM | POA: Diagnosis not present

## 2023-04-20 DIAGNOSIS — M47816 Spondylosis without myelopathy or radiculopathy, lumbar region: Secondary | ICD-10-CM | POA: Diagnosis not present

## 2023-05-02 ENCOUNTER — Other Ambulatory Visit: Payer: Self-pay

## 2023-05-02 MED ORDER — METOPROLOL SUCCINATE ER 25 MG PO TB24: 25.0000 mg | ORAL_TABLET | Freq: Every day | ORAL | 1 refills | Status: DC

## 2023-05-15 DIAGNOSIS — J432 Centrilobular emphysema: Secondary | ICD-10-CM | POA: Diagnosis not present

## 2023-05-15 DIAGNOSIS — J9601 Acute respiratory failure with hypoxia: Secondary | ICD-10-CM | POA: Diagnosis not present

## 2023-05-15 DIAGNOSIS — L139 Bullous disorder, unspecified: Secondary | ICD-10-CM | POA: Diagnosis not present

## 2023-05-15 DIAGNOSIS — J449 Chronic obstructive pulmonary disease, unspecified: Secondary | ICD-10-CM | POA: Diagnosis not present

## 2023-05-15 DIAGNOSIS — I700 Atherosclerosis of aorta: Secondary | ICD-10-CM | POA: Diagnosis not present

## 2023-05-24 ENCOUNTER — Ambulatory Visit: Payer: Medicare Other | Admitting: Family Medicine

## 2023-05-24 ENCOUNTER — Encounter: Payer: Self-pay | Admitting: Emergency Medicine

## 2023-05-26 MED ORDER — DOXYCYCLINE HYCLATE 100 MG PO TABS: 100.0000 mg | ORAL_TABLET | Freq: Two times a day (BID) | ORAL | 0 refills | Status: DC

## 2023-05-30 ENCOUNTER — Other Ambulatory Visit: Payer: Self-pay | Admitting: Cardiology

## 2023-05-30 DIAGNOSIS — E7800 Pure hypercholesterolemia, unspecified: Secondary | ICD-10-CM

## 2023-05-30 DIAGNOSIS — E1169 Type 2 diabetes mellitus with other specified complication: Secondary | ICD-10-CM

## 2023-05-30 DIAGNOSIS — I700 Atherosclerosis of aorta: Secondary | ICD-10-CM

## 2023-05-30 DIAGNOSIS — E782 Mixed hyperlipidemia: Secondary | ICD-10-CM

## 2023-05-30 MED ORDER — REPATHA SURECLICK 140 MG/ML ~~LOC~~ SOAJ: 140.0000 mg | SUBCUTANEOUS | 11 refills | Status: AC

## 2023-06-07 ENCOUNTER — Encounter: Payer: Self-pay | Admitting: Family Medicine

## 2023-06-07 ENCOUNTER — Ambulatory Visit: Payer: Medicare Other | Admitting: Family Medicine

## 2023-06-07 VITALS — BP 108/70 | HR 58 | Temp 97.9°F | Ht 63.5 in | Wt 147.0 lb

## 2023-06-07 DIAGNOSIS — E785 Hyperlipidemia, unspecified: Secondary | ICD-10-CM | POA: Diagnosis not present

## 2023-06-07 DIAGNOSIS — J432 Centrilobular emphysema: Secondary | ICD-10-CM | POA: Diagnosis not present

## 2023-06-07 DIAGNOSIS — E1169 Type 2 diabetes mellitus with other specified complication: Secondary | ICD-10-CM | POA: Diagnosis not present

## 2023-06-07 DIAGNOSIS — F329 Major depressive disorder, single episode, unspecified: Secondary | ICD-10-CM

## 2023-06-07 DIAGNOSIS — E039 Hypothyroidism, unspecified: Secondary | ICD-10-CM

## 2023-06-07 DIAGNOSIS — I152 Hypertension secondary to endocrine disorders: Secondary | ICD-10-CM | POA: Diagnosis not present

## 2023-06-07 DIAGNOSIS — Z6379 Other stressful life events affecting family and household: Secondary | ICD-10-CM | POA: Diagnosis not present

## 2023-06-07 DIAGNOSIS — E119 Type 2 diabetes mellitus without complications: Secondary | ICD-10-CM

## 2023-06-07 DIAGNOSIS — E1159 Type 2 diabetes mellitus with other circulatory complications: Secondary | ICD-10-CM

## 2023-06-07 DIAGNOSIS — M609 Myositis, unspecified: Secondary | ICD-10-CM | POA: Diagnosis not present

## 2023-06-07 LAB — BAYER DCA HB A1C WAIVED: HB A1C (BAYER DCA - WAIVED): 6.5 % — ABNORMAL HIGH (ref 4.8–5.6)

## 2023-06-07 MED ORDER — DESVENLAFAXINE SUCCINATE ER 50 MG PO TB24: 50.0000 mg | ORAL_TABLET | Freq: Every day | ORAL | 3 refills | Status: DC

## 2023-06-07 NOTE — Progress Notes (Signed)
Subjective: CC:DM PCP: Stacy Ip, DO Stacy [name] is a 67 y.o. female presenting to clinic today for:  1. Type 2 Diabetes with hypertension, hyperlipidemia:  Diabetes has been diet controlled.  She tries to stay physically active.  She is wearing oxygen with exercise/activity.  She continues to see pulmonology, cardiology as directed.  She is compliant with Repatha once monthly, metoprolol  Diabetes Health Maintenance Due  Topic Date Due   FOOT EXAM  04/19/2023   HEMOGLOBIN A1C  06/09/2023   OPHTHALMOLOGY EXAM  08/27/2023    Last A1c:  Lab Results  Component Value Date   HGBA1C 6.8 (H) 12/09/2022    ROS: No reports of chest pain, dizziness, falls, polydipsia, polyuria or visual disturbances  2.  Depressive disorder Patient reports some mood instability.  She resumed her Prozac earlier this year.  However it upsets her stomach quite a bit so she would like to transition to an alternative.  Her concern is that she does not want to gain weight.  She reports quite a bit of stress regarding her own health but certainly with regards to the health of her mom, Stacy [name].  She notes that she essentially has given up and does not wish to have any other medical interventions.  She is "ready to go home" and is referring to heaven.  She reports concerns about her siblings not coming to visit their mom given her medical issues.  A lot of this weighs on her   ROS: Per HPI  Allergies  Allergen Reactions   Avelox [Moxifloxacin Hcl In Nacl] Hives   Ciprofloxacin     REACTION: Rash   Livalo [Pitavastatin]    Metformin And Related     kidney   Moxifloxacin     REACTION: RASH   Bactrim [Sulfamethoxazole-Trimethoprim] Rash   Statins Other (See Comments)    myalgias   Sulfamethoxazole-Trimethoprim Rash    REACTION: Rash   Past Medical History:  Diagnosis Date   Acute on chronic respiratory failure (HCC) 12/09/2022   Allergic rhinitis 02/25/2008   Qualifier: Diagnosis of   By: Cathey Endow DO, Karen     Angina pectoris (HCC) 06/09/2021   Aortic atherosclerosis (HCC) 08/14/2017   Appreciated on CXR 08/14/2017   Asthma    Asthmatic bronchitis 12/07/2021   Atrophy of vagina 12/07/2021   CAP (community acquired pneumonia) 12/08/2022   Centrilobular emphysema (HCC) 05/28/2018   Controlled diabetes mellitus type 2 with complications (HCC) 05/22/2018   Has FLD.   COPD with acute exacerbation (HCC) 11/02/2007   Formatting of this note might be different from the original.  PFT 05/28/2019           ratio 50% FEV1 53% FVC 83%    DLCO 54%  PFT 09/25/18 ([facility]) ratio 52% FEV1 56% FVC 108%; DLCO 69%   Coronary artery disease    Dysphagia 12/07/2021   Dyspnea on exertion 08/21/2018   Elevated LFTs 12/11/2022   Essential hypertension 12/07/2021   Facet arthropathy, lumbar 12/15/2021   Family history of osteoporosis 04/22/2019   Family history of rheumatoid arthritis 06/05/2019   Fatty liver disease, nonalcoholic    Ganglion of hand 12/07/2021   GERD 11/02/2007   Qualifier: Diagnosis of  By: Cathey Endow DO, Karen     Hemoptysis 12/08/2022   Hemorrhoids 12/07/2021   Hyperlipidemia associated with type 2 diabetes mellitus (HCC) 03/24/2020   Hypersomnia with sleep apnea 05/28/2018   HYPERTENSION, BENIGN 12/24/2008   Qualifier: Diagnosis of  By: Jens Som, MD, Dierdre Highman  Lelon Perla    Hypothyroidism 06/13/2007   ? Hashimotos.  Has strong family hx autoimmune d/o No h/o thyroidectomy or radioablation.    IBS (irritable bowel syndrome)    Insomnia- related to menopause 05/28/2018   Lactic acid increased 12/09/2022   Lateral epicondylitis of left elbow 06/05/2019   Lung disease, bullous (HCC) 08/14/2017   Menopause 12/07/2021   Obesity (BMI 30.0-34.9) 05/28/2018   Osteopenia of multiple sites 12/15/2021   Overactive bladder 06/09/2017   Pain in female genitalia on intercourse 12/07/2021   Palpitations 08/03/2022   Polyp of colon 12/07/2021   Post-menopausal 04/22/2019   Precordial  pain    Sees Dr Jens Som.  Last cardiac cath 2016 = negative. Strong family h/o MI and CAD   Psychophysiological insomnia 05/28/2018   Reactive depression 06/26/2018   Rosacea 08/01/2007   Qualifier: Diagnosis of  By: Cathey Endow DO, Karen     Snoring 05/28/2018   Stage 2 moderate COPD by GOLD classification (HCC) 11/02/2007   Formatting of this note might be different from the original. PFT 05/28/2019           ratio 50% FEV1 53% FVC 83%    DLCO 54% PFT 09/25/18 ([facility]) ratio 52% FEV1 56% FVC 108%; DLCO 69%   Statin intolerance 04/10/2019   Stress incontinence in female 06/09/2017   Type 2 MI (myocardial infarction) (HCC) 12/08/2022   UTI'S, RECURRENT 06/13/2007   Qualifier: Diagnosis of  By: Thomos Lemons     Vitamin D insufficiency 12/15/2021    Current Outpatient Medications:    albuterol (PROVENTIL) (2.5 MG/3ML) 0.083% nebulizer solution, Take 3 mLs (2.5 mg total) by nebulization every 6 (six) hours as needed for wheezing or shortness of breath., Disp: 75 mL, Rfl: 12   albuterol (VENTOLIN HFA) 108 (90 Base) MCG/ACT inhaler, Inhale 2 puffs into the lungs every 6 (six) hours as needed for wheezing or shortness of breath., Disp: 8 g, Rfl: 2   ANORO ELLIPTA 62.5-25 MCG/ACT AEPB, USE 1 INHALATION BY MOUTH ONCE  DAILY AT THE SAME TIME EACH DAY, Disp: 180 each, Rfl: 3   benzonatate (TESSALON) 100 MG capsule, Take 1 capsule (100 mg total) by mouth every 8 (eight) hours., Disp: 21 capsule, Rfl: 0   doxycycline (VIBRA-TABS) 100 MG tablet, Take 1 tablet (100 mg total) by mouth 2 (two) times daily., Disp: 14 tablet, Rfl: 0   Evolocumab (REPATHA SURECLICK) 140 MG/ML SOAJ, Inject 140 mg into the skin every 14 (fourteen) days., Disp: 2 mL, Rfl: 11   FLUoxetine (PROZAC) 20 MG capsule, Take 20 mg by mouth daily., Disp: , Rfl:    furosemide (LASIX) 20 MG tablet, Take 20 mg by mouth daily., Disp: , Rfl:    guaiFENesin (MUCINEX) 600 MG 12 hr tablet, Take 1,200 mg by mouth 2 (two) times daily as needed for cough.,  Disp: , Rfl:    levothyroxine (SYNTHROID) 75 MCG tablet, TAKE 1 TABLET BY MOUTH DAILY, Disp: 100 tablet, Rfl: 0   loratadine (CLARITIN) 10 MG tablet, Take 10 mg by mouth daily., Disp: , Rfl:    metoprolol succinate (TOPROL XL) 25 MG 24 hr tablet, Take 1 tablet (25 mg total) by mouth daily., Disp: 90 tablet, Rfl: 1   Omeprazole-Sodium Bicarbonate (ZEGERID) 20-1100 MG CAPS capsule, Take 1 capsule by mouth at bedtime., Disp: , Rfl:    Vitamin D, Ergocalciferol, (DRISDOL) 1.25 MG (50000 UNIT) CAPS capsule, Take 1 capsule (50,000 Units total) by mouth every 7 (seven) days., Disp: 12 capsule, Rfl: 3  zolpidem (AMBIEN) 5 MG tablet, Take 1 tablet (5 mg total) by mouth at bedtime as needed for sleep., Disp: 30 tablet, Rfl: 0 Social History   Socioeconomic History   Marital status: Married    Spouse name: Not on file   Number of children: 2   Years of education: Not on file   Highest education level: Associate degree: academic program  Occupational History   Occupation: Works in Photographer   Occupation: Disabled  Tobacco Use   Smoking status: Former    Current packs/day: 0.00    Average packs/day: 0.5 packs/day for 30.0 years (15.0 ttl pk-yrs)    Types: Cigarettes    Start date: 10/24/1977    Quit date: 10/25/2007    Years since quitting: 15.6   Smokeless tobacco: Never  Vaping Use   Vaping status: Never Used  Substance and Sexual Activity   Alcohol use: Yes    Alcohol/week: 1.0 standard drink of alcohol    Types: 1 Glasses of wine per week    Comment: Occasional-once a year   Drug use: No   Sexual activity: Yes    Birth control/protection: Surgical  Other Topics Concern   Not on file  Social History Narrative   Remarried.    Previous occupation: Psychologist, occupational.       Moved from [facility] in 2007   2 children, has stepchildren and grandchildren   Does not exercise   Social Determinants of Health   Financial Resource Strain: Low Risk  (12/29/2022)   Overall Financial Resource Strain (CARDIA)     Difficulty of Paying Living Expenses: Not hard at all  Food Insecurity: No Food Insecurity (12/29/2022)   Hunger Vital Sign    Worried About Running Out of Food in the Last Year: Never true    Ran Out of Food in the Last Year: Never true  Transportation Needs: No Transportation Needs (12/29/2022)   PRAPARE - Administrator, Civil Service (Medical): No    Lack of Transportation (Non-Medical): No  Physical Activity: Inactive (12/29/2022)   Exercise Vital Sign    Days of Exercise per Week: 0 days    Minutes of Exercise per Session: 0 min  Stress: No Stress Concern Present (12/29/2022)   [employer] of Occupational Health - Occupational Stress Questionnaire    Feeling of Stress : Not at all  Social Connections: Moderately Integrated (12/29/2022)   Social Connection and Isolation Panel [NHANES]    Frequency of Communication with Friends and Family: More than three times a week    Frequency of Social Gatherings with Friends and Family: More than three times a week    Attends Religious Services: 1 to 4 times per year    Active Member of [employer] or Organizations: No    Attends Banker Meetings: Never    Marital Status: Married  Catering manager Violence: Not At Risk (12/29/2022)   Humiliation, Afraid, Rape, and Kick questionnaire    Fear of Current or Ex-Partner: No    Emotionally Abused: No    Physically Abused: No    Sexually Abused: No   Family History  Problem Relation Age of Onset   Hypertension Father    Hyperlipidemia Father    Heart disease Father        Died at age 2 of MI   Hypothyroidism Father    Diabetes Maternal Grandmother    Raynaud syndrome Sister    Rheum arthritis Sister    Thyroid disease Sister    CAD  Brother        has 4 stents   Rheum arthritis Brother    Pulmonary fibrosis Brother    Heart disease Sister 65       h/o MI @age  70   Thyroid disease Sister    Heart disease Mother    Hypothyroidism Mother    Macular degeneration Mother     Breast cancer Neg Hx    Ovarian cancer Neg Hx    Prostate cancer Neg Hx    Colon cancer Neg Hx    Stomach cancer Neg Hx    Esophageal cancer Neg Hx     Objective: Office vital signs reviewed. BP 108/70   Pulse (!) 58   Temp 97.9 F (36.6 C) (Temporal)   Ht 5' 3.5" (1.613 m)   Wt 147 lb (66.7 kg)   SpO2 94%   BMI 25.63 kg/m   Physical Examination:  General: Awake, alert, well nourished, No acute distress HEENT: Sclera white.  Moist mucous membranes.  No exophthalmos.  No goiter Cardio: regular rate and rhythm, S1S2 heard, no murmurs appreciated Pulm: clear to auscultation bilaterally, no wheezes, rhonchi or rales; normal work of breathing on room air Psych: Mood is depressed.  Diabetic Foot Exam - Simple   Simple Foot Form Diabetic Foot exam was performed with the following findings: Yes 06/07/2023  3:02 PM  Visual Inspection No deformities, no ulcerations, no other skin breakdown bilaterally: Yes Sensation Testing Intact to touch and monofilament testing bilaterally: Yes Pulse Check Posterior Tibialis and Dorsalis pulse intact bilaterally: Yes Comments Dry flaky skin but no other focal abnormalities       06/07/2023    2:14 PM 12/29/2022    9:58 AM 06/08/2022    3:47 PM  Depression screen PHQ 2/9  Decreased Interest 0 0 0  Down, Depressed, Hopeless 0 0 0  PHQ - 2 Score 0 0 0  Altered sleeping 2  2  Tired, decreased energy 2  2  Change in appetite 0  0  Feeling bad or failure about yourself  0  0  Trouble concentrating 0  0  Moving slowly or fidgety/restless 0  0  Suicidal thoughts 0  0  PHQ-9 Score 4  4  Difficult doing work/chores Not difficult at all  Not difficult at all      06/07/2023    2:14 PM 06/08/2022    3:47 PM 04/18/2022    9:35 AM 12/15/2021   10:40 AM  GAD 7 : Generalized Anxiety Score  Nervous, Anxious, on Edge 0 1 2 0  Control/stop worrying 0 0 1 0  Worry too much - different things 0 0 1 0  Trouble relaxing 0 0 2 1  Restless 0 0 0 0   Easily annoyed or irritable 0 0 0 0  Afraid - awful might happen 0 0 1 0  Total GAD 7 Score 0 1 7 1   Anxiety Difficulty Not difficult at all Not difficult at all Not difficult at all Not difficult at all    Assessment/ Plan: 67 y.o. female   Diet-controlled diabetes mellitus (HCC) - Plan: Microalbumin / creatinine urine ratio, Bayer DCA Hb A1c Waived  Hypertension associated with diabetes (HCC) - Plan: Basic Metabolic Panel  Hyperlipidemia associated with type 2 diabetes mellitus (HCC)  Statin-induced myositis  Acquired hypothyroidism - Plan: TSH, T4, Free  Centrilobular emphysema (HCC) - Plan: CBC  Reactive depression - Plan: desvenlafaxine (PRISTIQ) 50 MG 24 hr tablet  Stress due to illness of  family member   Check urine microalbumin.  Due for pneumococcal vaccination but she declines this today.  A1c demonstrated controlled blood sugar at 6.5 today.  No further interventions are needed at this time  Blood pressure is well-controlled with current regimen.  She is treated with Repatha once monthly and will be due for lipid panel but she was nonfasting at this appointment  Asymptomatic from a thyroid standpoint with the exception of depressive disorder which I think is likely due to her own medical issues and due to her mother's illness.  We will transition her off of Prozac and of advised her to do take 1 capsule every other day for 1 week and then off.  I doubt she will have withdrawal symptoms given the long half-life and lower dose.  We discussed a 1 week washout then she may start the Pristiq which has been sent to her mail order pharmacy.  She will reconvene with me via MyChart should she need any dose adjustment or further changes prior to her next visit  Stacy Ip, DO [facility] [phone]

## 2023-06-08 LAB — CBC
Hematocrit: 45.8 % (ref 34.0–46.6)
Hemoglobin: 15.2 g/dL (ref 11.1–15.9)
MCH: 31.2 pg (ref 26.6–33.0)
MCHC: 33.2 g/dL (ref 31.5–35.7)
MCV: 94 fL (ref 79–97)
Platelets: 338 10*3/uL (ref 150–450)
RBC: 4.87 x10E6/uL (ref 3.77–5.28)
RDW: 12.7 % (ref 11.7–15.4)
WBC: 9.8 10*3/uL (ref 3.4–10.8)

## 2023-06-08 LAB — MICROALBUMIN / CREATININE URINE RATIO
Creatinine, Urine: 10 mg/dL
Microalb/Creat Ratio: <30 mg/g{creat} (ref 0–29)
Microalbumin, Urine: <3 ug/mL

## 2023-06-08 LAB — TSH: TSH: 1.91 u[IU]/mL (ref 0.450–4.500)

## 2023-06-08 LAB — BASIC METABOLIC PANEL
BUN/Creatinine Ratio: 17 (ref 12–28)
BUN: 14 mg/dL (ref 8–27)
CO2: 25 mmol/L (ref 20–29)
Calcium: 10.2 mg/dL (ref 8.7–10.3)
Chloride: 99 mmol/L (ref 96–106)
Creatinine, Ser: 0.84 mg/dL (ref 0.57–1.00)
Glucose: 92 mg/dL (ref 70–99)
Potassium: 4.1 mmol/L (ref 3.5–5.2)
Sodium: 140 mmol/L (ref 134–144)
eGFR: 77 mL/min/{1.73_m2} (ref >59)

## 2023-06-08 LAB — T4, FREE: Free T4: 1.51 ng/dL (ref 0.82–1.77)

## 2023-06-15 DIAGNOSIS — I700 Atherosclerosis of aorta: Secondary | ICD-10-CM | POA: Diagnosis not present

## 2023-06-15 DIAGNOSIS — J9601 Acute respiratory failure with hypoxia: Secondary | ICD-10-CM | POA: Diagnosis not present

## 2023-06-15 DIAGNOSIS — J449 Chronic obstructive pulmonary disease, unspecified: Secondary | ICD-10-CM | POA: Diagnosis not present

## 2023-06-15 DIAGNOSIS — J432 Centrilobular emphysema: Secondary | ICD-10-CM | POA: Diagnosis not present

## 2023-06-15 DIAGNOSIS — L139 Bullous disorder, unspecified: Secondary | ICD-10-CM | POA: Diagnosis not present

## 2023-06-16 ENCOUNTER — Other Ambulatory Visit: Payer: Self-pay | Admitting: Family Medicine

## 2023-06-19 ENCOUNTER — Ambulatory Visit: Payer: Medicare Other | Admitting: Family Medicine

## 2023-07-10 ENCOUNTER — Other Ambulatory Visit: Payer: Self-pay | Admitting: Cardiology

## 2023-08-29 DIAGNOSIS — K649 Unspecified hemorrhoids: Secondary | ICD-10-CM | POA: Diagnosis not present

## 2023-08-29 DIAGNOSIS — K921 Melena: Secondary | ICD-10-CM | POA: Diagnosis not present

## 2023-08-29 DIAGNOSIS — R1310 Dysphagia, unspecified: Secondary | ICD-10-CM | POA: Diagnosis not present

## 2023-09-05 ENCOUNTER — Encounter: Payer: Self-pay | Admitting: Internal Medicine

## 2023-09-27 ENCOUNTER — Encounter: Payer: Self-pay | Admitting: Family Medicine

## 2023-09-27 ENCOUNTER — Other Ambulatory Visit: Payer: Self-pay | Admitting: Cardiology

## 2023-09-27 DIAGNOSIS — D140 Benign neoplasm of middle ear, nasal cavity and accessory sinuses: Secondary | ICD-10-CM | POA: Diagnosis not present

## 2023-09-27 DIAGNOSIS — J3489 Other specified disorders of nose and nasal sinuses: Secondary | ICD-10-CM | POA: Diagnosis not present

## 2023-09-27 DIAGNOSIS — J310 Chronic rhinitis: Secondary | ICD-10-CM | POA: Diagnosis not present

## 2023-09-27 DIAGNOSIS — J342 Deviated nasal septum: Secondary | ICD-10-CM | POA: Diagnosis not present

## 2023-09-27 DIAGNOSIS — J329 Chronic sinusitis, unspecified: Secondary | ICD-10-CM | POA: Diagnosis not present

## 2023-09-27 DIAGNOSIS — F329 Major depressive disorder, single episode, unspecified: Secondary | ICD-10-CM

## 2023-09-27 MED ORDER — DESVENLAFAXINE SUCCINATE ER 50 MG PO TB24: 50.0000 mg | ORAL_TABLET | Freq: Every day | ORAL | 0 refills | Status: DC

## 2023-09-27 NOTE — Telephone Encounter (Signed)
30 day supply of pristiq sent to local pharmacy and pt is aware.  Copied from CRM [id number]. Topic: Clinical - Medication Refill >> Sep 27, 2023  4:20 PM Tiffany H wrote: Most Recent Primary Care Visit:  Provider: Raliegh Ip  Department: [facility]  Visit Type: OFFICE VISIT  Date: 06/07/2023  Medication: desvenlafaxine (PRISTIQ) 50 MG 24 hr tablet [[id number]]  Patient's medication usually go through home delivery. The medication was misrouted to [place]. Patient has been out of medication for 5 days. She's starting to feel the effects of missed doses. Please expedite.   Has the patient contacted their pharmacy? Yes (Agent: If no, request that the patient contact the pharmacy for the refill. If patient does not wish to contact the pharmacy document the reason why and proceed with request.) (Agent: If yes, when and what did the pharmacy advise?)  Is this the correct pharmacy for this prescription? Yes If no, delete pharmacy and type the correct one.  This is the patient's preferred pharmacy:  [id number] - [place], [place] - [address] [place] [address] Phone: [phone] Fax: [phone]   Has the prescription been filled recently? Yes  Is the patient out of the medication? Yes  Has the patient been seen for an appointment in the last year OR does the patient have an upcoming appointment? Yes  Can we respond through MyChart? Yes  Agent: Please be advised that Rx refills may take up to 3 business days. We ask that you follow-up with your pharmacy.

## 2023-09-27 NOTE — Addendum Note (Signed)
Addended by: Hessie Diener on: 09/27/2023 04:37 PM   Modules accepted: Orders

## 2023-10-01 ENCOUNTER — Ambulatory Visit: Payer: Medicare Other

## 2023-10-01 ENCOUNTER — Ambulatory Visit
Admission: EM | Admit: 2023-10-01 | Discharge: 2023-10-01 | Disposition: A | Discharge status: Home / Self Care | Payer: Medicare Other | Attending: Family Medicine | Admitting: Family Medicine

## 2023-10-01 ENCOUNTER — Encounter: Payer: Self-pay | Admitting: Emergency Medicine

## 2023-10-01 ENCOUNTER — Other Ambulatory Visit: Payer: Self-pay

## 2023-10-01 DIAGNOSIS — M1712 Unilateral primary osteoarthritis, left knee: Secondary | ICD-10-CM | POA: Diagnosis not present

## 2023-10-01 DIAGNOSIS — M2392 Unspecified internal derangement of left knee: Secondary | ICD-10-CM | POA: Diagnosis not present

## 2023-10-01 DIAGNOSIS — M25562 Pain in left knee: Secondary | ICD-10-CM | POA: Diagnosis not present

## 2023-10-01 NOTE — ED Triage Notes (Signed)
Patient presents to Urgent Care with complaints of behind the left knee since 3-4 days ago. Patient reports the pain comes and goes. Nothing makes better or worst. When the pain hits will cause the leg to buckle. Does take Magnesium at night for leg cramps. Marland Kitchen

## 2023-10-01 NOTE — ED Provider Notes (Signed)
[facility]    CSN: [id number] Arrival date & time: 10/01/23  1338      History   Chief Complaint Chief Complaint  Patient presents with   Leg Pain    HPI Stacy [name] is a 67 y.o. female.   Patient is here with left knee pain.  She states her left knee has been bothering her for the 3 to 4 days.  She states she has unpredictable sharp pain in her knee that makes her feel like she is going to fall.  Her knee feels like it will buckle.  It is always in the back part of her knee on the outside.  She has had no old knee problems or knee injury.  No known arthritis.  She has had no trauma or fall.  She has not noted any swelling.  Today her knee felt like it was going to buckle 4 times during 1 walk, so she came in for evaluation    Past Medical History:  Diagnosis Date   Acute on chronic respiratory failure (HCC) 12/09/2022   Allergic rhinitis 02/25/2008   Qualifier: Diagnosis of  By: Cathey Endow DO, Karen     Angina pectoris (HCC) 06/09/2021   Aortic atherosclerosis (HCC) 08/14/2017   Appreciated on CXR 08/14/2017   Asthma    Asthmatic bronchitis 12/07/2021   Atrophy of vagina 12/07/2021   CAP (community acquired pneumonia) 12/08/2022   Centrilobular emphysema (HCC) 05/28/2018   Controlled diabetes mellitus type 2 with complications (HCC) 05/22/2018   Has FLD.   COPD with acute exacerbation (HCC) 11/02/2007   Formatting of this note might be different from the original.  PFT 05/28/2019           ratio 50% FEV1 53% FVC 83%    DLCO 54%  PFT 09/25/18 ([facility]) ratio 52% FEV1 56% FVC 108%; DLCO 69%   Coronary artery disease    Dysphagia 12/07/2021   Dyspnea on exertion 08/21/2018   Elevated LFTs 12/11/2022   Essential hypertension 12/07/2021   Facet arthropathy, lumbar 12/15/2021   Family history of osteoporosis 04/22/2019   Family history of rheumatoid arthritis 06/05/2019   Fatty liver disease, nonalcoholic    Ganglion of hand 12/07/2021   GERD 11/02/2007   Qualifier:  Diagnosis of  By: Cathey Endow DO, Karen     Hemoptysis 12/08/2022   Hemorrhoids 12/07/2021   Hyperlipidemia associated with type 2 diabetes mellitus (HCC) 03/24/2020   Hypersomnia with sleep apnea 05/28/2018   HYPERTENSION, BENIGN 12/24/2008   Qualifier: Diagnosis of  By: Jens Som, MD, Lyn Hollingshead    Hypothyroidism 06/13/2007   ? Hashimotos.  Has strong family hx autoimmune d/o No h/o thyroidectomy or radioablation.    IBS (irritable bowel syndrome)    Insomnia- related to menopause 05/28/2018   Lactic acid increased 12/09/2022   Lateral epicondylitis of left elbow 06/05/2019   Lung disease, bullous (HCC) 08/14/2017   Menopause 12/07/2021   Obesity (BMI 30.0-34.9) 05/28/2018   Osteopenia of multiple sites 12/15/2021   Overactive bladder 06/09/2017   Pain in female genitalia on intercourse 12/07/2021   Palpitations 08/03/2022   Polyp of colon 12/07/2021   Post-menopausal 04/22/2019   Precordial pain    Sees Dr Jens Som.  Last cardiac cath 2016 = negative. Strong family h/o MI and CAD   Psychophysiological insomnia 05/28/2018   Reactive depression 06/26/2018   Rosacea 08/01/2007   Qualifier: Diagnosis of  By: Cathey Endow DO, Karen     Snoring 05/28/2018   Stage 2 moderate COPD  by GOLD classification (HCC) 11/02/2007   Formatting of this note might be different from the original. PFT 05/28/2019           ratio 50% FEV1 53% FVC 83%    DLCO 54% PFT 09/25/18 ([facility]) ratio 52% FEV1 56% FVC 108%; DLCO 69%   Statin intolerance 04/10/2019   Stress incontinence in female 06/09/2017   Type 2 MI (myocardial infarction) (HCC) 12/08/2022   UTI'S, RECURRENT 06/13/2007   Qualifier: Diagnosis of  By: Thomos Lemons     Vitamin D insufficiency 12/15/2021    Patient Active Problem List   Diagnosis Date Noted   Precordial pain 12/26/2022   Elevated LFTs 12/11/2022   Lactic acid increased 12/09/2022   Chronic respiratory failure (HCC) 12/09/2022   Hemoptysis 12/08/2022   Type 2 MI (myocardial  infarction) (HCC) 12/08/2022   Palpitations 08/03/2022   Osteopenia of multiple sites 12/15/2021   Vitamin D insufficiency 12/15/2021   Facet arthropathy, lumbar 12/15/2021   Asthma 12/07/2021   Coronary artery disease 12/07/2021   Atrophy of vagina 12/07/2021   Dysphagia 12/07/2021   Ganglion of hand 12/07/2021   Hemorrhoids 12/07/2021   Polyp of colon 12/07/2021   Asthmatic bronchitis 12/07/2021   Pain in female genitalia on intercourse 12/07/2021   Essential hypertension 12/07/2021   Menopause 12/07/2021   Angina pectoris (HCC) 06/09/2021   Fatty liver disease, nonalcoholic 06/07/2021   IBS (irritable bowel syndrome) 06/07/2021   Hyperlipidemia associated with type 2 diabetes mellitus (HCC) 03/24/2020   Family history of rheumatoid arthritis 06/05/2019   Lateral epicondylitis of left elbow 06/05/2019   Family history of osteoporosis 04/22/2019   Statin intolerance 04/10/2019   Dyspnea on exertion 08/21/2018   Reactive depression 06/26/2018   Insomnia- related to menopause 05/28/2018   Snoring 05/28/2018   Hypersomnia with sleep apnea 05/28/2018   Centrilobular emphysema (HCC) 05/28/2018   Obesity (BMI 30.0-34.9) 05/28/2018   Psychophysiological insomnia 05/28/2018   Controlled diabetes mellitus type 2 with complications (HCC) 05/22/2018   Lung disease, bullous (HCC) 08/14/2017   Aortic atherosclerosis (HCC) 08/14/2017   Stress incontinence in female 06/09/2017   Overactive bladder 06/09/2017   HYPERTENSION, BENIGN 12/24/2008   Allergic rhinitis 02/25/2008   GERD 11/02/2007   COPD (chronic obstructive pulmonary disease) (HCC) 11/02/2007   PERIMENOPAUSAL SYNDROME 11/02/2007   Rosacea 08/01/2007   Hypothyroidism 06/13/2007   UTI'S, RECURRENT 06/13/2007    Past Surgical History:  Procedure Laterality Date   ABLATION     CARDIAC CATHETERIZATION N/A 06/12/2015   Procedure: Left Heart Cath and Coronary Angiography;  Surgeon: Kathleene Hazel, MD;  Location: [facility]  INVASIVE CV LAB;  Service: Cardiovascular;  Laterality: N/A;   CESAREAN SECTION     LTCS     x2   NASAL SINUS SURGERY     SPIROMETRY  01/05/2007   FVC 68% predicted; FEV1 44% predicted; FEV1/FVC 63% predicted = moderate obstruction with low vital capacity possibly due to restriction   SQUAMOUS CELL CARCINOMA EXCISION     TUBAL LIGATION      OB History     Gravida  3   Para  2   Term  2   Preterm      AB  1   Living  2      SAB  1   IAB      Ectopic      Multiple      Live Births  Home Medications    Prior to Admission medications   Medication Sig Start Date End Date Taking? Authorizing Provider  albuterol (PROVENTIL) (2.5 MG/3ML) 0.083% nebulizer solution Take 3 mLs (2.5 mg total) by nebulization every 6 (six) hours as needed for wheezing or shortness of breath. 12/06/22  Yes Aberman, Merla Riches, PA-C  albuterol (VENTOLIN HFA) 108 (90 Base) MCG/ACT inhaler Inhale 2 puffs into the lungs every 6 (six) hours as needed for wheezing or shortness of breath. 12/12/22  Yes Cobb, Ruby Cola, NP  ANORO ELLIPTA 62.5-25 MCG/ACT AEPB USE 1 INHALATION BY MOUTH ONCE  DAILY AT THE SAME TIME EACH DAY 12/26/22  Yes Byrum, Les Pou, MD  Evolocumab (REPATHA SURECLICK) 140 MG/ML SOAJ Inject 140 mg into the skin every 14 (fourteen) days. 05/30/23  Yes Revankar, Aundra Dubin, MD  furosemide (LASIX) 20 MG tablet Take 20 mg by mouth daily. 08/05/22  Yes [provider]  levothyroxine (SYNTHROID) 75 MCG tablet TAKE 1 TABLET BY MOUTH DAILY 06/19/23  Yes Gottschalk, Ashly M, DO  loratadine (CLARITIN) 10 MG tablet Take 10 mg by mouth daily.   Yes [provider]  metoprolol succinate (TOPROL-XL) 25 MG 24 hr tablet Take 1 tablet (25 mg total) by mouth daily. 09/27/23  Yes Revankar, Aundra Dubin, MD  Omeprazole-Sodium Bicarbonate (ZEGERID) 20-1100 MG CAPS capsule Take 1 capsule by mouth at bedtime.   Yes [provider]  zolpidem (AMBIEN) 5 MG tablet Take 1 tablet (5  mg total) by mouth at bedtime as needed for sleep. 12/11/22  Yes Calvert Cantor, MD    Family History Family History  Problem Relation Age of Onset   Hypertension Father    Hyperlipidemia Father    Heart disease Father        Died at age 35 of MI   Hypothyroidism Father    Diabetes Maternal Grandmother    Raynaud syndrome Sister    Rheum arthritis Sister    Thyroid disease Sister    CAD Brother        has 4 stents   Rheum arthritis Brother    Pulmonary fibrosis Brother    Heart disease Sister 19       h/o MI @age  78   Thyroid disease Sister    Heart disease Mother    Hypothyroidism Mother    Macular degeneration Mother    Breast cancer Neg Hx    Ovarian cancer Neg Hx    Prostate cancer Neg Hx    Colon cancer Neg Hx    Stomach cancer Neg Hx    Esophageal cancer Neg Hx     Social History Social History   Tobacco Use   Smoking status: Former    Current packs/day: 0.00    Average packs/day: 0.5 packs/day for 30.0 years (15.0 ttl pk-yrs)    Types: Cigarettes    Start date: 10/24/1977    Quit date: 10/25/2007    Years since quitting: 15.9   Smokeless tobacco: Never  Vaping Use   Vaping status: Never Used  Substance Use Topics   Alcohol use: Yes    Alcohol/week: 1.0 standard drink of alcohol    Types: 1 Glasses of wine per week    Comment: Occasional-once a year   Drug use: No     Allergies   Avelox [moxifloxacin hcl in nacl], Ciprofloxacin, Livalo [pitavastatin], Metformin and related, Moxifloxacin, Bactrim [sulfamethoxazole-trimethoprim], Statins, and Sulfamethoxazole-trimethoprim   Review of Systems Review of Systems See HPI  Physical Exam Triage Vital Signs ED Triage  Vitals  Encounter Vitals Group     BP 10/01/23 1418 137/82     Systolic BP Percentile --      Diastolic BP Percentile --      Pulse Rate 10/01/23 1418 70     Resp 10/01/23 1418 16     Temp 10/01/23 1418 97.7 F (36.5 C)     Temp Source 10/01/23 1418 Oral     SpO2 10/01/23 1418 94 %      Weight --      Height --      Head Circumference --      Peak Flow --      Pain Score 10/01/23 1416 10     Pain Loc --      Pain Education --      Exclude from Growth Chart --    No data found.  Updated Vital Signs BP 137/82 (BP Location: Right Arm)   Pulse 70   Temp 97.7 F (36.5 C) (Oral)   Resp 16   SpO2 94%     Physical Exam Constitutional:      General: She is not in acute distress.    Appearance: She is well-developed.  HENT:     Head: Normocephalic and atraumatic.  Eyes:     Conjunctiva/sclera: Conjunctivae normal.     Pupils: Pupils are equal, round, and reactive to light.  Cardiovascular:     Rate and Rhythm: Normal rate.  Pulmonary:     Effort: Pulmonary effort is normal. No respiratory distress.  Abdominal:     General: There is no distension.     Palpations: Abdomen is soft.  Musculoskeletal:        General: Tenderness present. No swelling, deformity or signs of injury. Normal range of motion.     Cervical back: Normal range of motion.     Comments: Left knee has full range of motion.  Mild patellofemoral crepitus.  No pain with patellofemoral grind testing.  No effusion or swelling or warmth.  There is mild tenderness at the posterior portion of the lateral joint line.  No instability  Skin:    General: Skin is warm and dry.  Neurological:     Mental Status: She is alert.     Gait: Gait normal.      UC Treatments / Results  Labs (all labs ordered are listed, but only abnormal results are displayed) Labs Reviewed - No data to display  EKG   Radiology DG Knee Complete 4 Views Left  Result Date: 10/01/2023 CLINICAL DATA:  Pain in the left knee for 3-4 days. EXAM: LEFT KNEE - COMPLETE 4+ VIEW COMPARISON:  None Available. FINDINGS: No evidence of fracture, dislocation, or joint effusion. There is mild tricompartmental osteoarthritis. Soft tissues are unremarkable. IMPRESSION: Mild tricompartmental osteoarthritis. Electronically Signed   By: Romona Curls M.D.   On: 10/01/2023 15:00    Procedures Procedures (including critical care time)  Medications Ordered in UC Medications - No data to display  Initial Impression / Assessment and Plan / UC Course  I have reviewed the triage vital signs and the nursing notes.  Pertinent labs & imaging results that were available during my care of the patient were reviewed by me and considered in my medical decision making (see chart for details).     I explained to the patient that buckling is a finding suspicious for internal derangement in the knee.  She has some rough or torn cartilage in her knee that may need further  evaluation.  Orthopedic follow-up is appropriate if she fails to improve Final Clinical Impressions(s) / UC Diagnoses   Final diagnoses:  Internal derangement of left knee  Primary osteoarthritis of left knee     Discharge Instructions      Take ibuprofen or naproxen if needed for knee pain See your doctor, or consider an orthopedic if you fail to improve     ED Prescriptions   None    PDMP not reviewed this encounter.   Eustace Moore, MD 10/01/23 [phone]

## 2023-10-01 NOTE — Discharge Instructions (Signed)
Take ibuprofen or naproxen if needed for knee pain See your doctor, or consider an orthopedic if you fail to improve

## 2023-10-03 DIAGNOSIS — K5730 Diverticulosis of large intestine without perforation or abscess without bleeding: Secondary | ICD-10-CM | POA: Diagnosis not present

## 2023-10-03 DIAGNOSIS — K921 Melena: Secondary | ICD-10-CM | POA: Diagnosis not present

## 2023-10-03 DIAGNOSIS — K649 Unspecified hemorrhoids: Secondary | ICD-10-CM | POA: Diagnosis not present

## 2023-10-12 DIAGNOSIS — U071 COVID-19: Secondary | ICD-10-CM | POA: Diagnosis not present

## 2023-10-12 DIAGNOSIS — Z20822 Contact with and (suspected) exposure to covid-19: Secondary | ICD-10-CM | POA: Diagnosis not present

## 2023-11-08 ENCOUNTER — Ambulatory Visit: Payer: Medicare Other | Admitting: Emergency Medicine

## 2024-01-07 ENCOUNTER — Other Ambulatory Visit: Payer: Self-pay | Admitting: Emergency Medicine

## 2024-04-11 ENCOUNTER — Telehealth: Payer: Self-pay

## 2024-07-30 ENCOUNTER — Ambulatory Visit: Attending: Orthopedic Surgery | Admitting: Physical Therapy

## 2024-07-30 ENCOUNTER — Encounter: Payer: Self-pay | Admitting: Physical Therapy

## 2024-07-30 ENCOUNTER — Other Ambulatory Visit: Payer: Self-pay

## 2024-07-30 DIAGNOSIS — M25511 Pain in right shoulder: Secondary | ICD-10-CM | POA: Insufficient documentation

## 2024-07-30 DIAGNOSIS — G8929 Other chronic pain: Secondary | ICD-10-CM | POA: Diagnosis present

## 2024-07-30 DIAGNOSIS — M25611 Stiffness of right shoulder, not elsewhere classified: Secondary | ICD-10-CM | POA: Diagnosis present

## 2024-07-30 DIAGNOSIS — M62838 Other muscle spasm: Secondary | ICD-10-CM | POA: Insufficient documentation

## 2024-07-30 NOTE — Therapy (Signed)
 OUTPATIENT PHYSICAL THERAPY SHOULDER EVALUATION   Patient Name: Stacy [name] MRN: [id number] DOB:09/06/1956, 68 y.o., female Today's Date: 07/30/2024  END OF SESSION:  PT End of Session - 07/30/24 1132     Visit Number 1    Number of Visits 12    Date for Recertification  09/10/24    PT Start Time 0934    PT Stop Time 1020    PT Time Calculation (min) 46 min    Activity Tolerance Patient tolerated treatment well    Behavior During Therapy [facility] for tasks assessed/performed          Past Medical History:  Diagnosis Date   Acute on chronic respiratory failure (HCC) 12/09/2022   Allergic rhinitis 02/25/2008   Qualifier: Diagnosis of  By: Waylan DO, Karen     Angina pectoris 06/09/2021   Aortic atherosclerosis 08/14/2017   Appreciated on CXR 08/14/2017   Asthma    Asthmatic bronchitis 12/07/2021   Atrophy of vagina 12/07/2021   CAP (community acquired pneumonia) 12/08/2022   Centrilobular emphysema (HCC) 05/28/2018   Controlled diabetes mellitus type 2 with complications (HCC) 05/22/2018   Has FLD.   COPD with acute exacerbation (HCC) 11/02/2007   Formatting of this note might be different from the original.  PFT 05/28/2019           ratio 50% FEV1 53% FVC 83%    DLCO 54%  PFT 09/25/18 ([facility]) ratio 52% FEV1 56% FVC 108%; DLCO 69%   Coronary artery disease    Dysphagia 12/07/2021   Dyspnea on exertion 08/21/2018   Elevated LFTs 12/11/2022   Essential hypertension 12/07/2021   Facet arthropathy, lumbar 12/15/2021   Family history of osteoporosis 04/22/2019   Family history of rheumatoid arthritis 06/05/2019   Fatty liver disease, nonalcoholic    Ganglion of hand 12/07/2021   GERD 11/02/2007   Qualifier: Diagnosis of  By: Waylan DO, Karen     Hemoptysis 12/08/2022   Hemorrhoids 12/07/2021   Hyperlipidemia associated with type 2 diabetes mellitus (HCC) 03/24/2020   Hypersomnia with sleep apnea 05/28/2018   HYPERTENSION, BENIGN 12/24/2008   Qualifier: Diagnosis of  By:  Pietro, MD, CODY Redell Dimes    Hypothyroidism 06/13/2007   ? Hashimotos.  Has strong family hx autoimmune d/o No h/o thyroidectomy or radioablation.    IBS (irritable bowel syndrome)    Insomnia- related to menopause 05/28/2018   Lactic acid increased 12/09/2022   Lateral epicondylitis of left elbow 06/05/2019   Lung disease, bullous (HCC) 08/14/2017   Menopause 12/07/2021   Obesity (BMI 30.0-34.9) 05/28/2018   Osteopenia of multiple sites 12/15/2021   Overactive bladder 06/09/2017   Pain in female genitalia on intercourse 12/07/2021   Palpitations 08/03/2022   Polyp of colon 12/07/2021   Post-menopausal 04/22/2019   Precordial pain    Sees Dr Pietro.  Last cardiac cath 2016 = negative. Strong family h/o MI and CAD   Psychophysiological insomnia 05/28/2018   Reactive depression 06/26/2018   Rosacea 08/01/2007   Qualifier: Diagnosis of  By: Waylan DO, Karen     Snoring 05/28/2018   Stage 2 moderate COPD by GOLD classification (HCC) 11/02/2007   Formatting of this note might be different from the original. PFT 05/28/2019           ratio 50% FEV1 53% FVC 83%    DLCO 54% PFT 09/25/18 ([facility]) ratio 52% FEV1 56% FVC 108%; DLCO 69%   Statin intolerance 04/10/2019   Stress incontinence in female 06/09/2017   Type  2 MI (myocardial infarction) (HCC) 12/08/2022   UTI'S, RECURRENT 06/13/2007   Qualifier: Diagnosis of  By: Waylan ROSALEA Pao     Vitamin D  insufficiency 12/15/2021   Past Surgical History:  Procedure Laterality Date   ABLATION     CARDIAC CATHETERIZATION N/A 06/12/2015   Procedure: Left Heart Cath and Coronary Angiography;  Surgeon: Lonni JONETTA Cash, MD;  Location: [facility] INVASIVE CV LAB;  Service: Cardiovascular;  Laterality: N/A;   CESAREAN SECTION     LTCS     x2   NASAL SINUS SURGERY     SPIROMETRY  01/05/2007   FVC 68% predicted; FEV1 44% predicted; FEV1/FVC 63% predicted = moderate obstruction with low vital capacity possibly due to restriction   SQUAMOUS CELL  CARCINOMA EXCISION     TUBAL LIGATION     Patient Active Problem List   Diagnosis Date Noted   Precordial pain 12/26/2022   Elevated LFTs 12/11/2022   Lactic acid increased 12/09/2022   Chronic respiratory failure (HCC) 12/09/2022   Hemoptysis 12/08/2022   Type 2 MI (myocardial infarction) (HCC) 12/08/2022   Palpitations 08/03/2022   Osteopenia of multiple sites 12/15/2021   Vitamin D  insufficiency 12/15/2021   Facet arthropathy, lumbar 12/15/2021   Asthma 12/07/2021   Coronary artery disease 12/07/2021   Atrophy of vagina 12/07/2021   Dysphagia 12/07/2021   Ganglion of hand 12/07/2021   Hemorrhoids 12/07/2021   Polyp of colon 12/07/2021   Asthmatic bronchitis 12/07/2021   Pain in female genitalia on intercourse 12/07/2021   Essential hypertension 12/07/2021   Menopause 12/07/2021   Angina pectoris 06/09/2021   Fatty liver disease, nonalcoholic 06/07/2021   IBS (irritable bowel syndrome) 06/07/2021   Hyperlipidemia associated with type 2 diabetes mellitus (HCC) 03/24/2020   Family history of rheumatoid arthritis 06/05/2019   Lateral epicondylitis of left elbow 06/05/2019   Family history of osteoporosis 04/22/2019   Statin intolerance 04/10/2019   Dyspnea on exertion 08/21/2018   Reactive depression 06/26/2018   Insomnia- related to menopause 05/28/2018   Snoring 05/28/2018   Hypersomnia with sleep apnea 05/28/2018   Centrilobular emphysema (HCC) 05/28/2018   Obesity (BMI 30.0-34.9) 05/28/2018   Psychophysiological insomnia 05/28/2018   Controlled diabetes mellitus type 2 with complications (HCC) 05/22/2018   Lung disease, bullous (HCC) 08/14/2017   Aortic atherosclerosis 08/14/2017   Stress incontinence in female 06/09/2017   Overactive bladder 06/09/2017   HYPERTENSION, BENIGN 12/24/2008   Allergic rhinitis 02/25/2008   GERD 11/02/2007   COPD (chronic obstructive pulmonary disease) (HCC) 11/02/2007   PERIMENOPAUSAL SYNDROME 11/02/2007   Rosacea 08/01/2007    Hypothyroidism 06/13/2007   UTI'S, RECURRENT 06/13/2007   REFERRING PROVIDER: Oneil Schillings MD  REFERRING DIAG: Subacromial impingement of right shoulder.  THERAPY DIAG:  Chronic right shoulder pain  Stiffness of right shoulder, not elsewhere classified  Other muscle spasm  Rationale for Evaluation and Treatment: Rehabilitation  ONSET DATE: ~6 months ago.    SUBJECTIVE:  SUBJECTIVE STATEMENT: The patient presents to the clinic with c/o right shoulder pain that has been worsening over the last 6 months after attempting to bath her dog that weight about 60#.  Fortunately, she states a recent injection was very helpful.  Her pain -level today is a low 1-2/10.  Ibuprofen helps decrease her pain.  Pushing, lifting and attempting to sleep on her right shoulder increases her pain.  She decreases the pain as an ache.    PERTINENT HISTORY: Please see above.    PAIN:  Are you having pain? Yes: NPRS scale: 1-2/10. Pain location: Right shoulder. Pain description: Ache.   Aggravating factors: As above. Relieving factors: As above.    PRECAUTIONS: None  RED FLAGS: None   WEIGHT BEARING RESTRICTIONS: No  FALLS:  Has patient fallen in last 6 months? No  LIVING ENVIRONMENT: Lives in: House/apartment Has following equipment at home: None  OCCUPATION: Retired.    PLOF: Independent  PATIENT GOALS:Use right shoulder without pain.    NEXT MD VISIT:   OBJECTIVE:  Note: Objective measures were completed at Evaluation unless otherwise noted.  DIAGNOSTIC FINDINGS:  IMAGING: X-rays right shoulder: No acute osseous abnormality. Well-preserved joint space.  MRI right shoulder: Biceps tenosynovitis. Acromioclavicular arthrosis. Rotator cuff tendinosis, however no discrete tear identified. Signal change  consistent with superior labral pathology.  Formal radiology interpretation pending at the time of this dictation.  ASSESSMENT:   1. Subacromial impingement of right shoulder  2. Biceps tendonitis, right   PATIENT SURVEYS:  Quick DASH:  25 points/31.81%  POSTURE: Generally quite good.    UPPER EXTREMITY ROM:   Active right shoulder antigravity flexion is 130 degrees and left is 145 degrees, ER is full and painful at endrange.  Behind back motion ot sacral region.  She experiences referred pain with certain movements.      UPPER EXTREMITY MMT:  Right shoulder abduction is a solid 4/5.  IR/ER tested with elbow by side is essentially normal.    SHOULDER SPECIAL TESTS: (-) Drop Arm test. No significant pain reproduction with impingement testing (Note:  Recent injection was very helpful).    PALPATION:  Tender over right bicipital groove, posterior cuff and middle deltoid.                                                                                                                               TREATMENT DATE: 07/30/24:  HMP and IFC at 80-150 Hz on 40% scan x 20 minutes. Normal modality resposne following removal of modality.    PATIENT EDUCATION: Education details: Discussed shoulder anatomy.   Person educated: Patient Education method: Explanation  HOME EXERCISE PROGRAM:   ASSESSMENT:  CLINICAL IMPRESSION: The patient presents to [facility] with c/o chronic right shoulder pain over the last several months.  She has some loss of motion.  A recent injection was very helpful. Impingement test did not produce significant pain today.  She is tender to palpation over  her right bicipital groove, posterior cuff and middle deltoid.  Her Quick DASH score is Quick DASH:  25 points/31.81%.  Patient will benefit from skilled physical therapy intervention to address pain and deficits.   OBJECTIVE IMPAIRMENTS: decreased activity tolerance, decreased ROM, decreased strength, increased muscle  spasms, and pain.   ACTIVITY LIMITATIONS: carrying, lifting, and reach over head  PARTICIPATION LIMITATIONS: meal prep, cleaning, laundry, and yard work  PERSONAL FACTORS: Time since onset of injury/illness/exacerbation are also affecting patient's functional outcome.   REHAB POTENTIAL: Good  CLINICAL DECISION MAKING: Evolving/moderate complexity  EVALUATION COMPLEXITY: Low   GOALS:  SHORT TERM GOALS: Target date: 08/13/24.  Ind with an initial HEP. Goal status: INITIAL   LONG TERM GOALS: Target date: 09/10/24  Ind with an advanced HEP.  Goal status: INITIAL  2.  Active right shoulder flexion to 145 degrees so the patient can easily reach overhead.  Goal status: INITIAL  3.  Increase ROM so patient is able to reach behind back to L3.  Goal status: INITIAL  4.  Increase right shoulder strength to a solid 5/5 to increase stability for performance of functional activities.  Goal status: INITIAL  5.  Perform ADL's with pain not > 3/10.  Goal status: INITIAL  6.  Improve Quick DASH score by at least 5 points.  Goal status: INITIAL  PLAN:  PT FREQUENCY: 2x/week  PT DURATION: 6 weeks  PLANNED INTERVENTIONS: 97110-Therapeutic exercises, 97530- Therapeutic activity, [id number]- Neuromuscular re-education, 97535- Self Care, [id number]- Manual therapy, [id number]- Electrical stimulation (unattended), [id number]- Vasopneumatic device, [id number]- Ultrasound, 79439 (1-2 muscles), 20561 (3+ muscles)- Dry Needling, Patient/Family education, Cryotherapy, and Moist heat  PLAN FOR NEXT SESSION: Combo e'stim, STW/M, UE Ranger, PROM. RW4, PRE's.  Modalities as needed.     Raenette Sakata, [place], PT 07/30/2024, 12:07 PM

## 2024-08-02 ENCOUNTER — Encounter: Payer: Self-pay | Admitting: *Deleted

## 2024-08-02 ENCOUNTER — Ambulatory Visit: Admitting: *Deleted

## 2024-08-02 DIAGNOSIS — M25611 Stiffness of right shoulder, not elsewhere classified: Secondary | ICD-10-CM

## 2024-08-02 DIAGNOSIS — M25511 Pain in right shoulder: Secondary | ICD-10-CM | POA: Diagnosis not present

## 2024-08-02 DIAGNOSIS — G8929 Other chronic pain: Secondary | ICD-10-CM

## 2024-08-02 DIAGNOSIS — M62838 Other muscle spasm: Secondary | ICD-10-CM

## 2024-08-02 NOTE — Therapy (Signed)
 OUTPATIENT PHYSICAL THERAPY SHOULDER EVALUATION   Patient Name: Stacy [name] MRN: [id number] DOB:Jul 24, 1956, 68 y.o., female Today's Date: 08/02/2024  END OF SESSION:  PT End of Session - 08/02/24 1230     Visit Number 2    Number of Visits 12    Date for Recertification  09/10/24    PT Start Time 1145    PT Stop Time 1235    PT Time Calculation (min) 50 min          Past Medical History:  Diagnosis Date   Acute on chronic respiratory failure (HCC) 12/09/2022   Allergic rhinitis 02/25/2008   Qualifier: Diagnosis of  By: Waylan DO, Karen     Angina pectoris 06/09/2021   Aortic atherosclerosis 08/14/2017   Appreciated on CXR 08/14/2017   Asthma    Asthmatic bronchitis 12/07/2021   Atrophy of vagina 12/07/2021   CAP (community acquired pneumonia) 12/08/2022   Centrilobular emphysema (HCC) 05/28/2018   Controlled diabetes mellitus type 2 with complications (HCC) 05/22/2018   Has FLD.   COPD with acute exacerbation (HCC) 11/02/2007   Formatting of this note might be different from the original.  PFT 05/28/2019           ratio 50% FEV1 53% FVC 83%    DLCO 54%  PFT 09/25/18 ([facility]) ratio 52% FEV1 56% FVC 108%; DLCO 69%   Coronary artery disease    Dysphagia 12/07/2021   Dyspnea on exertion 08/21/2018   Elevated LFTs 12/11/2022   Essential hypertension 12/07/2021   Facet arthropathy, lumbar 12/15/2021   Family history of osteoporosis 04/22/2019   Family history of rheumatoid arthritis 06/05/2019   Fatty liver disease, nonalcoholic    Ganglion of hand 12/07/2021   GERD 11/02/2007   Qualifier: Diagnosis of  By: Waylan DO, Karen     Hemoptysis 12/08/2022   Hemorrhoids 12/07/2021   Hyperlipidemia associated with type 2 diabetes mellitus (HCC) 03/24/2020   Hypersomnia with sleep apnea 05/28/2018   HYPERTENSION, BENIGN 12/24/2008   Qualifier: Diagnosis of  By: Pietro, MD, CODY Redell Dimes    Hypothyroidism 06/13/2007   ? Hashimotos.  Has strong family hx autoimmune d/o No  h/o thyroidectomy or radioablation.    IBS (irritable bowel syndrome)    Insomnia- related to menopause 05/28/2018   Lactic acid increased 12/09/2022   Lateral epicondylitis of left elbow 06/05/2019   Lung disease, bullous (HCC) 08/14/2017   Menopause 12/07/2021   Obesity (BMI 30.0-34.9) 05/28/2018   Osteopenia of multiple sites 12/15/2021   Overactive bladder 06/09/2017   Pain in female genitalia on intercourse 12/07/2021   Palpitations 08/03/2022   Polyp of colon 12/07/2021   Post-menopausal 04/22/2019   Precordial pain    Sees Dr Pietro.  Last cardiac cath 2016 = negative. Strong family h/o MI and CAD   Psychophysiological insomnia 05/28/2018   Reactive depression 06/26/2018   Rosacea 08/01/2007   Qualifier: Diagnosis of  By: Waylan DO, Karen     Snoring 05/28/2018   Stage 2 moderate COPD by GOLD classification (HCC) 11/02/2007   Formatting of this note might be different from the original. PFT 05/28/2019           ratio 50% FEV1 53% FVC 83%    DLCO 54% PFT 09/25/18 ([facility]) ratio 52% FEV1 56% FVC 108%; DLCO 69%   Statin intolerance 04/10/2019   Stress incontinence in female 06/09/2017   Type 2 MI (myocardial infarction) (HCC) 12/08/2022   UTI'S, RECURRENT 06/13/2007   Qualifier: Diagnosis of  By: Waylan  DO, Karen     Vitamin D  insufficiency 12/15/2021   Past Surgical History:  Procedure Laterality Date   ABLATION     CARDIAC CATHETERIZATION N/A 06/12/2015   Procedure: Left Heart Cath and Coronary Angiography;  Surgeon: Lonni JONETTA Cash, MD;  Location: [facility] INVASIVE CV LAB;  Service: Cardiovascular;  Laterality: N/A;   CESAREAN SECTION     LTCS     x2   NASAL SINUS SURGERY     SPIROMETRY  01/05/2007   FVC 68% predicted; FEV1 44% predicted; FEV1/FVC 63% predicted = moderate obstruction with low vital capacity possibly due to restriction   SQUAMOUS CELL CARCINOMA EXCISION     TUBAL LIGATION     Patient Active Problem List   Diagnosis Date Noted   Precordial pain  12/26/2022   Elevated LFTs 12/11/2022   Lactic acid increased 12/09/2022   Chronic respiratory failure (HCC) 12/09/2022   Hemoptysis 12/08/2022   Type 2 MI (myocardial infarction) (HCC) 12/08/2022   Palpitations 08/03/2022   Osteopenia of multiple sites 12/15/2021   Vitamin D  insufficiency 12/15/2021   Facet arthropathy, lumbar 12/15/2021   Asthma 12/07/2021   Coronary artery disease 12/07/2021   Atrophy of vagina 12/07/2021   Dysphagia 12/07/2021   Ganglion of hand 12/07/2021   Hemorrhoids 12/07/2021   Polyp of colon 12/07/2021   Asthmatic bronchitis 12/07/2021   Pain in female genitalia on intercourse 12/07/2021   Essential hypertension 12/07/2021   Menopause 12/07/2021   Angina pectoris 06/09/2021   Fatty liver disease, nonalcoholic 06/07/2021   IBS (irritable bowel syndrome) 06/07/2021   Hyperlipidemia associated with type 2 diabetes mellitus (HCC) 03/24/2020   Family history of rheumatoid arthritis 06/05/2019   Lateral epicondylitis of left elbow 06/05/2019   Family history of osteoporosis 04/22/2019   Statin intolerance 04/10/2019   Dyspnea on exertion 08/21/2018   Reactive depression 06/26/2018   Insomnia- related to menopause 05/28/2018   Snoring 05/28/2018   Hypersomnia with sleep apnea 05/28/2018   Centrilobular emphysema (HCC) 05/28/2018   Obesity (BMI 30.0-34.9) 05/28/2018   Psychophysiological insomnia 05/28/2018   Controlled diabetes mellitus type 2 with complications (HCC) 05/22/2018   Lung disease, bullous (HCC) 08/14/2017   Aortic atherosclerosis 08/14/2017   Stress incontinence in female 06/09/2017   Overactive bladder 06/09/2017   HYPERTENSION, BENIGN 12/24/2008   Allergic rhinitis 02/25/2008   GERD 11/02/2007   COPD (chronic obstructive pulmonary disease) (HCC) 11/02/2007   PERIMENOPAUSAL SYNDROME 11/02/2007   Rosacea 08/01/2007   Hypothyroidism 06/13/2007   UTI'S, RECURRENT 06/13/2007   REFERRING PROVIDER: Oneil Schillings MD  REFERRING DIAG:  Subacromial impingement of right shoulder.  THERAPY DIAG:  Chronic right shoulder pain  Stiffness of right shoulder, not elsewhere classified  Other muscle spasm  Rationale for Evaluation and Treatment: Rehabilitation  ONSET DATE: ~6 months ago.    SUBJECTIVE:  SUBJECTIVE STATEMENT: The patient reports doing okay after eval  PERTINENT HISTORY: Please see above.    PAIN:  Are you having pain? Yes: NPRS scale: 1-2/10. Pain location: Right shoulder. Pain description: Ache.   Aggravating factors: As above. Relieving factors: As above.    PRECAUTIONS: None  RED FLAGS: None   WEIGHT BEARING RESTRICTIONS: No  FALLS:  Has patient fallen in last 6 months? No  LIVING ENVIRONMENT: Lives in: House/apartment Has following equipment at home: None  OCCUPATION: Retired.    PLOF: Independent  PATIENT GOALS:Use right shoulder without pain.    NEXT MD VISIT:   OBJECTIVE:  Note: Objective measures were completed at Evaluation unless otherwise noted.  DIAGNOSTIC FINDINGS:  IMAGING: X-rays right shoulder: No acute osseous abnormality. Well-preserved joint space.  MRI right shoulder: Biceps tenosynovitis. Acromioclavicular arthrosis. Rotator cuff tendinosis, however no discrete tear identified. Signal change consistent with superior labral pathology.  Formal radiology interpretation pending at the time of this dictation.  ASSESSMENT:   1. Subacromial impingement of right shoulder  2. Biceps tendonitis, right   PATIENT SURVEYS:  Quick DASH:  25 points/31.81%  POSTURE: Generally quite good.    UPPER EXTREMITY ROM:   Active right shoulder antigravity flexion is 130 degrees and left is 145 degrees, ER is full and painful at endrange.  Behind back motion ot sacral region.  She experiences  referred pain with certain movements.      UPPER EXTREMITY MMT:  Right shoulder abduction is a solid 4/5.  IR/ER tested with elbow by side is essentially normal.    SHOULDER SPECIAL TESTS: (-) Drop Arm test. No significant pain reproduction with impingement testing (Note:  Recent injection was very helpful).    PALPATION:  Tender over right bicipital groove, posterior cuff and middle deltoid.                                                                                                                               TREATMENT DATE: 07/30/24:  UBE x 5 mins 120 RPM forward only ER and IR yellow tband 3x10 each US  combo x 8 mins to RT shldr lateral deltoid 1.5 w/cm2 Manual STW to RT shldr musculature x 5 mins   HMP and IFC at 80-150 Hz on 40% scan x 15 minutes. Normal modality resposne following removal of modality.    PATIENT EDUCATION: Education details: Discussed shoulder anatomy.   Person educated: Patient Education method: Explanation  HOME EXERCISE PROGRAM:   ASSESSMENT:  CLINICAL IMPRESSION: The patient presents to [facility] with c/o chronic right shoulder pain over the last several months. Rx focused on light exs , US  combo, STW as well as IFC and moist heat. Pt did well with therex wih mainly fatigue, but no increased pain. Notable soreness along deltoid.  OBJECTIVE IMPAIRMENTS: decreased activity tolerance, decreased ROM, decreased strength, increased muscle spasms, and pain.   ACTIVITY LIMITATIONS: carrying, lifting, and reach over head  PARTICIPATION LIMITATIONS: meal prep, cleaning,  laundry, and yard work  PERSONAL FACTORS: Time since onset of injury/illness/exacerbation are also affecting patient's functional outcome.   REHAB POTENTIAL: Good  CLINICAL DECISION MAKING: Evolving/moderate complexity  EVALUATION COMPLEXITY: Low   GOALS:  SHORT TERM GOALS: Target date: 08/13/24.  Ind with an initial HEP. Goal status: INITIAL   LONG TERM GOALS: Target date:  09/10/24  Ind with an advanced HEP.  Goal status: INITIAL  2.  Active right shoulder flexion to 145 degrees so the patient can easily reach overhead.  Goal status: INITIAL  3.  Increase ROM so patient is able to reach behind back to L3.  Goal status: INITIAL  4.  Increase right shoulder strength to a solid 5/5 to increase stability for performance of functional activities.  Goal status: INITIAL  5.  Perform ADL's with pain not > 3/10.  Goal status: INITIAL  6.  Improve Quick DASH score by at least 5 points.  Goal status: INITIAL  PLAN:  PT FREQUENCY: 2x/week  PT DURATION: 6 weeks  PLANNED INTERVENTIONS: 97110-Therapeutic exercises, 97530- Therapeutic activity, [id number]- Neuromuscular re-education, 97535- Self Care, 02859- Manual therapy, G0283- Electrical stimulation (unattended), 97016- Vasopneumatic device, [id number]- Ultrasound, 79439 (1-2 muscles), 20561 (3+ muscles)- Dry Needling, Patient/Family education, Cryotherapy, and Moist heat  PLAN FOR NEXT SESSION: Combo e'stim, STW/M, UE Ranger, PROM. RW4, PRE's.  Modalities as needed.     Symphani Eckstrom,CHRIS, PTA 08/02/2024, 12:47 PM

## 2024-08-07 ENCOUNTER — Ambulatory Visit: Admitting: *Deleted

## 2024-08-07 ENCOUNTER — Encounter: Payer: Self-pay | Admitting: *Deleted

## 2024-08-07 DIAGNOSIS — M25511 Pain in right shoulder: Secondary | ICD-10-CM | POA: Diagnosis not present

## 2024-08-07 DIAGNOSIS — M62838 Other muscle spasm: Secondary | ICD-10-CM

## 2024-08-07 DIAGNOSIS — M25611 Stiffness of right shoulder, not elsewhere classified: Secondary | ICD-10-CM

## 2024-08-07 DIAGNOSIS — G8929 Other chronic pain: Secondary | ICD-10-CM

## 2024-08-07 NOTE — Therapy (Signed)
 OUTPATIENT PHYSICAL THERAPY SHOULDER EVALUATION   Patient Name: KINSLEA FRANCES MRN: [id number] DOB:05/03/1956, 68 y.o., female Today's Date: 08/07/2024  END OF SESSION:  PT End of Session - 08/07/24 0933     Visit Number 3    Number of Visits 12    Date for Recertification  09/10/24    PT Start Time 0933    PT Stop Time 1030    PT Time Calculation (min) 57 min          Past Medical History:  Diagnosis Date   Acute on chronic respiratory failure (HCC) 12/09/2022   Allergic rhinitis 02/25/2008   Qualifier: Diagnosis of  By: Waylan DO, Karen     Angina pectoris 06/09/2021   Aortic atherosclerosis 08/14/2017   Appreciated on CXR 08/14/2017   Asthma    Asthmatic bronchitis 12/07/2021   Atrophy of vagina 12/07/2021   CAP (community acquired pneumonia) 12/08/2022   Centrilobular emphysema (HCC) 05/28/2018   Controlled diabetes mellitus type 2 with complications (HCC) 05/22/2018   Has FLD.   COPD with acute exacerbation (HCC) 11/02/2007   Formatting of this note might be different from the original.  PFT 05/28/2019           ratio 50% FEV1 53% FVC 83%    DLCO 54%  PFT 09/25/18 ([facility]) ratio 52% FEV1 56% FVC 108%; DLCO 69%   Coronary artery disease    Dysphagia 12/07/2021   Dyspnea on exertion 08/21/2018   Elevated LFTs 12/11/2022   Essential hypertension 12/07/2021   Facet arthropathy, lumbar 12/15/2021   Family history of osteoporosis 04/22/2019   Family history of rheumatoid arthritis 06/05/2019   Fatty liver disease, nonalcoholic    Ganglion of hand 12/07/2021   GERD 11/02/2007   Qualifier: Diagnosis of  By: Waylan DO, Karen     Hemoptysis 12/08/2022   Hemorrhoids 12/07/2021   Hyperlipidemia associated with type 2 diabetes mellitus (HCC) 03/24/2020   Hypersomnia with sleep apnea 05/28/2018   HYPERTENSION, BENIGN 12/24/2008   Qualifier: Diagnosis of  By: Pietro, MD, CODY Redell Dimes    Hypothyroidism 06/13/2007   ? Hashimotos.  Has strong family hx autoimmune d/o No  h/o thyroidectomy or radioablation.    IBS (irritable bowel syndrome)    Insomnia- related to menopause 05/28/2018   Lactic acid increased 12/09/2022   Lateral epicondylitis of left elbow 06/05/2019   Lung disease, bullous (HCC) 08/14/2017   Menopause 12/07/2021   Obesity (BMI 30.0-34.9) 05/28/2018   Osteopenia of multiple sites 12/15/2021   Overactive bladder 06/09/2017   Pain in female genitalia on intercourse 12/07/2021   Palpitations 08/03/2022   Polyp of colon 12/07/2021   Post-menopausal 04/22/2019   Precordial pain    Sees Dr Pietro.  Last cardiac cath 2016 = negative. Strong family h/o MI and CAD   Psychophysiological insomnia 05/28/2018   Reactive depression 06/26/2018   Rosacea 08/01/2007   Qualifier: Diagnosis of  By: Waylan DO, Karen     Snoring 05/28/2018   Stage 2 moderate COPD by GOLD classification (HCC) 11/02/2007   Formatting of this note might be different from the original. PFT 05/28/2019           ratio 50% FEV1 53% FVC 83%    DLCO 54% PFT 09/25/18 ([facility]) ratio 52% FEV1 56% FVC 108%; DLCO 69%   Statin intolerance 04/10/2019   Stress incontinence in female 06/09/2017   Type 2 MI (myocardial infarction) (HCC) 12/08/2022   UTI'S, RECURRENT 06/13/2007   Qualifier: Diagnosis of  By: Waylan  DO, Karen     Vitamin D  insufficiency 12/15/2021   Past Surgical History:  Procedure Laterality Date   ABLATION     CARDIAC CATHETERIZATION N/A 06/12/2015   Procedure: Left Heart Cath and Coronary Angiography;  Surgeon: Lonni JONETTA Cash, MD;  Location: [facility] INVASIVE CV LAB;  Service: Cardiovascular;  Laterality: N/A;   CESAREAN SECTION     LTCS     x2   NASAL SINUS SURGERY     SPIROMETRY  01/05/2007   FVC 68% predicted; FEV1 44% predicted; FEV1/FVC 63% predicted = moderate obstruction with low vital capacity possibly due to restriction   SQUAMOUS CELL CARCINOMA EXCISION     TUBAL LIGATION     Patient Active Problem List   Diagnosis Date Noted   Precordial pain  12/26/2022   Elevated LFTs 12/11/2022   Lactic acid increased 12/09/2022   Chronic respiratory failure (HCC) 12/09/2022   Hemoptysis 12/08/2022   Type 2 MI (myocardial infarction) (HCC) 12/08/2022   Palpitations 08/03/2022   Osteopenia of multiple sites 12/15/2021   Vitamin D  insufficiency 12/15/2021   Facet arthropathy, lumbar 12/15/2021   Asthma 12/07/2021   Coronary artery disease 12/07/2021   Atrophy of vagina 12/07/2021   Dysphagia 12/07/2021   Ganglion of hand 12/07/2021   Hemorrhoids 12/07/2021   Polyp of colon 12/07/2021   Asthmatic bronchitis 12/07/2021   Pain in female genitalia on intercourse 12/07/2021   Essential hypertension 12/07/2021   Menopause 12/07/2021   Angina pectoris 06/09/2021   Fatty liver disease, nonalcoholic 06/07/2021   IBS (irritable bowel syndrome) 06/07/2021   Hyperlipidemia associated with type 2 diabetes mellitus (HCC) 03/24/2020   Family history of rheumatoid arthritis 06/05/2019   Lateral epicondylitis of left elbow 06/05/2019   Family history of osteoporosis 04/22/2019   Statin intolerance 04/10/2019   Dyspnea on exertion 08/21/2018   Reactive depression 06/26/2018   Insomnia- related to menopause 05/28/2018   Snoring 05/28/2018   Hypersomnia with sleep apnea 05/28/2018   Centrilobular emphysema (HCC) 05/28/2018   Obesity (BMI 30.0-34.9) 05/28/2018   Psychophysiological insomnia 05/28/2018   Controlled diabetes mellitus type 2 with complications (HCC) 05/22/2018   Lung disease, bullous (HCC) 08/14/2017   Aortic atherosclerosis 08/14/2017   Stress incontinence in female 06/09/2017   Overactive bladder 06/09/2017   HYPERTENSION, BENIGN 12/24/2008   Allergic rhinitis 02/25/2008   GERD 11/02/2007   COPD (chronic obstructive pulmonary disease) (HCC) 11/02/2007   PERIMENOPAUSAL SYNDROME 11/02/2007   Rosacea 08/01/2007   Hypothyroidism 06/13/2007   UTI'S, RECURRENT 06/13/2007   REFERRING PROVIDER: Oneil Schillings MD  REFERRING DIAG:  Subacromial impingement of right shoulder.  THERAPY DIAG:  Chronic right shoulder pain  Stiffness of right shoulder, not elsewhere classified  Other muscle spasm  Rationale for Evaluation and Treatment: Rehabilitation  ONSET DATE: ~6 months ago.    SUBJECTIVE:  SUBJECTIVE STATEMENT: The patient reports doing okay after last RX  PERTINENT HISTORY: Please see above.    PAIN:  Are you having pain? Yes: NPRS scale: 1-2/10. Pain location: Right shoulder. Pain description: Ache.   Aggravating factors: As above. Relieving factors: As above.    PRECAUTIONS: None  RED FLAGS: None   WEIGHT BEARING RESTRICTIONS: No  FALLS:  Has patient fallen in last 6 months? No  LIVING ENVIRONMENT: Lives in: House/apartment Has following equipment at home: None  OCCUPATION: Retired.    PLOF: Independent  PATIENT GOALS:Use right shoulder without pain.    NEXT MD VISIT:   OBJECTIVE:  Note: Objective measures were completed at Evaluation unless otherwise noted.  DIAGNOSTIC FINDINGS:  IMAGING: X-rays right shoulder: No acute osseous abnormality. Well-preserved joint space.  MRI right shoulder: Biceps tenosynovitis. Acromioclavicular arthrosis. Rotator cuff tendinosis, however no discrete tear identified. Signal change consistent with superior labral pathology.  Formal radiology interpretation pending at the time of this dictation.  ASSESSMENT:   1. Subacromial impingement of right shoulder  2. Biceps tendonitis, right   PATIENT SURVEYS:  Quick DASH:  25 points/31.81%  POSTURE: Generally quite good.    UPPER EXTREMITY ROM:   Active right shoulder antigravity flexion is 130 degrees and left is 145 degrees, ER is full and painful at endrange.  Behind back motion ot sacral region.  She experiences  referred pain with certain movements.      UPPER EXTREMITY MMT:  Right shoulder abduction is a solid 4/5.  IR/ER tested with elbow by side is essentially normal.    SHOULDER SPECIAL TESTS: (-) Drop Arm test. No significant pain reproduction with impingement testing (Note:  Recent injection was very helpful).    PALPATION:  Tender over right bicipital groove, posterior cuff and middle deltoid.                                                                                                                               TREATMENT DATE:  08/07/24:  UBE x 6 mins 120 RPM forward only RW4   yellow tband 3x10 each way US  combo x 8 mins to RT shldr lateral deltoid 1.5 w/cm2 Manual STW to RT shldr musculature x 10 mins   HMP and IFC at 80-150 Hz on 40% scan x 15 minutes. Normal modality resposne following removal of modality.    PATIENT EDUCATION: Education details: Discussed shoulder anatomy.   Person educated: Patient Education method: Explanation  HOME EXERCISE PROGRAM:   ASSESSMENT:  CLINICAL IMPRESSION: Pt arrived today doing good after last Rx and was able to continue with and progress with tband RW4 exs. US  combo, STW was performed  as well as IFC and moist heat. Pt did well with therex progression wih mainly fatigue, but no increased pain.   OBJECTIVE IMPAIRMENTS: decreased activity tolerance, decreased ROM, decreased strength, increased muscle spasms, and pain.   ACTIVITY LIMITATIONS: carrying, lifting, and reach over head  PARTICIPATION LIMITATIONS: meal  prep, cleaning, laundry, and yard work  PERSONAL FACTORS: Time since onset of injury/illness/exacerbation are also affecting patient's functional outcome.   REHAB POTENTIAL: Good  CLINICAL DECISION MAKING: Evolving/moderate complexity  EVALUATION COMPLEXITY: Low   GOALS:  SHORT TERM GOALS: Target date: 08/13/24.  Ind with an initial HEP. Goal status: INITIAL   LONG TERM GOALS: Target date: 09/10/24  Ind  with an advanced HEP.  Goal status: INITIAL  2.  Active right shoulder flexion to 145 degrees so the patient can easily reach overhead.  Goal status: INITIAL  3.  Increase ROM so patient is able to reach behind back to L3.  Goal status: INITIAL  4.  Increase right shoulder strength to a solid 5/5 to increase stability for performance of functional activities.  Goal status: INITIAL  5.  Perform ADL's with pain not > 3/10.  Goal status: INITIAL  6.  Improve Quick DASH score by at least 5 points.  Goal status: INITIAL  PLAN:  PT FREQUENCY: 2x/week  PT DURATION: 6 weeks  PLANNED INTERVENTIONS: 97110-Therapeutic exercises, 97530- Therapeutic activity, [id number]- Neuromuscular re-education, 97535- Self Care, 02859- Manual therapy, G0283- Electrical stimulation (unattended), 97016- Vasopneumatic device, [id number]- Ultrasound, 79439 (1-2 muscles), 20561 (3+ muscles)- Dry Needling, Patient/Family education, Cryotherapy, and Moist heat  PLAN FOR NEXT SESSION: Combo e'stim, STW/M, UE Ranger, PROM. RW4, PRE's.  Modalities as needed.     Trayton Szabo,CHRIS, PTA 08/07/2024, 1:35 PM

## 2024-08-09 ENCOUNTER — Encounter: Payer: Self-pay | Admitting: *Deleted

## 2024-08-09 ENCOUNTER — Ambulatory Visit: Admitting: *Deleted

## 2024-08-09 DIAGNOSIS — M25611 Stiffness of right shoulder, not elsewhere classified: Secondary | ICD-10-CM

## 2024-08-09 DIAGNOSIS — M25511 Pain in right shoulder: Secondary | ICD-10-CM | POA: Diagnosis not present

## 2024-08-09 DIAGNOSIS — G8929 Other chronic pain: Secondary | ICD-10-CM

## 2024-08-09 DIAGNOSIS — M62838 Other muscle spasm: Secondary | ICD-10-CM

## 2024-08-09 NOTE — Therapy (Signed)
 OUTPATIENT PHYSICAL THERAPY SHOULDER TREATMENT   Patient Name: GAYLA BENN MRN: [id number] DOB:1956-04-05, 68 y.o., female Today's Date: 08/09/2024  END OF SESSION:  PT End of Session - 08/09/24 1020     Visit Number 4    Number of Visits 12    Date for Recertification  09/10/24    PT Start Time 1015    PT Stop Time 1115    PT Time Calculation (min) 60 min          Past Medical History:  Diagnosis Date   Acute on chronic respiratory failure (HCC) 12/09/2022   Allergic rhinitis 02/25/2008   Qualifier: Diagnosis of  By: Waylan DO, Karen     Angina pectoris 06/09/2021   Aortic atherosclerosis 08/14/2017   Appreciated on CXR 08/14/2017   Asthma    Asthmatic bronchitis 12/07/2021   Atrophy of vagina 12/07/2021   CAP (community acquired pneumonia) 12/08/2022   Centrilobular emphysema (HCC) 05/28/2018   Controlled diabetes mellitus type 2 with complications (HCC) 05/22/2018   Has FLD.   COPD with acute exacerbation (HCC) 11/02/2007   Formatting of this note might be different from the original.  PFT 05/28/2019           ratio 50% FEV1 53% FVC 83%    DLCO 54%  PFT 09/25/18 ([facility]) ratio 52% FEV1 56% FVC 108%; DLCO 69%   Coronary artery disease    Dysphagia 12/07/2021   Dyspnea on exertion 08/21/2018   Elevated LFTs 12/11/2022   Essential hypertension 12/07/2021   Facet arthropathy, lumbar 12/15/2021   Family history of osteoporosis 04/22/2019   Family history of rheumatoid arthritis 06/05/2019   Fatty liver disease, nonalcoholic    Ganglion of hand 12/07/2021   GERD 11/02/2007   Qualifier: Diagnosis of  By: Waylan DO, Karen     Hemoptysis 12/08/2022   Hemorrhoids 12/07/2021   Hyperlipidemia associated with type 2 diabetes mellitus (HCC) 03/24/2020   Hypersomnia with sleep apnea 05/28/2018   HYPERTENSION, BENIGN 12/24/2008   Qualifier: Diagnosis of  By: Pietro, MD, CODY Redell Dimes    Hypothyroidism 06/13/2007   ? Hashimotos.  Has strong family hx autoimmune d/o No  h/o thyroidectomy or radioablation.    IBS (irritable bowel syndrome)    Insomnia- related to menopause 05/28/2018   Lactic acid increased 12/09/2022   Lateral epicondylitis of left elbow 06/05/2019   Lung disease, bullous (HCC) 08/14/2017   Menopause 12/07/2021   Obesity (BMI 30.0-34.9) 05/28/2018   Osteopenia of multiple sites 12/15/2021   Overactive bladder 06/09/2017   Pain in female genitalia on intercourse 12/07/2021   Palpitations 08/03/2022   Polyp of colon 12/07/2021   Post-menopausal 04/22/2019   Precordial pain    Sees Dr Pietro.  Last cardiac cath 2016 = negative. Strong family h/o MI and CAD   Psychophysiological insomnia 05/28/2018   Reactive depression 06/26/2018   Rosacea 08/01/2007   Qualifier: Diagnosis of  By: Waylan DO, Karen     Snoring 05/28/2018   Stage 2 moderate COPD by GOLD classification (HCC) 11/02/2007   Formatting of this note might be different from the original. PFT 05/28/2019           ratio 50% FEV1 53% FVC 83%    DLCO 54% PFT 09/25/18 ([facility]) ratio 52% FEV1 56% FVC 108%; DLCO 69%   Statin intolerance 04/10/2019   Stress incontinence in female 06/09/2017   Type 2 MI (myocardial infarction) (HCC) 12/08/2022   UTI'S, RECURRENT 06/13/2007   Qualifier: Diagnosis of  By: Waylan  DO, Karen     Vitamin D  insufficiency 12/15/2021   Past Surgical History:  Procedure Laterality Date   ABLATION     CARDIAC CATHETERIZATION N/A 06/12/2015   Procedure: Left Heart Cath and Coronary Angiography;  Surgeon: Lonni JONETTA Cash, MD;  Location: [facility] INVASIVE CV LAB;  Service: Cardiovascular;  Laterality: N/A;   CESAREAN SECTION     LTCS     x2   NASAL SINUS SURGERY     SPIROMETRY  01/05/2007   FVC 68% predicted; FEV1 44% predicted; FEV1/FVC 63% predicted = moderate obstruction with low vital capacity possibly due to restriction   SQUAMOUS CELL CARCINOMA EXCISION     TUBAL LIGATION     Patient Active Problem List   Diagnosis Date Noted   Precordial pain  12/26/2022   Elevated LFTs 12/11/2022   Lactic acid increased 12/09/2022   Chronic respiratory failure (HCC) 12/09/2022   Hemoptysis 12/08/2022   Type 2 MI (myocardial infarction) (HCC) 12/08/2022   Palpitations 08/03/2022   Osteopenia of multiple sites 12/15/2021   Vitamin D  insufficiency 12/15/2021   Facet arthropathy, lumbar 12/15/2021   Asthma 12/07/2021   Coronary artery disease 12/07/2021   Atrophy of vagina 12/07/2021   Dysphagia 12/07/2021   Ganglion of hand 12/07/2021   Hemorrhoids 12/07/2021   Polyp of colon 12/07/2021   Asthmatic bronchitis 12/07/2021   Pain in female genitalia on intercourse 12/07/2021   Essential hypertension 12/07/2021   Menopause 12/07/2021   Angina pectoris 06/09/2021   Fatty liver disease, nonalcoholic 06/07/2021   IBS (irritable bowel syndrome) 06/07/2021   Hyperlipidemia associated with type 2 diabetes mellitus (HCC) 03/24/2020   Family history of rheumatoid arthritis 06/05/2019   Lateral epicondylitis of left elbow 06/05/2019   Family history of osteoporosis 04/22/2019   Statin intolerance 04/10/2019   Dyspnea on exertion 08/21/2018   Reactive depression 06/26/2018   Insomnia- related to menopause 05/28/2018   Snoring 05/28/2018   Hypersomnia with sleep apnea 05/28/2018   Centrilobular emphysema (HCC) 05/28/2018   Obesity (BMI 30.0-34.9) 05/28/2018   Psychophysiological insomnia 05/28/2018   Controlled diabetes mellitus type 2 with complications (HCC) 05/22/2018   Lung disease, bullous (HCC) 08/14/2017   Aortic atherosclerosis 08/14/2017   Stress incontinence in female 06/09/2017   Overactive bladder 06/09/2017   HYPERTENSION, BENIGN 12/24/2008   Allergic rhinitis 02/25/2008   GERD 11/02/2007   COPD (chronic obstructive pulmonary disease) (HCC) 11/02/2007   PERIMENOPAUSAL SYNDROME 11/02/2007   Rosacea 08/01/2007   Hypothyroidism 06/13/2007   UTI'S, RECURRENT 06/13/2007   REFERRING PROVIDER: Oneil Schillings MD  REFERRING DIAG:  Subacromial impingement of right shoulder.  THERAPY DIAG:  Chronic right shoulder pain  Stiffness of right shoulder, not elsewhere classified  Other muscle spasm  Rationale for Evaluation and Treatment: Rehabilitation  ONSET DATE: ~6 months ago.    SUBJECTIVE:  SUBJECTIVE STATEMENT: The patient reports doing okay after last RX 2/10 RT shldr  PERTINENT HISTORY: Please see above.    PAIN:  Are you having pain? Yes: NPRS scale: 1-2/10. Pain location: Right shoulder. Pain description: Ache.   Aggravating factors: As above. Relieving factors: As above.    PRECAUTIONS: None  RED FLAGS: None   WEIGHT BEARING RESTRICTIONS: No  FALLS:  Has patient fallen in last 6 months? No  LIVING ENVIRONMENT: Lives in: House/apartment Has following equipment at home: None  OCCUPATION: Retired.    PLOF: Independent  PATIENT GOALS:Use right shoulder without pain.    NEXT MD VISIT:   OBJECTIVE:  Note: Objective measures were completed at Evaluation unless otherwise noted.  DIAGNOSTIC FINDINGS:  IMAGING: X-rays right shoulder: No acute osseous abnormality. Well-preserved joint space.  MRI right shoulder: Biceps tenosynovitis. Acromioclavicular arthrosis. Rotator cuff tendinosis, however no discrete tear identified. Signal change consistent with superior labral pathology.  Formal radiology interpretation pending at the time of this dictation.  ASSESSMENT:   1. Subacromial impingement of right shoulder  2. Biceps tendonitis, right   PATIENT SURVEYS:  Quick DASH:  25 points/31.81%  POSTURE: Generally quite good.    UPPER EXTREMITY ROM:   Active right shoulder antigravity flexion is 130 degrees and left is 145 degrees, ER is full and painful at endrange.  Behind back motion ot sacral region.  She  experiences referred pain with certain movements.      UPPER EXTREMITY MMT:  Right shoulder abduction is a solid 4/5.  IR/ER tested with elbow by side is essentially normal.    SHOULDER SPECIAL TESTS: (-) Drop Arm test. No significant pain reproduction with impingement testing (Note:  Recent injection was very helpful).    PALPATION:  Tender over right bicipital groove, posterior cuff and middle deltoid.                                                                                                                               TREATMENT DATE:            RT shldr  08/09/24:  UBE x 8 mins 90 RPM forward only RW4   yellow tband 2x  15 each way US  combo x 10 mins to RT shldr lateral deltoid 1.5 w/cm2 Manual STW to RT shldr musculature with focus on posterior cuff x 10 mins   HMP and IFC at 80-150 Hz on 40% scan x 15 minutes. Normal modality resposne following removal of modality.    PATIENT EDUCATION: Education details: Discussed shoulder anatomy.   Person educated: Patient Education method: Explanation  HOME EXERCISE PROGRAM:   ASSESSMENT:  CLINICAL IMPRESSION: Pt arrived today doing good after last Rx with low RT shldr pain levels and was able to continue with therex as well as US  combo, STW and IFC / moist heat. Pt did well with therex progression wih mainly fatigue, but no increased pain. Pt with notable Tp's posterior cuff.  OBJECTIVE  IMPAIRMENTS: decreased activity tolerance, decreased ROM, decreased strength, increased muscle spasms, and pain.   ACTIVITY LIMITATIONS: carrying, lifting, and reach over head  PARTICIPATION LIMITATIONS: meal prep, cleaning, laundry, and yard work  PERSONAL FACTORS: Time since onset of injury/illness/exacerbation are also affecting patient's functional outcome.   REHAB POTENTIAL: Good  CLINICAL DECISION MAKING: Evolving/moderate complexity  EVALUATION COMPLEXITY: Low   GOALS:  SHORT TERM GOALS: Target date: 08/13/24.  Ind with an  initial HEP. Goal status:  MET   LONG TERM GOALS: Target date: 09/10/24  Ind with an advanced HEP.  Goal status: INITIAL  2.  Active right shoulder flexion to 145 degrees so the patient can easily reach overhead.  Goal status: INITIAL  3.  Increase ROM so patient is able to reach behind back to L3.  Goal status: INITIAL  4.  Increase right shoulder strength to a solid 5/5 to increase stability for performance of functional activities.  Goal status: INITIAL  5.  Perform ADL's with pain not > 3/10.  Goal status: INITIAL  6.  Improve Quick DASH score by at least 5 points.  Goal status: INITIAL  PLAN:  PT FREQUENCY: 2x/week  PT DURATION: 6 weeks  PLANNED INTERVENTIONS: 97110-Therapeutic exercises, 97530- Therapeutic activity, [id number]- Neuromuscular re-education, 97535- Self Care, [id number]- Manual therapy, [id number]- Electrical stimulation (unattended), 97016- Vasopneumatic device, [id number]- Ultrasound, 79439 (1-2 muscles), 20561 (3+ muscles)- Dry Needling, Patient/Family education, Cryotherapy, and Moist heat  PLAN FOR NEXT SESSION: Combo e'stim, STW/M, UE Ranger, PROM. RW4, PRE's.  Modalities as needed.     Zaki Gertsch,CHRIS, PTA 08/09/2024, 12:45 PM

## 2024-08-12 ENCOUNTER — Ambulatory Visit

## 2024-08-12 DIAGNOSIS — M62838 Other muscle spasm: Secondary | ICD-10-CM

## 2024-08-12 DIAGNOSIS — M25611 Stiffness of right shoulder, not elsewhere classified: Secondary | ICD-10-CM

## 2024-08-12 DIAGNOSIS — M25511 Pain in right shoulder: Secondary | ICD-10-CM | POA: Diagnosis not present

## 2024-08-12 DIAGNOSIS — G8929 Other chronic pain: Secondary | ICD-10-CM

## 2024-08-12 NOTE — Therapy (Signed)
 OUTPATIENT PHYSICAL THERAPY SHOULDER TREATMENT   Patient Name: Stacy [name] MRN: [id number] DOB:July 23, 1956, 67 y.o., female Today's Date: 08/12/2024  END OF SESSION:  PT End of Session - 08/12/24 1344     Visit Number 5    Number of Visits 12    Date for Recertification  09/10/24    PT Start Time 1341    PT Stop Time 1431    PT Time Calculation (min) 50 min          Past Medical History:  Diagnosis Date   Acute on chronic respiratory failure (HCC) 12/09/2022   Allergic rhinitis 02/25/2008   Qualifier: Diagnosis of  By: Waylan DO, Karen     Angina pectoris 06/09/2021   Aortic atherosclerosis 08/14/2017   Appreciated on CXR 08/14/2017   Asthma    Asthmatic bronchitis 12/07/2021   Atrophy of vagina 12/07/2021   CAP (community acquired pneumonia) 12/08/2022   Centrilobular emphysema (HCC) 05/28/2018   Controlled diabetes mellitus type 2 with complications (HCC) 05/22/2018   Has FLD.   COPD with acute exacerbation (HCC) 11/02/2007   Formatting of this note might be different from the original.  PFT 05/28/2019           ratio 50% FEV1 53% FVC 83%    DLCO 54%  PFT 09/25/18 ([facility]) ratio 52% FEV1 56% FVC 108%; DLCO 69%   Coronary artery disease    Dysphagia 12/07/2021   Dyspnea on exertion 08/21/2018   Elevated LFTs 12/11/2022   Essential hypertension 12/07/2021   Facet arthropathy, lumbar 12/15/2021   Family history of osteoporosis 04/22/2019   Family history of rheumatoid arthritis 06/05/2019   Fatty liver disease, nonalcoholic    Ganglion of hand 12/07/2021   GERD 11/02/2007   Qualifier: Diagnosis of  By: Waylan DO, Karen     Hemoptysis 12/08/2022   Hemorrhoids 12/07/2021   Hyperlipidemia associated with type 2 diabetes mellitus (HCC) 03/24/2020   Hypersomnia with sleep apnea 05/28/2018   HYPERTENSION, BENIGN 12/24/2008   Qualifier: Diagnosis of  By: Pietro, MD, CODY Redell Dimes    Hypothyroidism 06/13/2007   ? Hashimotos.  Has strong family hx autoimmune d/o No  h/o thyroidectomy or radioablation.    IBS (irritable bowel syndrome)    Insomnia- related to menopause 05/28/2018   Lactic acid increased 12/09/2022   Lateral epicondylitis of left elbow 06/05/2019   Lung disease, bullous (HCC) 08/14/2017   Menopause 12/07/2021   Obesity (BMI 30.0-34.9) 05/28/2018   Osteopenia of multiple sites 12/15/2021   Overactive bladder 06/09/2017   Pain in female genitalia on intercourse 12/07/2021   Palpitations 08/03/2022   Polyp of colon 12/07/2021   Post-menopausal 04/22/2019   Precordial pain    Sees Dr Pietro.  Last cardiac cath 2016 = negative. Strong family h/o MI and CAD   Psychophysiological insomnia 05/28/2018   Reactive depression 06/26/2018   Rosacea 08/01/2007   Qualifier: Diagnosis of  By: Waylan DO, Karen     Snoring 05/28/2018   Stage 2 moderate COPD by GOLD classification (HCC) 11/02/2007   Formatting of this note might be different from the original. PFT 05/28/2019           ratio 50% FEV1 53% FVC 83%    DLCO 54% PFT 09/25/18 ([facility]) ratio 52% FEV1 56% FVC 108%; DLCO 69%   Statin intolerance 04/10/2019   Stress incontinence in female 06/09/2017   Type 2 MI (myocardial infarction) (HCC) 12/08/2022   UTI'S, RECURRENT 06/13/2007   Qualifier: Diagnosis of  By: Waylan  DO, Karen     Vitamin D  insufficiency 12/15/2021   Past Surgical History:  Procedure Laterality Date   ABLATION     CARDIAC CATHETERIZATION N/A 06/12/2015   Procedure: Left Heart Cath and Coronary Angiography;  Surgeon: Lonni JONETTA Cash, MD;  Location: [facility] INVASIVE CV LAB;  Service: Cardiovascular;  Laterality: N/A;   CESAREAN SECTION     LTCS     x2   NASAL SINUS SURGERY     SPIROMETRY  01/05/2007   FVC 68% predicted; FEV1 44% predicted; FEV1/FVC 63% predicted = moderate obstruction with low vital capacity possibly due to restriction   SQUAMOUS CELL CARCINOMA EXCISION     TUBAL LIGATION     Patient Active Problem List   Diagnosis Date Noted   Precordial pain  12/26/2022   Elevated LFTs 12/11/2022   Lactic acid increased 12/09/2022   Chronic respiratory failure (HCC) 12/09/2022   Hemoptysis 12/08/2022   Type 2 MI (myocardial infarction) (HCC) 12/08/2022   Palpitations 08/03/2022   Osteopenia of multiple sites 12/15/2021   Vitamin D  insufficiency 12/15/2021   Facet arthropathy, lumbar 12/15/2021   Asthma 12/07/2021   Coronary artery disease 12/07/2021   Atrophy of vagina 12/07/2021   Dysphagia 12/07/2021   Ganglion of hand 12/07/2021   Hemorrhoids 12/07/2021   Polyp of colon 12/07/2021   Asthmatic bronchitis 12/07/2021   Pain in female genitalia on intercourse 12/07/2021   Essential hypertension 12/07/2021   Menopause 12/07/2021   Angina pectoris 06/09/2021   Fatty liver disease, nonalcoholic 06/07/2021   IBS (irritable bowel syndrome) 06/07/2021   Hyperlipidemia associated with type 2 diabetes mellitus (HCC) 03/24/2020   Family history of rheumatoid arthritis 06/05/2019   Lateral epicondylitis of left elbow 06/05/2019   Family history of osteoporosis 04/22/2019   Statin intolerance 04/10/2019   Dyspnea on exertion 08/21/2018   Reactive depression 06/26/2018   Insomnia- related to menopause 05/28/2018   Snoring 05/28/2018   Hypersomnia with sleep apnea 05/28/2018   Centrilobular emphysema (HCC) 05/28/2018   Obesity (BMI 30.0-34.9) 05/28/2018   Psychophysiological insomnia 05/28/2018   Controlled diabetes mellitus type 2 with complications (HCC) 05/22/2018   Lung disease, bullous (HCC) 08/14/2017   Aortic atherosclerosis 08/14/2017   Stress incontinence in female 06/09/2017   Overactive bladder 06/09/2017   HYPERTENSION, BENIGN 12/24/2008   Allergic rhinitis 02/25/2008   GERD 11/02/2007   COPD (chronic obstructive pulmonary disease) (HCC) 11/02/2007   PERIMENOPAUSAL SYNDROME 11/02/2007   Rosacea 08/01/2007   Hypothyroidism 06/13/2007   UTI'S, RECURRENT 06/13/2007   REFERRING PROVIDER: Oneil Schillings MD  REFERRING DIAG:  Subacromial impingement of right shoulder.  THERAPY DIAG:  Chronic right shoulder pain  Stiffness of right shoulder, not elsewhere classified  Other muscle spasm  Rationale for Evaluation and Treatment: Rehabilitation  ONSET DATE: ~6 months ago.    SUBJECTIVE:  SUBJECTIVE STATEMENT: Pt reports feeling good without pain today.  PERTINENT HISTORY: Please see above.    PAIN:  Are you having pain? No  PRECAUTIONS: None  RED FLAGS: None   WEIGHT BEARING RESTRICTIONS: No  FALLS:  Has patient fallen in last 6 months? No  LIVING ENVIRONMENT: Lives in: House/apartment Has following equipment at home: None  OCCUPATION: Retired.    PLOF: Independent  PATIENT GOALS:Use right shoulder without pain.    NEXT MD VISIT:   OBJECTIVE:  Note: Objective measures were completed at Evaluation unless otherwise noted.  DIAGNOSTIC FINDINGS:  IMAGING: X-rays right shoulder: No acute osseous abnormality. Well-preserved joint space.  MRI right shoulder: Biceps tenosynovitis. Acromioclavicular arthrosis. Rotator cuff tendinosis, however no discrete tear identified. Signal change consistent with superior labral pathology.  Formal radiology interpretation pending at the time of this dictation.  ASSESSMENT:   1. Subacromial impingement of right shoulder  2. Biceps tendonitis, right   PATIENT SURVEYS:  Quick DASH:  25 points/31.81%  POSTURE: Generally quite good.    UPPER EXTREMITY ROM:   Active right shoulder antigravity flexion is 130 degrees and left is 145 degrees, ER is full and painful at endrange.  Behind back motion ot sacral region.  She experiences referred pain with certain movements.      UPPER EXTREMITY MMT:  Right shoulder abduction is a solid 4/5.  IR/ER tested with elbow by side is  essentially normal.    SHOULDER SPECIAL TESTS: (-) Drop Arm test. No significant pain reproduction with impingement testing (Note:  Recent injection was very helpful).    PALPATION:  Tender over right bicipital groove, posterior cuff and middle deltoid.                                                                                                                             TREATMENT DATE:            RT shldr  08/12/24                                  EXERCISE LOG  Exercise Repetitions and Resistance Comments  UBE 10 mins (forward/backward)   Pulleys 5 mins   Ball on Wall 2 mins   RW 4  Yellow x 20 reps    Shoulder Abduction 1# x 20 reps    Goal Assessment See Below    Blank cell = exercise not performed today    Modalities  Date:  Unattended Estim: Shoulder, IFC 80-150 hz, 15 mins, Pain and Tone Hot Pack: Shoulder, 15 mins, Pain and Tone   08/09/24:  UBE x 8 mins 90 RPM forward only RW4   yellow tband 2x  15 each way US  combo x 10 mins to RT shldr lateral deltoid 1.5 w/cm2 Manual STW to RT shldr musculature with focus on posterior cuff x 10 mins   HMP and IFC at 80-150 Hz  on 40% scan x 15 minutes. Normal modality resposne following removal of modality.    PATIENT EDUCATION: Education details: Discussed shoulder anatomy.   Person educated: Patient Education method: Explanation  HOME EXERCISE PROGRAM:   ASSESSMENT:  CLINICAL IMPRESSION: Pt arrives for today's treatment session denying any pain.  Pt reports that shoulder is doing well.   Pt able to demonstrate 132 degrees of right shoulder flexion today as well as 5/5 global right shoulder strength, except right shoulder abduction.  Pt states that her average shoulder pain while performing ADLs is approximately 4/10.  Pt's QuickDash score is the exact same, 31.8%, as on eval.  Pt introduced to ball on the wall and resisted shoulder abduction today with min cues for technique.  Pt requiring cues for pacing and full ROM  with pulleys.  Normal responses to estim and MH noted upon removal.  Pt denied any pain at completion of today's treatment session.   OBJECTIVE IMPAIRMENTS: decreased activity tolerance, decreased ROM, decreased strength, increased muscle spasms, and pain.   ACTIVITY LIMITATIONS: carrying, lifting, and reach over head  PARTICIPATION LIMITATIONS: meal prep, cleaning, laundry, and yard work  PERSONAL FACTORS: Time since onset of injury/illness/exacerbation are also affecting patient's functional outcome.   REHAB POTENTIAL: Good  CLINICAL DECISION MAKING: Evolving/moderate complexity  EVALUATION COMPLEXITY: Low   GOALS:  SHORT TERM GOALS: Target date: 08/13/24.  Ind with an initial HEP. Goal status:  MET   LONG TERM GOALS: Target date: 09/10/24  Ind with an advanced HEP.  Goal status: IN PROGRESS  2.  Active right shoulder flexion to 145 degrees so the patient can easily reach overhead.   10/20: 132 degrees Goal status: IN PROGRESS  3.  Increase ROM so patient is able to reach behind back to L3.  Goal status: MET  4.  Increase right shoulder strength to a solid 5/5 to increase stability for performance of functional activities.   10/20: 5/5 in all directions except abduction Goal status: IN PROGRESS  5.  Perform ADL's with pain not > 3/10.   10/20: 4/10 Goal status: IN PROGRESS  6.  Improve Quick DASH score by at least 5 points. 10/20: 31.8%  Goal status: IN PROGRESS  PLAN:  PT FREQUENCY: 2x/week  PT DURATION: 6 weeks  PLANNED INTERVENTIONS: 97110-Therapeutic exercises, 97530- Therapeutic activity, [id number]- Neuromuscular re-education, 97535- Self Care, 02859- Manual therapy, [id number]- Electrical stimulation (unattended), 97016- Vasopneumatic device, [id number]- Ultrasound, 79439 (1-2 muscles), 20561 (3+ muscles)- Dry Needling, Patient/Family education, Cryotherapy, and Moist heat  PLAN FOR NEXT SESSION: Combo e'stim, STW/M, UE Ranger, PROM. RW4, PRE's.  Modalities as  needed.     Delon DELENA Gosling, PTA 08/12/2024, 3:21 PM

## 2024-08-20 ENCOUNTER — Ambulatory Visit

## 2024-08-20 DIAGNOSIS — M25611 Stiffness of right shoulder, not elsewhere classified: Secondary | ICD-10-CM

## 2024-08-20 DIAGNOSIS — G8929 Other chronic pain: Secondary | ICD-10-CM

## 2024-08-20 DIAGNOSIS — M62838 Other muscle spasm: Secondary | ICD-10-CM

## 2024-08-20 DIAGNOSIS — M25511 Pain in right shoulder: Secondary | ICD-10-CM | POA: Diagnosis not present

## 2024-08-20 NOTE — Therapy (Signed)
 OUTPATIENT PHYSICAL THERAPY SHOULDER TREATMENT   Patient Name: Stacy [name] MRN: [id number] DOB:September 27, 1956, 68 y.o., female Today's Date: 08/20/2024  END OF SESSION:  PT End of Session - 08/20/24 1428     Visit Number 6    Number of Visits 12    Date for Recertification  09/10/24    PT Start Time 1426    PT Stop Time 1519    PT Time Calculation (min) 53 min          Past Medical History:  Diagnosis Date   Acute on chronic respiratory failure (HCC) 12/09/2022   Allergic rhinitis 02/25/2008   Qualifier: Diagnosis of  By: Waylan DO, Karen     Angina pectoris 06/09/2021   Aortic atherosclerosis 08/14/2017   Appreciated on CXR 08/14/2017   Asthma    Asthmatic bronchitis 12/07/2021   Atrophy of vagina 12/07/2021   CAP (community acquired pneumonia) 12/08/2022   Centrilobular emphysema (HCC) 05/28/2018   Controlled diabetes mellitus type 2 with complications (HCC) 05/22/2018   Has FLD.   COPD with acute exacerbation (HCC) 11/02/2007   Formatting of this note might be different from the original.  PFT 05/28/2019           ratio 50% FEV1 53% FVC 83%    DLCO 54%  PFT 09/25/18 ([facility]) ratio 52% FEV1 56% FVC 108%; DLCO 69%   Coronary artery disease    Dysphagia 12/07/2021   Dyspnea on exertion 08/21/2018   Elevated LFTs 12/11/2022   Essential hypertension 12/07/2021   Facet arthropathy, lumbar 12/15/2021   Family history of osteoporosis 04/22/2019   Family history of rheumatoid arthritis 06/05/2019   Fatty liver disease, nonalcoholic    Ganglion of hand 12/07/2021   GERD 11/02/2007   Qualifier: Diagnosis of  By: Waylan DO, Karen     Hemoptysis 12/08/2022   Hemorrhoids 12/07/2021   Hyperlipidemia associated with type 2 diabetes mellitus (HCC) 03/24/2020   Hypersomnia with sleep apnea 05/28/2018   HYPERTENSION, BENIGN 12/24/2008   Qualifier: Diagnosis of  By: Pietro, MD, CODY Redell Dimes    Hypothyroidism 06/13/2007   ? Hashimotos.  Has strong family hx autoimmune d/o No  h/o thyroidectomy or radioablation.    IBS (irritable bowel syndrome)    Insomnia- related to menopause 05/28/2018   Lactic acid increased 12/09/2022   Lateral epicondylitis of left elbow 06/05/2019   Lung disease, bullous (HCC) 08/14/2017   Menopause 12/07/2021   Obesity (BMI 30.0-34.9) 05/28/2018   Osteopenia of multiple sites 12/15/2021   Overactive bladder 06/09/2017   Pain in female genitalia on intercourse 12/07/2021   Palpitations 08/03/2022   Polyp of colon 12/07/2021   Post-menopausal 04/22/2019   Precordial pain    Sees Dr Pietro.  Last cardiac cath 2016 = negative. Strong family h/o MI and CAD   Psychophysiological insomnia 05/28/2018   Reactive depression 06/26/2018   Rosacea 08/01/2007   Qualifier: Diagnosis of  By: Waylan DO, Karen     Snoring 05/28/2018   Stage 2 moderate COPD by GOLD classification (HCC) 11/02/2007   Formatting of this note might be different from the original. PFT 05/28/2019           ratio 50% FEV1 53% FVC 83%    DLCO 54% PFT 09/25/18 ([facility]) ratio 52% FEV1 56% FVC 108%; DLCO 69%   Statin intolerance 04/10/2019   Stress incontinence in female 06/09/2017   Type 2 MI (myocardial infarction) (HCC) 12/08/2022   UTI'S, RECURRENT 06/13/2007   Qualifier: Diagnosis of  By: Waylan  DO, Karen     Vitamin D  insufficiency 12/15/2021   Past Surgical History:  Procedure Laterality Date   ABLATION     CARDIAC CATHETERIZATION N/A 06/12/2015   Procedure: Left Heart Cath and Coronary Angiography;  Surgeon: Lonni JONETTA Cash, MD;  Location: [facility] INVASIVE CV LAB;  Service: Cardiovascular;  Laterality: N/A;   CESAREAN SECTION     LTCS     x2   NASAL SINUS SURGERY     SPIROMETRY  01/05/2007   FVC 68% predicted; FEV1 44% predicted; FEV1/FVC 63% predicted = moderate obstruction with low vital capacity possibly due to restriction   SQUAMOUS CELL CARCINOMA EXCISION     TUBAL LIGATION     Patient Active Problem List   Diagnosis Date Noted   Precordial pain  12/26/2022   Elevated LFTs 12/11/2022   Lactic acid increased 12/09/2022   Chronic respiratory failure (HCC) 12/09/2022   Hemoptysis 12/08/2022   Type 2 MI (myocardial infarction) (HCC) 12/08/2022   Palpitations 08/03/2022   Osteopenia of multiple sites 12/15/2021   Vitamin D  insufficiency 12/15/2021   Facet arthropathy, lumbar 12/15/2021   Asthma 12/07/2021   Coronary artery disease 12/07/2021   Atrophy of vagina 12/07/2021   Dysphagia 12/07/2021   Ganglion of hand 12/07/2021   Hemorrhoids 12/07/2021   Polyp of colon 12/07/2021   Asthmatic bronchitis 12/07/2021   Pain in female genitalia on intercourse 12/07/2021   Essential hypertension 12/07/2021   Menopause 12/07/2021   Angina pectoris 06/09/2021   Fatty liver disease, nonalcoholic 06/07/2021   IBS (irritable bowel syndrome) 06/07/2021   Hyperlipidemia associated with type 2 diabetes mellitus (HCC) 03/24/2020   Family history of rheumatoid arthritis 06/05/2019   Lateral epicondylitis of left elbow 06/05/2019   Family history of osteoporosis 04/22/2019   Statin intolerance 04/10/2019   Dyspnea on exertion 08/21/2018   Reactive depression 06/26/2018   Insomnia- related to menopause 05/28/2018   Snoring 05/28/2018   Hypersomnia with sleep apnea 05/28/2018   Centrilobular emphysema (HCC) 05/28/2018   Obesity (BMI 30.0-34.9) 05/28/2018   Psychophysiological insomnia 05/28/2018   Controlled diabetes mellitus type 2 with complications (HCC) 05/22/2018   Lung disease, bullous (HCC) 08/14/2017   Aortic atherosclerosis 08/14/2017   Stress incontinence in female 06/09/2017   Overactive bladder 06/09/2017   HYPERTENSION, BENIGN 12/24/2008   Allergic rhinitis 02/25/2008   GERD 11/02/2007   COPD (chronic obstructive pulmonary disease) (HCC) 11/02/2007   PERIMENOPAUSAL SYNDROME 11/02/2007   Rosacea 08/01/2007   Hypothyroidism 06/13/2007   UTI'S, RECURRENT 06/13/2007   REFERRING PROVIDER: Oneil Schillings MD  REFERRING DIAG:  Subacromial impingement of right shoulder.  THERAPY DIAG:  Chronic right shoulder pain  Stiffness of right shoulder, not elsewhere classified  Other muscle spasm  Rationale for Evaluation and Treatment: Rehabilitation  ONSET DATE: ~6 months ago.    SUBJECTIVE:  SUBJECTIVE STATEMENT: Pt reports feeling good without pain today.  PERTINENT HISTORY: Please see above.    PAIN:  Are you having pain? No  PRECAUTIONS: None  RED FLAGS: None   WEIGHT BEARING RESTRICTIONS: No  FALLS:  Has patient fallen in last 6 months? No  LIVING ENVIRONMENT: Lives in: House/apartment Has following equipment at home: None  OCCUPATION: Retired.    PLOF: Independent  PATIENT GOALS:Use right shoulder without pain.    NEXT MD VISIT:   OBJECTIVE:  Note: Objective measures were completed at Evaluation unless otherwise noted.  DIAGNOSTIC FINDINGS:  IMAGING: X-rays right shoulder: No acute osseous abnormality. Well-preserved joint space.  MRI right shoulder: Biceps tenosynovitis. Acromioclavicular arthrosis. Rotator cuff tendinosis, however no discrete tear identified. Signal change consistent with superior labral pathology.  Formal radiology interpretation pending at the time of this dictation.  ASSESSMENT:   1. Subacromial impingement of right shoulder  2. Biceps tendonitis, right   PATIENT SURVEYS:  Quick DASH:  25 points/31.81%  POSTURE: Generally quite good.    UPPER EXTREMITY ROM:   Active right shoulder antigravity flexion is 130 degrees and left is 145 degrees, ER is full and painful at endrange.  Behind back motion ot sacral region.  She experiences referred pain with certain movements.      UPPER EXTREMITY MMT:  Right shoulder abduction is a solid 4/5.  IR/ER tested with elbow by side is  essentially normal.    SHOULDER SPECIAL TESTS: (-) Drop Arm test. No significant pain reproduction with impingement testing (Note:  Recent injection was very helpful).    PALPATION:  Tender over right bicipital groove, posterior cuff and middle deltoid.                                                                                                                             TREATMENT DATE:            RT shldr  08/20/24                                  EXERCISE LOG  Exercise Repetitions and Resistance Comments  UBE 10 mins 90 RPM (forward/backward)   Pulleys 5 mins   Ranger 5 mins   Ball on Wall 2.5 mins   RW 4  Yellow x 25 reps each way   Shoulder Abduction 1# x 25 reps    Therabar Up/Down 20 reps each   Therabar Twists 20 reps each   Simulated Driving 1 min x 2 reps   Goal Assessment     Blank cell = exercise not performed today    Modalities  Date:  Hot Pack: Shoulder, 15 mins, Pain and Tone   08/09/24:  UBE x 8 mins 90 RPM forward only RW4   yellow tband 2x  15 each way US  combo x 10 mins to RT shldr lateral deltoid 1.5 w/cm2 Manual STW to  RT shldr musculature with focus on posterior cuff x 10 mins   HMP and IFC at 80-150 Hz on 40% scan x 15 minutes. Normal modality resposne following removal of modality.    PATIENT EDUCATION: Education details: Discussed shoulder anatomy.   Person educated: Patient Education method: Explanation  HOME EXERCISE PROGRAM:   ASSESSMENT:  CLINICAL IMPRESSION: Pt arrives for today's treatment session reporting 3/10 right shoulder pain.  Pt able to tolerate increased time and reps with several previously performed exercises.  Pt reports increased challenge with driving and turning the steering wheel.  Several exercises performed today to simulate driving and strengthen the muscle groups involved.  Pt reported 1-2/10 right shoulder pain at completion of today's treatment session.   OBJECTIVE IMPAIRMENTS: decreased activity tolerance,  decreased ROM, decreased strength, increased muscle spasms, and pain.   ACTIVITY LIMITATIONS: carrying, lifting, and reach over head  PARTICIPATION LIMITATIONS: meal prep, cleaning, laundry, and yard work  PERSONAL FACTORS: Time since onset of injury/illness/exacerbation are also affecting patient's functional outcome.   REHAB POTENTIAL: Good  CLINICAL DECISION MAKING: Evolving/moderate complexity  EVALUATION COMPLEXITY: Low   GOALS:  SHORT TERM GOALS: Target date: 08/13/24.  Ind with an initial HEP. Goal status:  MET   LONG TERM GOALS: Target date: 09/10/24  Ind with an advanced HEP.  Goal status: IN PROGRESS  2.  Active right shoulder flexion to 145 degrees so the patient can easily reach overhead.   10/20: 132 degrees Goal status: IN PROGRESS  3.  Increase ROM so patient is able to reach behind back to L3.  Goal status: MET  4.  Increase right shoulder strength to a solid 5/5 to increase stability for performance of functional activities.   10/20: 5/5 in all directions except abduction Goal status: IN PROGRESS  5.  Perform ADL's with pain not > 3/10.   10/20: 4/10 Goal status: IN PROGRESS  6.  Improve Quick DASH score by at least 5 points. 10/20: 31.8%  Goal status: IN PROGRESS  PLAN:  PT FREQUENCY: 2x/week  PT DURATION: 6 weeks  PLANNED INTERVENTIONS: 97110-Therapeutic exercises, 97530- Therapeutic activity, [id number]- Neuromuscular re-education, 97535- Self Care, [id number]- Manual therapy, [id number]- Electrical stimulation (unattended), 97016- Vasopneumatic device, [id number]- Ultrasound, 79439 (1-2 muscles), 20561 (3+ muscles)- Dry Needling, Patient/Family education, Cryotherapy, and Moist heat  PLAN FOR NEXT SESSION: Combo e'stim, STW/M, UE Ranger, PROM. RW4, PRE's.  Modalities as needed.     Delon DELENA Gosling, PTA 08/20/2024, 3:23 PM

## 2024-08-21 ENCOUNTER — Encounter: Payer: Self-pay | Admitting: Physical Therapy

## 2024-08-21 ENCOUNTER — Ambulatory Visit: Admitting: Physical Therapy

## 2024-08-21 DIAGNOSIS — G8929 Other chronic pain: Secondary | ICD-10-CM

## 2024-08-21 DIAGNOSIS — M62838 Other muscle spasm: Secondary | ICD-10-CM

## 2024-08-21 DIAGNOSIS — M25611 Stiffness of right shoulder, not elsewhere classified: Secondary | ICD-10-CM

## 2024-08-21 DIAGNOSIS — M25511 Pain in right shoulder: Secondary | ICD-10-CM | POA: Diagnosis not present

## 2024-08-21 NOTE — Therapy (Signed)
 OUTPATIENT PHYSICAL THERAPY SHOULDER TREATMENT   Patient Name: Stacy [name] MRN: [id number] DOB:1955/11/09, 68 y.o., female Today's Date: 08/21/2024  END OF SESSION:  PT End of Session - 08/21/24 1513     Visit Number 7    Number of Visits 12    Date for Recertification  09/10/24    PT Start Time 1515    PT Stop Time 1555    PT Time Calculation (min) 40 min           Past Medical History:  Diagnosis Date   Acute on chronic respiratory failure (HCC) 12/09/2022   Allergic rhinitis 02/25/2008   Qualifier: Diagnosis of  By: Waylan DO, Karen     Angina pectoris 06/09/2021   Aortic atherosclerosis 08/14/2017   Appreciated on CXR 08/14/2017   Asthma    Asthmatic bronchitis 12/07/2021   Atrophy of vagina 12/07/2021   CAP (community acquired pneumonia) 12/08/2022   Centrilobular emphysema (HCC) 05/28/2018   Controlled diabetes mellitus type 2 with complications (HCC) 05/22/2018   Has FLD.   COPD with acute exacerbation (HCC) 11/02/2007   Formatting of this note might be different from the original.  PFT 05/28/2019           ratio 50% FEV1 53% FVC 83%    DLCO 54%  PFT 09/25/18 ([facility]) ratio 52% FEV1 56% FVC 108%; DLCO 69%   Coronary artery disease    Dysphagia 12/07/2021   Dyspnea on exertion 08/21/2018   Elevated LFTs 12/11/2022   Essential hypertension 12/07/2021   Facet arthropathy, lumbar 12/15/2021   Family history of osteoporosis 04/22/2019   Family history of rheumatoid arthritis 06/05/2019   Fatty liver disease, nonalcoholic    Ganglion of hand 12/07/2021   GERD 11/02/2007   Qualifier: Diagnosis of  By: Waylan DO, Karen     Hemoptysis 12/08/2022   Hemorrhoids 12/07/2021   Hyperlipidemia associated with type 2 diabetes mellitus (HCC) 03/24/2020   Hypersomnia with sleep apnea 05/28/2018   HYPERTENSION, BENIGN 12/24/2008   Qualifier: Diagnosis of  By: Pietro, MD, CODY Redell Dimes    Hypothyroidism 06/13/2007   ? Hashimotos.  Has strong family hx autoimmune d/o  No h/o thyroidectomy or radioablation.    IBS (irritable bowel syndrome)    Insomnia- related to menopause 05/28/2018   Lactic acid increased 12/09/2022   Lateral epicondylitis of left elbow 06/05/2019   Lung disease, bullous (HCC) 08/14/2017   Menopause 12/07/2021   Obesity (BMI 30.0-34.9) 05/28/2018   Osteopenia of multiple sites 12/15/2021   Overactive bladder 06/09/2017   Pain in female genitalia on intercourse 12/07/2021   Palpitations 08/03/2022   Polyp of colon 12/07/2021   Post-menopausal 04/22/2019   Precordial pain    Sees Dr Pietro.  Last cardiac cath 2016 = negative. Strong family h/o MI and CAD   Psychophysiological insomnia 05/28/2018   Reactive depression 06/26/2018   Rosacea 08/01/2007   Qualifier: Diagnosis of  By: Waylan DO, Karen     Snoring 05/28/2018   Stage 2 moderate COPD by GOLD classification (HCC) 11/02/2007   Formatting of this note might be different from the original. PFT 05/28/2019           ratio 50% FEV1 53% FVC 83%    DLCO 54% PFT 09/25/18 ([facility]) ratio 52% FEV1 56% FVC 108%; DLCO 69%   Statin intolerance 04/10/2019   Stress incontinence in female 06/09/2017   Type 2 MI (myocardial infarction) (HCC) 12/08/2022   UTI'S, RECURRENT 06/13/2007   Qualifier: Diagnosis of  By:  Bowen DO, Karen     Vitamin D  insufficiency 12/15/2021   Past Surgical History:  Procedure Laterality Date   ABLATION     CARDIAC CATHETERIZATION N/A 06/12/2015   Procedure: Left Heart Cath and Coronary Angiography;  Surgeon: Lonni JONETTA Cash, MD;  Location: [facility] INVASIVE CV LAB;  Service: Cardiovascular;  Laterality: N/A;   CESAREAN SECTION     LTCS     x2   NASAL SINUS SURGERY     SPIROMETRY  01/05/2007   FVC 68% predicted; FEV1 44% predicted; FEV1/FVC 63% predicted = moderate obstruction with low vital capacity possibly due to restriction   SQUAMOUS CELL CARCINOMA EXCISION     TUBAL LIGATION     Patient Active Problem List   Diagnosis Date Noted   Precordial pain  12/26/2022   Elevated LFTs 12/11/2022   Lactic acid increased 12/09/2022   Chronic respiratory failure (HCC) 12/09/2022   Hemoptysis 12/08/2022   Type 2 MI (myocardial infarction) (HCC) 12/08/2022   Palpitations 08/03/2022   Osteopenia of multiple sites 12/15/2021   Vitamin D  insufficiency 12/15/2021   Facet arthropathy, lumbar 12/15/2021   Asthma 12/07/2021   Coronary artery disease 12/07/2021   Atrophy of vagina 12/07/2021   Dysphagia 12/07/2021   Ganglion of hand 12/07/2021   Hemorrhoids 12/07/2021   Polyp of colon 12/07/2021   Asthmatic bronchitis 12/07/2021   Pain in female genitalia on intercourse 12/07/2021   Essential hypertension 12/07/2021   Menopause 12/07/2021   Angina pectoris 06/09/2021   Fatty liver disease, nonalcoholic 06/07/2021   IBS (irritable bowel syndrome) 06/07/2021   Hyperlipidemia associated with type 2 diabetes mellitus (HCC) 03/24/2020   Family history of rheumatoid arthritis 06/05/2019   Lateral epicondylitis of left elbow 06/05/2019   Family history of osteoporosis 04/22/2019   Statin intolerance 04/10/2019   Dyspnea on exertion 08/21/2018   Reactive depression 06/26/2018   Insomnia- related to menopause 05/28/2018   Snoring 05/28/2018   Hypersomnia with sleep apnea 05/28/2018   Centrilobular emphysema (HCC) 05/28/2018   Obesity (BMI 30.0-34.9) 05/28/2018   Psychophysiological insomnia 05/28/2018   Controlled diabetes mellitus type 2 with complications (HCC) 05/22/2018   Lung disease, bullous (HCC) 08/14/2017   Aortic atherosclerosis 08/14/2017   Stress incontinence in female 06/09/2017   Overactive bladder 06/09/2017   HYPERTENSION, BENIGN 12/24/2008   Allergic rhinitis 02/25/2008   GERD 11/02/2007   COPD (chronic obstructive pulmonary disease) (HCC) 11/02/2007   PERIMENOPAUSAL SYNDROME 11/02/2007   Rosacea 08/01/2007   Hypothyroidism 06/13/2007   UTI'S, RECURRENT 06/13/2007   REFERRING PROVIDER: Oneil Schillings MD  REFERRING DIAG:  Subacromial impingement of right shoulder.  THERAPY DIAG:  Chronic right shoulder pain  Stiffness of right shoulder, not elsewhere classified  Other muscle spasm  Rationale for Evaluation and Treatment: Rehabilitation  ONSET DATE: ~6 months ago.    SUBJECTIVE:  SUBJECTIVE STATEMENT: Pt states she is doing well with no issues. A little sore yesterday but feeling good today.  PERTINENT HISTORY: Please see above.    PAIN:  Are you having pain? No  PRECAUTIONS: None  RED FLAGS: None   WEIGHT BEARING RESTRICTIONS: No  FALLS:  Has patient fallen in last 6 months? No  LIVING ENVIRONMENT: Lives in: House/apartment Has following equipment at home: None  OCCUPATION: Retired.    PLOF: Independent  PATIENT GOALS:Use right shoulder without pain.    NEXT MD VISIT:   OBJECTIVE:  Note: Objective measures were completed at Evaluation unless otherwise noted.  DIAGNOSTIC FINDINGS:  IMAGING: X-rays right shoulder: No acute osseous abnormality. Well-preserved joint space.  MRI right shoulder: Biceps tenosynovitis. Acromioclavicular arthrosis. Rotator cuff tendinosis, however no discrete tear identified. Signal change consistent with superior labral pathology.  Formal radiology interpretation pending at the time of this dictation.  ASSESSMENT:   1. Subacromial impingement of right shoulder  2. Biceps tendonitis, right   PATIENT SURVEYS:  Quick DASH:  25 points/31.81%  POSTURE: Generally quite good.    UPPER EXTREMITY ROM:   Active right shoulder antigravity flexion is 130 degrees and left is 145 degrees, ER is full and painful at endrange.  Behind back motion ot sacral region.  She experiences referred pain with certain movements.      UPPER EXTREMITY MMT:  Right shoulder abduction is a  solid 4/5.  IR/ER tested with elbow by side is essentially normal.    SHOULDER SPECIAL TESTS: (-) Drop Arm test. No significant pain reproduction with impingement testing (Note:  Recent injection was very helpful).    PALPATION:  Tender over right bicipital groove, posterior cuff and middle deltoid.                                                                                                                             TREATMENT DATE:            RT shldr  08/21/24                                  EXERCISE LOG  Exercise Repetitions and Resistance Comments  UBE 10 mins 90 RPM (forward/backward)   Pulleys 5 mins   Seated Ranger 5 mins   Ball on Wall 3 mins Up/down and then circles  PNF D1 yellow TB 2x10 R&L   PNF D2 yellow TB  2x10 R&L   Therabar Up/Down 20 reps each   Therabar C 20 reps each   Simulated Driving with red therabar 1 min x 2 reps in U and in n positions        Blank cell = exercise not performed today      08/20/24  EXERCISE LOG  Exercise Repetitions and Resistance Comments  UBE 10 mins 90 RPM (forward/backward)   Pulleys 5 mins   Ranger 5 mins   Ball on Wall 2.5 mins   RW 4  Yellow x 25 reps each way   Shoulder Abduction 1# x 25 reps    Therabar Up/Down 20 reps each   Therabar Twists 20 reps each   Simulated Driving 1 min x 2 reps   Goal Assessment     Blank cell = exercise not performed today    Modalities  Date:  Hot Pack: Shoulder, 15 mins, Pain and Tone   08/09/24:  UBE x 8 mins 90 RPM forward only RW4   yellow tband 2x  15 each way US  combo x 10 mins to RT shldr lateral deltoid 1.5 w/cm2 Manual STW to RT shldr musculature with focus on posterior cuff x 10 mins   HMP and IFC at 80-150 Hz on 40% scan x 15 minutes. Normal modality resposne following removal of modality.    PATIENT EDUCATION: Education details: Discussed shoulder anatomy.   Person educated: Patient Education method: Explanation  HOME  EXERCISE PROGRAM:   ASSESSMENT:  CLINICAL IMPRESSION: Pt arrives with very little pain. Fatigued with therabar exercises. Continued shoulder strengthening with combined rotational movements such as with PNF patterns and ball circles. Pt is progressing well.   OBJECTIVE IMPAIRMENTS: decreased activity tolerance, decreased ROM, decreased strength, increased muscle spasms, and pain.   ACTIVITY LIMITATIONS: carrying, lifting, and reach over head  PARTICIPATION LIMITATIONS: meal prep, cleaning, laundry, and yard work  PERSONAL FACTORS: Time since onset of injury/illness/exacerbation are also affecting patient's functional outcome.   REHAB POTENTIAL: Good  CLINICAL DECISION MAKING: Evolving/moderate complexity  EVALUATION COMPLEXITY: Low   GOALS:  SHORT TERM GOALS: Target date: 08/13/24.  Ind with an initial HEP. Goal status:  MET   LONG TERM GOALS: Target date: 09/10/24  Ind with an advanced HEP.  Goal status: IN PROGRESS  2.  Active right shoulder flexion to 145 degrees so the patient can easily reach overhead.   10/20: 132 degrees Goal status: IN PROGRESS  3.  Increase ROM so patient is able to reach behind back to L3.  Goal status: MET  4.  Increase right shoulder strength to a solid 5/5 to increase stability for performance of functional activities.   10/20: 5/5 in all directions except abduction Goal status: IN PROGRESS  5.  Perform ADL's with pain not > 3/10.   10/20: 4/10 Goal status: IN PROGRESS  6.  Improve Quick DASH score by at least 5 points. 10/20: 31.8%  Goal status: IN PROGRESS  PLAN:  PT FREQUENCY: 2x/week  PT DURATION: 6 weeks  PLANNED INTERVENTIONS: 97110-Therapeutic exercises, 97530- Therapeutic activity, [id number]- Neuromuscular re-education, 97535- Self Care, 02859- Manual therapy, [id number]- Electrical stimulation (unattended), 97016- Vasopneumatic device, [id number]- Ultrasound, 79439 (1-2 muscles), 20561 (3+ muscles)- Dry Needling, Patient/Family  education, Cryotherapy, and Moist heat  PLAN FOR NEXT SESSION: Combo e'stim, STW/M, UE Ranger, PROM. RW4, PRE's.  Modalities as needed.     Jerod Mcquain April Ma L Jeanelle Dake, PT 08/21/2024, 3:14 PM

## 2024-08-27 ENCOUNTER — Ambulatory Visit: Admitting: *Deleted

## 2024-08-30 ENCOUNTER — Encounter: Payer: Self-pay | Admitting: *Deleted

## 2024-08-30 ENCOUNTER — Ambulatory Visit: Attending: Orthopedic Surgery | Admitting: *Deleted

## 2024-08-30 DIAGNOSIS — G8929 Other chronic pain: Secondary | ICD-10-CM | POA: Diagnosis present

## 2024-08-30 DIAGNOSIS — M25511 Pain in right shoulder: Secondary | ICD-10-CM | POA: Insufficient documentation

## 2024-08-30 DIAGNOSIS — M62838 Other muscle spasm: Secondary | ICD-10-CM | POA: Diagnosis present

## 2024-08-30 DIAGNOSIS — M25611 Stiffness of right shoulder, not elsewhere classified: Secondary | ICD-10-CM | POA: Insufficient documentation

## 2024-08-30 NOTE — Therapy (Signed)
 OUTPATIENT PHYSICAL THERAPY SHOULDER TREATMENT   Patient Name: Stacy [name] MRN: [id number] DOB:Mar 05, 1956, 68 y.o., female Today's Date: 08/30/2024  END OF SESSION:  PT End of Session - 08/30/24 1026     Visit Number 8    Number of Visits 12    Date for Recertification  09/10/24    PT Start Time 1015    PT Stop Time 1102    PT Time Calculation (min) 47 min           Past Medical History:  Diagnosis Date   Acute on chronic respiratory failure (HCC) 12/09/2022   Allergic rhinitis 02/25/2008   Qualifier: Diagnosis of  By: Waylan DO, Karen     Angina pectoris 06/09/2021   Aortic atherosclerosis 08/14/2017   Appreciated on CXR 08/14/2017   Asthma    Asthmatic bronchitis 12/07/2021   Atrophy of vagina 12/07/2021   CAP (community acquired pneumonia) 12/08/2022   Centrilobular emphysema (HCC) 05/28/2018   Controlled diabetes mellitus type 2 with complications (HCC) 05/22/2018   Has FLD.   COPD with acute exacerbation (HCC) 11/02/2007   Formatting of this note might be different from the original.  PFT 05/28/2019           ratio 50% FEV1 53% FVC 83%    DLCO 54%  PFT 09/25/18 ([facility]) ratio 52% FEV1 56% FVC 108%; DLCO 69%   Coronary artery disease    Dysphagia 12/07/2021   Dyspnea on exertion 08/21/2018   Elevated LFTs 12/11/2022   Essential hypertension 12/07/2021   Facet arthropathy, lumbar 12/15/2021   Family history of osteoporosis 04/22/2019   Family history of rheumatoid arthritis 06/05/2019   Fatty liver disease, nonalcoholic    Ganglion of hand 12/07/2021   GERD 11/02/2007   Qualifier: Diagnosis of  By: Waylan DO, Karen     Hemoptysis 12/08/2022   Hemorrhoids 12/07/2021   Hyperlipidemia associated with type 2 diabetes mellitus (HCC) 03/24/2020   Hypersomnia with sleep apnea 05/28/2018   HYPERTENSION, BENIGN 12/24/2008   Qualifier: Diagnosis of  By: Pietro, MD, CODY Redell Dimes    Hypothyroidism 06/13/2007   ? Hashimotos.  Has strong family hx autoimmune d/o No  h/o thyroidectomy or radioablation.    IBS (irritable bowel syndrome)    Insomnia- related to menopause 05/28/2018   Lactic acid increased 12/09/2022   Lateral epicondylitis of left elbow 06/05/2019   Lung disease, bullous (HCC) 08/14/2017   Menopause 12/07/2021   Obesity (BMI 30.0-34.9) 05/28/2018   Osteopenia of multiple sites 12/15/2021   Overactive bladder 06/09/2017   Pain in female genitalia on intercourse 12/07/2021   Palpitations 08/03/2022   Polyp of colon 12/07/2021   Post-menopausal 04/22/2019   Precordial pain    Sees Dr Pietro.  Last cardiac cath 2016 = negative. Strong family h/o MI and CAD   Psychophysiological insomnia 05/28/2018   Reactive depression 06/26/2018   Rosacea 08/01/2007   Qualifier: Diagnosis of  By: Waylan DO, Karen     Snoring 05/28/2018   Stage 2 moderate COPD by GOLD classification (HCC) 11/02/2007   Formatting of this note might be different from the original. PFT 05/28/2019           ratio 50% FEV1 53% FVC 83%    DLCO 54% PFT 09/25/18 ([facility]) ratio 52% FEV1 56% FVC 108%; DLCO 69%   Statin intolerance 04/10/2019   Stress incontinence in female 06/09/2017   Type 2 MI (myocardial infarction) (HCC) 12/08/2022   UTI'S, RECURRENT 06/13/2007   Qualifier: Diagnosis of  By:  Bowen DO, Karen     Vitamin D  insufficiency 12/15/2021   Past Surgical History:  Procedure Laterality Date   ABLATION     CARDIAC CATHETERIZATION N/A 06/12/2015   Procedure: Left Heart Cath and Coronary Angiography;  Surgeon: Lonni JONETTA Cash, MD;  Location: [facility] INVASIVE CV LAB;  Service: Cardiovascular;  Laterality: N/A;   CESAREAN SECTION     LTCS     x2   NASAL SINUS SURGERY     SPIROMETRY  01/05/2007   FVC 68% predicted; FEV1 44% predicted; FEV1/FVC 63% predicted = moderate obstruction with low vital capacity possibly due to restriction   SQUAMOUS CELL CARCINOMA EXCISION     TUBAL LIGATION     Patient Active Problem List   Diagnosis Date Noted   Precordial pain  12/26/2022   Elevated LFTs 12/11/2022   Lactic acid increased 12/09/2022   Chronic respiratory failure (HCC) 12/09/2022   Hemoptysis 12/08/2022   Type 2 MI (myocardial infarction) (HCC) 12/08/2022   Palpitations 08/03/2022   Osteopenia of multiple sites 12/15/2021   Vitamin D  insufficiency 12/15/2021   Facet arthropathy, lumbar 12/15/2021   Asthma 12/07/2021   Coronary artery disease 12/07/2021   Atrophy of vagina 12/07/2021   Dysphagia 12/07/2021   Ganglion of hand 12/07/2021   Hemorrhoids 12/07/2021   Polyp of colon 12/07/2021   Asthmatic bronchitis 12/07/2021   Pain in female genitalia on intercourse 12/07/2021   Essential hypertension 12/07/2021   Menopause 12/07/2021   Angina pectoris 06/09/2021   Fatty liver disease, nonalcoholic 06/07/2021   IBS (irritable bowel syndrome) 06/07/2021   Hyperlipidemia associated with type 2 diabetes mellitus (HCC) 03/24/2020   Family history of rheumatoid arthritis 06/05/2019   Lateral epicondylitis of left elbow 06/05/2019   Family history of osteoporosis 04/22/2019   Statin intolerance 04/10/2019   Dyspnea on exertion 08/21/2018   Reactive depression 06/26/2018   Insomnia- related to menopause 05/28/2018   Snoring 05/28/2018   Hypersomnia with sleep apnea 05/28/2018   Centrilobular emphysema (HCC) 05/28/2018   Obesity (BMI 30.0-34.9) 05/28/2018   Psychophysiological insomnia 05/28/2018   Controlled diabetes mellitus type 2 with complications (HCC) 05/22/2018   Lung disease, bullous (HCC) 08/14/2017   Aortic atherosclerosis 08/14/2017   Stress incontinence in female 06/09/2017   Overactive bladder 06/09/2017   HYPERTENSION, BENIGN 12/24/2008   Allergic rhinitis 02/25/2008   GERD 11/02/2007   COPD (chronic obstructive pulmonary disease) (HCC) 11/02/2007   PERIMENOPAUSAL SYNDROME 11/02/2007   Rosacea 08/01/2007   Hypothyroidism 06/13/2007   UTI'S, RECURRENT 06/13/2007   REFERRING PROVIDER: Oneil Schillings MD  REFERRING DIAG:  Subacromial impingement of right shoulder.  THERAPY DIAG:  Chronic right shoulder pain  Stiffness of right shoulder, not elsewhere classified  Other muscle spasm  Rationale for Evaluation and Treatment: Rehabilitation  ONSET DATE: ~6 months ago.    SUBJECTIVE:  SUBJECTIVE STATEMENT: Pt states she is doing well with minimal pain now and wants to be put on hold  PERTINENT HISTORY: Please see above.    PAIN:  Are you having pain? No  PRECAUTIONS: None  RED FLAGS: None   WEIGHT BEARING RESTRICTIONS: No  FALLS:  Has patient fallen in last 6 months? No  LIVING ENVIRONMENT: Lives in: House/apartment Has following equipment at home: None  OCCUPATION: Retired.    PLOF: Independent  PATIENT GOALS:Use right shoulder without pain.    NEXT MD VISIT:   OBJECTIVE:  Note: Objective measures were completed at Evaluation unless otherwise noted.  DIAGNOSTIC FINDINGS:  IMAGING: X-rays right shoulder: No acute osseous abnormality. Well-preserved joint space.  MRI right shoulder: Biceps tenosynovitis. Acromioclavicular arthrosis. Rotator cuff tendinosis, however no discrete tear identified. Signal change consistent with superior labral pathology.  Formal radiology interpretation pending at the time of this dictation.  ASSESSMENT:   1. Subacromial impingement of right shoulder  2. Biceps tendonitis, right   PATIENT SURVEYS:  Quick DASH:  25 points/31.81%  POSTURE: Generally quite good.    UPPER EXTREMITY ROM:   Active right shoulder antigravity flexion is 130 degrees and left is 145 degrees, ER is full and painful at endrange.  Behind back motion ot sacral region.  She experiences referred pain with certain movements.      UPPER EXTREMITY MMT:  Right shoulder abduction is a solid 4/5.   IR/ER tested with elbow by side is essentially normal.    SHOULDER SPECIAL TESTS: (-) Drop Arm test. No significant pain reproduction with impingement testing (Note:  Recent injection was very helpful).    PALPATION:  Tender over right bicipital groove, posterior cuff and middle deltoid.                                                                                                                             TREATMENT DATE:            RT shldr  08/30/24                                  EXERCISE LOG  Exercise Repetitions and Resistance Comments  UBE 10 mins 90 RPM (forward/backward)   Pulleys 5 mins   Seated Ranger    Ball on Wall 3 mins Up/down and then circles   T's and Y'sTB 3x15 each   PNF D2 yellow TB     Therabar Up/Down 3 x 10 reps each   Therabar C    Simulated Driving with red therabar 1 min x 2 reps in U and in n positions   Wall push ups  3x 10-15    Blank cell = exercise not performed today      08/20/24  EXERCISE LOG  Exercise Repetitions and Resistance Comments  UBE 10 mins 90 RPM (forward/backward)   Pulleys 5 mins   Ranger 5 mins   Ball on Wall 2.5 mins   RW 4  Yellow x 25 reps each way   Shoulder Abduction 1# x 25 reps    Therabar Up/Down 20 reps each   Therabar Twists 20 reps each   Simulated Driving 1 min x 2 reps   Goal Assessment     Blank cell = exercise not performed today    Modalities  Date:  Hot Pack: Shoulder, 15 mins, Pain and Tone   08/09/24:  UBE x 8 mins 90 RPM forward only RW4   yellow tband 2x  15 each way US  combo x 10 mins to RT shldr lateral deltoid 1.5 w/cm2 Manual STW to RT shldr musculature with focus on posterior cuff x 10 mins   HMP and IFC at 80-150 Hz on 40% scan x 15 minutes. Normal modality resposne following removal of modality.    PATIENT EDUCATION: Education details: Discussed shoulder anatomy.   Person educated: Patient Education method: Explanation  HOME EXERCISE  PROGRAM:   ASSESSMENT:  CLINICAL IMPRESSION: Pt arrives with very little pain. Pt was able to continue with RT shldr exs as well as review HEP and did well. Red Tband given for HEP. She was able to meet all LTGs except strength at this time. She will be put on hold x 2 weeks and if no flare-ups she will be DC to HEP.   OBJECTIVE IMPAIRMENTS: decreased activity tolerance, decreased ROM, decreased strength, increased muscle spasms, and pain.   ACTIVITY LIMITATIONS: carrying, lifting, and reach over head  PARTICIPATION LIMITATIONS: meal prep, cleaning, laundry, and yard work  PERSONAL FACTORS: Time since onset of injury/illness/exacerbation are also affecting patient's functional outcome.   REHAB POTENTIAL: Good  CLINICAL DECISION MAKING: Evolving/moderate complexity  EVALUATION COMPLEXITY: Low   GOALS:  SHORT TERM GOALS: Target date: 08/13/24.  Ind with an initial HEP. Goal status:  MET   LONG TERM GOALS: Target date: 09/10/24  Ind with an advanced HEP.  Goal status: MET  2.  Active right shoulder flexion to 145 degrees so the patient can easily reach overhead.   10/20: 132 degrees Goal status:  145 degrees   MET  3.  Increase ROM so patient is able to reach behind back to L3.  Goal status: MET  4.  Increase right shoulder strength to a solid 5/5 to increase stability for performance of functional activities.   10/20: 5/5 in all directions except abduction Goal status:   4/5  NM  5.  Perform ADL's with pain not > 3/10.   10/20: 4/10 Goal status: MET  3/10  6.  Improve Quick DASH score by at least 5 points. 10/20: 31.8%  Goal status: MET 18/ 15.9%     PT FREQUENCY: 2x/week  PT DURATION: 6 weeks  PLANNED INTERVENTIONS: 97110-Therapeutic exercises, 97530- Therapeutic activity, 97112- Neuromuscular re-education, 97535- Self Care, [id number]- Manual therapy, [id number]- Electrical stimulation (unattended), [id number]- Vasopneumatic device, [id number]- Ultrasound, 79439 (1-2  muscles), 20561 (3+ muscles)- Dry Needling, Patient/Family education, Cryotherapy, and Moist heat  PLAN : On hold at this time.                    Combo e'stim, STW/M, UE Ranger, PROM. RW4, PRE's.  Modalities as needed.     Leverett Camplin,CHRIS, PTA 08/30/2024, 12:09 PM
